# Patient Record
Sex: Male | Born: 1940 | Race: White | Hispanic: No | Smoking: Former smoker
Health system: Southern US, Community
[De-identification: ages and names within clinical notes are randomized; demographics above are authoritative.]

## PROBLEM LIST (undated history)

## (undated) DIAGNOSIS — N289 Disorder of kidney and ureter, unspecified: Secondary | ICD-10-CM

## (undated) DIAGNOSIS — I5030 Unspecified diastolic (congestive) heart failure: Secondary | ICD-10-CM

## (undated) DIAGNOSIS — E079 Disorder of thyroid, unspecified: Secondary | ICD-10-CM

## (undated) DIAGNOSIS — I739 Peripheral vascular disease, unspecified: Secondary | ICD-10-CM

## (undated) DIAGNOSIS — I499 Cardiac arrhythmia, unspecified: Secondary | ICD-10-CM

## (undated) DIAGNOSIS — F419 Anxiety disorder, unspecified: Secondary | ICD-10-CM

## (undated) DIAGNOSIS — E785 Hyperlipidemia, unspecified: Secondary | ICD-10-CM

## (undated) DIAGNOSIS — F32A Depression, unspecified: Secondary | ICD-10-CM

## (undated) DIAGNOSIS — C4490 Unspecified malignant neoplasm of skin, unspecified: Secondary | ICD-10-CM

## (undated) DIAGNOSIS — I10 Essential (primary) hypertension: Secondary | ICD-10-CM

## (undated) DIAGNOSIS — F329 Major depressive disorder, single episode, unspecified: Secondary | ICD-10-CM

## (undated) DIAGNOSIS — D649 Anemia, unspecified: Secondary | ICD-10-CM

## (undated) HISTORY — PX: AV FISTULA PLACEMENT: SHX1204

---

## 1998-07-08 ENCOUNTER — Encounter: Payer: Self-pay | Admitting: Emergency Medicine

## 1998-07-08 ENCOUNTER — Emergency Department (HOSPITAL_COMMUNITY): Admission: EM | Admit: 1998-07-08 | Discharge: 1998-07-08 | Payer: Self-pay | Admitting: Emergency Medicine

## 2004-06-20 ENCOUNTER — Encounter: Admission: RE | Admit: 2004-06-20 | Discharge: 2004-09-18 | Payer: Self-pay | Admitting: Family Medicine

## 2007-05-19 ENCOUNTER — Ambulatory Visit: Payer: Self-pay | Admitting: Surgery

## 2007-05-22 ENCOUNTER — Ambulatory Visit: Payer: Self-pay | Admitting: Vascular Surgery

## 2007-05-22 ENCOUNTER — Ambulatory Visit (HOSPITAL_COMMUNITY): Admission: RE | Admit: 2007-05-22 | Discharge: 2007-05-22 | Payer: Self-pay | Admitting: Vascular Surgery

## 2007-06-24 ENCOUNTER — Ambulatory Visit: Payer: Self-pay | Admitting: Vascular Surgery

## 2007-08-07 ENCOUNTER — Ambulatory Visit (HOSPITAL_COMMUNITY): Admission: RE | Admit: 2007-08-07 | Discharge: 2007-08-07 | Payer: Self-pay | Admitting: Nephrology

## 2007-08-14 ENCOUNTER — Ambulatory Visit: Payer: Self-pay | Admitting: *Deleted

## 2007-08-26 ENCOUNTER — Ambulatory Visit (HOSPITAL_COMMUNITY): Admission: RE | Admit: 2007-08-26 | Discharge: 2007-08-26 | Payer: Self-pay | Admitting: Vascular Surgery

## 2007-08-26 ENCOUNTER — Ambulatory Visit: Payer: Self-pay | Admitting: Vascular Surgery

## 2007-10-16 ENCOUNTER — Ambulatory Visit: Payer: Self-pay | Admitting: Vascular Surgery

## 2007-10-16 ENCOUNTER — Ambulatory Visit (HOSPITAL_COMMUNITY): Admission: RE | Admit: 2007-10-16 | Discharge: 2007-10-16 | Payer: Self-pay | Admitting: Vascular Surgery

## 2007-11-27 ENCOUNTER — Ambulatory Visit: Payer: Self-pay | Admitting: *Deleted

## 2007-12-02 ENCOUNTER — Ambulatory Visit: Payer: Self-pay | Admitting: Vascular Surgery

## 2007-12-02 ENCOUNTER — Ambulatory Visit (HOSPITAL_COMMUNITY): Admission: RE | Admit: 2007-12-02 | Discharge: 2007-12-02 | Payer: Self-pay | Admitting: Vascular Surgery

## 2008-02-05 ENCOUNTER — Ambulatory Visit: Payer: Self-pay | Admitting: *Deleted

## 2008-03-02 ENCOUNTER — Ambulatory Visit (HOSPITAL_COMMUNITY): Admission: RE | Admit: 2008-03-02 | Discharge: 2008-03-02 | Payer: Self-pay | Admitting: *Deleted

## 2008-03-02 ENCOUNTER — Ambulatory Visit: Payer: Self-pay | Admitting: *Deleted

## 2008-05-20 ENCOUNTER — Ambulatory Visit: Payer: Self-pay | Admitting: Vascular Surgery

## 2008-05-20 ENCOUNTER — Ambulatory Visit (HOSPITAL_COMMUNITY): Admission: RE | Admit: 2008-05-20 | Discharge: 2008-05-20 | Payer: Self-pay | Admitting: Vascular Surgery

## 2010-06-12 LAB — POCT I-STAT 4, (NA,K, GLUC, HGB,HCT)
Glucose, Bld: 124 mg/dL — ABNORMAL HIGH (ref 70–99)
HCT: 42 % (ref 39.0–52.0)
Potassium: 4.2 mEq/L (ref 3.5–5.1)
Sodium: 141 mEq/L (ref 135–145)

## 2010-06-27 ENCOUNTER — Ambulatory Visit (HOSPITAL_COMMUNITY): Payer: Self-pay | Attending: Nephrology

## 2010-07-11 ENCOUNTER — Ambulatory Visit (HOSPITAL_COMMUNITY): Payer: Self-pay | Attending: Nephrology

## 2010-07-11 NOTE — Procedures (Signed)
CEPHALIC VEIN MAPPING   INDICATION:  Evaluate cephalic vein for fistula.  Left hand dominant.   HISTORY:  ESRD.   EXAM:  The right cephalic vein is compressible.   Diameter measurements range from 24-55 cm.   The left cephalic vein not evaluated.   Diameter measurements range from   See attached worksheet for all measurements.   IMPRESSION:  Patent right cephalic vein which is of acceptable diameter  for use as a dialysis access site from the level of the distal forearm  to the proximal arm.   ___________________________________________  V. Charlena Cross, MD   PB/MEDQ  D:  05/19/2007  T:  05/19/2007  Job:  [id number]

## 2010-07-11 NOTE — Op Note (Signed)
NAMECLEOTHA, TSANG             ACCOUNT NO.:  [id number]   MEDICAL RECORD NO.:  [id number]          PATIENT TYPE:  AMB   LOCATION:  SDS                          FACILITY:  [facility]   PHYSICIAN:  Larina Earthly, M.D.    DATE OF BIRTH:  06-20-1940   DATE OF PROCEDURE:  08/26/2007  DATE OF DISCHARGE:  08/26/2007                               OPERATIVE REPORT   PREOPERATIVE DIAGNOSIS:  Right atriovenous fistula for hemodialysis  access with competing branches.   POSTOPERATIVE DIAGNOSIS:  Right atriovenous fistula for hemodialysis  access with competing branches.   PROCEDURE:  Ligation of 2 competing branches of right arm atriovenous  fistula.   SURGEON:  Larina Earthly, MD   ASSISTANT:  Nurse.   ANESTHESIA:  MAC.   COMPLICATIONS:  None.   DISPOSITION:  To recovery room, stable.   INDICATIONS FOR PROCEDURE:  The patient was taken to the operating room,  placed in supine position.  The right arm was prepped and draped in the  usual sterile fashion.  Using preoperative sonogram for guidance, the  competing branches were identified using duplex.  Using local  anesthesia, a  small 1-cm incision was made over the fistula at 2  different place and the competing venous branches were isolated.  These  were ligated with 3-0 silk ties.  The wounds were irrigated with saline.  Hemostasis with electrocautery.  The wounds were closed 3-0 Vicryl in  the subcutaneous and subcuticular tissue.  Benzoin and Steri-Strips were  applied.      Larina Earthly, M.D.  Electronically Signed     TFE/MEDQ  D:  08/26/2007  T:  08/27/2007  Job:  [id number]

## 2010-07-11 NOTE — Op Note (Signed)
NAMEGEOVANY, TRUDO             ACCOUNT NO.:  [id number]   MEDICAL RECORD NO.:  [id number]          PATIENT TYPE:  AMB   LOCATION:  SDS                          FACILITY:  [facility]   PHYSICIAN:  Quita Skye. Hart Rochester, M.D.  DATE OF BIRTH:  02-20-1941   DATE OF PROCEDURE:  10/16/2007  DATE OF DISCHARGE:  10/16/2007                               OPERATIVE REPORT   PREOPERATIVE DIAGNOSIS:  Migration of Diatek catheter, right internal  jugular vein with no infection.   POSTOPERATIVE DIAGNOSIS:  Migration of Diatek catheter, right internal  jugular vein with no infection.   OPERATIONS:  1. Removal of Diatek catheter, right internal jugular vein.  2. Insertion of new Diatek catheter, right internal jugular vein (28      cm).   SURGEON:  Quita Skye. Hart Rochester, M.D.   FIRST ASSISTANT:  Nurse.   ANESTHESIA:  Local.   PROCEDURE:  The patient was taken to the operating room and placed in a  supine position, at which time right upper chest and neck were prepped  with Betadine scrub and solution draped in routine sterile manner.  The  catheter which exited in the infraclavicular area on the right side was  also prepped into the field.  A portion of the cuff had become exposed  at the exit site, and the patient was to have this replaced to prevent  infection.  After infiltration with 1% Xylocaine, a short transverse  incision was made in the supraclavicular area over the catheter.  Catheter was then exposed and grasped with a hemostat.  The catheter  transected distally.  A guidewire passed centrally through the catheter  into the inferior vena cava under fluoroscopic guidance and the old  catheter removed over the guidewire.  A new 28-cm Diatek catheter was  passed through the peel-away sheath, positioned in the right atrium,  tunneled peripherally into a different area, secured with nylon sutures,  and the wound closed with Vicryl in subcuticular fashion.  Attention was  then turned to the exit  site, which was infiltrated with Xylocaine and  the cuff was completely exposed, and the remainder of the old catheter  completely removed including old cuff.  There was no evidence of any  infection.  After this, the exit site was closed in subcuticular fashion  with Vicryl.  Sterile dressing applied.  The patient was taken to the  recovery room in satisfactory condition.      Quita Skye Hart Rochester, M.D.  Electronically Signed     JDL/MEDQ  D:  10/16/2007  T:  10/17/2007  Job:  [id number]

## 2010-07-11 NOTE — Op Note (Signed)
Chris Vance, Chris Vance             ACCOUNT NO.:  [id number]   MEDICAL RECORD NO.:  [id number]          PATIENT TYPE:  AMB   LOCATION:  SDS                          FACILITY:  [facility]   PHYSICIAN:  Balinda Quails, M.D.    DATE OF BIRTH:  Sep 13, 1940   DATE OF PROCEDURE:  03/02/2008  DATE OF DISCHARGE:  03/02/2008                               OPERATIVE REPORT   SURGEON:  Balinda Quails, MD   ASSISTANT:  RNFA.   ANESTHESIA:  Local with MAC.   PREOPERATIVE DIAGNOSES:  1. End-stage renal failure.  2. Poorly maturing right Cimino arteriovenous fistula.   POSTOPERATIVE DIAGNOSES:  1. End-stage renal failure.  2. Poorly maturing right Cimino arteriovenous fistula.   PROCEDURE:  Revision of right Cimino arteriovenous fistula.   OPERATIVE PROCEDURE:  The patient was brought to the operating room in  stable condition.  Placed in supine position.  Right arm prepped and  draped in a sterile fashion.   Using a sterile ultrasound probe, right arm arteriovenous fistula was  imaged.  Two large draining veins were identified in the mid forearm  area.  Cut-down was performed after instillation of 1% Xylocaine.  Draining veins were ligated with 3-0 silk.   At completion, the fistula was patent.  The incisions were closed with  interrupted 3-0 Vicryl suture in the subcutaneous layer and Monocryl and  Dermabond to the skin.      Balinda Quails, M.D.  Electronically Signed     PGH/MEDQ  D:  03/02/2008  T:  03/03/2008  Job:  [id number]

## 2010-07-11 NOTE — Op Note (Signed)
Chris Vance, Chris Vance             ACCOUNT NO.:  [id number]   MEDICAL RECORD NO.:  [id number]          PATIENT TYPE:  AMB   LOCATION:  SDS                          FACILITY:  [facility]   PHYSICIAN:  Larina Earthly, M.D.    DATE OF BIRTH:  23-Nov-1940   DATE OF PROCEDURE:  05/22/2007  DATE OF DISCHARGE:                               OPERATIVE REPORT   PREOPERATIVE DIAGNOSIS:  End-stage renal disease.   POSTOPERATIVE DIAGNOSIS:  End-stage renal disease.   PROCEDURE:  1. Right wrist Cimino AV fistula.  2. Right IJ Diatek catheter with ultrasound visualization.   SURGEON:  Gretta Began, MD.   ASSISTANT:  Iva Boop, PA-C.   ANESTHESIA:  MAC.   COMPLICATIONS:  None.   DISPOSITION:  Recovery room stable.   PROCEDURE IN DETAIL:  The patient was taken to the recovery room, placed  in the supine position.  The area of the right arm, right wrist prepped  and draped in the usual sterile fashion.  An incision was made between  the level of the cephalic vein and the radial artery.  The cephalic vein  extended over to the dorsum of the wrist and the vein had a separate  small incision over the vein to mobilize this and expose this.  The vein  was ligated distally, divided and brought into approximation with the  radial artery through a separate incision at the wrist.  The radial  artery was occluded proximally and distally with an 11-blade and  extended with Potts scissors.  The cephalic vein was cut to the  appropriate dimensions, length and was spatulated and sewn end-to-side  of the artery with a #6-0 Prolene suture.  Clamps were removed and good  flow was noted.  The wounds were irrigated with saline.  Hemostasis with  electrocautery.  Wounds were closed with #3-0 Vicryl in the subcutaneous  and subcuticular tissue and Steri-Strips were applied.  Next, the right  and left neck were imaged with ultrasound, revealing widely patent  jugular veins bilaterally.  The patient was placed in  Trendelenburg  position and the right and left neck, chest prepped and draped in the  usual sterile fashion.  Using local anesthesia and a finder needle, the  right internal jugular vein was accessed.  Next, using the Seldinger  technique a guide wire was passed down to the level of the right atrium.  The dilator and peel-away sheath was passed over the guidewire.  A  dilator and peel-away sheath was placed and the dilator and guidewire  were removed.  The 28 cm Diatek catheter was passed through the vein to  the level of the right atrium as confirmed by fluoroscopy.  The catheter  was brought through a subcutaneous tunnel and the two lumen ports were  attached.  Both lumens flushed and aspirated easily and were locked with  1000 units of  heparin.  The catheter was secured to the skin with #3-0  nylon stitch.  The entry site was closed with #4-0 subcuticular Vicryl  stitch.  A sterile dressing was applied and the patient was taken to the  recovery room in stable condition.      Larina Earthly, M.D.  Electronically Signed     TFE/MEDQ  D:  05/22/2007  T:  05/22/2007  Job:  [id number]

## 2010-07-11 NOTE — Assessment & Plan Note (Signed)
OFFICE VISIT   [name], [name]  DOB:  May 23, 1940                                       06/24/2007  AVWUJ#:[id number]   The patient underwent creation of a right forearm AV fistula (Cimino) by  Dr. Arbie Cookey on March 26.  The fistula is functioning nicely with an  excellent pulse and palpable thrill up to the antecubital area.  There  is no symptoms of steal.  He has some mild numbness in the thumb which  is improving.  The Diatek catheter is functioning well in the right  internal jugular vein.  He was reassured regarding this and in 2 months  they will attempt to utilize the fistula to see if it is functional for  dialysis.   Quita Skye Hart Rochester, M.D.  Electronically Signed   JDL/MEDQ  D:  06/24/2007  T:  06/25/2007  Job:  [id number]

## 2010-07-11 NOTE — Procedures (Signed)
VASCULAR LAB EXAM   INDICATION:  Follow up immature AV fistula.   HISTORY:  Diabetes:  Yes.  Cardiac:  No.  Hypertension:  Yes.   EXAM:  Duplex of the right AV fistula.   IMPRESSION:  Three branches noted off the right cephalic vein (AV  fistula).   ___________________________________________  P. Liliane Bade, M.D.   MG/MEDQ  D:  02/05/2008  T:  02/05/2008  Job:  [id number]

## 2010-07-11 NOTE — Op Note (Signed)
NAME:  Chris Vance, Chris Vance             ACCOUNT NO.:  [id number]   MEDICAL RECORD NO.:  [id number]          PATIENT TYPE:  AMB   LOCATION:  SDS                          FACILITY:  [facility]   PHYSICIAN:  Larina Earthly, M.D.    DATE OF BIRTH:  June 23, 1940   DATE OF PROCEDURE:  DATE OF DISCHARGE:                               OPERATIVE REPORT   PREOPERATIVE DIAGNOSIS:  Poorly maturing right arteriovenous fistula  with stenosis at arteriovenous anastomosis.   POSTOPERATIVE DIAGNOSIS:  Poorly maturing right arteriovenous fistula  with stenosis at arteriovenous anastomosis.   PROCEDURE:  1. Revision of arteriovenous anastomosis of right wrist AV fistula.  2. Ligation of competing branch of AV fistula.   SURGEON:  Larina Earthly, MD   ASSISTANT:  Nurse.   ANESTHESIA:  MAC.   COMPLICATIONS:  None.   DISPOSITION:  To recovery room, stable.   PROCEDURE IN DETAIL:  The patient was taken to the operating room,  placed in supine position.  The area of the right arm was prepped and  draped in a sterile fashion.  The patient had a preoperative ultrasound,  which showed a severe stenosis at the AV anastomosis.  Using local  anesthesia, incision was made to the prior incision at the wrist,  carried down to dissect the AV anastomosis.  The radial artery was  exposed further proximally and the old AV anastomosis was ligated near  the arteriovenous fistula.  The cephalic vein was divided.  The radial  artery was occluded proximal and distally, slightly proximal to the  prior AV anastomosis and the artery was opened with #11 blade extended  longitudinally using Potts scissors.  The cephalic vein was spatulated  and sewn end-to-side to the artery with a running 6-0 Prolene suture.  Clamps were removed and good thrill was noted through the vein.  Next,  the vein was reimaged with ultrasound and this did show a branch in the  midforearm with some flow into the tributary branch.  The incision was  made  over this with local anesthesia and the competing branch was  ligated with 3-0 silk tie.  The wounds were irrigated with saline.  Hemostasis achieved with cautery.  Wounds were closed with 3-0 Vicryl  subcutaneous and subcuticular tissues.  Benzoin and Steri-Strips were  applied.     Larina Earthly, M.D.  Electronically Signed    TFE/MEDQ  D:  12/02/2007  T:  12/02/2007  Job:  [id number]

## 2010-07-11 NOTE — Assessment & Plan Note (Signed)
OFFICE VISIT   Chris Vance, Chris Vance  DOB:  [birth year]                                       05/19/2007  YNWGN#:[id number]   REASON FOR VISIT:  Evaluate for dialysis access.   HISTORY:  This is a 70 year old gentleman seen at the request of Dr.  Hyman Hopes for evaluation of permanent dialysis access.  The patient has a  history of diabetes, hypertension as well as hypercholesterolemia.  He  is left-handed.   REVIEW OF SYSTEMS:  GENERAL:  Positive for loss of appetite.  CARDIAC:  Negative.  PULMONARY:  Negative.  GI:  Positive for reflux.  GU:  Positive for kidney disease and frequent urination.  VASCULAR:  Pain in legs with walking and when lying flat.  NEURO:  Positive for dizziness.  ORTHO:  Negative.  PSYCH:  Positive for depression.  ENT:  Negative.  HEME:  Negative.   PAST MEDICAL HISTORY:  1. Diabetes.  2. Hypertension.  3. Hypercholesterolemia.   FAMILY HISTORY:  Negative.   SOCIAL HISTORY:  1. He is single.  2. Currently he smokes about a 1 pack a day.  3. Does not drink alcohol.   PHYSICAL EXAMINATION:  Heart rate 69, blood pressure 120/62,  respirations 18.  General:  He is well-appearing, in no acute distress.  HEENT:  Normocephalic, atraumatic.  Cardiovascular:  Regular rate and  rhythm.  Pulmonary:  Clear to auscultation.  Extremities:  His right arm  has a palpable radial pulse.  Extremities:  Warm and well perfused.  Psych:  He is alert and oriented x3.   Vein mapping was performed today.  I think he has adequate right  systolic vein.   ASSESSMENT AND PLAN:  This is a 70 year old left-handed gentleman with  chronic kidney disease soon to require dialysis.  He comes in for  dialysis mapping, for dialysis planning.  I think he is a candidate for  a right cephalic vein fistula at the level of the wrist.  I discussed  with him approximately 80% maturity rate.  We discussed the risks and  benefits of the operation including steal,  infection, bleeding.  Patient  is going to call us with his OR time.   Jorge Ny, MD  Electronically Signed   VWB/MEDQ  D:  05/19/2007  T:  05/20/2007  Job:  [id number]   cc:   Garnetta Buddy, M.D.

## 2010-07-11 NOTE — Procedures (Signed)
VASCULAR LAB EXAM   INDICATION:  Right Cimino fistula placed 05/22/2007 by Dr. Arbie Cookey.  Two  branches of the cephalic vein were ligated on 16/11/9602 and yet the  fistula still failed to mature.   HISTORY:  Diabetes:  Yes.  Cardiac:  No.  Hypertension:  Yes.   EXAM:  Duplex of right Cimino fistula.   IMPRESSION:  1. No obvious right brachial/radial inflow stenosis.  2. Peak systolic velocity of 927 cm/second at the fistula.  3. There is a large branch of the cephalic vein at the antecubital      fossa that courses medially and communicates with the deep system.  4. The mid upper arm cephalic vein measures 0.47 cm in diameter.  5. Velocities suggest severe stenosis at the anastomosis of the      fistula.   ___________________________________________  P. Liliane Bade, M.D.   MC/MEDQ  D:  11/27/2007  T:  11/27/2007  Job:  [id number]

## 2010-07-11 NOTE — Assessment & Plan Note (Signed)
OFFICE VISIT   Chris Vance, Chris Vance  DOB:  19-Aug-1940                                       11/27/2007  JWJXB#:[id number]   The patient is an end-stage renal failure patient on hemodialysis  Monday, Wednesday and Friday.  He had a right Cimino AV fistula  initially placed 05/22/2007 with revision of the fistula 08/26/2007 both  procedures carried out by Dr. Arbie Cookey.  The fistula has failed to mature.  Duplex scan reveals a severe stenosis at the arteriovenous anastomosis  with velocities of 927 cm/sec.   Blood pressure is 173/78, pulse is 80 per minute.  The fistula is  patent, although not adequately matured.   Will schedule for revision of the arteriovenous anastomosis as the vein  does appear adequate on duplex and with improved inflow should mature  well.   Balinda Quails, M.D.  Electronically Signed   PGH/MEDQ  D:  11/27/2007  T:  11/29/2007  Job:  [id number]

## 2010-07-11 NOTE — Assessment & Plan Note (Signed)
OFFICE VISIT   Chris Vance, Chris Vance  DOB:  August 10, 1940                                       02/05/2008  [id number]#:[id number]   The patient is an end-stage renal failure patient with hemodialysis  Monday, Wednesday, Friday.  Right Cimino AV fistula initially created  05/22/2007 with revision of fistula 08/26/2007 and 12/02/2007.  Continues to have some failure to mature.   Duplex scan reveals a competing vein in the right mid forearm and also a  large vein draining from the cephalic to the basilic in the antecubital  fossa.   Will plan revision 03/02/2008 at [facility].   Balinda Quails, M.D.  Electronically Signed   PGH/MEDQ  D:  02/05/2008  T:  02/06/2008  Job:  [id number]

## 2010-07-11 NOTE — Assessment & Plan Note (Signed)
OFFICE VISIT   Chris Vance, Chris Vance  DOB:  [birth year]                                       08/14/2007  OZHYQ#:[id number]   The patient is a 70 year old gentleman with a history of diabetes,  hypertension and end-stage renal failure.  He has a right internal  jugular Diatek catheter in place for hemodialysis Monday, Wednesday and  Friday.  He had a right Cimino arteriovenous fistula created by Dr.  Arbie Cookey on 05/22/2007.  This, however, has failed to mature adequately for  hemodialysis.   He underwent a shuntogram on 08/07/2007 and on review of this there are  several competing veins noted in the distal right forearm.  It does  appear that with ligation of these there may be significant improvement  in the flow into the main cephalic vein of the fistula.  The patient  takes no anticoagulants.   MEDICATIONS:  His medications include insulin, Prilosec, labetalol,  amlodipine, PhosLo, Dialyvite and lisinopril.   PHYSICAL EXAMINATION:  General:  On evaluation he is a generally well-  appearing 70 year old male.  No acute distress.  Vital signs:  BP is  176/80, pulse 82 per minute.  His right Cimino arteriovenous fistula is  patent.  The areas of competing veins can actually be palpated fairly  easily in his right forearm.   With dialysis Monday, Wednesday, Friday I will set him up for Dr. Arbie Cookey  for a revision of his AV fistula to be performed 08/26/2007 at [facility].  Please put a copy of this dictation on Dr. Bosie Helper desk.   Balinda Quails, M.D.  Electronically Signed   PGH/MEDQ  D:  08/14/2007  T:  08/15/2007  Job:  [id number]

## 2010-11-20 LAB — POCT I-STAT 4, (NA,K, GLUC, HGB,HCT)
Glucose, Bld: 74
HCT: 29 — ABNORMAL LOW
Hemoglobin: 9.9 — ABNORMAL LOW
Potassium: 3.7
Sodium: 142

## 2010-11-23 LAB — POCT I-STAT 4, (NA,K, GLUC, HGB,HCT): Hemoglobin: 15

## 2010-11-27 LAB — POCT I-STAT 4, (NA,K, GLUC, HGB,HCT)
HCT: 40
Hemoglobin: 13.6
Potassium: 4.5
Sodium: 135

## 2010-11-29 ENCOUNTER — Emergency Department (HOSPITAL_COMMUNITY): Payer: Medicare Other

## 2010-11-29 ENCOUNTER — Emergency Department (HOSPITAL_COMMUNITY)
Admission: EM | Admit: 2010-11-29 | Discharge: 2010-11-29 | Disposition: A | Discharge status: Home / Self Care | Payer: Medicare Other | Attending: Emergency Medicine | Admitting: Emergency Medicine

## 2010-11-29 DIAGNOSIS — Z794 Long term (current) use of insulin: Secondary | ICD-10-CM | POA: Insufficient documentation

## 2010-11-29 DIAGNOSIS — R5383 Other fatigue: Secondary | ICD-10-CM | POA: Insufficient documentation

## 2010-11-29 DIAGNOSIS — R5381 Other malaise: Secondary | ICD-10-CM | POA: Insufficient documentation

## 2010-11-29 DIAGNOSIS — G510 Bell's palsy: Secondary | ICD-10-CM | POA: Insufficient documentation

## 2010-11-29 DIAGNOSIS — Z79899 Other long term (current) drug therapy: Secondary | ICD-10-CM | POA: Insufficient documentation

## 2010-11-29 DIAGNOSIS — R29810 Facial weakness: Secondary | ICD-10-CM | POA: Insufficient documentation

## 2010-11-29 LAB — COMPREHENSIVE METABOLIC PANEL
ALT: 15 U/L (ref 0–53)
AST: 15 U/L (ref 0–37)
CO2: 34 mEq/L — ABNORMAL HIGH (ref 19–32)
Chloride: 94 mEq/L — ABNORMAL LOW (ref 96–112)
Creatinine, Ser: 3.21 mg/dL — ABNORMAL HIGH (ref 0.50–1.35)
GFR calc non Af Amer: 18 mL/min — ABNORMAL LOW (ref >90)
Total Bilirubin: 0.4 mg/dL (ref 0.3–1.2)

## 2010-11-29 LAB — DIFFERENTIAL
Basophils Absolute: 0 10*3/uL (ref 0.0–0.1)
Basophils Relative: 1 % (ref 0–1)
Eosinophils Relative: 2 % (ref 0–5)
Monocytes Absolute: 0.4 10*3/uL (ref 0.1–1.0)
Monocytes Relative: 6 % (ref 3–12)

## 2010-11-29 LAB — CBC
HCT: 28.7 % — ABNORMAL LOW (ref 39.0–52.0)
MCH: 31.4 pg (ref 26.0–34.0)
MCHC: 36.2 g/dL — ABNORMAL HIGH (ref 30.0–36.0)
RDW: 17.4 % — ABNORMAL HIGH (ref 11.5–15.5)

## 2010-11-30 ENCOUNTER — Other Ambulatory Visit: Payer: Self-pay | Admitting: Nephrology

## 2010-11-30 DIAGNOSIS — G510 Bell's palsy: Secondary | ICD-10-CM

## 2010-11-30 DIAGNOSIS — R270 Ataxia, unspecified: Secondary | ICD-10-CM

## 2010-12-05 ENCOUNTER — Ambulatory Visit
Admission: RE | Admit: 2010-12-05 | Discharge: 2010-12-05 | Disposition: A | Discharge status: Home / Self Care | Payer: Medicare Other | Source: Ambulatory Visit | Attending: Nephrology | Admitting: Nephrology

## 2010-12-05 DIAGNOSIS — G510 Bell's palsy: Secondary | ICD-10-CM

## 2010-12-05 DIAGNOSIS — R270 Ataxia, unspecified: Secondary | ICD-10-CM

## 2011-06-29 ENCOUNTER — Other Ambulatory Visit: Payer: Self-pay | Admitting: Neurology

## 2011-06-29 DIAGNOSIS — H4920 Sixth [abducent] nerve palsy, unspecified eye: Secondary | ICD-10-CM

## 2011-06-29 DIAGNOSIS — G510 Bell's palsy: Secondary | ICD-10-CM

## 2011-06-29 DIAGNOSIS — H8309 Labyrinthitis, unspecified ear: Secondary | ICD-10-CM

## 2011-10-08 ENCOUNTER — Emergency Department (HOSPITAL_COMMUNITY)
Admission: EM | Admit: 2011-10-08 | Discharge: 2011-10-08 | Disposition: A | Discharge status: Home / Self Care | Payer: Medicare Other | Attending: Emergency Medicine | Admitting: Emergency Medicine

## 2011-10-08 ENCOUNTER — Encounter (HOSPITAL_COMMUNITY): Payer: Self-pay | Admitting: *Deleted

## 2011-10-08 ENCOUNTER — Emergency Department (HOSPITAL_COMMUNITY): Payer: Medicare Other

## 2011-10-08 DIAGNOSIS — I120 Hypertensive chronic kidney disease with stage 5 chronic kidney disease or end stage renal disease: Secondary | ICD-10-CM | POA: Insufficient documentation

## 2011-10-08 DIAGNOSIS — R569 Unspecified convulsions: Secondary | ICD-10-CM

## 2011-10-08 DIAGNOSIS — Z79899 Other long term (current) drug therapy: Secondary | ICD-10-CM | POA: Insufficient documentation

## 2011-10-08 DIAGNOSIS — Z794 Long term (current) use of insulin: Secondary | ICD-10-CM | POA: Insufficient documentation

## 2011-10-08 DIAGNOSIS — F411 Generalized anxiety disorder: Secondary | ICD-10-CM | POA: Insufficient documentation

## 2011-10-08 DIAGNOSIS — E785 Hyperlipidemia, unspecified: Secondary | ICD-10-CM | POA: Insufficient documentation

## 2011-10-08 DIAGNOSIS — C4490 Unspecified malignant neoplasm of skin, unspecified: Secondary | ICD-10-CM | POA: Insufficient documentation

## 2011-10-08 DIAGNOSIS — D649 Anemia, unspecified: Secondary | ICD-10-CM | POA: Insufficient documentation

## 2011-10-08 DIAGNOSIS — N186 End stage renal disease: Secondary | ICD-10-CM | POA: Insufficient documentation

## 2011-10-08 DIAGNOSIS — F17200 Nicotine dependence, unspecified, uncomplicated: Secondary | ICD-10-CM | POA: Insufficient documentation

## 2011-10-08 DIAGNOSIS — F419 Anxiety disorder, unspecified: Secondary | ICD-10-CM | POA: Insufficient documentation

## 2011-10-08 HISTORY — DX: Essential (primary) hypertension: I10

## 2011-10-08 HISTORY — DX: Disorder of thyroid, unspecified: E07.9

## 2011-10-08 HISTORY — DX: Unspecified malignant neoplasm of skin, unspecified: C44.90

## 2011-10-08 HISTORY — DX: Anemia, unspecified: D64.9

## 2011-10-08 HISTORY — DX: Hyperlipidemia, unspecified: E78.5

## 2011-10-08 HISTORY — DX: Disorder of kidney and ureter, unspecified: N28.9

## 2011-10-08 HISTORY — DX: Anxiety disorder, unspecified: F41.9

## 2011-10-08 LAB — BASIC METABOLIC PANEL
Calcium: 9 mg/dL (ref 8.4–10.5)
GFR calc non Af Amer: 14 mL/min — ABNORMAL LOW (ref >90)
Sodium: 138 mEq/L (ref 135–145)

## 2011-10-08 LAB — CBC WITH DIFFERENTIAL/PLATELET
Basophils Absolute: 0 10*3/uL (ref 0.0–0.1)
Eosinophils Absolute: 0.1 10*3/uL (ref 0.0–0.7)
Eosinophils Relative: 1 % (ref 0–5)
MCH: 31.1 pg (ref 26.0–34.0)
MCV: 87.2 fL (ref 78.0–100.0)
Platelets: 240 10*3/uL (ref 150–400)
RDW: 14.6 % (ref 11.5–15.5)

## 2011-10-08 NOTE — ED Notes (Signed)
Pt was outside waiting for ride after finishing hemodialysis. Witnessed grand mal seizure lasting approx 1 min while in a sitting position. No injuries. Pt A&OX4 upon EMS arrival.

## 2011-10-08 NOTE — ED Notes (Signed)
Spoke with Velna Hatchet at [facility] who was present for some of incident. She reported that bystanders ran in to building stating that pt was having a seizure & shaking all over. Upon arrival outside of building she did not see any seizure activity, pt was then assisted to ground from bench. She stated that pt was post ictal, that he was unresponsive, not speaking, not following commands & was drooling which lasted 6-7 minutes. He then "came to", was talking but was confused. She stated that pt was only 2.5 over his dry weight (usually 4) therefore did not pull as much from him as usual. She also reported that his initial BP was 170/100

## 2011-10-08 NOTE — ED Notes (Addendum)
Reports after dialysis felt weak, but states always does. States he sat on bench, smoked a cigarette (always does) then passed out. Was reported to pt that he slumped over on bench & was assisted down to groung & seizure like activity lasted approx 15 seconds. No oral trauma, no urinary incontinence. Denies heart racing, palpitations, CP or any injuries. No post ictal phase reported.

## 2011-10-08 NOTE — ED Notes (Signed)
Pt arrived via EMS with dialysis graft accessed by kidney center. Was clamped off PTA. IV team paged & notified of need to de-access graft. Spoke with Trula Ore, RN with IV team

## 2011-10-08 NOTE — ED Notes (Signed)
Pt d/c home with instructions to follow up with Neurologist for further testing. Pt reports feeling weak due to dialysis earlier today. Denies pain. Denies SOB. A.O. X 4. Skin warm, dry, and intact. No further questions at this time. NAD.

## 2011-10-08 NOTE — ED Notes (Signed)
Patient transported to CT 

## 2011-10-08 NOTE — ED Notes (Signed)
IV team at bedside to access dialysis port.

## 2011-10-08 NOTE — ED Notes (Signed)
Pt. Sitting up in bed resting. Family at bedside. Denies pain, dizziness, SOB. Reports weakness, "I also feel weak after dialysis. This is nothing different". A.O. X 4. NAD. No further needs at this time.

## 2011-10-08 NOTE — ED Provider Notes (Signed)
History     CSN: [id number]  Arrival date & time 10/08/11  1657   First MD Initiated Contact with Patient 10/08/11 1718      Chief Complaint  Patient presents with  . Seizures    (Consider location/radiation/quality/duration/timing/severity/associated sxs/prior treatment) HPI Comments: Chris Vance is a 71 y.o. Male who is here for evaluation of possible seizure. The patient had completed dialysis, was sitting on bench, and slumped over with a shaking of his whole body. Bystanders assisted him to the ground. EMS arrived, and transported him here. EMS gave report that bystanders report 60 seconds of shaking, followed by immediate resolution of all symptoms, including return of consciousness. He had been unconscious while shaking. The patient remembers awakening on the ground with people standing over him. He did not bite his tongue or lose continence of urine, and bowels. Apparently, the dialysis nurse, accessed his right forearm fistula, after the event; and, possibly gave him fluids.  He has had no recent illnesses. He ate breakfast prior to dialysis today. He has never had a seizure. He had an evaluation 2 months ago for a possible stroke. He was told that he did not have a stroke. There are no aggravating or palliative factors.  Patient is a 71 y.o. male presenting with seizures. The history is provided by the patient.  Seizures     Past Medical History  Diagnosis Date  . Renal disorder   . Diabetes mellitus   . Hypertension   . Anemia   . Hyperlipidemia   . Thyroid disease   . Anxiety   . Skin cancer     History reviewed. No pertinent past surgical history.  History reviewed. No pertinent family history.  History  Substance Use Topics  . Smoking status: Current Everyday Smoker  . Smokeless tobacco: Not on file  . Alcohol Use: No      Review of Systems  Neurological: Positive for seizures.  All other systems reviewed and are negative.    Allergies  Ace  inhibitors and Lisinopril  Home Medications   Current Outpatient Rx  Name Route Sig Dispense Refill  . ALBUTEROL SULFATE HFA 108 (90 BASE) MCG/ACT IN AERS Inhalation Inhale 2 puffs into the lungs every 6 (six) hours as needed. For wheezing/shortness of breath    . AMLODIPINE BESYLATE 10 MG PO TABS Oral Take 10 mg by mouth at bedtime.    . ASPIRIN EC 81 MG PO TBEC Oral Take 81 mg by mouth daily.    Marland Kitchen DIALYVITE 3000 3 MG PO TABS Oral Take 1 tablet by mouth daily.    . FOSINOPRIL SODIUM 40 MG PO TABS Oral Take 40 mg by mouth at bedtime.    . INSULIN GLARGINE 100 UNIT/ML [place] SOLN Subcutaneous Inject 58 Units into the skin at bedtime.    Marland Kitchen LABETALOL HCL 200 MG PO TABS Oral Take 200 mg by mouth 2 (two) times daily.    Marland Kitchen LIDOCAINE-PRILOCAINE 2.5-2.5 % EX CREA Topical Apply 1 application topically 3 (three) times a week. Dialysis med    . OMEPRAZOLE 20 MG PO CPDR Oral Take 20 mg by mouth daily.    Marland Kitchen POLYVINYL ALCOHOL 1.4 % OP SOLN Both Eyes Place 1 drop into both eyes 2 (two) times daily.    Marland Kitchen PREDNISONE 10 MG PO TABS Oral Take 10 mg by mouth daily.    Marland Kitchen ROPINIROLE HCL 0.25 MG PO TABS Oral Take 0.25 mg by mouth at bedtime.      BP  153/58  Pulse 88  Temp 97.9 F (36.6 C) (Oral)  Resp 16  SpO2 99%  Physical Exam  Nursing note and vitals reviewed. Constitutional: He is oriented to person, place, and time. He appears well-developed and well-nourished.  HENT:  Head: Normocephalic and atraumatic.  Right Ear: External ear normal.  Left Ear: External ear normal.       No tongue abrasion  Eyes: Conjunctivae and EOM are normal. Pupils are equal, round, and reactive to light.  Neck: Normal range of motion and phonation normal. Neck supple.  Cardiovascular: Normal rate, regular rhythm, normal heart sounds and intact distal pulses.        Right forearm fistula, with normal thrill,  Pulmonary/Chest: Effort normal and breath sounds normal. He exhibits no bony tenderness.  Abdominal: Soft. Normal  appearance. There is no tenderness.  Musculoskeletal: Normal range of motion. He exhibits no edema and no tenderness.  Neurological: He is alert and oriented to person, place, and time. He has normal strength. No cranial nerve deficit or sensory deficit. He exhibits normal muscle tone. Coordination normal.  Skin: Skin is warm, dry and intact.  Psychiatric: He has a normal mood and affect. His behavior is normal. Judgment and thought content normal.    ED Course  Procedures (including critical care time)  Emergency Department nurse contacted the nurse, who evaluated the patient outside of his dialysis unit. She found him immediately after the shaking stopped, he was postictal for 7 or 8 minutes. His blood pressure was hypertensive. She did not notice any trauma. He had actually had a shortened dialysis today, because he did not need a lot of fluid  taken off.  Reeval: 23:25- patient is comfortable. Repeat vital signs, normal. No seizure activity, emergency department. He is calm, cooperative, and lucid.    Date: 10/08/2011  Rate: 72  Rhythm: normal sinus rhythm  QRS Axis: normal  Intervals: normal  ST/T Wave abnormalities: normal  Conduction Disutrbances:nonspecific intraventricular conduction delay  Narrative Interpretation:   Old EKG Reviewed: unchanged     Labs Reviewed  CBC WITH DIFFERENTIAL - Abnormal; Notable for the following:    WBC 11.4 (*)     RBC 3.60 (*)     Hemoglobin 11.2 (*)     HCT 31.4 (*)     Neutrophils Relative 82 (*)     Neutro Abs 9.3 (*)     Lymphocytes Relative 9 (*)     All other components within normal limits  BASIC METABOLIC PANEL - Abnormal; Notable for the following:    Chloride 93 (*)     Glucose, Bld 122 (*)     Creatinine, Ser 4.06 (*)     GFR calc non Af Amer 14 (*)     GFR calc Af Amer 16 (*)     All other components within normal limits   Ct Head Wo Contrast  10/08/2011  *RADIOLOGY REPORT*  Clinical Data: Syncope, seizures.  CT HEAD  WITHOUT CONTRAST  Technique:  Contiguous axial images were obtained from the base of the skull through the vertex without contrast.  Comparison: 11/29/2010  Findings: Atherosclerotic and physiologic intracranial calcifications.  Retention cyst or polyp in the left maxillary sinus. Diffuse parenchymal atrophy. Patchy areas of hypoattenuation in deep and periventricular white matter bilaterally. Negative for acute intracranial hemorrhage, mass lesion, acute infarction, midline shift, or mass-effect. Acute infarct may be inapparent on noncontrast CT. Ventricles and sulci symmetric. Bone windows demonstrate no focal lesion.  IMPRESSION:  1. Negative for bleed  or other acute intracranial process.  2. Atrophy and nonspecific white matter changes  Original Report Authenticated By: Thora Lance III, M.D.     1. Seizure       MDM  End stage renal patient presents with normal to elevated blood pressure, and history of loss of consciousness, with possible shaking. Bystander history is reasonable for seizure. No history of epilepsy or evident propagating physical or lab findings. Patient stable for discharge with outpatient management.       Plan: Home Medications- usual; Home Treatments- rest, avoid stress; Recommended follow up- Neurology    Flint Melter, MD 10/08/11 [phone]

## 2011-10-16 ENCOUNTER — Ambulatory Visit (HOSPITAL_COMMUNITY)
Admission: RE | Admit: 2011-10-16 | Discharge: 2011-10-16 | Disposition: A | Discharge status: Home / Self Care | Payer: Medicare Other | Source: Ambulatory Visit | Attending: Nephrology | Admitting: Nephrology

## 2011-10-16 DIAGNOSIS — E119 Type 2 diabetes mellitus without complications: Secondary | ICD-10-CM | POA: Insufficient documentation

## 2011-10-16 DIAGNOSIS — F17200 Nicotine dependence, unspecified, uncomplicated: Secondary | ICD-10-CM | POA: Insufficient documentation

## 2011-10-16 DIAGNOSIS — I10 Essential (primary) hypertension: Secondary | ICD-10-CM | POA: Insufficient documentation

## 2011-10-16 DIAGNOSIS — E785 Hyperlipidemia, unspecified: Secondary | ICD-10-CM | POA: Insufficient documentation

## 2011-10-16 DIAGNOSIS — R031 Nonspecific low blood-pressure reading: Secondary | ICD-10-CM | POA: Insufficient documentation

## 2011-10-16 NOTE — Progress Notes (Signed)
*  PRELIMINARY RESULTS* Echocardiogram 2D Echocardiogram has been performed.  Jeryl Columbia 10/16/2011, 12:03 PM

## 2012-06-05 ENCOUNTER — Ambulatory Visit (INDEPENDENT_AMBULATORY_CARE_PROVIDER_SITE_OTHER)
Admission: RE | Admit: 2012-06-05 | Discharge: 2012-06-05 | Disposition: A | Discharge status: Home / Self Care | Payer: Medicare Other | Source: Ambulatory Visit | Attending: Internal Medicine | Admitting: Internal Medicine

## 2012-06-05 ENCOUNTER — Encounter: Payer: Self-pay | Admitting: Internal Medicine

## 2012-06-05 ENCOUNTER — Ambulatory Visit (INDEPENDENT_AMBULATORY_CARE_PROVIDER_SITE_OTHER): Payer: Medicare Other | Admitting: Internal Medicine

## 2012-06-05 VITALS — BP 142/64 | HR 82 | Temp 98.3°F | Ht 70.0 in | Wt 175.6 lb

## 2012-06-05 DIAGNOSIS — F17200 Nicotine dependence, unspecified, uncomplicated: Secondary | ICD-10-CM

## 2012-06-05 DIAGNOSIS — R0600 Dyspnea, unspecified: Secondary | ICD-10-CM

## 2012-06-05 DIAGNOSIS — R0609 Other forms of dyspnea: Secondary | ICD-10-CM

## 2012-06-05 DIAGNOSIS — R0989 Other specified symptoms and signs involving the circulatory and respiratory systems: Secondary | ICD-10-CM

## 2012-06-05 NOTE — Assessment & Plan Note (Addendum)
-   06/05/2012  Walked RA x 2 laps @ 185 ft each stopped due to sob but no desat - Spirometry 06/05/2012  FEV1 1.71 (50%) ratio 74  So he has no evidence of airflow obst and symptoms are markedly disproportionate to objective findings and not clear this is a lung problem but pt does appear to have difficult airway management issues.   DDX of  difficult airways managment all start with A and  include Adherence, Ace Inhibitors, Acid Reflux, Active Sinus Disease, Alpha 1 Antitripsin deficiency, Anxiety masquerading as Airways dz,  ABPA,  allergy(esp in young), Aspiration (esp in elderly), Adverse effects of DPI,  Active smokers, plus two Bs  = Bronchiectasis and Beta blocker use....and one C= CHF   ? Acei effects > he's better since d/c in fact his cc changed from cough at time of referral so sob by time of ov   ? Beta blocker effect > in absence of airflow obst clinically or by pft's it's prob ok to continue labetolol  ? Chf/ vol overload not supported by cxr or fact that Sundays are not his worse days    .

## 2012-06-05 NOTE — Patient Instructions (Addendum)
The key is to stop smoking completely before smoking completely stops you!   I will communicate with Dr Gerri Lins re your blood pressure medications as your normodyne (labetolol) may need to be changed at some point in the future but for now it's ok to continue as you do not appear to have significant copd or asthma but if your breathing worsens it may need to be changed   Please remember to go to the   x-ray department downstairs for your tests - we will call you with the results when they are available.

## 2012-06-05 NOTE — Progress Notes (Signed)
Quick Note:  Spoke with pt and notified of results per Dr. Wert. Pt verbalized understanding and denied any questions.  ______ 

## 2012-06-05 NOTE — Progress Notes (Signed)
  Subjective:    Patient ID: Chris Vance, male    DOB: [birth year] MRN: [id number]  HPI  18 yowm active smoker with esrf HD dep since 2009  referred by Dr Pearletha Forge for new onset cough/sob x 2012.   06/05/2012 1st pulmonary eval/ [name] cc doe x sev years worse on off days from HD but HD is MWF and Sundays are not his worst days.  Doe x unloading groceries and mailbox and back always recovers sitting still, plus attacks at hs ? better on days when has hd - no better p saba.  Was having a lot of cough but better since acei d/c'd by Dr C.  No obvious daytime variabilty or assoc  cp or chest tightness, subjective wheeze overt sinus or hb symptoms. No unusual exp hx or h/o childhood pna/ asthma or premature birth to his knowledge.   Also denies any obvious fluctuation of symptoms with weather or environmental changes or other aggravating or alleviating factors except as outlined above   Review of Systems  Constitutional: Negative for fever, chills, activity change, appetite change and unexpected weight change.  HENT: Negative for congestion, sore throat, rhinorrhea, sneezing, trouble swallowing, dental problem, voice change and postnasal drip.   Eyes: Negative for visual disturbance.  Respiratory: Positive for shortness of breath. Negative for cough and choking.   Cardiovascular: Negative for chest pain and leg swelling.  Gastrointestinal: Negative for nausea, vomiting and abdominal pain.  Genitourinary: Negative for difficulty urinating.  Musculoskeletal: Negative for arthralgias.  Skin: Negative for rash.  Psychiatric/Behavioral: Negative for behavioral problems and confusion.       Objective:   Physical Exam  amb wf with classic voice fatigue.   Wt Readings from Last 3 Encounters:  06/05/12 175 lb 9.6 oz (79.652 kg)  HEENT: nl dentition, turbinates, and orophanx. Nl external ear canals without cough reflex   NECK :  without JVD/Nodes/TM/ nl carotid upstrokes  bilaterally   LUNGS: no acc muscle use, clear to A and P bilaterally without cough on insp or exp maneuvers   CV:  RRR  no s3 or murmur or increase in P2, no edema   ABD:  soft and nontender with nl excursion in the supine position. No bruits or organomegaly, bowel sounds nl  MS:  warm without deformities, calf tenderness, cyanosis or clubbing/ shunt R forearm  SKIN: warm and dry without lesions    NEURO:  alert, approp, no deficits    CXR  06/05/2012 :  No acute finding. Stable compared prior exam.        Assessment & Plan:

## 2012-06-05 NOTE — Assessment & Plan Note (Signed)
I took an extended  opportunity with this patient to outline the consequences of continued cigarette use  in airway disorders based on all the data we have from the multiple national lung health studies (perfomed over decades at millions of dollars in cost)  indicating that smoking cessation, not choice of inhalers or physicians, is the most important aspect of care.   

## 2012-10-09 ENCOUNTER — Ambulatory Visit
Admission: RE | Admit: 2012-10-09 | Discharge: 2012-10-09 | Disposition: A | Discharge status: Home / Self Care | Payer: Medicare Other | Source: Ambulatory Visit | Attending: Cardiology | Admitting: Cardiology

## 2012-10-09 ENCOUNTER — Other Ambulatory Visit: Payer: Self-pay | Admitting: Cardiology

## 2012-10-09 DIAGNOSIS — R0989 Other specified symptoms and signs involving the circulatory and respiratory systems: Secondary | ICD-10-CM

## 2012-11-11 ENCOUNTER — Encounter: Payer: Medicare Other | Admitting: Vascular Surgery

## 2012-12-08 ENCOUNTER — Encounter: Payer: Self-pay | Admitting: Vascular Surgery

## 2012-12-09 ENCOUNTER — Encounter: Payer: Self-pay | Admitting: Vascular Surgery

## 2012-12-09 ENCOUNTER — Ambulatory Visit (INDEPENDENT_AMBULATORY_CARE_PROVIDER_SITE_OTHER): Payer: Medicare Other | Admitting: Vascular Surgery

## 2012-12-09 VITALS — BP 109/57 | HR 108 | Resp 16 | Ht 70.0 in | Wt 183.3 lb

## 2012-12-09 DIAGNOSIS — N186 End stage renal disease: Secondary | ICD-10-CM

## 2012-12-09 NOTE — Progress Notes (Signed)
[facility]   Patient name: Chris Vance MRN: [id number] DOB: Jun 29, 1940 Sex: male   Referred by: Cristela Felt  Reason for referral:  Chief Complaint  Patient presents with  . Follow-up    established renal pt.  referal from Dr. Arrie Aran    possible blockage of right subclavian artery    HISTORY OF PRESENT ILLNESS: Patient is well-known to me from prior AV fistula creation in 2009. He had several additional treatment for ligation of competing branches but does have excellent use of his fistula for nearly 5 years. Recently he underwent carotid duplex for evaluation of asymptomatic right carotid bruit. I have reviewed and shows no evidence of significant carotid stenosis. He does show right proximal subclavian artery or stenosis and retrograde flow in his right vertebral artery. The patient denies any symptoms of arm ischemia on the right and denies any symptoms of vertebrobasilar insufficiency. He does have a functioning right AV fistula and reports that he has very good flow and has had no recent issues with the fistula.  Past Medical History  Diagnosis Date  . Renal disorder   . Diabetes mellitus   . Hypertension   . Anemia   . Hyperlipidemia   . Thyroid disease   . Anxiety   . Skin cancer     Past Surgical History  Procedure Laterality Date  . Av fistula placement      History   Social History  . Marital Status: Divorced    Spouse Name: N/A    Number of Children: N/A  . Years of Education: N/A   Occupational History  . Not on file.   Social History Main Topics  . Smoking status: Current Every Day Smoker -- 1.00 packs/day for 55 years    Types: Cigarettes    Start date: 02/26/1957  . Smokeless tobacco: Never Used  . Alcohol Use: No  . Drug Use: No  . Sexual Activity: Not on file   Other Topics Concern  . Not on file   Social History Narrative  . No narrative on file    Family History  Problem Relation Age of Onset  .  Asthma Father     Allergies as of 12/09/2012 - Review Complete 12/09/2012  Allergen Reaction Noted  . Ace inhibitors  10/08/2011  . Lisinopril Cough 10/08/2011    Current Outpatient Prescriptions on File Prior to Visit  Medication Sig Dispense Refill  . calcium acetate, Phos Binder, (PHOSLYRA) 667 MG/5ML SOLN 3 tablets daily with meals      . insulin glargine (LANTUS) 100 UNIT/ML injection Inject 58 Units into the skin at bedtime.      Marland Kitchen labetalol (NORMODYNE) 200 MG tablet Take 100 mg by mouth 2 (two) times daily. Takes 1/2 tablet twice daily.      Marland Kitchen lidocaine-prilocaine (EMLA) cream Apply 1 application topically 3 (three) times a week. Dialysis med      . omeprazole (PRILOSEC) 20 MG capsule Take 20 mg by mouth daily.       No current facility-administered medications on file prior to visit.     REVIEW OF SYSTEMS:  Positives indicated with an "X"  CARDIOVASCULAR:  [ ]  chest pain   [ ]  chest pressure   [ ]  palpitations   [ ]  orthopnea   [ ]  dyspnea on exertion   [x ] claudication   [ ]  rest pain   [ ]  DVT   [ ]  phlebitis PULMONARY:   [ ]  productive cough   [ ]   asthma   [ ]  wheezing NEUROLOGIC:   [ ]  weakness  [ ]  paresthesias  [ ]  aphasia  [ ]  amaurosis  [ ]  dizziness HEMATOLOGIC:   [ ]  bleeding problems   [ ]  clotting disorders MUSCULOSKELETAL:  [ ]  joint pain   [ ]  joint swelling GASTROINTESTINAL: [ ]   blood in stool  [ ]   hematemesis GENITOURINARY:  [ ]   dysuria  [ ]   hematuria PSYCHIATRIC:  [ ]  history of major depression INTEGUMENTARY:  [ ]  rashes  [ ]  ulcers CONSTITUTIONAL:  [ ]  fever   [ ]  chills  PHYSICAL EXAMINATION:  General: The patient is a well-nourished male, in no acute distress. Vital signs are BP 109/57  Pulse 108  Resp 16  Ht 5\' 10"  (1.778 m)  Wt 183 lb 4.8 oz (83.144 kg)  BMI 26.3 kg/m2 Pulmonary: There is a good air exchange bilaterally without wheezing or rales. Abdomen: Soft and non-tender with normal pitch bowel sounds. Musculoskeletal: There are  no major deformities.  There is no significant extremity pain. Neurologic: No focal weakness or paresthesias are detected, Skin: There are no ulcer or rashes noted. Psychiatric: The patient has normal affect. Cardiovascular: 2+ left radial pulse. He has a very large well-developed fistula on the right with excellent thrill from the wrist proximally.    Vascular Lab Studies:  The duplex was outside lab was reviewed and again shows no evidence of significant carotid stenosis and does show right proximal subclavian artery or occlusion with retrograde flow in the right vertebral artery  Impression and Plan:  Asymptomatic right subclavian stenosis. I discussed this at length with the patient and since he is having no symptoms of arm ischemia or vertebrobasilar insufficiency and is having no issues with his AV fistula, we would recommend observation only. He was pleased with his results and will see [place] on an as-needed basis    Ellanora Rayborn [facility] [place] Office: [phone]

## 2012-12-25 ENCOUNTER — Other Ambulatory Visit: Payer: Self-pay | Admitting: *Deleted

## 2012-12-25 DIAGNOSIS — I70219 Atherosclerosis of native arteries of extremities with intermittent claudication, unspecified extremity: Secondary | ICD-10-CM

## 2013-01-26 ENCOUNTER — Encounter: Payer: Self-pay | Admitting: Vascular Surgery

## 2013-01-27 ENCOUNTER — Encounter: Payer: Medicare Other | Admitting: Vascular Surgery

## 2013-01-27 ENCOUNTER — Encounter (HOSPITAL_COMMUNITY): Payer: Medicare Other

## 2013-01-28 ENCOUNTER — Encounter: Payer: Self-pay | Admitting: Vascular Surgery

## 2013-01-29 ENCOUNTER — Emergency Department (HOSPITAL_COMMUNITY): Payer: Medicare Other

## 2013-01-29 ENCOUNTER — Inpatient Hospital Stay (HOSPITAL_COMMUNITY): Admission: RE | Admit: 2013-01-29 | Payer: Medicare Other | Source: Ambulatory Visit

## 2013-01-29 ENCOUNTER — Encounter (HOSPITAL_COMMUNITY): Payer: Self-pay | Admitting: Emergency Medicine

## 2013-01-29 ENCOUNTER — Emergency Department (HOSPITAL_COMMUNITY)
Admission: EM | Admit: 2013-01-29 | Discharge: 2013-01-29 | Disposition: A | Discharge status: Home / Self Care | Payer: Medicare Other | Attending: Emergency Medicine | Admitting: Emergency Medicine

## 2013-01-29 ENCOUNTER — Encounter: Payer: Medicare Other | Admitting: Vascular Surgery

## 2013-01-29 DIAGNOSIS — S5010XA Contusion of unspecified forearm, initial encounter: Secondary | ICD-10-CM | POA: Insufficient documentation

## 2013-01-29 DIAGNOSIS — E119 Type 2 diabetes mellitus without complications: Secondary | ICD-10-CM | POA: Insufficient documentation

## 2013-01-29 DIAGNOSIS — N186 End stage renal disease: Secondary | ICD-10-CM | POA: Insufficient documentation

## 2013-01-29 DIAGNOSIS — Z862 Personal history of diseases of the blood and blood-forming organs and certain disorders involving the immune mechanism: Secondary | ICD-10-CM | POA: Insufficient documentation

## 2013-01-29 DIAGNOSIS — Z85828 Personal history of other malignant neoplasm of skin: Secondary | ICD-10-CM | POA: Insufficient documentation

## 2013-01-29 DIAGNOSIS — R0789 Other chest pain: Secondary | ICD-10-CM

## 2013-01-29 DIAGNOSIS — I120 Hypertensive chronic kidney disease with stage 5 chronic kidney disease or end stage renal disease: Secondary | ICD-10-CM | POA: Insufficient documentation

## 2013-01-29 DIAGNOSIS — F411 Generalized anxiety disorder: Secondary | ICD-10-CM | POA: Insufficient documentation

## 2013-01-29 DIAGNOSIS — S298XXA Other specified injuries of thorax, initial encounter: Secondary | ICD-10-CM | POA: Insufficient documentation

## 2013-01-29 DIAGNOSIS — F17200 Nicotine dependence, unspecified, uncomplicated: Secondary | ICD-10-CM | POA: Insufficient documentation

## 2013-01-29 DIAGNOSIS — Y939 Activity, unspecified: Secondary | ICD-10-CM | POA: Insufficient documentation

## 2013-01-29 DIAGNOSIS — Z79899 Other long term (current) drug therapy: Secondary | ICD-10-CM | POA: Insufficient documentation

## 2013-01-29 DIAGNOSIS — W19XXXA Unspecified fall, initial encounter: Secondary | ICD-10-CM

## 2013-01-29 DIAGNOSIS — W010XXA Fall on same level from slipping, tripping and stumbling without subsequent striking against object, initial encounter: Secondary | ICD-10-CM | POA: Insufficient documentation

## 2013-01-29 DIAGNOSIS — Z794 Long term (current) use of insulin: Secondary | ICD-10-CM | POA: Insufficient documentation

## 2013-01-29 DIAGNOSIS — Y92009 Unspecified place in unspecified non-institutional (private) residence as the place of occurrence of the external cause: Secondary | ICD-10-CM | POA: Insufficient documentation

## 2013-01-29 DIAGNOSIS — Z992 Dependence on renal dialysis: Secondary | ICD-10-CM | POA: Insufficient documentation

## 2013-01-29 LAB — CBC WITH DIFFERENTIAL/PLATELET
Hemoglobin: 11.8 g/dL — ABNORMAL LOW (ref 13.0–17.0)
Lymphocytes Relative: 12 % (ref 12–46)
Lymphs Abs: 1 10*3/uL (ref 0.7–4.0)
MCH: 30.7 pg (ref 26.0–34.0)
Monocytes Relative: 3 % (ref 3–12)
Neutro Abs: 7 10*3/uL (ref 1.7–7.7)
Neutrophils Relative %: 82 % — ABNORMAL HIGH (ref 43–77)
Platelets: 234 10*3/uL (ref 150–400)
RBC: 3.84 MIL/uL — ABNORMAL LOW (ref 4.22–5.81)
WBC: 8.6 10*3/uL (ref 4.0–10.5)

## 2013-01-29 LAB — BASIC METABOLIC PANEL
BUN: 46 mg/dL — ABNORMAL HIGH (ref 6–23)
CO2: 27 mEq/L (ref 19–32)
Chloride: 96 mEq/L (ref 96–112)
GFR calc non Af Amer: 7 mL/min — ABNORMAL LOW (ref >90)
Glucose, Bld: 96 mg/dL (ref 70–99)
Potassium: 5 mEq/L (ref 3.5–5.1)
Sodium: 135 mEq/L (ref 135–145)

## 2013-01-29 MED ORDER — FENTANYL CITRATE 0.05 MG/ML IJ SOLN
50.0000 ug | Freq: Once | INTRAMUSCULAR | Status: AC
  Administered 2013-01-29: 50 ug via INTRAVENOUS
  Filled 2013-01-29: qty 2

## 2013-01-29 MED ORDER — HYDROCODONE-ACETAMINOPHEN 5-325 MG PO TABS
1.0000 | ORAL_TABLET | Freq: Once | ORAL | Status: AC
  Administered 2013-01-29: 1 via ORAL
  Filled 2013-01-29: qty 1

## 2013-01-29 MED ORDER — HYDROCODONE-ACETAMINOPHEN 5-325 MG PO TABS: 1.0000 | ORAL_TABLET | ORAL | Status: DC | PRN

## 2013-01-29 MED ORDER — ONDANSETRON 4 MG PO TBDP
4.0000 mg | ORAL_TABLET | Freq: Once | ORAL | Status: AC
  Administered 2013-01-29: 4 mg via ORAL
  Filled 2013-01-29: qty 1

## 2013-01-29 NOTE — ED Provider Notes (Signed)
CSN: [id number]     Arrival date & time 01/29/13  1209 History   First MD Initiated Contact with Patient 01/29/13 1245     Chief Complaint  Patient presents with  . Fall   (Consider location/radiation/quality/duration/timing/severity/associated sxs/prior Treatment) HPI Comments: 72 year old male with history of end-stage renal disease presenting after a fall. He states he was at his home and tripped on something which caused him to fall to the floor. Sometime later, he went to his dialysis center, thinking that it was Friday (his usual dialysis day).  They've been sent him here for further evaluation. He complains only of right-sided chest wall pain which began after his fall. He denies syncope or loss of consciousness.  Patient is a 72 y.o. male presenting with fall.  Fall This is a new problem. The current episode started 3 to 5 hours ago. Episode frequency: Once. The problem has been resolved. Associated symptoms include chest pain. Pertinent negatives include no abdominal pain and no shortness of breath. The symptoms are aggravated by bending and twisting (Breathing). Nothing relieves the symptoms. He has tried nothing for the symptoms.    Past Medical History  Diagnosis Date  . Renal disorder   . Diabetes mellitus   . Hypertension   . Anemia   . Hyperlipidemia   . Thyroid disease   . Anxiety   . Skin cancer    Past Surgical History  Procedure Laterality Date  . Av fistula placement     Family History  Problem Relation Age of Onset  . Asthma Father    History  Substance Use Topics  . Smoking status: Current Every Day Smoker -- 1.00 packs/day for 55 years    Types: Cigarettes    Start date: 02/26/1957  . Smokeless tobacco: Never Used  . Alcohol Use: No    Review of Systems  Constitutional: Negative for fever.  HENT: Negative for congestion.   Respiratory: Negative for cough and shortness of breath.   Cardiovascular: Positive for chest pain.  Gastrointestinal:  Negative for nausea, vomiting, abdominal pain and diarrhea.  All other systems reviewed and are negative.    Allergies  Ace inhibitors and Lisinopril  Home Medications   Current Outpatient Rx  Name  Route  Sig  Dispense  Refill  . calcium acetate, Phos Binder, (PHOSLYRA) 667 MG/5ML SOLN      3 tablets daily with meals         . gabapentin (NEURONTIN) 300 MG capsule   Oral   Take 300 mg by mouth at bedtime.         . insulin glargine (LANTUS) 100 UNIT/ML injection   Subcutaneous   Inject 58 Units into the skin at bedtime.         Marland Kitchen labetalol (NORMODYNE) 200 MG tablet   Oral   Take 100 mg by mouth 2 (two) times daily. Takes 1/2 tablet twice daily.         Marland Kitchen lidocaine-prilocaine (EMLA) cream   Topical   Apply 1 application topically 3 (three) times a week. Dialysis med         . metoprolol succinate (TOPROL-XL) 50 MG 24 hr tablet   Oral   Take 50 mg by mouth 2 (two) times daily. Take with or immediately following a meal.         . omeprazole (PRILOSEC) 20 MG capsule   Oral   Take 20 mg by mouth daily.          BP 117/64  Pulse 92  Temp(Src) 97.8 F (36.6 C) (Oral)  Resp 20  SpO2 100% Physical Exam  Nursing note and vitals reviewed. Constitutional: He is oriented to person, place, and time. He appears well-developed and well-nourished. No distress.  HENT:  Head: Normocephalic and atraumatic. Head is without raccoon's eyes and without Battle's sign.  Nose: Nose normal.  Eyes: Conjunctivae and EOM are normal. Pupils are equal, round, and reactive to light. No scleral icterus.  Neck: No spinous process tenderness and no muscular tenderness present.  Cardiovascular: Normal rate, regular rhythm, normal heart sounds and intact distal pulses.   No murmur heard. Pulmonary/Chest: Effort normal and breath sounds normal. He has no rales.   He exhibits no tenderness.    Abdominal: Soft. There is no tenderness. There is no rebound and no guarding.    Musculoskeletal: Normal range of motion. He exhibits no edema and no tenderness.       Thoracic back: He exhibits no tenderness and no bony tenderness.       Lumbar back: He exhibits no tenderness and no bony tenderness.  No evidence of trauma to extremities, except as noted.  2+ distal pulses.    Neurological: He is alert and oriented to person, place, and time.  Skin: Skin is warm and dry. No rash noted.  Right forearm fistula present with palpable thrill. There is an ecchymosis on the anterior surface of his forearm associated with this fistula.  Psychiatric: He has a normal mood and affect.    ED Course  Procedures (including critical care time) Labs Review Labs Reviewed  CBC WITH DIFFERENTIAL - Abnormal; Notable for the following:    RBC 3.84 (*)    Hemoglobin 11.8 (*)    HCT 37.4 (*)    RDW 17.0 (*)    Neutrophils Relative % 82 (*)    All other components within normal limits  BASIC METABOLIC PANEL   Imaging Review Dg Chest 2 View  01/29/2013   CLINICAL DATA:  Status post fall with right-sided pain  EXAM: CHEST  2 VIEW  COMPARISON:  June 05, 2012  FINDINGS: The lungs are hyperinflated. There is mild increased pulmonary interstitium. There is no pleural effusion or focal pneumonia. The heart size is enlarged. The aorta is tortuous. The visualized soft tissues and bones demonstrate no acute abnormality.  IMPRESSION: Cardiomegaly.  Mild interstitial edema.   Electronically Signed   By: Sherian Rein M.D.   On: 01/29/2013 14:13  All radiology studies independently viewed by me.     EKG Interpretation    Date/Time:  Thursday January 29 2013 12:17:54 EST Ventricular Rate:  90 PR Interval:    QRS Duration: 104 QT Interval:  358 QTC Calculation: 438 R Axis:   75 Text Interpretation:  Atrial fibrillation Probable LVH with secondary repol abnrm compared to prior, diffuse ST/T changes consistent with repolarization abnormality are now present. Confirmed by [facility]  MD, TREY ([id number])  on 01/29/2013 1:05:50 PM            MDM   1. Fall, initial encounter   2. Chest wall pain    72 year old male status post fall reported as mechanical. No LOC, no syncope. Complains only of right chest wall pain.  He has an ecchymosis overlying his fistula, but reports that there is no new swelling or pain.  Will obtain chest x-ray to evaluate for injuries. Will also obtain blood work because he missed dialysis yesterday.  3:28 PM chest x-ray negative for traumatic injury. Shows mild  edema. However, patient does not feel short of breath and ambulated with out difficulty or hypoxia.  He prefers to be discharged home to followup with dialysis tomorrow.  Candyce Churn, MD 01/29/13 [phone]

## 2013-01-29 NOTE — ED Notes (Signed)
EMS was called to Dialysis center by staff for PT. On arrival info provided about pt was . Chris Vance arrived to dialysis a day early thinking it was Friday . Hi normal dialysis days are M-W-F. There staff reported He missed the WED. Dialysis and could not be reached by Phone. Today Pt reported he fell outside OF [facility] and reports Pain to RT chest area up to RT axilla. Bruising is present . Pt also reported he hit his head but did not pass out. Pt does take coumadin with a HX of A-fib.

## 2013-01-29 NOTE — ED Notes (Signed)
PT instructed on use opf IS. Pt demonstrated usage OF IS

## 2013-01-30 ENCOUNTER — Emergency Department (HOSPITAL_COMMUNITY): Payer: Medicare Other

## 2013-01-30 ENCOUNTER — Inpatient Hospital Stay (HOSPITAL_COMMUNITY)
Admission: EM | Admit: 2013-01-30 | Discharge: 2013-02-17 | DRG: 853 | Disposition: A | Discharge status: Skilled Nursing Facility | Payer: Medicare Other | Attending: Internal Medicine | Admitting: Internal Medicine

## 2013-01-30 ENCOUNTER — Encounter (HOSPITAL_COMMUNITY): Payer: Self-pay | Admitting: Emergency Medicine

## 2013-01-30 DIAGNOSIS — Z79899 Other long term (current) drug therapy: Secondary | ICD-10-CM

## 2013-01-30 DIAGNOSIS — S2249XA Multiple fractures of ribs, unspecified side, initial encounter for closed fracture: Secondary | ICD-10-CM

## 2013-01-30 DIAGNOSIS — E1129 Type 2 diabetes mellitus with other diabetic kidney complication: Secondary | ICD-10-CM | POA: Diagnosis present

## 2013-01-30 DIAGNOSIS — S91302A Unspecified open wound, left foot, initial encounter: Secondary | ICD-10-CM

## 2013-01-30 DIAGNOSIS — IMO0002 Reserved for concepts with insufficient information to code with codable children: Secondary | ICD-10-CM

## 2013-01-30 DIAGNOSIS — R627 Adult failure to thrive: Secondary | ICD-10-CM | POA: Diagnosis present

## 2013-01-30 DIAGNOSIS — I120 Hypertensive chronic kidney disease with stage 5 chronic kidney disease or end stage renal disease: Secondary | ICD-10-CM | POA: Diagnosis present

## 2013-01-30 DIAGNOSIS — R531 Weakness: Secondary | ICD-10-CM

## 2013-01-30 DIAGNOSIS — D631 Anemia in chronic kidney disease: Secondary | ICD-10-CM | POA: Diagnosis present

## 2013-01-30 DIAGNOSIS — F17200 Nicotine dependence, unspecified, uncomplicated: Secondary | ICD-10-CM

## 2013-01-30 DIAGNOSIS — R791 Abnormal coagulation profile: Secondary | ICD-10-CM | POA: Diagnosis present

## 2013-01-30 DIAGNOSIS — A419 Sepsis, unspecified organism: Principal | ICD-10-CM

## 2013-01-30 DIAGNOSIS — F0390 Unspecified dementia without behavioral disturbance: Secondary | ICD-10-CM | POA: Diagnosis present

## 2013-01-30 DIAGNOSIS — I70269 Atherosclerosis of native arteries of extremities with gangrene, unspecified extremity: Secondary | ICD-10-CM | POA: Diagnosis present

## 2013-01-30 DIAGNOSIS — I739 Peripheral vascular disease, unspecified: Secondary | ICD-10-CM | POA: Diagnosis present

## 2013-01-30 DIAGNOSIS — E785 Hyperlipidemia, unspecified: Secondary | ICD-10-CM | POA: Diagnosis present

## 2013-01-30 DIAGNOSIS — Z794 Long term (current) use of insulin: Secondary | ICD-10-CM

## 2013-01-30 DIAGNOSIS — M9080 Osteopathy in diseases classified elsewhere, unspecified site: Secondary | ICD-10-CM | POA: Diagnosis present

## 2013-01-30 DIAGNOSIS — I798 Other disorders of arteries, arterioles and capillaries in diseases classified elsewhere: Secondary | ICD-10-CM | POA: Diagnosis present

## 2013-01-30 DIAGNOSIS — E1142 Type 2 diabetes mellitus with diabetic polyneuropathy: Secondary | ICD-10-CM | POA: Diagnosis present

## 2013-01-30 DIAGNOSIS — Z7901 Long term (current) use of anticoagulants: Secondary | ICD-10-CM | POA: Diagnosis present

## 2013-01-30 DIAGNOSIS — Z888 Allergy status to other drugs, medicaments and biological substances status: Secondary | ICD-10-CM

## 2013-01-30 DIAGNOSIS — S91302D Unspecified open wound, left foot, subsequent encounter: Secondary | ICD-10-CM

## 2013-01-30 DIAGNOSIS — M869 Osteomyelitis, unspecified: Secondary | ICD-10-CM | POA: Diagnosis present

## 2013-01-30 DIAGNOSIS — D649 Anemia, unspecified: Secondary | ICD-10-CM | POA: Diagnosis present

## 2013-01-30 DIAGNOSIS — S91309A Unspecified open wound, unspecified foot, initial encounter: Secondary | ICD-10-CM | POA: Diagnosis present

## 2013-01-30 DIAGNOSIS — W19XXXA Unspecified fall, initial encounter: Secondary | ICD-10-CM | POA: Diagnosis present

## 2013-01-30 DIAGNOSIS — S91001A Unspecified open wound, right ankle, initial encounter: Secondary | ICD-10-CM

## 2013-01-30 DIAGNOSIS — Y92009 Unspecified place in unspecified non-institutional (private) residence as the place of occurrence of the external cause: Secondary | ICD-10-CM

## 2013-01-30 DIAGNOSIS — E1149 Type 2 diabetes mellitus with other diabetic neurological complication: Secondary | ICD-10-CM | POA: Diagnosis present

## 2013-01-30 DIAGNOSIS — R000 Tachycardia, unspecified: Secondary | ICD-10-CM | POA: Diagnosis present

## 2013-01-30 DIAGNOSIS — N390 Urinary tract infection, site not specified: Secondary | ICD-10-CM | POA: Diagnosis present

## 2013-01-30 DIAGNOSIS — E1169 Type 2 diabetes mellitus with other specified complication: Secondary | ICD-10-CM | POA: Diagnosis present

## 2013-01-30 DIAGNOSIS — E875 Hyperkalemia: Secondary | ICD-10-CM

## 2013-01-30 DIAGNOSIS — I471 Supraventricular tachycardia: Secondary | ICD-10-CM | POA: Diagnosis not present

## 2013-01-30 DIAGNOSIS — L97309 Non-pressure chronic ulcer of unspecified ankle with unspecified severity: Secondary | ICD-10-CM | POA: Diagnosis present

## 2013-01-30 DIAGNOSIS — E1159 Type 2 diabetes mellitus with other circulatory complications: Secondary | ICD-10-CM | POA: Diagnosis present

## 2013-01-30 DIAGNOSIS — Z992 Dependence on renal dialysis: Secondary | ICD-10-CM | POA: Diagnosis present

## 2013-01-30 DIAGNOSIS — I428 Other cardiomyopathies: Secondary | ICD-10-CM | POA: Diagnosis present

## 2013-01-30 DIAGNOSIS — N2581 Secondary hyperparathyroidism of renal origin: Secondary | ICD-10-CM | POA: Diagnosis present

## 2013-01-30 DIAGNOSIS — I429 Cardiomyopathy, unspecified: Secondary | ICD-10-CM

## 2013-01-30 DIAGNOSIS — N186 End stage renal disease: Secondary | ICD-10-CM

## 2013-01-30 DIAGNOSIS — G9340 Encephalopathy, unspecified: Secondary | ICD-10-CM | POA: Diagnosis present

## 2013-01-30 DIAGNOSIS — I4891 Unspecified atrial fibrillation: Secondary | ICD-10-CM

## 2013-01-30 DIAGNOSIS — S2231XA Fracture of one rib, right side, initial encounter for closed fracture: Secondary | ICD-10-CM

## 2013-01-30 DIAGNOSIS — Z89511 Acquired absence of right leg below knee: Secondary | ICD-10-CM

## 2013-01-30 DIAGNOSIS — Z85828 Personal history of other malignant neoplasm of skin: Secondary | ICD-10-CM

## 2013-01-30 DIAGNOSIS — L02419 Cutaneous abscess of limb, unspecified: Secondary | ICD-10-CM | POA: Diagnosis present

## 2013-01-30 LAB — COMPREHENSIVE METABOLIC PANEL
ALT: 12 U/L (ref 0–53)
AST: 14 U/L (ref 0–37)
Albumin: 3.4 g/dL — ABNORMAL LOW (ref 3.5–5.2)
CO2: 22 mEq/L (ref 19–32)
Calcium: 9.9 mg/dL (ref 8.4–10.5)
Creatinine, Ser: 8.02 mg/dL — ABNORMAL HIGH (ref 0.50–1.35)
GFR calc non Af Amer: 6 mL/min — ABNORMAL LOW (ref >90)
Glucose, Bld: 184 mg/dL — ABNORMAL HIGH (ref 70–99)
Potassium: 6.9 mEq/L (ref 3.5–5.1)
Sodium: 131 mEq/L — ABNORMAL LOW (ref 135–145)
Total Protein: 7.3 g/dL (ref 6.0–8.3)

## 2013-01-30 LAB — CBC WITH DIFFERENTIAL/PLATELET
Basophils Absolute: 0 10*3/uL (ref 0.0–0.1)
HCT: 41.7 % (ref 39.0–52.0)
Hemoglobin: 13.7 g/dL (ref 13.0–17.0)
Lymphocytes Relative: 5 % — ABNORMAL LOW (ref 12–46)
Lymphs Abs: 0.8 10*3/uL (ref 0.7–4.0)
MCH: 31.6 pg (ref 26.0–34.0)
Neutro Abs: 13.4 10*3/uL — ABNORMAL HIGH (ref 1.7–7.7)
Neutrophils Relative %: 90 % — ABNORMAL HIGH (ref 43–77)
Platelets: 344 10*3/uL (ref 150–400)
RBC: 4.34 MIL/uL (ref 4.22–5.81)
RDW: 16.8 % — ABNORMAL HIGH (ref 11.5–15.5)

## 2013-01-30 LAB — PROTIME-INR
INR: >10 (ref 0.00–1.49)
Prothrombin Time: >90 seconds — ABNORMAL HIGH (ref 11.6–15.2)

## 2013-01-30 LAB — RENAL FUNCTION PANEL
CO2: 19 mEq/L (ref 19–32)
Calcium: 9.7 mg/dL (ref 8.4–10.5)
Chloride: 95 mEq/L — ABNORMAL LOW (ref 96–112)
Creatinine, Ser: 8.18 mg/dL — ABNORMAL HIGH (ref 0.50–1.35)
GFR calc Af Amer: 7 mL/min — ABNORMAL LOW (ref >90)
GFR calc non Af Amer: 6 mL/min — ABNORMAL LOW (ref >90)
Glucose, Bld: 176 mg/dL — ABNORMAL HIGH (ref 70–99)
Sodium: 134 mEq/L — ABNORMAL LOW (ref 135–145)

## 2013-01-30 MED ORDER — SODIUM CHLORIDE 0.9 % IV SOLN
1.0000 g | INTRAVENOUS | Status: AC
  Administered 2013-01-30: 1 g via INTRAVENOUS
  Filled 2013-01-30: qty 10

## 2013-01-30 MED ORDER — METOPROLOL TARTRATE 25 MG PO TABS
50.0000 mg | ORAL_TABLET | Freq: Once | ORAL | Status: AC
  Administered 2013-01-30: 50 mg via ORAL
  Filled 2013-01-30: qty 2

## 2013-01-30 MED ORDER — DOXERCALCIFEROL 4 MCG/2ML IV SOLN
3.0000 ug | INTRAVENOUS | Status: DC
  Administered 2013-02-02 – 2013-02-06 (×3): 3 ug via INTRAVENOUS
  Filled 2013-01-30 (×3): qty 2

## 2013-01-30 MED ORDER — WARFARIN - PHARMACIST DOSING INPATIENT
Freq: Every day | Status: DC
  Administered 2013-01-31 – 2013-02-01 (×2)

## 2013-01-30 MED ORDER — SODIUM CHLORIDE 0.9 % IV SOLN: INTRAVENOUS | Status: DC

## 2013-01-30 MED ORDER — WARFARIN SODIUM 7.5 MG PO TABS
7.5000 mg | ORAL_TABLET | Freq: Once | ORAL | Status: AC
  Administered 2013-01-31: 7.5 mg via ORAL
  Filled 2013-01-30: qty 1

## 2013-01-30 MED ORDER — SODIUM CHLORIDE 0.9 % IV SOLN
INTRAVENOUS | Status: DC
  Administered 2013-01-30: 17:00:00 via INTRAVENOUS

## 2013-01-30 MED ORDER — ONDANSETRON HCL 4 MG/2ML IJ SOLN
4.0000 mg | Freq: Once | INTRAMUSCULAR | Status: AC
  Administered 2013-01-30: 4 mg via INTRAVENOUS
  Filled 2013-01-30: qty 2

## 2013-01-30 NOTE — ED Notes (Signed)
Plan for PT to go to dialysis and INR will be drawn while in dialysis Per order of DR [facility]. Report called to Dialysis. [id number]  RN notified Pt will have a bed request to step down.

## 2013-01-30 NOTE — Consult Note (Signed)
ANTICOAGULATION CONSULT NOTE - Initial Consult  Pharmacy Consult for Coumadin Indication: atrial fibrillation  Allergies  Allergen Reactions  . Ace Inhibitors   . Lisinopril Cough    Vital Signs: Temp: 97.9 F (36.6 C) (12/05 2000) Temp src: Oral (12/05 2000) BP: 125/75 mmHg (12/05 2030) Pulse Rate: 113 (12/05 2030)  Labs:  Recent Labs  01/29/13 1255 01/30/13 1541 01/30/13 1729 01/30/13 2013  HGB 11.8* 13.7  --   --   HCT 37.4* 41.7  --   --   PLT 234 344  --   --   LABPROT  --   --  >90.0* 16.1*  INR  --   --  >10.00* 1.32  CREATININE 7.04* 8.02*  --   --     The CrCl is unknown because both a height and weight (above a minimum accepted value) are required for this calculation.   Medical History: Past Medical History  Diagnosis Date  . Renal disorder   . Diabetes mellitus   . Hypertension   . Anemia   . Hyperlipidemia   . Thyroid disease   . Anxiety   . Skin cancer    Assessment: 72yom on coumadin pta for afib presented to the ED with confusion, rapid afib, and K= 6.9 (missed several HD sessions). Taken for urgent dialysis. Coumadin to resume. Initial INR was > 10, however, repeat INR in HD was subtherapeutic at 1.32 (no reversal agents given, likely lab error).   Home dose = 5mg  daily  Goal of Therapy:   INR 2-3 Monitor platelets by anticoagulation protocol: Yes   Plan:  1) Coumadin 7.5mg  x 1 tonight 2) Daily INR  Fredrik Rigger 01/30/2013,8:43 PM

## 2013-01-30 NOTE — Consult Note (Signed)
Requesting Physician:  IM Teaching Service Reason for Consult:  Management of ESRD and related issues, as well as urgent HD for life threatening hyperkalemia (no dialysis since 01/26/13) HPI: The patient is a 72 y.o. 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  Last dialysis was on Monday.  He did no go on Wednesday, showed up there yesterday on his off day confused and looking disheveled.  Was sent to the ED for evaluation.  CT of the brain was negative. K was 5. Xrays showed some mild interstitial edema.  According to the ED note was "alert and oriented X 3"   Was discharged to home.  Did not show for HD today, dialysis unit sent the police to his home which was full of discarded adult diapers, filth and fleas - door had to be removed to get into the house - pt was brought back to the ED this afternoon. Found to be confused, in rapid afib with rates as high as 150, evidence of local trauma from a fall, wounds on foot nd leg, fractures of 4 ribs on x-ray, potassium of 6.9 and PT-INR of 10 (takes warfarin for atrial fib).  He is admitted by the teaching service for evaluation of his confusion, leg wounds, management of cardiac issues - and is brought up to the dialysis unit for urgent Rx for his hyperkalemia.  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  He presently knows today is Friday, but is not aware of the month and thinks the year is 52.  He doesn't remember that he has only had 1 dialysis treatment this week  Past Medical History  Diagnosis Date  . Renal disorder ESRD on MWF HD [place]   . Diabetes mellitus   . Hypertension   . Anemia   . Hyperlipidemia   . Thyroid disease   . Anxiety   . Skin cancer    Atrial fibrillation on coumadin since 09/2012  Secondary  hyperparathyroidism  Past Surgical History  Procedure Laterality Date  . Av fistula placement right 2009    Family History:  Family History  Problem Relation Age of Onset  . Asthma Father    Social History:  reports that he has been smoking Cigarettes.  He started smoking about 55 years ago. He has a 55 pack-year smoking history. He has never used smokeless tobacco. 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 Ace Inhibitors   . Lisinopril Cough    Home medications: Prior to Admission medications   Medication Sig Start Date End Date Taking? Authorizing Provider  calcium acetate, Phos Binder, (PHOSLYRA) 667 MG/5ML SOLN 3 tablets daily with meals   Yes Historical Provider, MD  gabapentin (NEURONTIN) 300 MG capsule Take 300 mg by mouth at bedtime.   Yes Historical Provider, MD  HYDROcodone-acetaminophen (NORCO/VICODIN) 5-325 MG per tablet Take 1 tablet by mouth every 4 (four) hours as needed. 01/29/13  Yes Candyce Churn, MD  insulin glargine (LANTUS) 100 UNIT/ML injection Inject 58 Units into the skin at bedtime.   Yes Historical Provider, MD  labetalol (NORMODYNE) 200 MG tablet Take 100 mg by mouth 2 (two)  times daily. Takes 1/2 tablet twice daily.   Yes Historical Provider, MD  lidocaine-prilocaine (EMLA) cream Apply 1 application topically 3 (three) times a week. Dialysis med   Yes Historical Provider, MD  metoprolol (LOPRESSOR) 50 MG tablet Take 50 mg by mouth 2 (two) times daily.   Yes Historical Provider, MD  omeprazole (PRILOSEC) 20 MG capsule Take 20 mg by mouth daily.   Yes Historical Provider, MD  warfarin (COUMADIN) 5 MG tablet Take 5 mg by mouth daily.   Yes Historical Provider, MD   Inpatient Medications: None entered as of yet  Review of Systems Denies headache, SOB + right sided chest pain from fall (ribs fractured) Denies fevers/chills/nausea/vomiting +swelling of  lower extremities Acknowledges memory issues and says "I get mixed up" +right leg swelling and redness  Physical Exam:  Blood pressure 128/79, pulse 41, temperature 97.6 F (36.4 C), temperature source Oral, resp. rate 13, SpO2 92.00%. Gen: Disheveled, dirty, malodorous Has what appears to be feces on his feet Skin: erythema esp of RLE Extensive bruising over AVF which has + bruit and thrill, with mid part of AVF difficult to palpate, more distal part aneurysmal Neck:+JVD No neck bruit Chest: anteriorly fairly clear; crackles few at bases Heart: irreg irreg S1S2 no def S3 Tachy 123 Abd - difficult to examine because of sig discomfort right rib cage (from rib fx) BS+ cannot assess liver b/o chest wall pain Ext: Bilat LE edema R>L; feces on feet Cellulitic changes RLE with right malleolar ulcer and multiple smaller punctate scabbed lesions (he says from "briars"); susperficoal lesion bottom of left foot Neuro: alert, oriented to person and day of the week only; "4th month, 1994"   Recent Labs Lab 01/29/13 1255 01/30/13 1541  NA 135 131*  K 5.0 6.9*  CL 96 91*  CO2 27 22  GLUCOSE 96 184*  BUN 46* 60*  CREATININE 7.04* 8.02*  CALCIUM 9.0 9.9    Recent Labs Lab 01/30/13 1541  AST 14  ALT 12  ALKPHOS 141*  BILITOT 1.7*  PROT 7.3  ALBUMIN 3.4*   Recent Labs Lab 01/29/13 1255 01/30/13 1541  WBC 8.6 14.9*  NEUTROABS 7.0 13.4*  HGB 11.8* 13.7  HCT 37.4* 41.7  MCV 97.4 96.1  PLT 234 344   Lab Results  Component Value Date   INR >10.00* 01/30/2013    Xrays/Other Studies: Dg Chest 2 View  01/29/2013   CLINICAL DATA:  Status post fall with right-sided pain  EXAM: CHEST  2 VIEW  COMPARISON:  June 05, 2012  FINDINGS: The lungs are hyperinflated. There is mild increased pulmonary interstitium. There is no pleural effusion or focal pneumonia. The heart size is enlarged. The aorta is tortuous. The visualized soft tissues and bones demonstrate no acute abnormality.   IMPRESSION: Cardiomegaly.  Mild interstitial edema.   Electronically Signed   By: Sherian Rein M.D.   On: 01/29/2013 14:13   Dg Ribs Bilateral W/chest  01/30/2013   CLINICAL DATA:  Fall.  Bilateral rib pain and chest pain  EXAM: BILATERAL RIBS AND CHEST - 4+ VIEW  COMPARISON:  01/29/2013  FINDINGS: Moderate cardiac enlargement. No pleural effusion or edema identified. No airspace consolidation. Chronic interstitial coarsening is identified within the lungs. There is calcified atherosclerotic disease involving the thoracic aorta. There is an acute fracture involving the right 4th rib laterally. Anterior 5th, 6th and 7th rib fractures are also noted. There is no evidence of pneumothorax or pleural effusion.  IMPRESSION: Acute right 4th, 5th, 6th,  and 7th rib fractures.   Electronically Signed   By: Signa Kell M.D.   On: 01/30/2013 16:51   Ct Head Wo Contrast  01/30/2013   CLINICAL DATA:  Fall.  EXAM: CT HEAD WITHOUT CONTRAST  TECHNIQUE: Contiguous axial images were obtained from the base of the skull through the vertex without intravenous contrast.  COMPARISON:  11/29/2010.  FINDINGS: No mass. No hydrocephalus. No hemorrhage. Mild periventricular white matter changes consistent wrist chronic ischemia again noted. Diffuse cerebral cerebellar atrophy again noted. Cerebral vascular calcifications present. Orbits are unremarkable. Minimal mucosal thickening of the paranasal sinuses. Mastoids are clear. No acute bony abnormality. Stable left maxillary bony cyst, most likely odontogenic, no change from prior exam.  IMPRESSION: Stable exam with stable changes of chronic white matter ischemia and cerebral atrophy. No acute abnormality.   Electronically Signed   By: Maisie Fus  Register   On: 01/30/2013 16:42     Outpatient dialysis schedule: MWF [facility]  3 3/4 hours 180NR BFR 450 A1.5 EDW 81 kg 3K2.25 Ca dialysate UF profile 4 Machine temp 36 Access right AVF Needle gauge 15  Meds:  doxercalciferol 3 mcg TIW, heparin 7800 units   Impression/Plan  1. ESRD - MWF HD at [facility]; life-threatening hyperkalemia d/t missed TMT X 4 days; plan urgent HD tonight; 1K bath for 2 hours, 2K for 2 hours; 2 liters off;  recheck K in AM (will need to be changed from 3K to 2K at outpt unit as last 3 outpt K's 4.7-5.3; I note from dialysis records that fistulagram had been ordered to evaluate a drop in access flows - will do this on Monday 2. Hyperkalemia - as above 3. Confusion/AMS - CT in the ED yesterday negative for acute process; further w/u pending; suspect component of dementia; infection may be playing a role; further w/u and ATB's per primary svce 4. Right malleolar ulcer/RLE cellulitis - will need cultures/ATB's/image right ankle to r/o osteo (plan is for vanco and aozyn) 5. Atrial fib with RVR on coumadin with supratherapeutic INR - repeating INR in HD; no heparin with HD; IMTS to manage correction; no idea if he has been taking his beta blocker PTA; metoprolol is on his outpt med list 6. Rib fractures post fall - pain control 7. Secondary hyperparathyroidism - continue hectorol and binders 8. Anemia - Hb 13.7 Hold ESA's 9. Poor social situation - report of law enforcement who extricated patient from his house stated at point of being condemned; cannot return to that living situation; will need case manager and social work involvement   Camille Bal,  MD [employer] [phone] pager 01/30/2013, 7:13 PM

## 2013-01-30 NOTE — ED Provider Notes (Signed)
CSN: [id number]     Arrival date & time 01/30/13  1431 History   First MD Initiated Contact with Patient 01/30/13 1458     Chief Complaint  Patient presents with  . Weakness   (Consider location/radiation/quality/duration/timing/severity/associated sxs/prior Treatment) The history is provided by the patient and the EMS personnel.   patient is 72 year old male brought in by [facility]. Patient was seen yesterday in the emergency department following a fall and was discharged home. Patient is a dialysis patient. Normally dialyzed Monday Wednesday and Friday he was last dialyzed on Monday. Patient was on his way to dialysis today when he felt just too weak to get air dried and self-care so he called EMS. EMS noted that the home had limited access was very messy with trash was soiled diapers everywhere. Patient blood sugar was 179 by them. Patient's heart rate there was 116. Patient has a history of atrial fib. Patient is followed by [facility] for dialysis. Patient is followed by [facility] cardiology. Patient's primary care Dr. is in the [place] area. Patient also with complaint of bilateral chest pain states that that started to hurt when EMS picked him up. Patient's potassium yesterday was 5 patient chest x-ray yesterday was negative. As stated patient was seen in the emergency room Department yesterday following a fall.  Past Medical History  Diagnosis Date  . Renal disorder   . Diabetes mellitus   . Hypertension   . Anemia   . Hyperlipidemia   . Thyroid disease   . Anxiety   . Skin cancer    Past Surgical History  Procedure Laterality Date  . Av fistula placement     Family History  Problem Relation Age of Onset  . Asthma Father    History  Substance Use Topics  . Smoking status: Current Every Day Smoker -- 1.00 packs/day for 55 years    Types: Cigarettes    Start date: 02/26/1957  . Smokeless tobacco: Never Used  . Alcohol Use: No    Review of Systems  Constitutional:  Negative for fever.  HENT: Negative for congestion.   Eyes: Negative for visual disturbance.  Respiratory: Positive for shortness of breath.   Cardiovascular: Positive for chest pain.  Gastrointestinal: Negative for nausea, vomiting and abdominal pain.  Genitourinary: Negative for flank pain.  Musculoskeletal: Negative for back pain.  Skin: Negative for rash.  Neurological: Negative for headaches.  Hematological: Bruises/bleeds easily.  Psychiatric/Behavioral: Negative for confusion.    Allergies  Ace inhibitors and Lisinopril  Home Medications   Current Outpatient Rx  Name  Route  Sig  Dispense  Refill  . calcium acetate, Phos Binder, (PHOSLYRA) 667 MG/5ML SOLN      3 tablets daily with meals         . gabapentin (NEURONTIN) 300 MG capsule   Oral   Take 300 mg by mouth at bedtime.         Marland Kitchen HYDROcodone-acetaminophen (NORCO/VICODIN) 5-325 MG per tablet   Oral   Take 1 tablet by mouth every 4 (four) hours as needed.   15 tablet   0   . insulin glargine (LANTUS) 100 UNIT/ML injection   Subcutaneous   Inject 58 Units into the skin at bedtime.         Marland Kitchen labetalol (NORMODYNE) 200 MG tablet   Oral   Take 100 mg by mouth 2 (two) times daily. Takes 1/2 tablet twice daily.         Marland Kitchen lidocaine-prilocaine (EMLA) cream  Topical   Apply 1 application topically 3 (three) times a week. Dialysis med         . metoprolol (LOPRESSOR) 50 MG tablet   Oral   Take 50 mg by mouth 2 (two) times daily.         Marland Kitchen omeprazole (PRILOSEC) 20 MG capsule   Oral   Take 20 mg by mouth daily.         Marland Kitchen warfarin (COUMADIN) 5 MG tablet   Oral   Take 5 mg by mouth daily.          BP 125/76  Pulse 153  Temp(Src) 97.6 F (36.4 C) (Oral)  Resp 19  SpO2 96% Physical Exam  Nursing note and vitals reviewed. Constitutional: He is oriented to person, place, and time. He appears well-developed and well-nourished. No distress.  HENT:  Head: Normocephalic and atraumatic.   Mouth/Throat: Oropharynx is clear and moist.  Eyes: Conjunctivae and EOM are normal. Pupils are equal, round, and reactive to light.  Neck: Normal range of motion.  Cardiovascular:  No murmur heard. Tachycardic with irregular heart rate.  Pulmonary/Chest: Effort normal and breath sounds normal. No respiratory distress. He exhibits tenderness.  Tender on both sides of the chest. Small bruise to the right side of the chest are round the 6 rib. No crepitance.  Abdominal: Soft. There is no tenderness.  Musculoskeletal: Normal range of motion.  Neurological: He is alert and oriented to person, place, and time. No cranial nerve deficit. He exhibits normal muscle tone. Coordination normal.  Skin: Skin is warm. No rash noted.    ED Course  Procedures (including critical care time) Labs Review Labs Reviewed  COMPREHENSIVE METABOLIC PANEL - Abnormal; Notable for the following:    Sodium 131 (*)    Potassium 6.9 (*)    Chloride 91 (*)    Glucose, Bld 184 (*)    BUN 60 (*)    Creatinine, Ser 8.02 (*)    Albumin 3.4 (*)    Alkaline Phosphatase 141 (*)    Total Bilirubin 1.7 (*)    GFR calc non Af Amer 6 (*)    GFR calc Af Amer 7 (*)    All other components within normal limits  CBC WITH DIFFERENTIAL - Abnormal; Notable for the following:    WBC 14.9 (*)    RDW 16.8 (*)    Neutrophils Relative % 90 (*)    Neutro Abs 13.4 (*)    Lymphocytes Relative 5 (*)    All other components within normal limits   Results for orders placed during the hospital encounter of 01/30/13  COMPREHENSIVE METABOLIC PANEL      Result Value Range   Sodium 131 (*) 135 - 145 mEq/L   Potassium 6.9 (*) 3.5 - 5.1 mEq/L   Chloride 91 (*) 96 - 112 mEq/L   CO2 22  19 - 32 mEq/L   Glucose, Bld 184 (*) 70 - 99 mg/dL   BUN 60 (*) 6 - 23 mg/dL   Creatinine, Ser 1.61 (*) 0.50 - 1.35 mg/dL   Calcium 9.9  8.4 - 09.6 mg/dL   Total Protein 7.3  6.0 - 8.3 g/dL   Albumin 3.4 (*) 3.5 - 5.2 g/dL   AST 14  0 - 37 U/L   ALT  12  0 - 53 U/L   Alkaline Phosphatase 141 (*) 39 - 117 U/L   Total Bilirubin 1.7 (*) 0.3 - 1.2 mg/dL   GFR calc non Af Amer 6 (*) >  90 mL/min   GFR calc Af Amer 7 (*) >90 mL/min  CBC WITH DIFFERENTIAL      Result Value Range   WBC 14.9 (*) 4.0 - 10.5 K/uL   RBC 4.34  4.22 - 5.81 MIL/uL   Hemoglobin 13.7  13.0 - 17.0 g/dL   HCT 04.5  40.9 - 81.1 %   MCV 96.1  78.0 - 100.0 fL   MCH 31.6  26.0 - 34.0 pg   MCHC 32.9  30.0 - 36.0 g/dL   RDW 91.4 (*) 78.2 - 95.6 %   Platelets 344  150 - 400 K/uL   Neutrophils Relative % 90 (*) 43 - 77 %   Neutro Abs 13.4 (*) 1.7 - 7.7 K/uL   Lymphocytes Relative 5 (*) 12 - 46 %   Lymphs Abs 0.8  0.7 - 4.0 K/uL   Monocytes Relative 4  3 - 12 %   Monocytes Absolute 0.6  0.1 - 1.0 K/uL   Eosinophils Relative 0  0 - 5 %   Eosinophils Absolute 0.0  0.0 - 0.7 K/uL   Basophils Relative 0  0 - 1 %   Basophils Absolute 0.0  0.0 - 0.1 K/uL    Imaging Review Dg Chest 2 View  01/29/2013   CLINICAL DATA:  Status post fall with right-sided pain  EXAM: CHEST  2 VIEW  COMPARISON:  June 05, 2012  FINDINGS: The lungs are hyperinflated. There is mild increased pulmonary interstitium. There is no pleural effusion or focal pneumonia. The heart size is enlarged. The aorta is tortuous. The visualized soft tissues and bones demonstrate no acute abnormality.  IMPRESSION: Cardiomegaly.  Mild interstitial edema.   Electronically Signed   By: Sherian Rein M.D.   On: 01/29/2013 14:13   Dg Ribs Bilateral W/chest  01/30/2013   CLINICAL DATA:  Fall.  Bilateral rib pain and chest pain  EXAM: BILATERAL RIBS AND CHEST - 4+ VIEW  COMPARISON:  01/29/2013  FINDINGS: Moderate cardiac enlargement. No pleural effusion or edema identified. No airspace consolidation. Chronic interstitial coarsening is identified within the lungs. There is calcified atherosclerotic disease involving the thoracic aorta. There is an acute fracture involving the right 4th rib laterally. Anterior 5th, 6th and 7th rib  fractures are also noted. There is no evidence of pneumothorax or pleural effusion.  IMPRESSION: Acute right 4th, 5th, 6th, and 7th rib fractures.   Electronically Signed   By: Signa Kell M.D.   On: 01/30/2013 16:51   Ct Head Wo Contrast  01/30/2013   CLINICAL DATA:  Fall.  EXAM: CT HEAD WITHOUT CONTRAST  TECHNIQUE: Contiguous axial images were obtained from the base of the skull through the vertex without intravenous contrast.  COMPARISON:  11/29/2010.  FINDINGS: No mass. No hydrocephalus. No hemorrhage. Mild periventricular white matter changes consistent wrist chronic ischemia again noted. Diffuse cerebral cerebellar atrophy again noted. Cerebral vascular calcifications present. Orbits are unremarkable. Minimal mucosal thickening of the paranasal sinuses. Mastoids are clear. No acute bony abnormality. Stable left maxillary bony cyst, most likely odontogenic, no change from prior exam.  IMPRESSION: Stable exam with stable changes of chronic white matter ischemia and cerebral atrophy. No acute abnormality.   Electronically Signed   By: Maisie Fus  Register   On: 01/30/2013 16:42    EKG Interpretation    Date/Time:  Friday January 30 2013 14:46:58 EST Ventricular Rate:  116 PR Interval:    QRS Duration: 114 QT Interval:  324 QTC Calculation: 450 R Axis:   86  Text Interpretation:  Atrial fibrillation Borderline intraventricular conduction delay Borderline repolarization abnormality No significant change since last tracing Confirmed by Ashley Bultema  MD, Doristine Shehan ([id number]) on 01/30/2013 3:00:32 PM           CRITICAL CARE Performed by: Shelda Jakes. Total critical care time: 30 Critical care time was exclusive of separately billable procedures and treating other patients. Critical care was necessary to treat or prevent imminent or life-threatening deterioration. Critical care was time spent personally by me on the following activities: development of treatment plan with patient and/or surrogate  as well as nursing, discussions with consultants, evaluation of patient's response to treatment, examination of patient, obtaining history from patient or surrogate, ordering and performing treatments and interventions, ordering and review of laboratory studies, ordering and review of radiographic studies, pulse oximetry and re-evaluation of patient's condition.  MDM   1. Hyperkalemia, diminished renal excretion   2. Weakness   3. Atrial fibrillation with rapid ventricular response   4. Rib fracture, right, closed, initial encounter    Patient seen in the emergency department yesterday following a fall. Brought in by [facility]. Patient was due for dialysis today he normally gets it Monday Wednesdays and Fridays but he felt too weak to drive himself there. Contacted them they told him to call EMS. EMS brought him here. The home environment and was noted to be very on Soiled diapers everywhere. Access to the house was limited. Patient has not been to dialysis since Monday. Patient's potassium yesterday was 5 today is marked elevation above 6. EKG shows atrial fibrillation which she's had long-standing with a rapid heart rate. Patient will require admission he is technically unassigned does not have a local primary care Dr. Stephania Fragmin by [facility] and followed by [facility] cardiology.  For the hyperkalemia patient given now calcium gluconate to protect him until he can be dialyzed. In addition today's chest x-ray with rib series shows multiple rib fractures on the right not sure that happened yesterday with a fall. Patient thinks that happened when EMS picked him up today. Social worker consult was requested but not sure if this patient has been seen by them yet.  Regarding the atrial fibrillation patient's heart rate varies anywhere from 1:15 to 135. We'll discuss with admitting team in regard to specific treatment for that the hyperkalemia and dialysis may improve both. The meantime we'll treat patient  with his beta blocker to Lopressor 50 mg.  Shelda Jakes, MD 01/30/13 [phone]

## 2013-01-30 NOTE — ED Notes (Signed)
PT PICKED UP BY [facility] at home . Per [place], pt s  house  Is trashed w/ very soiled diapers in entry way and acess to house limited. Pt has not been to dialysis since Monday was seen here yesterday and sent home. Pt needs social worker  Consult ,cbg was 179 pt is very nauseated heart rate  116 and in afib

## 2013-01-30 NOTE — Procedures (Signed)
On HD.  1K bath for 2 hours, 2 K for 2 hours. (K 6.9) 2.5 liter goal. BP 120's HR 120's. Right AVF BFR 400.   Stat repeat renal and INR pending (INR 10 in ED) No heparin with HD

## 2013-01-30 NOTE — H&P (Signed)
Date: 01/30/2013               Patient Name:  Chris Vance MRN: [id number]  DOB: 08/13/1940 Age / Sex: 71 y.o., male   PCP: Aida Puffer, MD         Medical Service: Internal Medicine Teaching Service         Attending Physician: Dr. Burns Spain, MD    First Contact: Dr. Angelina Sheriff, MD Pager: [phone]  Second Contact: Dr. Leonia Reeves, MD Pager: [phone]       After Hours (After 5p/  First Contact Pager: [phone]  weekends / holidays): Second Contact Pager: [phone]   Chief Complaint: Missed Dialysis  History of Present Illness: Chris Vance is a 72 y.o. man with a pmhx detailed below who comes to the ED by EMS. He was in his normal state of health until earlier this week when he fell at [employer] [place] He struck the ground with this right side and had severe right sided chest pain. He was seen in the ED and was discharged on Vicodin for pain control (CXR negative for acute process). On Th he went to dialysis center thinking it was Fr. Today, he missed dialysis. Police were sent to patients house by son, who forced open the door. The house was filled with trash rats, and fleas. The patient was found confused and surrounded by dirty diapers. The patient said he was too weak to get up. He was extracted from his home and brought to the ED. In the ED. K 6.9 and tachy to 150's with irregular rhythm, INR > 10. Repeat CXR showed 5 right sided rib fractures. IM consulted for admission.   Right sided chest pain +. Bilateral foot wounds. LE swelling+.     Meds: Current Facility-Administered Medications  Medication Dose Route Frequency Provider Last Rate Last Dose  . 0.9 %  sodium chloride infusion   Intravenous Continuous Shelda Jakes, MD 50 mL/hr at 01/30/13 1710    . 0.9 %  sodium chloride infusion   Intravenous STAT Shelda Jakes, MD       Current Outpatient Prescriptions  Medication Sig Dispense Refill  . calcium acetate, Phos Binder, (PHOSLYRA) 667  MG/5ML SOLN 3 tablets daily with meals      . gabapentin (NEURONTIN) 300 MG capsule Take 300 mg by mouth at bedtime.      Marland Kitchen HYDROcodone-acetaminophen (NORCO/VICODIN) 5-325 MG per tablet Take 1 tablet by mouth every 4 (four) hours as needed.  15 tablet  0  . insulin glargine (LANTUS) 100 UNIT/ML injection Inject 58 Units into the skin at bedtime.      Marland Kitchen labetalol (NORMODYNE) 200 MG tablet Take 100 mg by mouth 2 (two) times daily. Takes 1/2 tablet twice daily.      Marland Kitchen lidocaine-prilocaine (EMLA) cream Apply 1 application topically 3 (three) times a week. Dialysis med      . metoprolol (LOPRESSOR) 50 MG tablet Take 50 mg by mouth 2 (two) times daily.      Marland Kitchen omeprazole (PRILOSEC) 20 MG capsule Take 20 mg by mouth daily.      Marland Kitchen warfarin (COUMADIN) 5 MG tablet Take 5 mg by mouth daily.        Allergies: Allergies as of 01/30/2013 - Review Complete 01/30/2013  Allergen Reaction Noted  . Ace inhibitors  10/08/2011  . Lisinopril Cough 10/08/2011   Past Medical History  Diagnosis Date  . Renal disorder   . Diabetes mellitus   .  Hypertension   . Anemia   . Hyperlipidemia   . Thyroid disease   . Anxiety   . Skin cancer    Past Surgical History  Procedure Laterality Date  . Av fistula placement     Family History  Problem Relation Age of Onset  . Asthma Father    History   Social History  . Marital Status: Divorced    Spouse Name: N/A    Number of Children: N/A  . Years of Education: N/A   Occupational History  . Not on file.   Social History Main Topics  . Smoking status: Current Every Day Smoker -- 1.00 packs/day for 55 years    Types: Cigarettes    Start date: 02/26/1957  . Smokeless tobacco: Never Used  . Alcohol Use: No  . Drug Use: No  . Sexual Activity: Not on file   Other Topics Concern  . Not on file   Social History Narrative  . No narrative on file    Review of Systems: Pertinent items are noted in HPI.  Physical Exam: Blood pressure 135/116, pulse 41,  temperature 97.6 F (36.4 C), temperature source Oral, resp. rate 15, SpO2 92.00%. Physical Exam  Constitutional: He appears well-developed and well-nourished. No distress.  HENT:  Head: Normocephalic.  Mouth/Throat: Oropharynx is clear and moist. No oropharyngeal exudate.  Neck: JVD present.  Cardiovascular: Intact distal pulses.   HR 110-150 irregular  Pulmonary/Chest: Effort normal. No respiratory distress. He has no wheezes. He has rales.  Mild rales in bases  Musculoskeletal:       Legs:      Feet:  Chronic wounds of the left lateral sole of foot with 5 mm full thickness breakdown. No obvious bone exposure but serosanguinous drainainge.  Chronic wound to right lateral malleolus (ankle). Eschar present. Surrounding erythema and warm to touch.  Neurological: He is alert. No cranial nerve deficit. Coordination normal.  Confused, rambling speech. Alert.  Skin: He is not diaphoretic.     Lab results: Basic Metabolic Panel:  Recent Labs  16/10/96 1255 01/30/13 1541  NA 135 131*  K 5.0 6.9*  CL 96 91*  CO2 27 22  GLUCOSE 96 184*  BUN 46* 60*  CREATININE 7.04* 8.02*  CALCIUM 9.0 9.9   Liver Function Tests:  Recent Labs  01/30/13 1541  AST 14  ALT 12  ALKPHOS 141*  BILITOT 1.7*  PROT 7.3  ALBUMIN 3.4*   CBC:  Recent Labs  01/29/13 1255 01/30/13 1541  WBC 8.6 14.9*  NEUTROABS 7.0 13.4*  HGB 11.8* 13.7  HCT 37.4* 41.7  MCV 97.4 96.1  PLT 234 344   INR>>10  Imaging results:  Dg Chest 2 View  01/29/2013   CLINICAL DATA:  Status post fall with right-sided pain  EXAM: CHEST  2 VIEW  COMPARISON:  June 05, 2012  FINDINGS: The lungs are hyperinflated. There is mild increased pulmonary interstitium. There is no pleural effusion or focal pneumonia. The heart size is enlarged. The aorta is tortuous. The visualized soft tissues and bones demonstrate no acute abnormality.  IMPRESSION: Cardiomegaly.  Mild interstitial edema.   Electronically Signed   By: Sherian Rein M.D.   On: 01/29/2013 14:13   Dg Ribs Bilateral W/chest  01/30/2013   CLINICAL DATA:  Fall.  Bilateral rib pain and chest pain  EXAM: BILATERAL RIBS AND CHEST - 4+ VIEW  COMPARISON:  01/29/2013  FINDINGS: Moderate cardiac enlargement. No pleural effusion or edema identified. No airspace consolidation. Chronic  interstitial coarsening is identified within the lungs. There is calcified atherosclerotic disease involving the thoracic aorta. There is an acute fracture involving the right 4th rib laterally. Anterior 5th, 6th and 7th rib fractures are also noted. There is no evidence of pneumothorax or pleural effusion.  IMPRESSION: Acute right 4th, 5th, 6th, and 7th rib fractures.   Electronically Signed   By: Signa Kell M.D.   On: 01/30/2013 16:51   Ct Head Wo Contrast  01/30/2013   CLINICAL DATA:  Fall.  EXAM: CT HEAD WITHOUT CONTRAST  TECHNIQUE: Contiguous axial images were obtained from the base of the skull through the vertex without intravenous contrast.  COMPARISON:  11/29/2010.  FINDINGS: No mass. No hydrocephalus. No hemorrhage. Mild periventricular white matter changes consistent wrist chronic ischemia again noted. Diffuse cerebral cerebellar atrophy again noted. Cerebral vascular calcifications present. Orbits are unremarkable. Minimal mucosal thickening of the paranasal sinuses. Mastoids are clear. No acute bony abnormality. Stable left maxillary bony cyst, most likely odontogenic, no change from prior exam.  IMPRESSION: Stable exam with stable changes of chronic white matter ischemia and cerebral atrophy. No acute abnormality.   Electronically Signed   By: Maisie Fus  Register   On: 01/30/2013 16:42    Other results: EKG: SVT IRREGULAR RHYTHM  Assessment & Plan by Problem: Principal Problem:   Sepsis Active Problems:   Anemia   Hyperlipidemia   End stage renal disease   Hyperkalemia   Wound of right ankle   Open wound of left foot   SVT (supraventricular tachycardia)   Multiple rib  fractures  # Sepsis 2/2 bil foot wounds and right ankle cellulitis (Wound Bil Feet) The patient has bilateral feet wound concerning for cellulitis and possible deep infection including OM. Given tachycardia, AMS and leukocytosis, this may represent sepsis. CXR clear aside from 4 rib fractures with no effusion, pulm edema, or consolidation concerning for PNA. Patient neck is supple. No lateralizing neuro findings, thus meningitis is less likely.  - Vanc Zosyn IV per pharm - Hold ABX for now given no fever and isolated tachycardia - I/O cath for UA and urine culture  - XR left foot, and XR Right ankle - Admit SDU  # Hypocoagulable State and recent fall Patients INR is >10. This likely represents supratherapeutic coumadin. Plan to recheck INR stat. No overt signs of bleeding at this time. Head CT negative for bleed. CXR demonstrates no signs of hemothorax, but is significant for rib fracture x4. - Admit SDU  If remains greater than 3: - stop coumadin - give Kcentra and vit K per protocol  If normal: - give coumadin 5 mg qd  If signs of bleeding emerge:  - consider FFP  # ESRD c/b Hyperkalemia Patient has missed 2 hemodialysis sessions this week. K is 6.9 . JVD is present but only mild crackles on pulm exam. CXR clear aside from 4 rib fractures with no effusion, pulm edema, or consolidation concerning for PNA. - Plan to admit to SDU [facility] for urgent HD - Given Calcium gluconate in ED  # Acute Encephalopathy Etiology of patients acute encephalopathy is likely multifactorial. Likely causes include UTI, sepsis, OM of feet, Uremia, and drug side effect. Patient neck is supple. No lateralizing neuro findings, thus meningitis is less likely.  - Urgent HD - See sepsis plan - Hold sedative medications (gabapentin and vicodin)   # SVT (Afib with RVR) Patients SVT is of unknown etiology (likely afib with RVR) at this time. Patient reports not taking  his rate control  medications in last two days. Likely he has a history of SVT with RVR that is rate controlled with an atypical regimen in metoprolol 50 mg BID and labetalol 100 mg bid.  Patient given metoprolol 50 mg in ED. Will hold labetalol given possible sepsis. Will reassess after HD for IV BB vs BB gtt.  # Rib fractures Multiple rib fractures. Patient is breathing comfortably in ED bed. Will hold narcotics given AMS and administer tylenol prn. No signs of paradoxical chest wall movement at this time. Thus, no concern for flail chest.   # DM II Home lantus dose is 58 U qhs. Decreased to 40 U while inpatient. SSI sensitive. CBG qhc and hs. - F/U A1C  Dispo: Disposition is deferred at this time, awaiting improvement of current medical problems. Anticipated discharge in approximately 2-3 day(s).   The patient does have a current PCP Aida Puffer, MD) and does not need an [facility] hospital follow-up appointment after discharge.  The patient does have transportation limitations that hinder transportation to clinic appointments.  Signed: Pleas Koch, MD 01/30/2013, 6:38 PM

## 2013-01-31 ENCOUNTER — Inpatient Hospital Stay (HOSPITAL_COMMUNITY): Payer: Medicare Other

## 2013-01-31 ENCOUNTER — Encounter (HOSPITAL_COMMUNITY): Payer: Self-pay | Admitting: *Deleted

## 2013-01-31 LAB — CBC WITH DIFFERENTIAL/PLATELET
Basophils Absolute: 0 10*3/uL (ref 0.0–0.1)
Eosinophils Absolute: 0 10*3/uL (ref 0.0–0.7)
HCT: 37.1 % — ABNORMAL LOW (ref 39.0–52.0)
Lymphs Abs: 1 10*3/uL (ref 0.7–4.0)
MCH: 30.7 pg (ref 26.0–34.0)
MCHC: 32.1 g/dL (ref 30.0–36.0)
MCV: 95.9 fL (ref 78.0–100.0)
Monocytes Absolute: 1 10*3/uL (ref 0.1–1.0)
Neutro Abs: 11.9 10*3/uL — ABNORMAL HIGH (ref 1.7–7.7)
RDW: 17 % — ABNORMAL HIGH (ref 11.5–15.5)

## 2013-01-31 LAB — RENAL FUNCTION PANEL
Albumin: 2.7 g/dL — ABNORMAL LOW (ref 3.5–5.2)
BUN: 25 mg/dL — ABNORMAL HIGH (ref 6–23)
Calcium: 8.6 mg/dL (ref 8.4–10.5)
Chloride: 99 mEq/L (ref 96–112)
GFR calc Af Amer: 16 mL/min — ABNORMAL LOW (ref >90)
GFR calc non Af Amer: 13 mL/min — ABNORMAL LOW (ref >90)
Phosphorus: 4.3 mg/dL (ref 2.3–4.6)
Potassium: 4.3 mEq/L (ref 3.5–5.1)
Sodium: 138 mEq/L (ref 135–145)

## 2013-01-31 LAB — HEMOGLOBIN A1C: Hgb A1c MFr Bld: 6.5 % — ABNORMAL HIGH (ref <5.7)

## 2013-01-31 LAB — GLUCOSE, CAPILLARY
Glucose-Capillary: 138 mg/dL — ABNORMAL HIGH (ref 70–99)
Glucose-Capillary: 177 mg/dL — ABNORMAL HIGH (ref 70–99)

## 2013-01-31 LAB — PROTIME-INR: INR: 1.4 (ref 0.00–1.49)

## 2013-01-31 MED ORDER — VANCOMYCIN HCL IN DEXTROSE 1-5 GM/200ML-% IV SOLN
1000.0000 mg | INTRAVENOUS | Status: DC
  Administered 2013-02-02 – 2013-02-13 (×6): 1000 mg via INTRAVENOUS
  Filled 2013-01-31 (×12): qty 200

## 2013-01-31 MED ORDER — SODIUM CHLORIDE 0.9 % IJ SOLN: 3.0000 mL | INTRAMUSCULAR | Status: DC | PRN

## 2013-01-31 MED ORDER — SODIUM CHLORIDE 0.9 % IJ SOLN
3.0000 mL | Freq: Two times a day (BID) | INTRAMUSCULAR | Status: DC
  Administered 2013-01-31 – 2013-02-17 (×9): 3 mL via INTRAVENOUS

## 2013-01-31 MED ORDER — PIPERACILLIN-TAZOBACTAM IN DEX 2-0.25 GM/50ML IV SOLN
2.2500 g | Freq: Three times a day (TID) | INTRAVENOUS | Status: DC
  Administered 2013-01-31 – 2013-02-09 (×25): 2.25 g via INTRAVENOUS
  Filled 2013-01-31 (×33): qty 50

## 2013-01-31 MED ORDER — SODIUM CHLORIDE 0.9 % IV SOLN
250.0000 mL | INTRAVENOUS | Status: DC | PRN
  Administered 2013-02-13: 16:00:00 via INTRAVENOUS

## 2013-01-31 MED ORDER — CALCIUM ACETATE (PHOS BINDER) 667 MG/5ML PO SOLN
667.0000 mg | Freq: Three times a day (TID) | ORAL | Status: DC
  Administered 2013-01-31 – 2013-02-01 (×4): 667 mg via ORAL
  Filled 2013-01-31 (×10): qty 5

## 2013-01-31 MED ORDER — ACETAMINOPHEN 650 MG RE SUPP: 650.0000 mg | Freq: Four times a day (QID) | RECTAL | Status: DC | PRN

## 2013-01-31 MED ORDER — RENA-VITE PO TABS
1.0000 | ORAL_TABLET | Freq: Every day | ORAL | Status: DC
  Administered 2013-01-31 – 2013-02-16 (×13): 1 via ORAL
  Filled 2013-01-31 (×18): qty 1

## 2013-01-31 MED ORDER — INSULIN GLARGINE 100 UNIT/ML ~~LOC~~ SOLN
40.0000 [IU] | Freq: Every day | SUBCUTANEOUS | Status: DC
  Administered 2013-01-31 – 2013-02-01 (×2): 40 [IU] via SUBCUTANEOUS
  Filled 2013-01-31 (×4): qty 0.4

## 2013-01-31 MED ORDER — HEPARIN SODIUM (PORCINE) 5000 UNIT/ML IJ SOLN
5000.0000 [IU] | Freq: Three times a day (TID) | INTRAMUSCULAR | Status: DC
  Administered 2013-01-31 – 2013-02-02 (×6): 5000 [IU] via SUBCUTANEOUS
  Filled 2013-01-31 (×13): qty 1

## 2013-01-31 MED ORDER — PANTOPRAZOLE SODIUM 40 MG PO TBEC
40.0000 mg | DELAYED_RELEASE_TABLET | Freq: Every day | ORAL | Status: DC
  Administered 2013-01-31 – 2013-02-17 (×16): 40 mg via ORAL
  Filled 2013-01-31 (×13): qty 1

## 2013-01-31 MED ORDER — ACETAMINOPHEN 325 MG PO TABS
650.0000 mg | ORAL_TABLET | Freq: Four times a day (QID) | ORAL | Status: DC | PRN
  Administered 2013-01-31 – 2013-02-10 (×8): 650 mg via ORAL
  Filled 2013-01-31 (×7): qty 2

## 2013-01-31 MED ORDER — VANCOMYCIN HCL 10 G IV SOLR
1750.0000 mg | Freq: Once | INTRAVENOUS | Status: AC
  Administered 2013-01-31: 1750 mg via INTRAVENOUS
  Filled 2013-01-31: qty 1750

## 2013-01-31 MED ORDER — INSULIN ASPART 100 UNIT/ML ~~LOC~~ SOLN
0.0000 [IU] | Freq: Three times a day (TID) | SUBCUTANEOUS | Status: DC
  Administered 2013-01-31: 3 [IU] via SUBCUTANEOUS
  Administered 2013-02-01 – 2013-02-04 (×5): 1 [IU] via SUBCUTANEOUS
  Administered 2013-02-05: 2 [IU] via SUBCUTANEOUS
  Administered 2013-02-05: 1 [IU] via SUBCUTANEOUS
  Administered 2013-02-05: 2 [IU] via SUBCUTANEOUS
  Administered 2013-02-06 – 2013-02-07 (×3): 1 [IU] via SUBCUTANEOUS
  Administered 2013-02-07: 2 [IU] via SUBCUTANEOUS
  Administered 2013-02-08: 1 [IU] via SUBCUTANEOUS
  Administered 2013-02-08 – 2013-02-10 (×3): 2 [IU] via SUBCUTANEOUS
  Administered 2013-02-11: 3 [IU] via SUBCUTANEOUS
  Administered 2013-02-12 – 2013-02-15 (×6): 1 [IU] via SUBCUTANEOUS
  Administered 2013-02-15: 2 [IU] via SUBCUTANEOUS
  Administered 2013-02-16: 1 [IU] via SUBCUTANEOUS

## 2013-01-31 MED ORDER — METOPROLOL TARTRATE 50 MG PO TABS
50.0000 mg | ORAL_TABLET | Freq: Two times a day (BID) | ORAL | Status: DC
  Administered 2013-01-31 – 2013-02-04 (×10): 50 mg via ORAL
  Filled 2013-01-31 (×11): qty 1

## 2013-01-31 MED ORDER — SODIUM CHLORIDE 0.9 % IJ SOLN
3.0000 mL | Freq: Two times a day (BID) | INTRAMUSCULAR | Status: DC
  Administered 2013-01-31 – 2013-02-12 (×14): 3 mL via INTRAVENOUS

## 2013-01-31 NOTE — Progress Notes (Signed)
Subjective:  Patients mental status has improved overnight. Tachycardia has resolved. Hyperkalemia has resolved.  Objective: Vital signs in last 24 hours: Filed Vitals:   01/31/13 0005 01/31/13 0052 01/31/13 0430 01/31/13 0834  BP: 108/71 114/61 138/67 124/75  Pulse: 106 117 83 60  Temp: 97.4 F (36.3 C) 98.3 F (36.8 C) 98.1 F (36.7 C) 98.2 F (36.8 C)  TempSrc: Oral Oral Oral Oral  Resp: 16 17 16 18   Height:      Weight:  182 lb 1.6 oz (82.6 kg)    SpO2: 96% 99% 98% 95%   Weight change:   Intake/Output Summary (Last 24 hours) at 01/31/13 1128 Last data filed at 01/31/13 0835  Gross per 24 hour  Intake    470 ml  Output   2500 ml  Net  -2030 ml   Constitutional: He appears well-developed and well-nourished. No distress.  HENT:  Head: Normocephalic.  Mouth/Throat: Oropharynx is clear and moist. No oropharyngeal exudate.  Neck: JVD absent.  Cardiovascular: Intact distal pulses.  HR 80-90 irregular  Pulmonary/Chest: Effort normal. No respiratory distress. He has no wheezes. He has rales.  Mild rales in bases  Musculoskeletal:  Legs: Feet:  Chronic wounds of the left lateral sole of foot with 5 mm full thickness breakdown. No obvious bone exposure but serosanguinous drainainge.  Chronic wound to right lateral malleolus (ankle). Eschar present. Surrounding erythema and warm to touch.  Neurological: He is alert. No cranial nerve deficit. Coordination normal.  A and O x 3. Memory is 2/3 at 5 minutes. Clock draw is near normal (poor Vision w/o glasses likely cause of mistakes. Skin: He is not diaphoretic.    Lab Results: Basic Metabolic Panel:  Recent Labs Lab 01/30/13 2013 01/31/13 0503  NA 134* 138  K 6.3* 4.3  CL 95* 99  CO2 19 25  GLUCOSE 176* 144*  BUN 66* 25*  CREATININE 8.18* 4.08*  CALCIUM 9.7 8.6  PHOS 6.5* 4.3   Liver Function Tests:  Recent Labs Lab 01/30/13 1541 01/30/13 2013 01/31/13 0503  AST 14  --   --   ALT 12  --   --     ALKPHOS 141*  --   --   BILITOT 1.7*  --   --   PROT 7.3  --   --   ALBUMIN 3.4* 3.2* 2.7*   CBC:  Recent Labs Lab 01/30/13 1541 01/31/13 0503  WBC 14.9* 13.9*  NEUTROABS 13.4* 11.9*  HGB 13.7 11.9*  HCT 41.7 37.1*  MCV 96.1 95.9  PLT 344 224   CBG:  Recent Labs Lab 01/31/13 0048  GLUCAP 164*   Coagulation:  Recent Labs Lab 01/30/13 1729 01/30/13 2013 01/31/13 0135 01/31/13 0503  LABPROT >90.0* 16.1* 16.2* 16.8*  INR >10.00* 1.32 1.33 1.40    Micro Results: No results found for this or any previous visit (from the past 240 hour(s)). Studies/Results: Dg Chest 2 View  01/29/2013   CLINICAL DATA:  Status post fall with right-sided pain  EXAM: CHEST  2 VIEW  COMPARISON:  June 05, 2012  FINDINGS: The lungs are hyperinflated. There is mild increased pulmonary interstitium. There is no pleural effusion or focal pneumonia. The heart size is enlarged. The aorta is tortuous. The visualized soft tissues and bones demonstrate no acute abnormality.  IMPRESSION: Cardiomegaly.  Mild interstitial edema.   Electronically Signed   By: Sherian Rein M.D.   On: 01/29/2013 14:13   Dg Ribs Bilateral W/chest  01/30/2013   CLINICAL  DATA:  Fall.  Bilateral rib pain and chest pain  EXAM: BILATERAL RIBS AND CHEST - 4+ VIEW  COMPARISON:  01/29/2013  FINDINGS: Moderate cardiac enlargement. No pleural effusion or edema identified. No airspace consolidation. Chronic interstitial coarsening is identified within the lungs. There is calcified atherosclerotic disease involving the thoracic aorta. There is an acute fracture involving the right 4th rib laterally. Anterior 5th, 6th and 7th rib fractures are also noted. There is no evidence of pneumothorax or pleural effusion.  IMPRESSION: Acute right 4th, 5th, 6th, and 7th rib fractures.   Electronically Signed   By: Signa Kell M.D.   On: 01/30/2013 16:51   Dg Ankle Complete Right  01/31/2013   CLINICAL DATA:  Swelling.  Soft tissue ulcer.  EXAM:  RIGHT ANKLE - COMPLETE 3+ VIEW  COMPARISON:  None.  FINDINGS: There is moderate arthritis of the ankle joint. No ankle joint effusion. No evidence of bone destruction to suggest osteomyelitis. Extensive arterial vascular calcifications present in the soft tissues.  IMPRESSION: No acute osseous abnormality. Specifically, no evidence of osteomyelitis. Moderate arthritis of the ankle joint.   Electronically Signed   By: Geanie Cooley M.D.   On: 01/31/2013 08:24   Ct Head Wo Contrast  01/30/2013   CLINICAL DATA:  Fall.  EXAM: CT HEAD WITHOUT CONTRAST  TECHNIQUE: Contiguous axial images were obtained from the base of the skull through the vertex without intravenous contrast.  COMPARISON:  11/29/2010.  FINDINGS: No mass. No hydrocephalus. No hemorrhage. Mild periventricular white matter changes consistent wrist chronic ischemia again noted. Diffuse cerebral cerebellar atrophy again noted. Cerebral vascular calcifications present. Orbits are unremarkable. Minimal mucosal thickening of the paranasal sinuses. Mastoids are clear. No acute bony abnormality. Stable left maxillary bony cyst, most likely odontogenic, no change from prior exam.  IMPRESSION: Stable exam with stable changes of chronic white matter ischemia and cerebral atrophy. No acute abnormality.   Electronically Signed   By: Maisie Fus  Register   On: 01/30/2013 16:42   Dg Foot 2 Views Left  01/31/2013   CLINICAL DATA:  Ulceration along the medial side of the ankle.  EXAM: LEFT FOOT - 2 VIEW  COMPARISON:  None.  FINDINGS: Vascular calcifications noted. Irregularity in the proximal metaphysis of the 4th metatarsal. Linear calcific density projecting plantar to the distal 4th metatarsal may represent a foreign body or dense focal linear calcification in the plantar soft tissues. There is deformity of the shaft of the proximal phalanx 2nd toe, query old fracture.  No malalignment at the Lisfranc joint. Accessory navicular noted. Dorsal midfoot spurring. Plantar  and Achilles calcaneal spurs. Poor definition of the subtalar joint and anterior calcaneus on the lateral projection due to obliquity. Probable tibiotalar joint effusion. No definite bony destructive findings characteristic of osteomyelitis.  IMPRESSION: 1. Today's exam focused on the foot rather than the ankle. There is some irregularity in the proximal metaphysis of the 4th metatarsal which could represent a fracture ; oblique view might help to further assess this. 2. 6 mm linear calcific density in the plantar soft tissues below the distal 4th metatarsal, suspicious for foreign body. 3. Suspected old fracture of the proximal phalanx 2nd toe. 4. Plantar and Achilles calcaneal spurs with dorsal spurring at the Lisfranc joint. 5. Vascular calcifications. 6. Difficult to assess the hindfoot on the lateral projection due to obliquity. CT or MRI may be required for comprehensive assessment.   Electronically Signed   By: Herbie Baltimore M.D.   On: 01/31/2013 08:24  Medications: I have reviewed the patient's current medications. Scheduled Meds: . calcium acetate (Phos Binder)  667 mg Oral TID WC  . [START ON 02/02/2013] doxercalciferol  3 mcg Intravenous Q M,W,F-HD  . insulin aspart  0-9 Units Subcutaneous TID WC  . insulin glargine  40 Units Subcutaneous QHS  . metoprolol  50 mg Oral BID  . multivitamin  1 tablet Oral QHS  . pantoprazole  40 mg Oral Daily  . piperacillin-tazobactam (ZOSYN)  IV  2.25 g Intravenous Q8H  . sodium chloride  3 mL Intravenous Q12H  . sodium chloride  3 mL Intravenous Q12H  . [START ON 02/02/2013] vancomycin  1,000 mg Intravenous Q M,W,F-HD  . warfarin  7.5 mg Oral ONCE-1800  . Warfarin - Pharmacist Dosing Inpatient   Does not apply q1800   Continuous Infusions:  PRN Meds:.sodium chloride, acetaminophen, acetaminophen, sodium chloride Assessment/Plan: Principal Problem:   Sepsis Active Problems:   Anemia   Hyperlipidemia   End stage renal disease   Hyperkalemia    Wound of right ankle   Open wound of left foot   SVT (supraventricular tachycardia)   Multiple rib fractures  # Sepsis 2/2 bil foot wounds and right ankle cellulitis (Wound Bil Feet)  The patient has bilateral feet wound concerning for cellulitis and possible deep infection including OM. Given tachycardia, AMS and leukocytosis, this represented sepsis that has now resolved. CXR clear aside from 4 rib fractures with no effusion, pulm edema, or consolidation concerning for PNA. Patient neck is supple. No lateralizing neuro findings, thus meningitis is less likely.  - Vanc Zosyn IV per pharm  - Hold ABX for now given no fever and isolated tachycardia  - I/O cath for UA and urine culture  - XR left foot, and XR Right ankle did not show AOM, F/U MR w/o contrst (given ESRD)  # Coumadin Therapy for Afib  Patients INR was >10, but recheck showed INR 1.32. Thus the initial was likely a lab error. As subtherapeutic plan to give coumadin per pharmacy  # ESRD c/b Hyperkalemia (resolved) Patient has missed 2 hemodialysis sessions this week. K was 6.9 but was stabilized by emergent HD.  - HD per nephrology  # Acute Encephalopathy (resolved) Etiology of patients acute encephalopathy is likely multifactorial. Likely causes include UTI, sepsis, OM of feet, Uremia, and drug side effect. Patient neck is supple. No lateralizing neuro findings, thus meningitis is less likely.  - Urgent HD  - See sepsis plan  - Hold sedative medications (gabapentin and vicodin)   # SVT (Afib with RVR) (resolved) Patients SVT is of unknown etiology (likely afib with RVR) at this time. Patient reports not taking his rate control medications in the two days before admission. Likely he has a history of SVT with RVR that is rate controlled with an atypical regimen in metoprolol 50 mg BID and labetalol 100 mg bid.  - Will hold labetalol given possible sepsis.  - Metoprolol 50 mg qd for rate control  # Rib fractures  Multiple rib  fractures. Patient is breathing comfortably in ED bed. Will hold narcotics given AMS and administer tylenol prn. No signs of paradoxical chest wall movement at this time. Thus, no concern for flail chest.   # DM II  Home lantus dose is 58 U qhs. Decreased to 40 U while inpatient. SSI sensitive. CBG qhc and hs.  - F/U A1C  # SW Patient was brought from home that is filled with trash, free excrement,  dirty diapers and  rodents. Son feels that house needs to be condemned. Patient will likely need SNF placment for rehab. - Consult SW  Dispo: Disposition is deferred at this time, awaiting improvement of current medical problems.  Anticipated discharge in approximately 2-3 day(s).   The patient does have a current PCP Aida Puffer, MD) and does not need an [facility] hospital follow-up appointment after discharge.  The patient does have transportation limitations that hinder transportation to clinic appointments.  .Services Needed at time of discharge: Y = Yes, Blank = No PT:   OT:   RN:   Equipment:   Other:     LOS: 1 day   Pleas Koch, MD 01/31/2013, 11:28 AM

## 2013-01-31 NOTE — H&P (Signed)
  Date: 01/31/2013  Patient name: Chris Vance  Medical record number: [id number]  Date of birth: 03/25/1940   I have seen and evaluated Chris Vance and discussed their care with the Residency Team.   Assessment and Plan: I have seen and evaluated the patient as outlined above. I agree with the formulated Assessment and Plan as detailed in the residents' admission note, with the following changes:   1. Severe sepsis - pt's organ dysfxn was encephalopathy. The source is most likely his feet, presumed osteo (MRI pending). He is on broad ABX since has interaction with health care settings. His CXR showed no infiltrate (pt was not vol contracted) and he has no urinary sxs (UA ordered). Cont ABX and wait on MRI.  2. Encephalopathy - likely 2/2 uremia and possibly sepsis. Seems better today. Cont treating underlying causes.  3. ESRD - HD on arrival and renal is following pt.  4. Concern for living situation and possible dementia - 5 min recall was 2 out of 3. He doesn't have his glasses so clock was a bit off but got all numbers. Social work consult. Will need to discuss with pt and son pt's driving (is he capable?? Cannot make this determination at this time) and where he will go once ready for D/C (if pt insists on going home, may be an issue). Try to get pt's glasses from home to prevent delirium.  Burns Spain, MD 12/6/201412:35 PM
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
## 2013-01-31 NOTE — H&P (Signed)
Chris Vance is an 72 y.o. male.   Chief Complaint: Scheduled for Rt arm dialysis fistulogram Mon 12/8 Slow flows per dialysis and MD Most recent note of intervention is OR 06/2010 for revision of same fistula Scheduled now for fistulogram with possible angioplasty/stent vs possible dialysis catheter placement if needed  Pt was admitted 12/4- after missing dialysis and police finding him down at home confused; bleeding from fall at home. To [place]- high potassium and high INR Now pt is alert and oriented; INR wnl; K 4.3  HPI: ESRD; DM; HTN; Afib; HLD  Past Medical History  Diagnosis Date  . Renal disorder   . Diabetes mellitus   . Hypertension   . Anemia   . Hyperlipidemia   . Thyroid disease   . Anxiety   . Skin cancer     Past Surgical History  Procedure Laterality Date  . Av fistula placement      Family History  Problem Relation Age of Onset  . Asthma Father    Social History:  reports that he has been smoking Cigarettes.  He started smoking about 55 years ago. He has a 55 pack-year smoking history. He has never used smokeless tobacco. He reports that he does not drink alcohol or use illicit drugs.  Allergies:  Allergies  Allergen Reactions  . Ace Inhibitors   . Lisinopril Cough    Medications Prior to Admission  Medication Sig Dispense Refill  . calcium acetate, Phos Binder, (PHOSLYRA) 667 MG/5ML SOLN 3 tablets daily with meals      . gabapentin (NEURONTIN) 300 MG capsule Take 300 mg by mouth at bedtime.      Marland Kitchen HYDROcodone-acetaminophen (NORCO/VICODIN) 5-325 MG per tablet Take 1 tablet by mouth every 4 (four) hours as needed.  15 tablet  0  . insulin glargine (LANTUS) 100 UNIT/ML injection Inject 58 Units into the skin at bedtime.      Marland Kitchen labetalol (NORMODYNE) 200 MG tablet Take 100 mg by mouth 2 (two) times daily. Takes 1/2 tablet twice daily.      Marland Kitchen lidocaine-prilocaine (EMLA) cream Apply 1 application topically 3 (three) times a week. Dialysis med      .  metoprolol (LOPRESSOR) 50 MG tablet Take 50 mg by mouth 2 (two) times daily.      Marland Kitchen omeprazole (PRILOSEC) 20 MG capsule Take 20 mg by mouth daily.      Marland Kitchen warfarin (COUMADIN) 5 MG tablet Take 5 mg by mouth daily.        Results for orders placed during the hospital encounter of 01/30/13 (from the past 48 hour(s))  COMPREHENSIVE METABOLIC PANEL     Status: Abnormal   Collection Time    01/30/13  3:41 PM      Result Value Range   Sodium 131 (*) 135 - 145 mEq/L   Potassium 6.9 (*) 3.5 - 5.1 mEq/L   Comment: CRITICAL RESULT CALLED TO, READ BACK BY AND VERIFIED WITH:     A.MCKEOWN,RN [id number] SHIPMANM   Chloride 91 (*) 96 - 112 mEq/L   CO2 22  19 - 32 mEq/L   Glucose, Bld 184 (*) 70 - 99 mg/dL   BUN 60 (*) 6 - 23 mg/dL   Creatinine, Ser 0.45 (*) 0.50 - 1.35 mg/dL   Calcium 9.9  8.4 - 40.9 mg/dL   Total Protein 7.3  6.0 - 8.3 g/dL   Albumin 3.4 (*) 3.5 - 5.2 g/dL   AST 14  0 - 37 U/L  ALT 12  0 - 53 U/L   Alkaline Phosphatase 141 (*) 39 - 117 U/L   Total Bilirubin 1.7 (*) 0.3 - 1.2 mg/dL   GFR calc non Af Amer 6 (*) >90 mL/min   GFR calc Af Amer 7 (*) >90 mL/min   Comment: (NOTE)     The eGFR has been calculated using the CKD EPI equation.     This calculation has not been validated in all clinical situations.     eGFR's persistently <90 mL/min signify possible Chronic Kidney     Disease.  CBC WITH DIFFERENTIAL     Status: Abnormal   Collection Time    01/30/13  3:41 PM      Result Value Range   WBC 14.9 (*) 4.0 - 10.5 K/uL   RBC 4.34  4.22 - 5.81 MIL/uL   Hemoglobin 13.7  13.0 - 17.0 g/dL   HCT 98.1  19.1 - 47.8 %   MCV 96.1  78.0 - 100.0 fL   MCH 31.6  26.0 - 34.0 pg   MCHC 32.9  30.0 - 36.0 g/dL   RDW 29.5 (*) 62.1 - 30.8 %   Platelets 344  150 - 400 K/uL   Comment: REPEATED TO VERIFY   Neutrophils Relative % 90 (*) 43 - 77 %   Neutro Abs 13.4 (*) 1.7 - 7.7 K/uL   Lymphocytes Relative 5 (*) 12 - 46 %   Lymphs Abs 0.8  0.7 - 4.0 K/uL   Monocytes Relative 4  3 - 12 %    Monocytes Absolute 0.6  0.1 - 1.0 K/uL   Eosinophils Relative 0  0 - 5 %   Eosinophils Absolute 0.0  0.0 - 0.7 K/uL   Basophils Relative 0  0 - 1 %   Basophils Absolute 0.0  0.0 - 0.1 K/uL  PROTIME-INR     Status: Abnormal   Collection Time    01/30/13  5:29 PM      Result Value Range   Prothrombin Time >90.0 (*) 11.6 - 15.2 seconds   INR >10.00 (*) 0.00 - 1.49   Comment: REPEATED TO VERIFY     CRITICAL RESULT CALLED TO, READ BACK BY AND VERIFIED WITH:     N BULLOCK RN 1848 01/30/13 A BROWNING  PROTIME-INR     Status: Abnormal   Collection Time    01/30/13  8:13 PM      Result Value Range   Prothrombin Time 16.1 (*) 11.6 - 15.2 seconds   INR 1.32  0.00 - 1.49  RENAL FUNCTION PANEL     Status: Abnormal   Collection Time    01/30/13  8:13 PM      Result Value Range   Sodium 134 (*) 135 - 145 mEq/L   Potassium 6.3 (*) 3.5 - 5.1 mEq/L   Comment: CRITICAL RESULT CALLED TO, READ BACK BY AND VERIFIED WITH:     Tasia Catchings [id number] SHIPMANM   Chloride 95 (*) 96 - 112 mEq/L   CO2 19  19 - 32 mEq/L   Glucose, Bld 176 (*) 70 - 99 mg/dL   BUN 66 (*) 6 - 23 mg/dL   Creatinine, Ser 9.62 (*) 0.50 - 1.35 mg/dL   Calcium 9.7  8.4 - 95.2 mg/dL   Phosphorus 6.5 (*) 2.3 - 4.6 mg/dL   Albumin 3.2 (*) 3.5 - 5.2 g/dL   GFR calc non Af Amer 6 (*) >90 mL/min   GFR calc Af Amer 7 (*) >90 mL/min  Comment: (NOTE)     The eGFR has been calculated using the CKD EPI equation.     This calculation has not been validated in all clinical situations.     eGFR's persistently <90 mL/min signify possible Chronic Kidney     Disease.  GLUCOSE, CAPILLARY     Status: Abnormal   Collection Time    01/31/13 12:48 AM      Result Value Range   Glucose-Capillary 164 (*) 70 - 99 mg/dL  PROTIME-INR     Status: Abnormal   Collection Time    01/31/13  1:35 AM      Result Value Range   Prothrombin Time 16.2 (*) 11.6 - 15.2 seconds   INR 1.33  0.00 - 1.49  RENAL FUNCTION PANEL     Status: Abnormal    Collection Time    01/31/13  5:03 AM      Result Value Range   Sodium 138  135 - 145 mEq/L   Potassium 4.3  3.5 - 5.1 mEq/L   Comment: DELTA CHECK NOTED   Chloride 99  96 - 112 mEq/L   CO2 25  19 - 32 mEq/L   Glucose, Bld 144 (*) 70 - 99 mg/dL   BUN 25 (*) 6 - 23 mg/dL   Comment: DELTA CHECK NOTED   Creatinine, Ser 4.08 (*) 0.50 - 1.35 mg/dL   Comment: DELTA CHECK NOTED   Calcium 8.6  8.4 - 10.5 mg/dL   Phosphorus 4.3  2.3 - 4.6 mg/dL   Albumin 2.7 (*) 3.5 - 5.2 g/dL   GFR calc non Af Amer 13 (*) >90 mL/min   GFR calc Af Amer 16 (*) >90 mL/min   Comment: (NOTE)     The eGFR has been calculated using the CKD EPI equation.     This calculation has not been validated in all clinical situations.     eGFR's persistently <90 mL/min signify possible Chronic Kidney     Disease.  PROTIME-INR     Status: Abnormal   Collection Time    01/31/13  5:03 AM      Result Value Range   Prothrombin Time 16.8 (*) 11.6 - 15.2 seconds   INR 1.40  0.00 - 1.49  CBC WITH DIFFERENTIAL     Status: Abnormal   Collection Time    01/31/13  5:03 AM      Result Value Range   WBC 13.9 (*) 4.0 - 10.5 K/uL   RBC 3.87 (*) 4.22 - 5.81 MIL/uL   Hemoglobin 11.9 (*) 13.0 - 17.0 g/dL   HCT 09.8 (*) 11.9 - 14.7 %   MCV 95.9  78.0 - 100.0 fL   MCH 30.7  26.0 - 34.0 pg   MCHC 32.1  30.0 - 36.0 g/dL   RDW 82.9 (*) 56.2 - 13.0 %   Platelets 224  150 - 400 K/uL   Comment: DELTA CHECK NOTED     REPEATED TO VERIFY   Neutrophils Relative % 86 (*) 43 - 77 %   Lymphocytes Relative 7 (*) 12 - 46 %   Monocytes Relative 7  3 - 12 %   Eosinophils Relative 0  0 - 5 %   Basophils Relative 0  0 - 1 %   Neutro Abs 11.9 (*) 1.7 - 7.7 K/uL   Lymphs Abs 1.0  0.7 - 4.0 K/uL   Monocytes Absolute 1.0  0.1 - 1.0 K/uL   Eosinophils Absolute 0.0  0.0 - 0.7 K/uL   Basophils  Absolute 0.0  0.0 - 0.1 K/uL   Smear Review LARGE PLATELETS PRESENT     Dg Chest 2 View  01/29/2013   CLINICAL DATA:  Status post fall with right-sided pain   EXAM: CHEST  2 VIEW  COMPARISON:  June 05, 2012  FINDINGS: The lungs are hyperinflated. There is mild increased pulmonary interstitium. There is no pleural effusion or focal pneumonia. The heart size is enlarged. The aorta is tortuous. The visualized soft tissues and bones demonstrate no acute abnormality.  IMPRESSION: Cardiomegaly.  Mild interstitial edema.   Electronically Signed   By: Sherian Rein M.D.   On: 01/29/2013 14:13   Dg Ribs Bilateral W/chest  01/30/2013   CLINICAL DATA:  Fall.  Bilateral rib pain and chest pain  EXAM: BILATERAL RIBS AND CHEST - 4+ VIEW  COMPARISON:  01/29/2013  FINDINGS: Moderate cardiac enlargement. No pleural effusion or edema identified. No airspace consolidation. Chronic interstitial coarsening is identified within the lungs. There is calcified atherosclerotic disease involving the thoracic aorta. There is an acute fracture involving the right 4th rib laterally. Anterior 5th, 6th and 7th rib fractures are also noted. There is no evidence of pneumothorax or pleural effusion.  IMPRESSION: Acute right 4th, 5th, 6th, and 7th rib fractures.   Electronically Signed   By: Signa Kell M.D.   On: 01/30/2013 16:51   Dg Ankle Complete Right  01/31/2013   CLINICAL DATA:  Swelling.  Soft tissue ulcer.  EXAM: RIGHT ANKLE - COMPLETE 3+ VIEW  COMPARISON:  None.  FINDINGS: There is moderate arthritis of the ankle joint. No ankle joint effusion. No evidence of bone destruction to suggest osteomyelitis. Extensive arterial vascular calcifications present in the soft tissues.  IMPRESSION: No acute osseous abnormality. Specifically, no evidence of osteomyelitis. Moderate arthritis of the ankle joint.   Electronically Signed   By: Geanie Cooley M.D.   On: 01/31/2013 08:24   Ct Head Wo Contrast  01/30/2013   CLINICAL DATA:  Fall.  EXAM: CT HEAD WITHOUT CONTRAST  TECHNIQUE: Contiguous axial images were obtained from the base of the skull through the vertex without intravenous contrast.   COMPARISON:  11/29/2010.  FINDINGS: No mass. No hydrocephalus. No hemorrhage. Mild periventricular white matter changes consistent wrist chronic ischemia again noted. Diffuse cerebral cerebellar atrophy again noted. Cerebral vascular calcifications present. Orbits are unremarkable. Minimal mucosal thickening of the paranasal sinuses. Mastoids are clear. No acute bony abnormality. Stable left maxillary bony cyst, most likely odontogenic, no change from prior exam.  IMPRESSION: Stable exam with stable changes of chronic white matter ischemia and cerebral atrophy. No acute abnormality.   Electronically Signed   By: Maisie Fus  Register   On: 01/30/2013 16:42   Dg Foot 2 Views Left  01/31/2013   CLINICAL DATA:  Ulceration along the medial side of the ankle.  EXAM: LEFT FOOT - 2 VIEW  COMPARISON:  None.  FINDINGS: Vascular calcifications noted. Irregularity in the proximal metaphysis of the 4th metatarsal. Linear calcific density projecting plantar to the distal 4th metatarsal may represent a foreign body or dense focal linear calcification in the plantar soft tissues. There is deformity of the shaft of the proximal phalanx 2nd toe, query old fracture.  No malalignment at the Lisfranc joint. Accessory navicular noted. Dorsal midfoot spurring. Plantar and Achilles calcaneal spurs. Poor definition of the subtalar joint and anterior calcaneus on the lateral projection due to obliquity. Probable tibiotalar joint effusion. No definite bony destructive findings characteristic of osteomyelitis.  IMPRESSION: 1. Today's exam  focused on the foot rather than the ankle. There is some irregularity in the proximal metaphysis of the 4th metatarsal which could represent a fracture ; oblique view might help to further assess this. 2. 6 mm linear calcific density in the plantar soft tissues below the distal 4th metatarsal, suspicious for foreign body. 3. Suspected old fracture of the proximal phalanx 2nd toe. 4. Plantar and Achilles  calcaneal spurs with dorsal spurring at the Lisfranc joint. 5. Vascular calcifications. 6. Difficult to assess the hindfoot on the lateral projection due to obliquity. CT or MRI may be required for comprehensive assessment.   Electronically Signed   By: Herbie Baltimore M.D.   On: 01/31/2013 08:24    Review of Systems  Constitutional: Positive for weight loss. Negative for fever.  Respiratory: Negative for sputum production and shortness of breath.   Cardiovascular: Positive for chest pain.  Gastrointestinal: Negative for nausea, vomiting and abdominal pain.  Musculoskeletal: Positive for back pain, falls, joint pain and neck pain.  Neurological: Positive for weakness. Negative for dizziness.  Endo/Heme/Allergies: Bruises/bleeds easily.  Psychiatric/Behavioral:       Pt was admitted for confusion and high potassium---now alert and oriented    Blood pressure 138/67, pulse 83, temperature 98.1 F (36.7 C), temperature source Oral, resp. rate 16, height 5\' 10"  (1.778 m), weight 182 lb 1.6 oz (82.6 kg), SpO2 98.00%. Physical Exam  Constitutional: He is oriented to person, place, and time. He appears well-developed.  Cardiovascular: Normal rate.   Irreg  Respiratory: Effort normal. He has wheezes.  GI: Soft. Bowel sounds are normal. There is no tenderness.  Musculoskeletal: Normal range of motion. He exhibits tenderness.  Palpable thrill in distal fistula Tender Rt chest wall and back-- fx ribs  Neurological: He is alert and oriented to person, place, and time.  Skin: Skin is dry.  B foot wounds  Psychiatric: He has a normal mood and affect. His behavior is normal. Thought content normal.     Assessment/Plan Rt arm dialysis fistula- slow flows per dialysis Scheduled for fistulogram with poss pta/stent placement 12/8 Poss dial catheter if needed Pt aware of procedure benefits and risks and agreeable to proceed Consent signed and in chart  Estevon Fluke A 01/31/2013, 8:32 AM

## 2013-01-31 NOTE — Progress Notes (Signed)
ANTIBIOTIC CONSULT NOTE - INITIAL  Pharmacy Consult for Vancocin and Zosyn Indication: rule out pneumonia and foot infection  Allergies  Allergen Reactions  . Ace Inhibitors   . Lisinopril Cough    Patient Measurements: Height: 5\' 10"  (177.8 cm) Weight: 183 lb 3.2 oz (83.1 kg) IBW/kg (Calculated) : 73  Vital Signs: Temp: 97.9 F (36.6 C) (12/05 2000) Temp src: Oral (12/05 2000) BP: 116/76 mmHg (12/06 0001) Pulse Rate: 108 (12/06 0001)  Labs:  Recent Labs  01/29/13 1255 01/30/13 1541 01/30/13 2013  WBC 8.6 14.9*  --   HGB 11.8* 13.7  --   PLT 234 344  --   CREATININE 7.04* 8.02* 8.18*   Estimated Creatinine Clearance: 8.6 ml/min (by C-G formula based on Cr of 8.18).   Microbiology: No results found for this or any previous visit (from the past 720 hour(s)).  Medical History: Past Medical History  Diagnosis Date  . Renal disorder   . Diabetes mellitus   . Hypertension   . Anemia   . Hyperlipidemia   . Thyroid disease   . Anxiety   . Skin cancer     Medications:  Prescriptions prior to admission  Medication Sig Dispense Refill  . calcium acetate, Phos Binder, (PHOSLYRA) 667 MG/5ML SOLN 3 tablets daily with meals      . gabapentin (NEURONTIN) 300 MG capsule Take 300 mg by mouth at bedtime.      Marland Kitchen HYDROcodone-acetaminophen (NORCO/VICODIN) 5-325 MG per tablet Take 1 tablet by mouth every 4 (four) hours as needed.  15 tablet  0  . insulin glargine (LANTUS) 100 UNIT/ML injection Inject 58 Units into the skin at bedtime.      Marland Kitchen labetalol (NORMODYNE) 200 MG tablet Take 100 mg by mouth 2 (two) times daily. Takes 1/2 tablet twice daily.      Marland Kitchen lidocaine-prilocaine (EMLA) cream Apply 1 application topically 3 (three) times a week. Dialysis med      . metoprolol (LOPRESSOR) 50 MG tablet Take 50 mg by mouth 2 (two) times daily.      Marland Kitchen omeprazole (PRILOSEC) 20 MG capsule Take 20 mg by mouth daily.      Marland Kitchen warfarin (COUMADIN) 5 MG tablet Take 5 mg by mouth daily.        Scheduled:  . calcium acetate (Phos Binder)  667 mg Oral TID WC  . [START ON 02/02/2013] doxercalciferol  3 mcg Intravenous Q M,W,F-HD  . insulin aspart  0-9 Units Subcutaneous TID WC  . insulin glargine  40 Units Subcutaneous QHS  . metoprolol  50 mg Oral BID  . pantoprazole  40 mg Oral Daily  . sodium chloride  3 mL Intravenous Q12H  . sodium chloride  3 mL Intravenous Q12H  . warfarin  7.5 mg Oral ONCE-1800  . Warfarin - Pharmacist Dosing Inpatient   Does not apply q1800    Assessment: 72yo male fell a few days ago and was treated w/ pain control, subsequently missed two HD sessions so police were sent to pt's home where he was found in dire living conditions with confusion and weakness, brought to ED and found to be in Afib and life-threatening hyperkalemia w/ urgent HD completed tonight, now to begin IV ABX for possible PNA and foot wound infection.  Goal of Therapy:  Pre-HD vanc level 15-25  Plan:  Will give vancomycin 1750mg  IV x1 now then begin vanc 1g after each HD as well as Zosyn 2.25g IV Q8H and monitor CBC, Cx, levels prn.  Vernard Gambles, PharmD, BCPS  01/31/2013,12:33 AM

## 2013-02-01 ENCOUNTER — Inpatient Hospital Stay (HOSPITAL_COMMUNITY): Payer: Medicare Other

## 2013-02-01 LAB — GLUCOSE, CAPILLARY
Glucose-Capillary: 140 mg/dL — ABNORMAL HIGH (ref 70–99)
Glucose-Capillary: 120 mg/dL — ABNORMAL HIGH (ref 70–99)

## 2013-02-01 LAB — CBC
HCT: 38.5 % — ABNORMAL LOW (ref 39.0–52.0)
Hemoglobin: 12.5 g/dL — ABNORMAL LOW (ref 13.0–17.0)
MCH: 31.3 pg (ref 26.0–34.0)
MCHC: 32.5 g/dL (ref 30.0–36.0)
MCV: 96.5 fL (ref 78.0–100.0)
WBC: 14.4 10*3/uL — ABNORMAL HIGH (ref 4.0–10.5)

## 2013-02-01 LAB — RENAL FUNCTION PANEL
BUN: 43 mg/dL — ABNORMAL HIGH (ref 6–23)
CO2: 24 mEq/L (ref 19–32)
Calcium: 9 mg/dL (ref 8.4–10.5)
Creatinine, Ser: 6.11 mg/dL — ABNORMAL HIGH (ref 0.50–1.35)
GFR calc non Af Amer: 8 mL/min — ABNORMAL LOW (ref >90)
Glucose, Bld: 152 mg/dL — ABNORMAL HIGH (ref 70–99)

## 2013-02-01 LAB — PROTIME-INR: Prothrombin Time: 18.8 seconds — ABNORMAL HIGH (ref 11.6–15.2)

## 2013-02-01 MED ORDER — CALCIUM ACETATE (PHOS BINDER) 667 MG/5ML PO SOLN: 1334.0000 mg | Freq: Three times a day (TID) | ORAL | Status: DC

## 2013-02-01 MED ORDER — HYDROCODONE-ACETAMINOPHEN 5-325 MG PO TABS
1.0000 | ORAL_TABLET | Freq: Four times a day (QID) | ORAL | Status: DC | PRN
  Administered 2013-02-01 – 2013-02-02 (×2): 1 via ORAL
  Filled 2013-02-01 (×2): qty 1

## 2013-02-01 MED ORDER — BOOST / RESOURCE BREEZE PO LIQD
1.0000 | Freq: Three times a day (TID) | ORAL | Status: DC
  Administered 2013-02-01 – 2013-02-17 (×20): 1 via ORAL

## 2013-02-01 MED ORDER — CALCIUM ACETATE 667 MG PO CAPS
1334.0000 mg | ORAL_CAPSULE | Freq: Three times a day (TID) | ORAL | Status: DC
  Administered 2013-02-01 – 2013-02-05 (×5): 1334 mg via ORAL
  Filled 2013-02-01 (×14): qty 2

## 2013-02-01 MED ORDER — WARFARIN SODIUM 5 MG PO TABS
5.0000 mg | ORAL_TABLET | Freq: Once | ORAL | Status: AC
  Administered 2013-02-01: 5 mg via ORAL
  Filled 2013-02-01: qty 1

## 2013-02-01 NOTE — Progress Notes (Addendum)
New parameters for heart rate placed. Per Chikowski MD, please notify MD if HR >120 IF sustained longer than 5 minutes, or if HR is ever above 140. CCMD notified of new parameters. Will continue to monitor. Gilman Schmidt

## 2013-02-01 NOTE — Consult Note (Signed)
ANTICOAGULATION CONSULT NOTE - Initial Consult  Pharmacy Consult for Coumadin Indication: atrial fibrillation  Allergies  Allergen Reactions  . Ace Inhibitors   . Lisinopril Cough    Vital Signs: Temp: 98.4 F (36.9 C) (12/07 1000) Temp src: Oral (12/07 1000) BP: 112/72 mmHg (12/07 1000) Pulse Rate: 110 (12/07 1000)  Labs:  Recent Labs  01/30/13 1541  01/30/13 2013 01/31/13 0135 01/31/13 0503 02/01/13 0815  HGB 13.7  --   --   --  11.9* 12.5*  HCT 41.7  --   --   --  37.1* 38.5*  PLT 344  --   --   --  224 246  LABPROT  --   < > 16.1* 16.2* 16.8* 18.8*  INR  --   < > 1.32 1.33 1.40 1.62*  CREATININE 8.02*  --  8.18*  --  4.08* 6.11*  < > = values in this interval not displayed.  Estimated Creatinine Clearance: 11.4 ml/min (by C-G formula based on Cr of 6.11).   Medical History: Past Medical History  Diagnosis Date  . Renal disorder   . Diabetes mellitus   . Hypertension   . Anemia   . Hyperlipidemia   . Thyroid disease   . Anxiety   . Skin cancer    Assessment: 72yom on coumadin for afib. INR 1.62. Hgb/plt stable, No bleeding noted per chart. Also on sq heparin for VTE px, missed a dose on 12/5, the day of admission  Home dose = 5mg  daily  Goal of Therapy:   INR 2-3 Monitor platelets by anticoagulation protocol: Yes   Plan:  1) Coumadin 5mg  x 1 tonight 2) Daily INR  Bayard Hugger, PharmD, BCPS  Clinical Pharmacist  Pager: [phone]   02/01/2013,11:32 AM

## 2013-02-01 NOTE — Progress Notes (Signed)
Subjective: States that he was anxious to go home yesterday and may have been rude to medical staff. He apologized for it but states that wanted more "professiolism" from medical staff. Continues to have pain in right chest. Denies confusion, shortness of breath, abdominal pain, N/V/D.   Objective: Vital signs in last 24 hours: Filed Vitals:   01/31/13 1715 01/31/13 2053 02/01/13 0417 02/01/13 1000  BP: 127/77 120/60 100/60 112/72  Pulse: 108 100 112 110  Temp: 98.1 F (36.7 C) 98 F (36.7 C) 98 F (36.7 C) 98.4 F (36.9 C)  TempSrc: Oral Oral Oral Oral  Resp: 18 18 18 18   Height:  5\' 10"  (1.778 m)    Weight:  182 lb 1.6 oz (82.6 kg)    SpO2: 95% 96% 96% 97%   Weight change: -1 lb 1.6 oz (-0.5 kg)  Intake/Output Summary (Last 24 hours) at 02/01/13 1249 Last data filed at 02/01/13 0900  Gross per 24 hour  Intake    410 ml  Output      0 ml  Net    410 ml   Vitals reviewed. General: resting in bed, in NAD.  HEENT: no scleral icterus Cardiac: RRR, no rubs, murmurs or gallops. JVD + Pulm: bibasilar crackles anteriorly, scattered wheezes. No respiratory distress.  Abd: soft, nontender, nondistended, BS present Ext: warm and well perfused, trace pitting edema bilaterally. Erythema, increased warmth of the right ankle with wound in right malleolus covered with eschar. Left lateral foot with 5mm full thickeness minimum ss output.  Neuro: alert and oriented X3, cranial nerves II-XII grossly intact, moves all extremities voluntarily.   Lab Results: Basic Metabolic Panel:  Recent Labs Lab 01/31/13 0503 02/01/13 0815  NA 138 136  K 4.3 4.5  CL 99 95*  CO2 25 24  GLUCOSE 144* 152*  BUN 25* 43*  CREATININE 4.08* 6.11*  CALCIUM 8.6 9.0  PHOS 4.3 5.7*   Liver Function Tests: CBC:  Recent Labs Lab 01/30/13 1541 01/31/13 0503 02/01/13 0815  WBC 14.9* 13.9* 14.4*  NEUTROABS 13.4* 11.9*  --   HGB 13.7 11.9* 12.5*  HCT 41.7 37.1* 38.5*  MCV 96.1 95.9 96.5  PLT 344  224 246   CBG:  Recent Labs Lab 01/31/13 0829 01/31/13 1135 01/31/13 1607 01/31/13 2057 02/01/13 0734 02/01/13 1130  GLUCAP 207* 168* 138* 177* 124* 137*   Hemoglobin A1C:  Recent Labs Lab 01/31/13 0135  HGBA1C 6.5*   Coagulation:  Recent Labs Lab 01/30/13 2013 01/31/13 0135 01/31/13 0503 02/01/13 0815  LABPROT 16.1* 16.2* 16.8* 18.8*  INR 1.32 1.33 1.40 1.62*    Studies/Results: Dg Ribs Bilateral W/chest  01/30/2013   CLINICAL DATA:  Fall.  Bilateral rib pain and chest pain  EXAM: BILATERAL RIBS AND CHEST - 4+ VIEW  COMPARISON:  01/29/2013  FINDINGS: Moderate cardiac enlargement. No pleural effusion or edema identified. No airspace consolidation. Chronic interstitial coarsening is identified within the lungs. There is calcified atherosclerotic disease involving the thoracic aorta. There is an acute fracture involving the right 4th rib laterally. Anterior 5th, 6th and 7th rib fractures are also noted. There is no evidence of pneumothorax or pleural effusion.  IMPRESSION: Acute right 4th, 5th, 6th, and 7th rib fractures.   Electronically Signed   By: Signa Kell M.D.   On: 01/30/2013 16:51   Dg Ankle Complete Right  01/31/2013   CLINICAL DATA:  Swelling.  Soft tissue ulcer.  EXAM: RIGHT ANKLE - COMPLETE 3+ VIEW  COMPARISON:  None.  FINDINGS: There is moderate arthritis of the ankle joint. No ankle joint effusion. No evidence of bone destruction to suggest osteomyelitis. Extensive arterial vascular calcifications present in the soft tissues.  IMPRESSION: No acute osseous abnormality. Specifically, no evidence of osteomyelitis. Moderate arthritis of the ankle joint.   Electronically Signed   By: Geanie Cooley M.D.   On: 01/31/2013 08:24   Ct Head Wo Contrast  01/30/2013   CLINICAL DATA:  Fall.  EXAM: CT HEAD WITHOUT CONTRAST  TECHNIQUE: Contiguous axial images were obtained from the base of the skull through the vertex without intravenous contrast.  COMPARISON:  11/29/2010.   FINDINGS: No mass. No hydrocephalus. No hemorrhage. Mild periventricular white matter changes consistent wrist chronic ischemia again noted. Diffuse cerebral cerebellar atrophy again noted. Cerebral vascular calcifications present. Orbits are unremarkable. Minimal mucosal thickening of the paranasal sinuses. Mastoids are clear. No acute bony abnormality. Stable left maxillary bony cyst, most likely odontogenic, no change from prior exam.  IMPRESSION: Stable exam with stable changes of chronic white matter ischemia and cerebral atrophy. No acute abnormality.   Electronically Signed   By: Maisie Fus  Register   On: 01/30/2013 16:42   Dg Foot 2 Views Left  01/31/2013   CLINICAL DATA:  Ulceration along the medial side of the ankle.  EXAM: LEFT FOOT - 2 VIEW  COMPARISON:  None.  FINDINGS: Vascular calcifications noted. Irregularity in the proximal metaphysis of the 4th metatarsal. Linear calcific density projecting plantar to the distal 4th metatarsal may represent a foreign body or dense focal linear calcification in the plantar soft tissues. There is deformity of the shaft of the proximal phalanx 2nd toe, query old fracture.  No malalignment at the Lisfranc joint. Accessory navicular noted. Dorsal midfoot spurring. Plantar and Achilles calcaneal spurs. Poor definition of the subtalar joint and anterior calcaneus on the lateral projection due to obliquity. Probable tibiotalar joint effusion. No definite bony destructive findings characteristic of osteomyelitis.  IMPRESSION: 1. Today's exam focused on the foot rather than the ankle. There is some irregularity in the proximal metaphysis of the 4th metatarsal which could represent a fracture ; oblique view might help to further assess this. 2. 6 mm linear calcific density in the plantar soft tissues below the distal 4th metatarsal, suspicious for foreign body. 3. Suspected old fracture of the proximal phalanx 2nd toe. 4. Plantar and Achilles calcaneal spurs with dorsal  spurring at the Lisfranc joint. 5. Vascular calcifications. 6. Difficult to assess the hindfoot on the lateral projection due to obliquity. CT or MRI may be required for comprehensive assessment.   Electronically Signed   By: Herbie Baltimore M.D.   On: 01/31/2013 08:24   Medications: I have reviewed the patient's current medications. Scheduled Meds: . calcium acetate  1,334 mg Oral TID WC  . [START ON 02/02/2013] doxercalciferol  3 mcg Intravenous Q M,W,F-HD  . feeding supplement (RESOURCE BREEZE)  1 Container Oral TID WC  . heparin subcutaneous  5,000 Units Subcutaneous Q8H  . insulin aspart  0-9 Units Subcutaneous TID WC  . insulin glargine  40 Units Subcutaneous QHS  . metoprolol  50 mg Oral BID  . multivitamin  1 tablet Oral QHS  . pantoprazole  40 mg Oral Daily  . piperacillin-tazobactam (ZOSYN)  IV  2.25 g Intravenous Q8H  . sodium chloride  3 mL Intravenous Q12H  . sodium chloride  3 mL Intravenous Q12H  . [START ON 02/02/2013] vancomycin  1,000 mg Intravenous Q M,W,F-HD  . warfarin  5 mg  Oral ONCE-1800  . Warfarin - Pharmacist Dosing Inpatient   Does not apply q1800   Continuous Infusions:  PRN Meds:.sodium chloride, acetaminophen, acetaminophen, sodium chloride Assessment/Plan: Mr. Crist is a 72 year old man with PMH of A.fib (on coumadin, BB), ESRD on HD, DM2, presenting with encephalopathy and sepsis due to bilateral foot ulcers.   Sepsis 2/2 bil foot wounds and right ankle cellulitis (Wound Bil Feet)  The patient has bilateral feet wound concerning for cellulitis and possible deep infection including OM. Given tachycardia, AMS and leukocytosis, this represented severe sepsis that has now resolved. CXR clear aside from 4 rib fractures with no effusion, pulm edema, or consolidation concerning for PNA. Patient's neck is supple. No lateralizing neuro findings, thus meningitis is less likely. XR of Left foot and Right ankle did not rule in/out OM. He continues to improve clinically,  MRI bl feet pending.  - Continue Vanc Zosyn IV per pharm  - I/O cath for UA and urine culture, yet to be collected but pt has not suprapubic tenderness.   Hyperkalemia - (resolved). He had missed 2 hemodialysis sessions the week prior to his presentation with K of 6.9 that has neem now stabilized after emergent HD.  - HD per nephrology, plan for another session on Monday with fistulagram.   Acute Encephalopathy (resolved)  Etiology of patients acute encephalopathy is likely multifactorial. Likely causes include UTI, sepsis, OM of feet, Uremia, and drug side effect. Patient's neck is supple. No lateralizing neuro findings, thus meningitis is less likely. Mental status has much improved thought he may have cognitive impairment at baseline. Mini Cog with normal clock face, 2/3 five minute recall.  - See sepsis plan  - Will start pain medication slowly with Vicodin given multiple rib fractures and assess for mental status changes.    SVT (Afib with RVR) (resolved)  Patients SVT is of unknown etiology (likely afib with RVR) at this time. Patient reports not taking his rate control medications in the two days before admission. Likely he has a history of SVT with RVR that is rate controlled with an atypical regimen in metoprolol 50 mg BID and labetalol 100 mg bid.  - Will hold labetalol given possible sepsis.  - Metoprolol 50 mg bid for rate control   Rib fractures  Multiple rib fractures. Patient is breathing comfortably. Will hold narcotics given AMS and administer tylenol prn. No signs of paradoxical chest wall movement at this time. Thus, no concern for flail chest. Of note, binders are contraindicated as they may increase risk of respiratory complications. His pain is not well controlled with Tylenol alone and he is at risk of acute delirium 2/2 pain.  -Start small dose of Vicodin -Incentive spirometry  Supra therapeutic INR - He is on coumadin Therapy for Afib  Patients INR was >10, but recheck  showed INR 1.32. Thus the initial was likely a lab error. Remains subtherapeutic with INR of 1.62 today.  -Warfarin per pharmacy  DM II  Home lantus dose is 58 U qhs. Decreased to 40 U while inpatient. SSI sensitive. CBG qhc and hs. Hg A1C of 6.5%  SW  Patient was brought from home that is filled with trash, free excrement, dirty diapers and rodents. Son feels that house needs to be condemned. Patient will likely need SNF placement for rehab.  - Consult SW   Dispo: Disposition is deferred at this time, awaiting improvement of current medical problems.   The patient does have a current PCP Aida Puffer, MD)  and does not need an [facility] hospital follow-up appointment after discharge.  The patient does have transportation limitations that hinder transportation to clinic appointments.  .Services Needed at time of discharge: Y = Yes, Blank = No PT: Pending eval  OT: Pending eval  RN:   Equipment: Pending eval  Other:     LOS: 2 days   Ky Barban, MD 02/01/2013, 12:49 PM

## 2013-02-01 NOTE — Progress Notes (Signed)
Subjective:  Reports pain is better overall Objective Vital signs in last 24 hours: Filed Vitals:   01/31/13 1715 01/31/13 2053 02/01/13 0417 02/01/13 1000  BP: 127/77 120/60 100/60 112/72  Pulse: 108 100 112 110  Temp: 98.1 F (36.7 C) 98 F (36.7 C) 98 F (36.7 C) 98.4 F (36.9 C)  TempSrc: Oral Oral Oral Oral  Resp: 18 18 18 18   Height:  5\' 10"  (1.778 m)    Weight:  82.6 kg (182 lb 1.6 oz)    SpO2: 95% 96% 96% 97%   Weight change: -0.5 kg (-1 lb 1.6 oz)  Intake/Output Summary (Last 24 hours) at 02/01/13 1113 Last data filed at 02/01/13 0900  Gross per 24 hour  Intake    410 ml  Output      0 ml  Net    410 ml   Labs: Basic Metabolic Panel:  Recent Labs Lab 01/30/13 2013 01/31/13 0503 02/01/13 0815  NA 134* 138 136  K 6.3* 4.3 4.5  CL 95* 99 95*  CO2 19 25 24   GLUCOSE 176* 144* 152*  BUN 66* 25* 43*  CREATININE 8.18* 4.08* 6.11*  CALCIUM 9.7 8.6 9.0  PHOS 6.5* 4.3 5.7*   Liver Function Tests:  Recent Labs Lab 01/30/13 1541 01/30/13 2013 01/31/13 0503 02/01/13 0815  AST 14  --   --   --   ALT 12  --   --   --   ALKPHOS 141*  --   --   --   BILITOT 1.7*  --   --   --   PROT 7.3  --   --   --   ALBUMIN 3.4* 3.2* 2.7* 2.5*    Recent Labs Lab 01/29/13 1255 01/30/13 1541 01/31/13 0503 02/01/13 0815  WBC 8.6 14.9* 13.9* 14.4*  NEUTROABS 7.0 13.4* 11.9*  --   HGB 11.8* 13.7 11.9* 12.5*  HCT 37.4* 41.7 37.1* 38.5*  MCV 97.4 96.1 95.9 96.5  PLT 234 344 224 246  CBG:  Recent Labs Lab 01/31/13 0829 01/31/13 1135 01/31/13 1607 01/31/13 2057 02/01/13 0734  GLUCAP 207* 168* 138* 177* 124*    Medications:   . calcium acetate (Phos Binder)  667 mg Oral TID WC  . [START ON 02/02/2013] doxercalciferol  3 mcg Intravenous Q M,W,F-HD  . heparin subcutaneous  5,000 Units Subcutaneous Q8H  . insulin aspart  0-9 Units Subcutaneous TID WC  . insulin glargine  40 Units Subcutaneous QHS  . metoprolol  50 mg Oral BID  . multivitamin  1 tablet Oral QHS   . pantoprazole  40 mg Oral Daily  . piperacillin-tazobactam (ZOSYN)  IV  2.25 g Intravenous Q8H  . sodium chloride  3 mL Intravenous Q12H  . sodium chloride  3 mL Intravenous Q12H  . [START ON 02/02/2013] vancomycin  1,000 mg Intravenous Q M,W,F-HD  . Warfarin - Pharmacist Dosing Inpatient   Does not apply q1800   Physical Exam:  General: Alert WM Chronically ill , NAD , more animated today Heart: IRREg, Irreg,  heart Rate 29s' with my exam, no rub  Lungs: Decreased BS at Bases and somewhat poor resp. effort sec to rib pain  Abdomen: BS pos, soft, improvement  With  tenderness epigastric no rebound  Extremities: Dialysis Access: Bipedal edema 1= with R Malleolar ulcer and scattered Punctate ulcers/ L foot Bottom of foot superfiscial lesions dry / pos. 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   Outpatient dialysis schedule:  MWF [facility] 3 3/4 hours 180NR BFR 450 A1.5 EDW 81 kg 3K2.25 Ca dialysate UF profile 4 Machine temp 36 Access right AVF Needle gauge 15 Meds: doxercalciferol 3 mcg TIW, heparin 7800 units  Problem/Plan:  1. Hyperkalemia- sec to missed hd  On admit k=7.9  Improved with hd / Am lab 4.5( CHANGE OUT PT BATH TO 2,0K AT DC )/ fu labs pre hd in am 2. ESRD - MWF HD at [facility]; HD evening of admit sec life-threatening hyperkalemia From missed HD/ k and vol okay this am/ PER op dialysis records needs fistulogram  to evaluate a drop in access flows - IR plans to do  Monday.  3. Confusion/AMS - MS Back to Baseline and more animated this am/ At outpt. Unit noted Dementia over past year /CT in the ED negative for acute process; admit team further w/u pending; suspected uremia from missed hd/ component of dementia; infection may be playing a role. 4. Right malleolar ulcer/RLE cellulitis - On Vanco and Zosyn//right ankle no Osteo on plain flim / admit team wu  5. Atrial fib with RVR on coumadin with supratherapeutic INR - Initial INR >10= ( lab  error) repeat without reversal agents= 1.32 in HD; no heparin with HD; admit team is managing am INR= 1.62/; In the past With my discussion with him at op Dialysis center He was not always compliant with his med's= metoprolol is on his outpt med list 6. Rib fractures post fall - pain control 7. Secondary hyperparathyroidism - Ca corrected~10.5 phos 5.7 phoslo 1 each meal/ use 2.0 ca bath continue hectorol   / check records for last pth trend. 8. Anemia - Hb 13.7>11.9>12.5 and Holding ESA's for now 9. Poor social situation -  On admit report of law enforcement who extricated patient from his house stated at point of being condemned; thus he cannot return to that living situation; will need case Production designer, theatre/television/film and social work involvement   Lenny Pastel, PA-C [facility] Beeper [phone] 02/01/2013,11:13 AM  LOS: 2 days   I have seen and examined this patient and agree with plan as outlined above. Remains on ATBS's for LE cellulitis. Mentally some better but prob sig dementia underlying. For HD tomorrow.  Fistulagram tomorrow.  Coumadin.  OK to use heparin with HD. Will require ALF or other placement Cannot go back to previous living situtation.Camille Bal B,MD 02/01/2013 12:07 PM

## 2013-02-01 NOTE — Progress Notes (Signed)
Pt's son Jhonnie Aliano) wants to be updated with results from MRI. Results not available at this time. Will endorse to morning shift. Gilman Schmidt

## 2013-02-02 ENCOUNTER — Inpatient Hospital Stay (HOSPITAL_COMMUNITY): Payer: Medicare Other

## 2013-02-02 DIAGNOSIS — L02419 Cutaneous abscess of limb, unspecified: Secondary | ICD-10-CM

## 2013-02-02 DIAGNOSIS — A419 Sepsis, unspecified organism: Secondary | ICD-10-CM

## 2013-02-02 DIAGNOSIS — G9340 Encephalopathy, unspecified: Secondary | ICD-10-CM

## 2013-02-02 LAB — CBC WITH DIFFERENTIAL/PLATELET
Basophils Absolute: 0 10*3/uL (ref 0.0–0.1)
Basophils Relative: 0 % (ref 0–1)
Eosinophils Relative: 0 % (ref 0–5)
Lymphocytes Relative: 7 % — ABNORMAL LOW (ref 12–46)
Lymphs Abs: 0.9 10*3/uL (ref 0.7–4.0)
Monocytes Absolute: 1.4 10*3/uL — ABNORMAL HIGH (ref 0.1–1.0)
Neutrophils Relative %: 82 % — ABNORMAL HIGH (ref 43–77)
Platelets: 305 10*3/uL (ref 150–400)
RBC: 4.21 MIL/uL — ABNORMAL LOW (ref 4.22–5.81)
RDW: 17 % — ABNORMAL HIGH (ref 11.5–15.5)
WBC: 13 10*3/uL — ABNORMAL HIGH (ref 4.0–10.5)

## 2013-02-02 LAB — RENAL FUNCTION PANEL
CO2: 23 mEq/L (ref 19–32)
Calcium: 9.1 mg/dL (ref 8.4–10.5)
Chloride: 94 mEq/L — ABNORMAL LOW (ref 96–112)
Creatinine, Ser: 7.8 mg/dL — ABNORMAL HIGH (ref 0.50–1.35)
GFR calc Af Amer: 7 mL/min — ABNORMAL LOW (ref >90)
GFR calc non Af Amer: 6 mL/min — ABNORMAL LOW (ref >90)
Phosphorus: 6.9 mg/dL — ABNORMAL HIGH (ref 2.3–4.6)
Sodium: 136 mEq/L (ref 135–145)

## 2013-02-02 LAB — GLUCOSE, CAPILLARY
Glucose-Capillary: 114 mg/dL — ABNORMAL HIGH (ref 70–99)
Glucose-Capillary: 59 mg/dL — ABNORMAL LOW (ref 70–99)
Glucose-Capillary: 84 mg/dL (ref 70–99)
Glucose-Capillary: 69 mg/dL — ABNORMAL LOW (ref 70–99)
Glucose-Capillary: 74 mg/dL (ref 70–99)

## 2013-02-02 LAB — BASIC METABOLIC PANEL
CO2: 24 mEq/L (ref 19–32)
Calcium: 9.2 mg/dL (ref 8.4–10.5)
Creatinine, Ser: 7.52 mg/dL — ABNORMAL HIGH (ref 0.50–1.35)
GFR calc Af Amer: 7 mL/min — ABNORMAL LOW (ref >90)
GFR calc non Af Amer: 6 mL/min — ABNORMAL LOW (ref >90)
Sodium: 138 mEq/L (ref 135–145)

## 2013-02-02 LAB — PROTIME-INR
INR: 2.33 — ABNORMAL HIGH (ref 0.00–1.49)
Prothrombin Time: 24.8 seconds — ABNORMAL HIGH (ref 11.6–15.2)

## 2013-02-02 MED ORDER — DOXERCALCIFEROL 4 MCG/2ML IV SOLN
INTRAVENOUS | Status: AC
  Filled 2013-02-02: qty 2

## 2013-02-02 MED ORDER — IOHEXOL 300 MG/ML  SOLN
100.0000 mL | Freq: Once | INTRAMUSCULAR | Status: AC | PRN
  Administered 2013-02-02: 65 mL via INTRAVENOUS

## 2013-02-02 MED ORDER — DEXTROSE 50 % IV SOLN: 25.0000 mL | Freq: Once | INTRAVENOUS | Status: AC | PRN

## 2013-02-02 MED ORDER — WARFARIN SODIUM 5 MG PO TABS
5.0000 mg | ORAL_TABLET | Freq: Once | ORAL | Status: AC
  Administered 2013-02-02: 5 mg via ORAL
  Filled 2013-02-02: qty 1

## 2013-02-02 MED ORDER — ACETAMINOPHEN 325 MG PO TABS
ORAL_TABLET | ORAL | Status: AC
  Filled 2013-02-02: qty 2

## 2013-02-02 MED ORDER — DEXTROSE 50 % IV SOLN
INTRAVENOUS | Status: AC
  Administered 2013-02-02: 25 mL
  Filled 2013-02-02: qty 50

## 2013-02-02 MED ORDER — INSULIN GLARGINE 100 UNIT/ML ~~LOC~~ SOLN
35.0000 [IU] | Freq: Every day | SUBCUTANEOUS | Status: DC
  Filled 2013-02-02: qty 0.35

## 2013-02-02 NOTE — Evaluation (Signed)
Occupational Therapy Evaluation Patient Details Name: Chris Vance MRN: [id number] DOB: 05-16-1940 Today's Date: 02/02/2013 Time: 4098-1191 OT Time Calculation (min): 28 min  OT Assessment / Plan / Recommendation History of present illness Pt was admitted 12/4- after missing dialysis and police finding him down at home confused; bleeding from fall at home.   Clinical Impression   Pt demos decline in function with ADLs and ADL mobility safety with decreased strength, balance, endurance and safety awareness. Pt would benefit from acute OT services to address impairments to increase level of function and safety. Pt demos very poor safety awareness and requires 2 person assist for mobility and extensive assist for ADLs at this time and SNF for rehab is being recommended for safest d/c option. Co session with PT for pt/therapist safety    OT Assessment  Patient needs continued OT Services    Follow Up Recommendations  SNF;Supervision/Assistance - 24 hour    Barriers to Discharge Decreased caregiver support pt lives at home alone and is unsafe; family unable to provide current level of care  Equipment Recommendations  None recommended by OT;Other (comment) (TBD)    Recommendations for Other Services    Frequency  Min 2X/week    Precautions / Restrictions Precautions Precautions: Fall Restrictions Weight Bearing Restrictions: No   Pertinent Vitals/Pain 10/10 pain R rib areas    ADL  Eating/Feeding: Performed;Supervision/safety;Set up Where Assessed - Eating/Feeding: Bed level Grooming: Performed;Wash/dry hands;Wash/dry face;Min guard Where Assessed - Grooming: Supported sitting Upper Body Bathing: Simulated;Min guard Where Assessed - Upper Body Bathing: Supported sitting Lower Body Bathing: Maximal assistance;Simulated Upper Body Dressing: Performed;Min guard Where Assessed - Upper Body Dressing: Unsupported sitting Lower Body Dressing: +1 Total assistance Toilet Transfer:  +2 Total assistance;Simulated Toilet Transfer: Patient Percentage: 50% Toilet Transfer Method: Sit to stand;Stand pivot Toileting - Clothing Manipulation and Hygiene: Performed;Maximal assistance Where Assessed - Engineer, mining and Hygiene: Standing Tub/Shower Transfer Method: Not assessed Transfers/Ambulation Related to ADLs: multimodal cues for initiation, safety, hand placement and sequencing ADL Comments: pt able to stand with 2 person assist and 2 person assist for balance for hygiene after BM in bed    OT Diagnosis: Generalized weakness;Acute pain  OT Problem List: Decreased strength;Decreased knowledge of use of DME or AE;Decreased knowledge of precautions;Decreased activity tolerance;Impaired balance (sitting and/or standing);Decreased safety awareness OT Treatment Interventions: Self-care/ADL training;Therapeutic exercise;Patient/family education;Neuromuscular education;Balance training;Therapeutic activities;DME and/or AE instruction   OT Goals(Current goals can be found in the care plan section) Acute Rehab OT Goals Patient Stated Goal: im going home OT Goal Formulation: With patient Time For Goal Achievement: 02/09/13 Potential to Achieve Goals: Good ADL Goals Pt Will Perform Grooming: with supervision;with set-up;sitting Pt Will Perform Upper Body Bathing: with supervision;with set-up;sitting Pt Will Perform Lower Body Bathing: with mod assist;sitting/lateral leans;sit to/from stand Pt Will Perform Upper Body Dressing: with supervision;with set-up;sitting Pt Will Transfer to Toilet: with total assist;with max assist;bedside commode Pt Will Perform Toileting - Clothing Manipulation and hygiene: with max assist;with mod assist;sitting/lateral leans;sit to/from stand Additional ADL Goal #1: Pt will complete bed mobility with max - mod A to sit EOB in prep for ADLs  Visit Information  Last OT Received On: 02/02/13 Assistance Needed: +2 PT/OT/SLP  Co-Evaluation/Treatment: Yes PT goals addressed during session: Mobility/safety with mobility OT goals addressed during session: ADL's and self-care History of Present Illness: Pt was admitted 12/4- after missing dialysis and police finding him down at home confused; bleeding from fall at home.  Prior Functioning     Home Living Family/patient expects to be discharged to:: Private residence Living Arrangements: Alone Type of Home: Mobile home Home Access: Stairs to enter Secretary/administrator of Steps: 8 Entrance Stairs-Rails: Right;Left Home Layout: One level Home Equipment: None Prior Function Level of Independence: Independent Communication Communication: No difficulties Dominant Hand: Left         Vision/Perception Vision - History Baseline Vision: Wears glasses only for reading Patient Visual Report: No change from baseline Perception Perception: Within Functional Limits   Cognition  Cognition Arousal/Alertness: Awake/alert Behavior During Therapy: Restless;Agitated Overall Cognitive Status: No family/caregiver present to determine baseline cognitive functioning Area of Impairment: Orientation;Attention;Memory;Following commands;Safety/judgement;Awareness Orientation Level: Disoriented to;Place Current Attention Level: Sustained Following Commands: Follows one step commands inconsistently Safety/Judgement: Decreased awareness of safety;Decreased awareness of deficits General Comments: pt confused and resistive, required increased time to initiate mobility and ADL tasks    Extremity/Trunk Assessment Upper Extremity Assessment Upper Extremity Assessment: Overall WFL for tasks assessed;Generalized weakness;RUE deficits/detail;LUE deficits/detail RUE: Unable to fully assess due to pain LUE: Unable to fully assess due to pain Lower Extremity Assessment Lower Extremity Assessment: Difficult to assess due to impaired cognition Cervical / Trunk  Assessment Cervical / Trunk Assessment: Kyphotic     Mobility Bed Mobility Bed Mobility: Supine to Sit;Sitting - Scoot to Edge of Bed Supine to Sit: 1: +2 Total assist Supine to Sit: Patient Percentage: 50% Sitting - Scoot to Edge of Bed: 2: Max assist Transfers Transfers: Sit to Stand;Stand to Sit Sit to Stand: 1: +2 Total assist;From bed Sit to Stand: Patient Percentage: 70% Stand to Sit: To chair/3-in-1;1: +2 Total assist Stand to Sit: Patient Percentage: 70% Details for Transfer Assistance: multimodal cues for initiation, safety, hand placement and sequencing; pt demo decreased safety awareness with transfers; 2+ for safety      Exercise     Balance Balance Balance Assessed: Yes Static Sitting Balance Static Sitting - Balance Support: Bilateral upper extremity supported;Feet supported Static Sitting - Level of Assistance: 5: Stand by assistance Dynamic Sitting Balance Dynamic Sitting - Balance Support: No upper extremity supported;During functional activity;Feet unsupported Dynamic Sitting - Level of Assistance: Other (comment);5: Stand by assistance (min guard A) Static Standing Balance Static Standing - Balance Support: Bilateral upper extremity supported;During functional activity Static Standing - Level of Assistance: 3: Mod assist   End of Session OT - End of Session Equipment Utilized During Treatment: Gait belt Activity Tolerance: Patient tolerated treatment well Patient left: in chair;with chair alarm set;with family/visitor present  GO     Galen Manila 02/02/2013, 3:19 PM

## 2013-02-02 NOTE — Progress Notes (Signed)
ANTICOAGULATION CONSULT NOTE - Follow Up Consult  Pharmacy Consult for coumadin Indication: atrial fibrillation  Allergies  Allergen Reactions  . Ace Inhibitors   . Lisinopril Cough    Patient Measurements: Height: 5\' 10"  (177.8 cm) Weight: 182 lb 1.6 oz (82.6 kg) IBW/kg (Calculated) : 73   Vital Signs: Temp: 98 F (36.7 C) (12/08 1000) Temp src: Oral (12/08 1000) BP: 104/50 mmHg (12/08 1000) Pulse Rate: 92 (12/08 1000)  Labs:  Recent Labs  01/31/13 0503 02/01/13 0815 02/02/13 0605 02/02/13 0855  HGB 11.9* 12.5*  --  13.0  HCT 37.1* 38.5*  --  40.1  PLT 224 246  --  305  LABPROT 16.8* 18.8* 24.8*  --   INR 1.40 1.62* 2.33*  --   CREATININE 4.08* 6.11*  --  7.52*    Estimated Creatinine Clearance: 9.3 ml/min (by C-G formula based on Cr of 7.52).  Assessment: Patient os a 73 y.o M on coumadin for Afib.  INR is therapeutic today at 2.33 (from 1.62).  No bleeding documented.  Goal of Therapy:  INR 2-3 Monitor platelets by anticoagulation protocol: Yes   Plan:  1) coumadin 5 mg PO x1 today 2) consider d/c heparin SQ since INR is therapeutic 3) cont with vancomycin and zosyn  Mckay Tegtmeyer P 02/02/2013,10:35 AM

## 2013-02-02 NOTE — Progress Notes (Signed)
    Day 3 of stay      Patient name: Chris Vance  Medical record number: [id number]  Date of birth: Sep 11, 1940  Summary: 72 y.o. male with severe sepsis and encephalopathy.   Subjective: Patient was in HD when I met with him. He was disoriented to place and time, but knew his name. He jokes a lot, but he does not know what is going on with him.   Objective: Exam today is significant for disorientation times 2; heart exam- RRR, no murmurs; lungs - bibasilar crackles heard from the front, patient was on HD so did not hear back; benign abdominal exam; extremities - bilateral foot wounds.   Plan  Sepsis, cellulitis - The source of sepsis was thought to be leg wound. His WBC count is still high, however, he does not have any fever. MRI without contrast done with no definite answers for osteomyelitis. There appears to be a foreign body embedded in the left foot as appears on XRay. We need ortho to consult on the wounds, question of osteomyelitis, whether to proceed with a bone scan or a CT scan of the feet, and foreign body extraction from the left foot without which there might be little chance of the wound healing. We continue antibiotics for now - Vancomycin and Zocyn.    ESRD on HD with hyperkalemia - We will follow nephrology plan and HD for this.  Diabetes - Continue with current management.   Disposion -  SNF looks like the most likely disposition for the patient right now.    I have seen and evaluated this patient and discussed it with my IM resident team.  Please see the rest of the plan per resident note from today.   Penny Arrambide 02/02/2013, 6:00 PM.

## 2013-02-02 NOTE — Progress Notes (Signed)
Hypoglycemic Event  CBG: 59  Treatment: D50 IV 25 mL  Symptoms: None  Follow-up CBG: Time: 8:10 CBG Result: 114  Possible Reasons for Event: Inadequate meal intake  Comments/MD notified: MD notified    Anant Agard L  Remember to initiate Hypoglycemia Order Set & complete

## 2013-02-02 NOTE — Consult Note (Signed)
ORTHOPAEDIC CONSULTATION  REQUESTING PHYSICIAN: Burns Spain, MD  Chief Complaint: BLE ulcers  HPI: Chris Vance is a 72 y.o. male who complains of  Several weeks of R lat mal ulcer and L 4/5th MT head ulcer. With possible foreign body on xray  Past Medical History  Diagnosis Date  . Renal disorder   . Diabetes mellitus   . Hypertension   . Anemia   . Hyperlipidemia   . Thyroid disease   . Anxiety   . Skin cancer    Past Surgical History  Procedure Laterality Date  . Av fistula placement     History   Social History  . Marital Status: Divorced    Spouse Name: N/A    Number of Children: N/A  . Years of Education: N/A   Social History Main Topics  . Smoking status: Current Every Day Smoker -- 1.00 packs/day for 55 years    Types: Cigarettes    Start date: 02/26/1957  . Smokeless tobacco: Never Used  . Alcohol Use: No  . Drug Use: No  . Sexual Activity: None   Other Topics Concern  . None   Social History Narrative  . None   Family History  Problem Relation Age of Onset  . Asthma Father    Allergies  Allergen Reactions  . Ace Inhibitors   . Lisinopril Cough   Prior to Admission medications   Medication Sig Start Date End Date Taking? Authorizing Provider  calcium acetate, Phos Binder, (PHOSLYRA) 667 MG/5ML SOLN 3 tablets daily with meals   Yes Historical Provider, MD  gabapentin (NEURONTIN) 300 MG capsule Take 300 mg by mouth at bedtime.   Yes Historical Provider, MD  HYDROcodone-acetaminophen (NORCO/VICODIN) 5-325 MG per tablet Take 1 tablet by mouth every 4 (four) hours as needed. 01/29/13  Yes Candyce Churn, MD  insulin glargine (LANTUS) 100 UNIT/ML injection Inject 58 Units into the skin at bedtime.   Yes Historical Provider, MD  labetalol (NORMODYNE) 200 MG tablet Take 100 mg by mouth 2 (two) times daily. Takes 1/2 tablet twice daily.   Yes Historical Provider, MD  lidocaine-prilocaine (EMLA) cream Apply 1 application topically  3 (three) times a week. Dialysis med   Yes Historical Provider, MD  metoprolol (LOPRESSOR) 50 MG tablet Take 50 mg by mouth 2 (two) times daily.   Yes Historical Provider, MD  omeprazole (PRILOSEC) 20 MG capsule Take 20 mg by mouth daily.   Yes Historical Provider, MD  warfarin (COUMADIN) 5 MG tablet Take 5 mg by mouth daily.   Yes Historical Provider, MD   Mr Foot Right Wo Contrast  02/01/2013   CLINICAL DATA:  Right foot pain and swelling.  EXAM: MRI OF THE RIGHT FOREFOOT WITHOUT CONTRAST  TECHNIQUE: Multiplanar, multisequence MR imaging was performed. No intravenous contrast was administered.  COMPARISON:  Radiographs 01/31/2013.  FINDINGS: Examination is quite limited due to patient motion. There is severe cellulitic change with marked subcutaneous soft tissue swelling/ edema and fluid. No discrete drainable abscess is identified. There is diffuse fatty change involving the foot muscles and mild diffuse myositis. Moderate degenerative changes involving the forefoot but no definite MR findings for osteomyelitis or septic arthritis. There is a remote healed 5th metatarsal fracture noted.  IMPRESSION: Diffuse cellulitis and mild myositis but no definite MR findings for osteomyelitis or septic arthritis.   Electronically Signed   By: Loralie Champagne M.D.   On: 02/01/2013 15:34   Mr Foot Left Wo Contrast  02/01/2013   CLINICAL DATA:  Left foot pain.  Possible fracture.  EXAM: MRI OF THE LEFT FOREFOOT WITHOUT CONTRAST  TECHNIQUE: Multiplanar, multisequence MR imaging was performed. No intravenous contrast was administered.  COMPARISON:  Radiographs 01/31/2013.  FINDINGS: Very limited examination due to patient motion. There is diffuse cellulitic change and mild myositis. Diffuse fatty change involving the foot musculature.  There appears to be soft tissue thickening and mild edema around the lateral aspect of the 5th metatarsal phalangeal joint. There is some signal abnormality in the base of the 5th proximal  phalanx. Could not exclude osteomyelitis. No findings to suggest septic arthritis.  No definite acute 4th metatarsal fracture.  IMPRESSION: 1. Limited examination due to motion. 2. Diffuse cellulitis and mild myositis. No discrete soft tissue abscess. 3. Signal abnormality in the 5th proximal phalanx could be artifact but could not exclude osteomyelitis.   Electronically Signed   By: Loralie Champagne M.D.   On: 02/01/2013 15:14   Mr Ankle Right  Wo Contrast  02/01/2013   CLINICAL DATA:  Lateral ankle ulcer.  EXAM: MRI OF THE RIGHT ANKLE WITHOUT CONTRAST  TECHNIQUE: Multiplanar, multisequence MR imaging of the ankle was performed. No intravenous contrast was administered.  COMPARISON:  Radiographs 01/31/2013.  FINDINGS: Diffuse subcutaneous soft tissue swelling/ edema/fluid suggesting cellulitis without definite discrete drainable soft tissue abscess. No MR findings to suggest septic arthritis or osteomyelitis. Diffuse muscle edema suggesting nonspecific myositis.  Degenerative changes are noted at the tibiotalar joint and involving the subtalar joints and midfoot.  IMPRESSION:  Significant cellulitic change and myositis but no definite MR findings for focal soft tissue abscess, septic arthritis or osteomyelitis involving the ankle.   Electronically Signed   By: Loralie Champagne M.D.   On: 02/01/2013 15:06    Positive ROS: All other systems have been reviewed and were otherwise negative with the exception of those mentioned in the HPI and as above.  Labs cbc  Recent Labs  02/01/13 0815 02/02/13 0855  WBC 14.4* 13.0*  HGB 12.5* 13.0  HCT 38.5* 40.1  PLT 246 305    Labs inflam No results found for this basename: ESR, CRP,  in the last 72 hours  Labs coag  Recent Labs  02/01/13 0815 02/02/13 0605  INR 1.62* 2.33*     Recent Labs  02/01/13 0815 02/02/13 0855  NA 136 138  K 4.5 5.0  CL 95* 96  CO2 24 24  GLUCOSE 152* 80  BUN 43* 57*  CREATININE 6.11* 7.52*  CALCIUM 9.0 9.2     Physical Exam: Filed Vitals:   02/02/13 1000  BP: 104/50  Pulse: 92  Temp: 98 F (36.7 C)  Resp: 18   General: Alert, no acute distress Cardiovascular: No pedal edema Respiratory: No cyanosis, no use of accessory musculature GI: No organomegaly, abdomen is soft and non-tender Skin: No lesions in the area of chief complaint Neurologic: Sensation intact distally Psychiatric: Patient is competent for consent with normal mood and affect Lymphatic: No axillary or cervical lymphadenopathy  MUSCULOSKELETAL:  RLE: mod erythema at lower leg. 2.5cm wound over lateral mal with no drainaige and no increased erythema or warmth. Decreased sensation globally, 1+ pulses, +TA/GS/EHL LLE: 1cm dry wound at MT head of lateral foot. No erythema or warmth, decreased sensation, 1+ DP, +TA/GS/EHL Other extremities are atraumatic with painless ROM and NVI.  Assessment: BLE diabetic wounds, cellulitis to RLE  Plan: No indication for urgent debridement Will discuss and possibly refer to Dr. Lajoyce Corners for long  term management.  Weight Bearing Status: WBAT BLE Wounds open to air as long as they remain dry PT Vanc/Zosyn for cellulitis that is improving per patient.  VTE px: SCD's and chemical per primary team.  Dispo: ok for D/c from an orthopedic standpoint, will place note for f/u with either myself or Dr. Lajoyce Corners as soon as this is decided.    Margarita Rana, D, MD Cell [phone]   02/02/2013 12:14 PM

## 2013-02-02 NOTE — Clinical Social Work Note (Signed)
CSW talked extensively with son regarding patient and discharge plans, full assessment to follow. Son in agreement with SNF for ST rehab. CSW also talked with Kirke Shaggy with [facility] [phone]) as a report was filed on patient. CSW provided patient with SNF list for [facility] as per son, patient's brother lives in [place].   Genelle Bal, MSW, LCSW [phone]

## 2013-02-02 NOTE — Procedures (Signed)
Procedure:  Right arm AV fistulogram Findings:  Right Cimino fistula widely patent.  Huge aneurysm just beyond arterial anastamosis with swirling flow, but no significant visualized thrombus.  Venous outflow shows competitive outflow via deep venous system at antecubital fossa. Recomm:  If persistent problems with flow rates during HD, recommend Vascular Surgical consultation for consideration of aneurysm resection/plication.

## 2013-02-02 NOTE — Progress Notes (Signed)
Pt refusing all medications. Combative. Patient only oriented to self. Called Internal medicine. Per MD hold all medications this pm except for reassess to see if patient can tolerate metoprolol in q1 hour. Per MD hold lantus at this time. Call MD if patient refuses. Will continue to monitor. Gilman Schmidt

## 2013-02-02 NOTE — Progress Notes (Signed)
I have personally seen and examined this patient and agree with the assessment/plan as outlined above by [facility] PA. Aamari Strawderman K.,MD 02/02/2013 11:23 AM

## 2013-02-02 NOTE — Evaluation (Addendum)
Physical Therapy Evaluation Patient Details Name: Chris Vance MRN: [id number] DOB: 02/14/1941 Today's Date: 02/02/2013 Time: 7829-5621 PT Time Calculation (min): 25 min  PT Assessment / Plan / Recommendation History of Present Illness  Pt was admitted 12/4- after missing dialysis and police finding him down at home confused; bleeding from fall at home.  Clinical Impression  Pt adm due to the above. Presents with decreased independence with mobility secondary to deficits listed below. Pt to benefit from skilled PT to address deficits below and increase independence with mobility. Pt is a high fall risk. Pt to benefit from post acute rehab at [facility] upon acute D/C.     PT Assessment  Patient needs continued PT services    Follow Up Recommendations  SNF;Supervision/Assistance - 24 hour    Does the patient have the potential to tolerate intense rehabilitation      Barriers to Discharge Decreased caregiver support pt lives alone     Equipment Recommendations  None recommended by PT    Recommendations for Other Services     Frequency Min 3X/week    Precautions / Restrictions Precautions Precautions: Fall Restrictions Weight Bearing Restrictions: No   Pertinent Vitals/Pain Pt c/o 10/10 pain in ribs; RN notified.       Mobility  Bed Mobility Bed Mobility: Supine to Sit;Sitting - Scoot to Edge of Bed Supine to Sit: 1: +2 Total assist Supine to Sit: Patient Percentage: 50% Sitting - Scoot to Edge of Bed: 2: Max assist Transfers Transfers: Sit to Stand;Stand to [employer] Transfers Sit to Stand: 1: +2 Total assist;From bed Sit to Stand: Patient Percentage: 70% Stand to Sit: To chair/3-in-1;1: +2 Total assist Stand to Sit: Patient Percentage: 70% Stand Pivot Transfers: 1: +2 Total assist Stand Pivot Transfers: Patient Percentage: 70% Details for Transfer Assistance: multimodal cues for initiation, safety, hand placement and sequencing; pt demo decreased safety  awareness with transfers; 2+ for safety  Ambulation/Gait Ambulation/Gait Assistance: Not tested (comment)         PT Diagnosis: Abnormality of gait;Generalized weakness;Acute pain  PT Problem List: Decreased mobility;Decreased safety awareness;Decreased activity tolerance;Decreased cognition;Decreased balance PT Treatment Interventions: Gait training;DME instruction;Therapeutic activities;Therapeutic exercise;Patient/family education;Balance training;Neuromuscular re-education;Functional mobility training     PT Goals(Current goals can be found in the care plan section) Acute Rehab PT Goals Patient Stated Goal: im going home PT Goal Formulation: With patient Time For Goal Achievement: 02/16/13 Potential to Achieve Goals: Fair  Visit Information  Assistance Needed: +2 PT/OT/SLP Co-Evaluation/Treatment: Yes PT goals addressed during session: Mobility/safety with mobility OT goals addressed during session: ADL's and self-care History of Present Illness: Pt was admitted 12/4- after missing dialysis and police finding him down at home confused; bleeding from fall at home.       Prior Functioning  Home Living Family/patient expects to be discharged to:: Private residence Living Arrangements: Alone Type of Home: Mobile home Home Access: Stairs to enter Entrance Stairs-Number of Steps: 8 Entrance Stairs-Rails: Right;Left Home Layout: One level Home Equipment: None Prior Function Level of Independence: Independent Communication Communication: No difficulties Dominant Hand: Left    Cognition  Cognition Arousal/Alertness: Awake/alert Behavior During Therapy: Restless;Agitated Overall Cognitive Status: No family/caregiver present to determine baseline cognitive functioning Area of Impairment: Orientation;Attention;Memory;Following commands;Safety/judgement;Awareness Orientation Level: Disoriented to;Place Current Attention Level: Sustained Following Commands: Follows one step  commands inconsistently Safety/Judgement: Decreased awareness of safety;Decreased awareness of deficits General Comments: pt confused and resistive, required increased time to initiate mobility and ADL tasks    Extremity/Trunk Assessment Upper Extremity  Assessment Upper Extremity Assessment: Overall WFL for tasks assessed;Generalized weakness;RUE deficits/detail;LUE deficits/detail RUE: Unable to fully assess due to pain LUE: Unable to fully assess due to pain Lower Extremity Assessment Lower Extremity Assessment: Difficult to assess due to impaired cognition Cervical / Trunk Assessment Cervical / Trunk Assessment: Kyphotic   Balance Balance Balance Assessed: Yes Static Sitting Balance Static Sitting - Balance Support: Bilateral upper extremity supported;Feet supported Static Sitting - Level of Assistance: 5: Stand by assistance Dynamic Sitting Balance Dynamic Sitting - Balance Support: No upper extremity supported;During functional activity;Feet unsupported Dynamic Sitting - Level of Assistance: Other (comment);5: Stand by assistance (min guard A) Static Standing Balance Static Standing - Balance Support: Bilateral upper extremity supported;During functional activity Static Standing - Level of Assistance: 3: Mod assist  End of Session PT - End of Session Equipment Utilized During Treatment: Gait belt Activity Tolerance: Patient tolerated treatment well Patient left: in chair;with call bell/phone within reach;with chair alarm set;with family/visitor present Nurse Communication: Mobility status  GP Functional Limitation: Mobility: Walking and moving around   [place], [place], [place] [phone] 02/02/2013, 3:28 PM

## 2013-02-02 NOTE — Progress Notes (Signed)
Hypoglycemic Event  CBG: 69  Treatment: 15 GM carbohydrate snack  Symptoms: Nervous/irritable  Follow-up CBG: Time:2036 CBG Result:74  Possible Reasons for Event: Inadequate meal intake   Chris Vance  Remember to initiate Hypoglycemia Order Set & complete

## 2013-02-02 NOTE — Progress Notes (Signed)
[facility] Progress Note  Subjective:   Could not identify me by name or what I did (knows me well at baseline). Could not say this was [place], but able to say [facility] was in [place]. The president was Obama and it was 2014  Objective Filed Vitals:   02/01/13 1351 02/01/13 2053 02/02/13 0441 02/02/13 1000  BP: 104/64 120/77 126/74 104/50  Pulse: 98 95 100 92  Temp: 98 F (36.7 C) 98 F (36.7 C) 97.7 F (36.5 C) 98 F (36.7 C)  TempSrc: Oral Oral Oral Oral  Resp: 18 18 18 18   Height:  5\' 10"  (1.778 m)    Weight:  82.6 kg (182 lb 1.6 oz)    SpO2: 98% 94% 96% 97%   Physical Exam General: talkative  Heart: irreg, irreg Lungs: no wheezes/rales, decrease Abdomen: soft NT Extremities: tr LE edema Dialysis Access: left AVF + bruit Neuro:  Intermittently confused  Outpatient dialysis schedule: MWF [facility] 3 3/4 hours 180NR BFR 450 A1.5 EDW 81 kg 3K2.25 Ca dialysate UF profile 4 Machine temp 36 Access right AVF Needle gauge 15 Meds: doxercalciferol 3 mcg TIW, heparin 7800 units  Assessment/Plan: 1. Confusion/AMS - yesterday back to baseline - today rambling about things that don't make sense - you would think he was trying to joke, but he cannot say who I am or what I do. 2. ESRD - MWF - HD today hyperkalemia on admission resolved with HD; for f'gram today in IR  - had decreased AF may have contributed to ^ K 3. Anemia - Hgb > 12 ESA on hold 4. Secondary hyperparathyroidism - 2 Ca bath, hectorol 3 5. HTN/volume - BB and volulme control 6. Nutrition - ^ pro renal diet and multivit/Breeze 7. Bilateral foot cellulits - empiric Vanc and Zosyn; MRIs neg except could not excluded osteo in 5th left phalanx 8. A fib with RVR and supratheraeutic INR on adm was lab error - INR corrected and in goal now 9. Social situation - needs SNF placement; unable to return to home at the point of being condemned  Sheffield Slider, PA-C [facility] [phone] 02/02/2013,10:39 AM  LOS: 3 days    Additional Objective Labs: Lab Results  Component Value Date   INR 2.33* 02/02/2013   INR 1.62* 02/01/2013   INR 1.40 01/31/2013   Basic Metabolic Panel:  Recent Labs Lab 01/30/13 2013 01/31/13 0503 02/01/13 0815 02/02/13 0855  NA 134* 138 136 138  K 6.3* 4.3 4.5 5.0  CL 95* 99 95* 96  CO2 19 25 24 24   GLUCOSE 176* 144* 152* 80  BUN 66* 25* 43* 57*  CREATININE 8.18* 4.08* 6.11* 7.52*  CALCIUM 9.7 8.6 9.0 9.2  PHOS 6.5* 4.3 5.7*  --    Liver Function Tests:  Recent Labs Lab 01/30/13 1541 01/30/13 2013 01/31/13 0503 02/01/13 0815  AST 14  --   --   --   ALT 12  --   --   --   ALKPHOS 141*  --   --   --   BILITOT 1.7*  --   --   --   PROT 7.3  --   --   --   ALBUMIN 3.4* 3.2* 2.7* 2.5*  CBC:  Recent Labs Lab 01/29/13 1255 01/30/13 1541 01/31/13 0503 02/01/13 0815 02/02/13 0855  WBC 8.6 14.9* 13.9* 14.4* 13.0*  NEUTROABS 7.0 13.4* 11.9*  --  10.7*  HGB 11.8* 13.7 11.9* 12.5* 13.0  HCT 37.4* 41.7 37.1* 38.5* 40.1  MCV 97.4 96.1 95.9 96.5 95.2  PLT 234 344 224 246 305  CBG:  Recent Labs Lab 02/01/13 1130 02/01/13 1635 02/01/13 2055 02/02/13 0742 02/02/13 0810  GLUCAP 137* 140* 120* 59* 114*  Studies/Results: Mr Foot Right Wo Contrast  02/01/2013   CLINICAL DATA:  Right foot pain and swelling.  EXAM: MRI OF THE RIGHT FOREFOOT WITHOUT CONTRAST  TECHNIQUE: Multiplanar, multisequence MR imaging was performed. No intravenous contrast was administered.  COMPARISON:  Radiographs 01/31/2013.  FINDINGS: Examination is quite limited due to patient motion. There is severe cellulitic change with marked subcutaneous soft tissue swelling/ edema and fluid. No discrete drainable abscess is identified. There is diffuse fatty change involving the foot muscles and mild diffuse myositis. Moderate degenerative changes involving the forefoot but no definite MR findings for osteomyelitis or septic arthritis. There  is a remote healed 5th metatarsal fracture noted.  IMPRESSION: Diffuse cellulitis and mild myositis but no definite MR findings for osteomyelitis or septic arthritis.   Electronically Signed   By: Loralie Champagne M.D.   On: 02/01/2013 15:34   Mr Foot Left Wo Contrast  02/01/2013   CLINICAL DATA:  Left foot pain.  Possible fracture.  EXAM: MRI OF THE LEFT FOREFOOT WITHOUT CONTRAST  TECHNIQUE: Multiplanar, multisequence MR imaging was performed. No intravenous contrast was administered.  COMPARISON:  Radiographs 01/31/2013.  FINDINGS: Very limited examination due to patient motion. There is diffuse cellulitic change and mild myositis. Diffuse fatty change involving the foot musculature.  There appears to be soft tissue thickening and mild edema around the lateral aspect of the 5th metatarsal phalangeal joint. There is some signal abnormality in the base of the 5th proximal phalanx. Could not exclude osteomyelitis. No findings to suggest septic arthritis.  No definite acute 4th metatarsal fracture.  IMPRESSION: 1. Limited examination due to motion. 2. Diffuse cellulitis and mild myositis. No discrete soft tissue abscess. 3. Signal abnormality in the 5th proximal phalanx could be artifact but could not exclude osteomyelitis.   Electronically Signed   By: Loralie Champagne M.D.   On: 02/01/2013 15:14   Mr Ankle Right  Wo Contrast  02/01/2013   CLINICAL DATA:  Lateral ankle ulcer.  EXAM: MRI OF THE RIGHT ANKLE WITHOUT CONTRAST  TECHNIQUE: Multiplanar, multisequence MR imaging of the ankle was performed. No intravenous contrast was administered.  COMPARISON:  Radiographs 01/31/2013.  FINDINGS: Diffuse subcutaneous soft tissue swelling/ edema/fluid suggesting cellulitis without definite discrete drainable soft tissue abscess. No MR findings to suggest septic arthritis or osteomyelitis. Diffuse muscle edema suggesting nonspecific myositis.  Degenerative changes are noted at the tibiotalar joint and involving the subtalar  joints and midfoot.  IMPRESSION:  Significant cellulitic change and myositis but no definite MR findings for focal soft tissue abscess, septic arthritis or osteomyelitis involving the ankle.   Electronically Signed   By: Loralie Champagne M.D.   On: 02/01/2013 15:06   Medications:   . calcium acetate  1,334 mg Oral TID WC  . doxercalciferol  3 mcg Intravenous Q M,W,F-HD  . feeding supplement (RESOURCE BREEZE)  1 Container Oral TID WC  . heparin subcutaneous  5,000 Units Subcutaneous Q8H  . insulin aspart  0-9 Units Subcutaneous TID WC  . insulin glargine  35 Units Subcutaneous QHS  . metoprolol  50 mg Oral BID  . multivitamin  1 tablet Oral QHS  . pantoprazole  40 mg Oral Daily  . piperacillin-tazobactam (ZOSYN)  IV  2.25 g Intravenous Q8H  .  sodium chloride  3 mL Intravenous Q12H  . sodium chloride  3 mL Intravenous Q12H  . vancomycin  1,000 mg Intravenous Q M,W,F-HD  . Warfarin - Pharmacist Dosing Inpatient   Does not apply [phone]

## 2013-02-02 NOTE — Progress Notes (Signed)
Subjective: Patient is altered this morning. He believes that it is [birth year] or 1943 and he is in a [employer] and it is snowing.   Objective: Vital signs in last 24 hours: Filed Vitals:   02/01/13 1000 02/01/13 1351 02/01/13 2053 02/02/13 0441  BP: 112/72 104/64 120/77 126/74  Pulse: 110 98 95 100  Temp: 98.4 F (36.9 C) 98 F (36.7 C) 98 F (36.7 C) 97.7 F (36.5 C)  TempSrc: Oral Oral Oral Oral  Resp: 18 18 18 18   Height:   5\' 10"  (1.778 m)   Weight:   182 lb 1.6 oz (82.6 kg)   SpO2: 97% 98% 94% 96%   Weight change: 0 lb (0 kg)  Intake/Output Summary (Last 24 hours) at 02/02/13 0714 Last data filed at 02/02/13 0442  Gross per 24 hour  Intake    473 ml  Output      0 ml  Net    473 ml   Vitals reviewed. General: resting in bed, in NAD.  HEENT: no scleral icterus Cardiac: RRR, no rubs, murmurs or gallops. JVD + Pulm: bibasilar crackles anteriorly, scattered wheezes. No respiratory distress.  Abd: soft, nontender, nondistended, BS present Ext: warm and well perfused, trace pitting edema bilaterally. Erythema, increased warmth of the right ankle with wound in right malleolus covered with eschar. Left lateral foot with 5mm full thickeness minimum ss output.  Neuro: alert but not oriented, cranial nerves II-XII grossly intact, moves all extremities voluntarily. Mild asterixis present. Legs: Feet:  Chronic wounds of the left lateral sole of foot with 5 mm full thickness breakdown. No obvious bone exposure but serosanguinous drainainge.    Lab Results: Basic Metabolic Panel:  Recent Labs Lab 01/31/13 0503 02/01/13 0815  NA 138 136  K 4.3 4.5  CL 99 95*  CO2 25 24  GLUCOSE 144* 152*  BUN 25* 43*  CREATININE 4.08* 6.11*  CALCIUM 8.6 9.0  PHOS 4.3 5.7*   Liver Function Tests: CBC:  Recent Labs Lab 01/30/13 1541 01/31/13 0503 02/01/13 0815  WBC 14.9* 13.9* 14.4*  NEUTROABS 13.4* 11.9*  --   HGB 13.7 11.9* 12.5*  HCT 41.7 37.1* 38.5*  MCV 96.1 95.9 96.5  PLT  344 224 246   CBG:  Recent Labs Lab 01/31/13 1607 01/31/13 2057 02/01/13 0734 02/01/13 1130 02/01/13 1635 02/01/13 2055  GLUCAP 138* 177* 124* 137* 140* 120*   Hemoglobin A1C:  Recent Labs Lab 01/31/13 0135  HGBA1C 6.5*   Coagulation:  Recent Labs Lab 01/30/13 2013 01/31/13 0135 01/31/13 0503 02/01/13 0815  LABPROT 16.1* 16.2* 16.8* 18.8*  INR 1.32 1.33 1.40 1.62*    Studies/Results: Dg Ankle Complete Right  01/31/2013   CLINICAL DATA:  Swelling.  Soft tissue ulcer.  EXAM: RIGHT ANKLE - COMPLETE 3+ VIEW  COMPARISON:  None.  FINDINGS: There is moderate arthritis of the ankle joint. No ankle joint effusion. No evidence of bone destruction to suggest osteomyelitis. Extensive arterial vascular calcifications present in the soft tissues.  IMPRESSION: No acute osseous abnormality. Specifically, no evidence of osteomyelitis. Moderate arthritis of the ankle joint.   Electronically Signed   By: Geanie Cooley M.D.   On: 01/31/2013 08:24   Mr Foot Right Wo Contrast  02/01/2013   CLINICAL DATA:  Right foot pain and swelling.  EXAM: MRI OF THE RIGHT FOREFOOT WITHOUT CONTRAST  TECHNIQUE: Multiplanar, multisequence MR imaging was performed. No intravenous contrast was administered.  COMPARISON:  Radiographs 01/31/2013.  FINDINGS: Examination is quite limited due to  patient motion. There is severe cellulitic change with marked subcutaneous soft tissue swelling/ edema and fluid. No discrete drainable abscess is identified. There is diffuse fatty change involving the foot muscles and mild diffuse myositis. Moderate degenerative changes involving the forefoot but no definite MR findings for osteomyelitis or septic arthritis. There is a remote healed 5th metatarsal fracture noted.  IMPRESSION: Diffuse cellulitis and mild myositis but no definite MR findings for osteomyelitis or septic arthritis.   Electronically Signed   By: Loralie Champagne M.D.   On: 02/01/2013 15:34   Mr Foot Left Wo  Contrast  02/01/2013   CLINICAL DATA:  Left foot pain.  Possible fracture.  EXAM: MRI OF THE LEFT FOREFOOT WITHOUT CONTRAST  TECHNIQUE: Multiplanar, multisequence MR imaging was performed. No intravenous contrast was administered.  COMPARISON:  Radiographs 01/31/2013.  FINDINGS: Very limited examination due to patient motion. There is diffuse cellulitic change and mild myositis. Diffuse fatty change involving the foot musculature.  There appears to be soft tissue thickening and mild edema around the lateral aspect of the 5th metatarsal phalangeal joint. There is some signal abnormality in the base of the 5th proximal phalanx. Could not exclude osteomyelitis. No findings to suggest septic arthritis.  No definite acute 4th metatarsal fracture.  IMPRESSION: 1. Limited examination due to motion. 2. Diffuse cellulitis and mild myositis. No discrete soft tissue abscess. 3. Signal abnormality in the 5th proximal phalanx could be artifact but could not exclude osteomyelitis.   Electronically Signed   By: Loralie Champagne M.D.   On: 02/01/2013 15:14   Mr Ankle Right  Wo Contrast  02/01/2013   CLINICAL DATA:  Lateral ankle ulcer.  EXAM: MRI OF THE RIGHT ANKLE WITHOUT CONTRAST  TECHNIQUE: Multiplanar, multisequence MR imaging of the ankle was performed. No intravenous contrast was administered.  COMPARISON:  Radiographs 01/31/2013.  FINDINGS: Diffuse subcutaneous soft tissue swelling/ edema/fluid suggesting cellulitis without definite discrete drainable soft tissue abscess. No MR findings to suggest septic arthritis or osteomyelitis. Diffuse muscle edema suggesting nonspecific myositis.  Degenerative changes are noted at the tibiotalar joint and involving the subtalar joints and midfoot.  IMPRESSION:  Significant cellulitic change and myositis but no definite MR findings for focal soft tissue abscess, septic arthritis or osteomyelitis involving the ankle.   Electronically Signed   By: Loralie Champagne M.D.   On: 02/01/2013  15:06   Dg Foot 2 Views Left  01/31/2013   CLINICAL DATA:  Ulceration along the medial side of the ankle.  EXAM: LEFT FOOT - 2 VIEW  COMPARISON:  None.  FINDINGS: Vascular calcifications noted. Irregularity in the proximal metaphysis of the 4th metatarsal. Linear calcific density projecting plantar to the distal 4th metatarsal may represent a foreign body or dense focal linear calcification in the plantar soft tissues. There is deformity of the shaft of the proximal phalanx 2nd toe, query old fracture.  No malalignment at the Lisfranc joint. Accessory navicular noted. Dorsal midfoot spurring. Plantar and Achilles calcaneal spurs. Poor definition of the subtalar joint and anterior calcaneus on the lateral projection due to obliquity. Probable tibiotalar joint effusion. No definite bony destructive findings characteristic of osteomyelitis.  IMPRESSION: 1. Today's exam focused on the foot rather than the ankle. There is some irregularity in the proximal metaphysis of the 4th metatarsal which could represent a fracture ; oblique view might help to further assess this. 2. 6 mm linear calcific density in the plantar soft tissues below the distal 4th metatarsal, suspicious for foreign body. 3. Suspected old  fracture of the proximal phalanx 2nd toe. 4. Plantar and Achilles calcaneal spurs with dorsal spurring at the Lisfranc joint. 5. Vascular calcifications. 6. Difficult to assess the hindfoot on the lateral projection due to obliquity. CT or MRI may be required for comprehensive assessment.   Electronically Signed   By: Herbie Baltimore M.D.   On: 01/31/2013 08:24   Medications: I have reviewed the patient's current medications. Scheduled Meds: . calcium acetate  1,334 mg Oral TID WC  . doxercalciferol  3 mcg Intravenous Q M,W,F-HD  . feeding supplement (RESOURCE BREEZE)  1 Container Oral TID WC  . heparin subcutaneous  5,000 Units Subcutaneous Q8H  . insulin aspart  0-9 Units Subcutaneous TID WC  . insulin  glargine  40 Units Subcutaneous QHS  . metoprolol  50 mg Oral BID  . multivitamin  1 tablet Oral QHS  . pantoprazole  40 mg Oral Daily  . piperacillin-tazobactam (ZOSYN)  IV  2.25 g Intravenous Q8H  . sodium chloride  3 mL Intravenous Q12H  . sodium chloride  3 mL Intravenous Q12H  . vancomycin  1,000 mg Intravenous Q M,W,F-HD  . Warfarin - Pharmacist Dosing Inpatient   Does not apply q1800   Continuous Infusions:  PRN Meds:.sodium chloride, acetaminophen, acetaminophen, HYDROcodone-acetaminophen, sodium chloride Assessment/Plan: Mr. Marlette is a 72 year old man with PMH of A.fib (on coumadin, BB), ESRD on HD, DM2, presenting with encephalopathy and sepsis due to bilateral foot ulcers.   Sepsis 2/2 bil foot wounds and right ankle cellulitis (Wound Bil Feet)  The patient has bilateral feet wound concerning for cellulitis and possible deep infection including OM. Given tachycardia, AMS and leukocytosis at presentation, this represented severe sepsis that has now resolved. CXR clear aside from 4 rib fractures with no effusion, pulm edema, or consolidation concerning for PNA. Patient's neck is supple. No lateralizing neuro findings, thus meningitis is less likely. XR of Left foot and Right ankle did not rule in/out OM. He continues to improve clinically, MRI bl feet demonstrated possible OM of left 5th digit, myositis of right foot and left foot. No evidence of seeptic joint at this time.  - Continue Vanc Zosyn IV per pharm  - I/O cath for UA and urine culture, yet to be collected but pt has not suprapubic tenderness.  - Consulted Ortho for eval and treatment (Dr. Ranell Patrick)  ESRD c/b Hyperkalemia - (resolved). He had missed 2 hemodialysis sessions the week prior to his presentation with K of 6.9 that has neem now stabilized after emergent HD.  - HD per nephrology, plan for another session on Monday with fistulagram.   Acute Encephalopathy (resolved)  Etiology of patients acute encephalopathy is  likely multifactorial. Likely causes include UTI, sepsis, OM of feet, Uremia, and drug side effect. Patient's neck is supple. No lateralizing neuro findings, thus meningitis is less likely. Mental status has much improved thought he may have cognitive impairment at baseline. Mini Cog with normal clock face, 2/3 five minute recall.  - See sepsis plan  - Will start pain medication slowly with Vicodin given multiple rib fractures and assess for mental status changes.    SVT (Afib with RVR) (resolved)  Patients SVT is of unknown etiology (likely afib with RVR) at this time. Patient reports not taking his rate control medications in the two days before admission. Likely he has a history of SVT with RVR that is rate controlled with an atypical regimen in metoprolol 50 mg BID and labetalol 100 mg bid.  -  Will hold labetalol given possible sepsis.  - Metoprolol 50 mg bid for rate control   Rib fractures  Multiple rib fractures. Patient is breathing comfortably. Will hold narcotics given AMS and administer tylenol prn. No signs of paradoxical chest wall movement at this time. Thus, no concern for flail chest. Of note, binders are contraindicated as they may increase risk of respiratory complications. His pain is not well controlled with Tylenol alone and he is at risk of acute delirium 2/2 pain.  -Start small dose of Vicodin -Incentive spirometry  Supra therapeutic INR - He is on coumadin Therapy for Afib  Patients INR was >10, but recheck showed INR 1.32. Thus the initial was likely a lab error. Remains subtherapeutic. -Warfarin per pharmacy  DM II  Home lantus dose is 58 U qhs. Decreased to 40 U while inpatient. SSI sensitive. CBG qhc and hs. Hg A1C of 6.5% Low blood sugar this AM in 50's. Likely due to NPO status. Plan to decrease Lantus to 35 U tonight.  SW  Patient was brought from home that is filled with trash, free excrement, dirty diapers and rodents. Son feels that house needs to be  condemned. Patient will likely need SNF placement for rehab.  - Consult SW  DVT PPx: Hep sq TID  Dispo: Disposition is deferred at this time, awaiting improvement of current medical problems.   The patient does have a current PCP Aida Puffer, MD) and does not need an [facility] hospital follow-up appointment after discharge.  The patient does have transportation limitations that hinder transportation to clinic appointments.  .Services Needed at time of discharge: Y = Yes, Blank = No PT: Pending eval  OT: Pending eval  RN:   Equipment: Pending eval  Other:     LOS: 3 days   Pleas Koch, MD 02/02/2013, 7:14 AM

## 2013-02-03 LAB — CBC
Hemoglobin: 14 g/dL (ref 13.0–17.0)
MCH: 31.1 pg (ref 26.0–34.0)
MCV: 96.4 fL (ref 78.0–100.0)
Platelets: 322 10*3/uL (ref 150–400)
RBC: 4.5 MIL/uL (ref 4.22–5.81)
WBC: 12.9 10*3/uL — ABNORMAL HIGH (ref 4.0–10.5)

## 2013-02-03 LAB — GLUCOSE, CAPILLARY
Glucose-Capillary: 130 mg/dL — ABNORMAL HIGH (ref 70–99)
Glucose-Capillary: 102 mg/dL — ABNORMAL HIGH (ref 70–99)
Glucose-Capillary: 112 mg/dL — ABNORMAL HIGH (ref 70–99)

## 2013-02-03 LAB — BASIC METABOLIC PANEL
Calcium: 9.2 mg/dL (ref 8.4–10.5)
Chloride: 94 mEq/L — ABNORMAL LOW (ref 96–112)
Creatinine, Ser: 5.19 mg/dL — ABNORMAL HIGH (ref 0.50–1.35)
GFR calc Af Amer: 12 mL/min — ABNORMAL LOW (ref >90)

## 2013-02-03 LAB — TROPONIN I
Troponin I: <0.3 ng/mL (ref <0.30)
Troponin I: <0.3 ng/mL (ref <0.30)

## 2013-02-03 LAB — MRSA PCR SCREENING: MRSA by PCR: NEGATIVE

## 2013-02-03 LAB — PROTIME-INR: INR: 2.94 — ABNORMAL HIGH (ref 0.00–1.49)

## 2013-02-03 MED ORDER — COLLAGENASE 250 UNIT/GM EX OINT
TOPICAL_OINTMENT | Freq: Every day | CUTANEOUS | Status: DC
  Administered 2013-02-03 – 2013-02-12 (×9): via TOPICAL
  Filled 2013-02-03 (×3): qty 30

## 2013-02-03 MED ORDER — WARFARIN SODIUM 2.5 MG PO TABS
2.5000 mg | ORAL_TABLET | Freq: Once | ORAL | Status: AC
  Administered 2013-02-03: 2.5 mg via ORAL
  Filled 2013-02-03: qty 1

## 2013-02-03 NOTE — Progress Notes (Addendum)
Pt remains agitated and confused. Pt refused PO metoprolol. Per MD hold and await possible IV order if needed. Will continue to monitor.  1215-MD called and asked to speak with patient. Pt refused taking medications with MD on phone. Pt stated, "Those girls are overdosing me on medications." Per MD, hold medications. MD to be called if heart rate increases above 140. Will contineut to monitor. Mardene Celeste I

## 2013-02-03 NOTE — Progress Notes (Signed)
    Day 4 of stay      Patient name: Chris Vance  Medical record number: [id number]  Date of birth: [birth year]  Summary: 72 y.o.man with sepsis secondary to foot wounds, disorientation. Other issues: ESRD on HD, Afib with an episode of RVR today.  Subjective: Has some chest wall pain right side - where he had the rib fractures.   Objective: Exam today is significant for disorientation to place.    Plan  Sepsis, right foot cellulitis - We have consulted with ortho for possibility of osteomyelitis who think that there is no need for immediate intervention. We will continue antibiotics and place a consult to Dr. Lajoyce Corners for long term wound care. We will continue Vanc and Zocyn for now.   Afib RVR -  On metoprolol 50 BID. We will add a small dose of cardizem if this is not controlled. On Coumadin. We will send troponins times 1.   Altered Mental Status - CT head is negative. Meningeal signs are absent. This looks most likely due to infection and uremia. It is also possible that he already had underlying dementia which got aggravated due to the acute fall and the following events.   I have seen and evaluated this patient and discussed it with my IM resident team.  Please see the rest of the plan per resident note from today.   Elena Davia 02/03/2013, 3:45 PM.

## 2013-02-03 NOTE — Progress Notes (Signed)
I have personally seen and examined this patient and agree with the assessment/plan as outlined above by [facility]. Denorris Reust K.,MD 02/03/2013 11:04 AM

## 2013-02-03 NOTE — Progress Notes (Signed)
Subjective: Patient mental status is mildly improved. He is oriented to person and year, but not date or location. He remembers me and member of the team. He refused his night meds due to concern of being overdosed. His HR was 140's this am. After explaining that the meds were to control his HR the patient agreed to take the pills without problem. Continues to have mild right sided chest wall pain.  Objective: Vital signs in last 24 hours: Filed Vitals:   02/02/13 1830 02/02/13 1853 02/02/13 2054 02/03/13 0527  BP: 115/91 159/77 145/71 147/72  Pulse: 91 95 100 102  Temp:  98.2 F (36.8 C) 98.1 F (36.7 C) 98.3 F (36.8 C)  TempSrc:  Oral Oral Oral  Resp:  16 16 16   Height:      Weight:  180 lb 12.4 oz (82 kg) 180 lb 12.4 oz (81.999 kg)   SpO2:  98% 99% 95%   Weight change: 3 lb 8.4 oz (1.6 kg)  Intake/Output Summary (Last 24 hours) at 02/03/13 0831 Last data filed at 02/02/13 2055  Gross per 24 hour  Intake    360 ml  Output   1800 ml  Net  -1440 ml   Vitals reviewed. General: resting in bed, in NAD.  HEENT: no scleral icterus Cardiac: RRR, no rubs, murmurs or gallops. JVD + Pulm: bibasilar crackles anteriorly, scattered wheezes. No respiratory distress.  Abd: soft, nontender, nondistended, BS present Ext: warm and well perfused, trace pitting edema bilaterally. Erythema, increased warmth of the right ankle with wound in right malleolus covered with eschar. Left lateral foot with 5mm full thickeness minimum ss output.  Neuro: alert and oriented to person and year but not location, cranial nerves II-XII grossly intact, moves all extremities voluntarily. Mild asterixis present. Legs: Feet:  Chronic wounds of the left lateral sole of foot with 5 mm full thickness breakdown. No obvious bone exposure but serosanguinous drainainge.    Lab Results: Basic Metabolic Panel:  Recent Labs Lab 02/01/13 0815 02/02/13 0855 02/02/13 1430  NA 136 138 136  K 4.5 5.0 4.9  CL 95*  96 94*  CO2 24 24 23   GLUCOSE 152* 80 105*  BUN 43* 57* 59*  CREATININE 6.11* 7.52* 7.80*  CALCIUM 9.0 9.2 9.1  PHOS 5.7*  --  6.9*   Liver Function Tests: CBC:  Recent Labs Lab 01/31/13 0503 02/01/13 0815 02/02/13 0855  WBC 13.9* 14.4* 13.0*  NEUTROABS 11.9*  --  10.7*  HGB 11.9* 12.5* 13.0  HCT 37.1* 38.5* 40.1  MCV 95.9 96.5 95.2  PLT 224 246 305   CBG:  Recent Labs Lab 02/02/13 0742 02/02/13 0810 02/02/13 1257 02/02/13 1954 02/02/13 2034 02/03/13 0738  GLUCAP 59* 114* 84 69* 74 99   Hemoglobin A1C:  Recent Labs Lab 01/31/13 0135  HGBA1C 6.5*   Coagulation:  Recent Labs Lab 01/31/13 0135 01/31/13 0503 02/01/13 0815 02/02/13 0605  LABPROT 16.2* 16.8* 18.8* 24.8*  INR 1.33 1.40 1.62* 2.33*    Studies/Results: Mr Foot Right Wo Contrast  02/01/2013   CLINICAL DATA:  Right foot pain and swelling.  EXAM: MRI OF THE RIGHT FOREFOOT WITHOUT CONTRAST  TECHNIQUE: Multiplanar, multisequence MR imaging was performed. No intravenous contrast was administered.  COMPARISON:  Radiographs 01/31/2013.  FINDINGS: Examination is quite limited due to patient motion. There is severe cellulitic change with marked subcutaneous soft tissue swelling/ edema and fluid. No discrete drainable abscess is identified. There is diffuse fatty change involving the foot muscles and  mild diffuse myositis. Moderate degenerative changes involving the forefoot but no definite MR findings for osteomyelitis or septic arthritis. There is a remote healed 5th metatarsal fracture noted.  IMPRESSION: Diffuse cellulitis and mild myositis but no definite MR findings for osteomyelitis or septic arthritis.   Electronically Signed   By: Loralie Champagne M.D.   On: 02/01/2013 15:34   Mr Foot Left Wo Contrast  02/01/2013   CLINICAL DATA:  Left foot pain.  Possible fracture.  EXAM: MRI OF THE LEFT FOREFOOT WITHOUT CONTRAST  TECHNIQUE: Multiplanar, multisequence MR imaging was performed. No intravenous contrast  was administered.  COMPARISON:  Radiographs 01/31/2013.  FINDINGS: Very limited examination due to patient motion. There is diffuse cellulitic change and mild myositis. Diffuse fatty change involving the foot musculature.  There appears to be soft tissue thickening and mild edema around the lateral aspect of the 5th metatarsal phalangeal joint. There is some signal abnormality in the base of the 5th proximal phalanx. Could not exclude osteomyelitis. No findings to suggest septic arthritis.  No definite acute 4th metatarsal fracture.  IMPRESSION: 1. Limited examination due to motion. 2. Diffuse cellulitis and mild myositis. No discrete soft tissue abscess. 3. Signal abnormality in the 5th proximal phalanx could be artifact but could not exclude osteomyelitis.   Electronically Signed   By: Loralie Champagne M.D.   On: 02/01/2013 15:14   Mr Ankle Right  Wo Contrast  02/01/2013   CLINICAL DATA:  Lateral ankle ulcer.  EXAM: MRI OF THE RIGHT ANKLE WITHOUT CONTRAST  TECHNIQUE: Multiplanar, multisequence MR imaging of the ankle was performed. No intravenous contrast was administered.  COMPARISON:  Radiographs 01/31/2013.  FINDINGS: Diffuse subcutaneous soft tissue swelling/ edema/fluid suggesting cellulitis without definite discrete drainable soft tissue abscess. No MR findings to suggest septic arthritis or osteomyelitis. Diffuse muscle edema suggesting nonspecific myositis.  Degenerative changes are noted at the tibiotalar joint and involving the subtalar joints and midfoot.  IMPRESSION:  Significant cellulitic change and myositis but no definite MR findings for focal soft tissue abscess, septic arthritis or osteomyelitis involving the ankle.   Electronically Signed   By: Loralie Champagne M.D.   On: 02/01/2013 15:06   Medications: I have reviewed the patient's current medications. Scheduled Meds: . calcium acetate  1,334 mg Oral TID WC  . doxercalciferol  3 mcg Intravenous Q M,W,F-HD  . feeding supplement (RESOURCE  BREEZE)  1 Container Oral TID WC  . heparin subcutaneous  5,000 Units Subcutaneous Q8H  . insulin aspart  0-9 Units Subcutaneous TID WC  . metoprolol  50 mg Oral BID  . multivitamin  1 tablet Oral QHS  . pantoprazole  40 mg Oral Daily  . piperacillin-tazobactam (ZOSYN)  IV  2.25 g Intravenous Q8H  . sodium chloride  3 mL Intravenous Q12H  . sodium chloride  3 mL Intravenous Q12H  . vancomycin  1,000 mg Intravenous Q M,W,F-HD  . Warfarin - Pharmacist Dosing Inpatient   Does not apply q1800   Continuous Infusions:  PRN Meds:.sodium chloride, acetaminophen, acetaminophen, sodium chloride Assessment/Plan: Mr. Putman is a 72 year old man with PMH of A.fib (on coumadin, BB), ESRD on HD, DM2, presenting with encephalopathy and sepsis due to bilateral foot ulcers.   Sepsis 2/2 bil foot wounds and right ankle cellulitis (Wound Bil Feet)  The patient has bilateral feet wound concerning for cellulitis and possible deep infection including OM. Given tachycardia, AMS and leukocytosis at presentation, this represented severe sepsis that has now resolved. CXR clear aside from  4 rib fractures with no effusion, pulm edema, or consolidation concerning for PNA. Patient's neck is supple. No lateralizing neuro findings, thus meningitis is less likely. XR of Left foot and Right ankle did not rule in/out OM. He continues to improve clinically, MRI bl feet demonstrated possible OM of left 5th digit, myositis of right foot and left foot. No evidence of septic joint at this time. Diffuse cellulitis is present.  - Continue Vanc Zosyn IV per pharm  - Consulted Ortho for eval and treatment (Dr. Ranell Patrick) who rec continuation of ABX and discahrge with outpatient f/u with Dr. Lajoyce Corners. No surgical debridement recommended at this time.  ESRD c/b Hyperkalemia - (resolved). He had missed 2 hemodialysis sessions the week prior to his presentation with K of 6.9 that has neem now stabilized after emergent HD.  - HD per nephrology,  plan for another session on Monday with fistulagram.   Acute Encephalopathy  Etiology of patients acute encephalopathy is likely multifactorial. Likely causes include UTI, sepsis, bil foot infection, Uremia, and drug side effect. Patient's neck is supple. No lateralizing neuro findings, thus meningitis is less likely. Mental status has much improved thought he may have cognitive impairment at baseline. Mini Cog with normal clock face, 2/3 five minute recall on day two of admission. However, the patient continues to have intermittent episodes of confusion and agitation.  - See sepsis plan  - Patient appears to not tolerate opiates after he becomes altered with even low dose Vicodin.  Plan to use tylenol prn.  SVT (Afib with RVR)  Patients SVT is of unknown etiology (likely afib with RVR) at this time. Patient reports not taking his rate control medications in the two days before admission. Likely he has a history of SVT with RVR that is rate controlled with an atypical regimen in metoprolol 50 mg BID and labetalol 100 mg bid. On 02/02/13 the patient refused his rate control meds. Overnight he became tachycardic to 140's. In the AM, he was agreeable to taking his HR meds. - Will hold labetalol given possible sepsis.  - Metoprolol 50 mg bid for rate control   Rib fractures  Multiple rib fractures. Patient is breathing comfortably. Will hold narcotics given AMS and administer tylenol prn. No signs of paradoxical chest wall movement at this time. Thus, no concern for flail chest. Of note, binders are contraindicated as they may increase risk of respiratory complications. His pain is not well controlled with Tylenol alone and he is at risk of acute delirium 2/2 pain.  -Start small dose of Vicodin -Incentive spirometry  Supra therapeutic INR - He is on coumadin Therapy for Afib  Patients INR was >10, but recheck showed INR 1.32. Thus the initial was likely a lab error. Remains subtherapeutic. -Warfarin  per pharmacy  DM II  Home lantus dose is 58 U qhs. Decreased to 40 U while inpatient. SSI sensitive. CBG qhc and hs. Hg A1C of 6.5% Low blood sugar this AM in 50's. Likely due to NPO status. Plan to decrease Lantus to 35 U tonight.  SW  Patient was brought from home that is filled with trash, excrement, dirty diapers and rodents. Son feels that house needs to be condemned. Patient will likely need SNF placement for rehab.  - Consult SW  DVT PPx: Hep sq TID  Dispo: Disposition is deferred at this time, awaiting improvement of current medical problems.   The patient does have a current PCP Aida Puffer, MD) and does not need an [facility] hospital  follow-up appointment after discharge.  The patient does have transportation limitations that hinder transportation to clinic appointments.  .Services Needed at time of discharge: Y = Yes, Blank = No PT: Pending eval  OT: Pending eval  RN:   Equipment: Pending eval  Other:     LOS: 4 days   Pleas Koch, MD 02/03/2013, 8:31 AM

## 2013-02-03 NOTE — Progress Notes (Signed)
[facility] Progress Note  Subjective:   Tells me ortho MD thinks foot will heal without surgery. Cannot recall my name. Able to identify me as a PA from the kidney center. At [facility].  Objective Filed Vitals:   02/02/13 1830 02/02/13 1853 02/02/13 2054 02/03/13 0527  BP: 115/91 159/77 145/71 147/72  Pulse: 91 95 100 102  Temp:  98.2 F (36.8 C) 98.1 F (36.7 C) 98.3 F (36.8 C)  TempSrc:  Oral Oral Oral  Resp:  16 16 16   Height:      Weight:  82 kg (180 lb 12.4 oz) 81.999 kg (180 lb 12.4 oz)   SpO2:  98% 99% 95%   Physical Exam General: alert Heart: irreg irreg Lungs: bilateral wheezes Abdomen: soft NT Neuro: better oriented still talking about stuff that doesn't make sense Extremities:  Tr to + LE edema or right with erythema and right lateral malleous ulcer Dialysis Access: left AVF + bruit   Outpatient dialysis schedule: MWF [facility] 3 3/4 hours 180NR BFR 450 A1.5 EDW 81 kg 3K2.25 Ca dialysate UF profile 4 Machine temp 36 Access right AVF Needle gauge 15 Meds: doxercalciferol 3 mcg TIW, heparin 7800 units   Assessment/Plan:  1. Confusion/AMS - nearing baseline - still rambling about things that don't make sense - suspicious about some of his caregivers today 2. ESRD - MWF -  hyperkalemia on admission resolved with HD;  f'gram in IR Dr. Fredia Sorrow Monday - awaiting full report; previously on a 3 K bath as an outpt may have been contributory 3. Anemia - Hgb > 12 ESA on hold  4. Secondary hyperparathyroidism - 2 Ca bath, hectorol 3; P ^; if it remains up will adjust binders 5. HTN/volume - BB and volulme control UF 1.8 yesterday - has refused some meds 6. Nutrition - ^ pro renal diet and multivit/Breeze  7. Bilateral foot cellulits - empiric Vanc and Zosyn; MRIs neg except could not excluded osteo in 5th left phalanx - seen by ortho - treat conservatively 8. A fib with RVR and supratheraeutic INR on adm was lab error - INR corrected and  in goal now  9. Social situation - needs SNF placement; unable to return to home at the point of being condemned; would consider evaluation for competency  Sheffield Slider, PA-C [facility] Beeper [phone] 02/03/2013,9:21 AM  LOS: 4 days    Additional Objective Labs: Lab Results  Component Value Date   INR 2.33* 02/02/2013   INR 1.62* 02/01/2013   INR 1.40 01/31/2013    Basic Metabolic Panel:  Recent Labs Lab 01/31/13 0503 02/01/13 0815 02/02/13 0855 02/02/13 1430  NA 138 136 138 136  K 4.3 4.5 5.0 4.9  CL 99 95* 96 94*  CO2 25 24 24 23   GLUCOSE 144* 152* 80 105*  BUN 25* 43* 57* 59*  CREATININE 4.08* 6.11* 7.52* 7.80*  CALCIUM 8.6 9.0 9.2 9.1  PHOS 4.3 5.7*  --  6.9*   Liver Function Tests:  Recent Labs Lab 01/30/13 1541  01/31/13 0503 02/01/13 0815 02/02/13 1430  AST 14  --   --   --   --   ALT 12  --   --   --   --   ALKPHOS 141*  --   --   --   --   BILITOT 1.7*  --   --   --   --   PROT 7.3  --   --   --   --  ALBUMIN 3.4*  < > 2.7* 2.5* 2.5*  < > = values in this interval not displayed. No results found for this basename: LIPASE, AMYLASE,  in the last 168 hours CBC:  Recent Labs Lab 01/29/13 1255 01/30/13 1541 01/31/13 0503 02/01/13 0815 02/02/13 0855  WBC 8.6 14.9* 13.9* 14.4* 13.0*  NEUTROABS 7.0 13.4* 11.9*  --  10.7*  HGB 11.8* 13.7 11.9* 12.5* 13.0  HCT 37.4* 41.7 37.1* 38.5* 40.1  MCV 97.4 96.1 95.9 96.5 95.2  PLT 234 344 224 246 305  CBG:  Recent Labs Lab 02/02/13 0810 02/02/13 1257 02/02/13 1954 02/02/13 2034 02/03/13 0738  GLUCAP 114* 84 69* 74 99   Medications:   . calcium acetate  1,334 mg Oral TID WC  . doxercalciferol  3 mcg Intravenous Q M,W,F-HD  . feeding supplement (RESOURCE BREEZE)  1 Container Oral TID WC  . heparin subcutaneous  5,000 Units Subcutaneous Q8H  . insulin aspart  0-9 Units Subcutaneous TID WC  . metoprolol  50 mg Oral BID  . multivitamin  1 tablet Oral QHS  . pantoprazole  40 mg  Oral Daily  . piperacillin-tazobactam (ZOSYN)  IV  2.25 g Intravenous Q8H  . sodium chloride  3 mL Intravenous Q12H  . sodium chloride  3 mL Intravenous Q12H  . vancomycin  1,000 mg Intravenous Q M,W,F-HD  . Warfarin - Pharmacist Dosing Inpatient   Does not apply [phone]

## 2013-02-03 NOTE — Consult Note (Signed)
Reason for Consult: Ischemic ulcer lateral malleolus right ankle Referring Physician: Dr Chris Vance is an 72 y.o. male.  HPI: Patient is a 72 year old gentleman with diabetes end-stage renal disease dialysis who has had a chronic ulcer approximately 3 weeks lateral malleolus right ankle. Patient presents with pain and cellulitis of the foot and ankle.  Past Medical History  Diagnosis Date  . Renal disorder   . Diabetes mellitus   . Hypertension   . Anemia   . Hyperlipidemia   . Thyroid disease   . Anxiety   . Skin cancer     Past Surgical History  Procedure Laterality Date  . Av fistula placement      Family History  Problem Relation Age of Onset  . Asthma Father     Social History:  reports that he has been smoking Cigarettes.  He started smoking about 55 years ago. He has a 55 pack-year smoking history. He has never used smokeless tobacco. He reports that he does not drink alcohol or use illicit drugs.  Allergies:  Allergies  Allergen Reactions  . Ace Inhibitors   . Lisinopril Cough    Medications: I have reviewed the patient's current medications.  Results for orders placed during the hospital encounter of 01/30/13 (from the past 48 hour(s))  GLUCOSE, CAPILLARY     Status: Abnormal   Collection Time    02/01/13  8:55 PM      Result Value Range   Glucose-Capillary 120 (*) 70 - 99 mg/dL  PROTIME-INR     Status: Abnormal   Collection Time    02/02/13  6:05 AM      Result Value Range   Prothrombin Time 24.8 (*) 11.6 - 15.2 seconds   INR 2.33 (*) 0.00 - 1.49  GLUCOSE, CAPILLARY     Status: Abnormal   Collection Time    02/02/13  7:42 AM      Result Value Range   Glucose-Capillary 59 (*) 70 - 99 mg/dL  GLUCOSE, CAPILLARY     Status: Abnormal   Collection Time    02/02/13  8:10 AM      Result Value Range   Glucose-Capillary 114 (*) 70 - 99 mg/dL  BASIC METABOLIC PANEL     Status: Abnormal   Collection Time    02/02/13  8:55 AM      Result  Value Range   Sodium 138  135 - 145 mEq/L   Potassium 5.0  3.5 - 5.1 mEq/L   Chloride 96  96 - 112 mEq/L   CO2 24  19 - 32 mEq/L   Glucose, Bld 80  70 - 99 mg/dL   BUN 57 (*) 6 - 23 mg/dL   Creatinine, Ser 7.82 (*) 0.50 - 1.35 mg/dL   Calcium 9.2  8.4 - 95.6 mg/dL   GFR calc non Af Amer 6 (*) >90 mL/min   GFR calc Af Amer 7 (*) >90 mL/min   Comment: (NOTE)     The eGFR has been calculated using the CKD EPI equation.     This calculation has not been validated in all clinical situations.     eGFR's persistently <90 mL/min signify possible Chronic Kidney     Disease.  CBC WITH DIFFERENTIAL     Status: Abnormal   Collection Time    02/02/13  8:55 AM      Result Value Range   WBC 13.0 (*) 4.0 - 10.5 K/uL   RBC 4.21 (*) 4.22 - 5.81  MIL/uL   Hemoglobin 13.0  13.0 - 17.0 g/dL   HCT 16.1  09.6 - 04.5 %   MCV 95.2  78.0 - 100.0 fL   MCH 30.9  26.0 - 34.0 pg   MCHC 32.4  30.0 - 36.0 g/dL   RDW 40.9 (*) 81.1 - 91.4 %   Platelets 305  150 - 400 K/uL   Neutrophils Relative % 82 (*) 43 - 77 %   Neutro Abs 10.7 (*) 1.7 - 7.7 K/uL   Lymphocytes Relative 7 (*) 12 - 46 %   Lymphs Abs 0.9  0.7 - 4.0 K/uL   Monocytes Relative 11  3 - 12 %   Monocytes Absolute 1.4 (*) 0.1 - 1.0 K/uL   Eosinophils Relative 0  0 - 5 %   Eosinophils Absolute 0.0  0.0 - 0.7 K/uL   Basophils Relative 0  0 - 1 %   Basophils Absolute 0.0  0.0 - 0.1 K/uL  GLUCOSE, CAPILLARY     Status: None   Collection Time    02/02/13 12:57 PM      Result Value Range   Glucose-Capillary 84  70 - 99 mg/dL  RENAL FUNCTION PANEL     Status: Abnormal   Collection Time    02/02/13  2:30 PM      Result Value Range   Sodium 136  135 - 145 mEq/L   Potassium 4.9  3.5 - 5.1 mEq/L   Chloride 94 (*) 96 - 112 mEq/L   CO2 23  19 - 32 mEq/L   Glucose, Bld 105 (*) 70 - 99 mg/dL   BUN 59 (*) 6 - 23 mg/dL   Creatinine, Ser 7.82 (*) 0.50 - 1.35 mg/dL   Calcium 9.1  8.4 - 95.6 mg/dL   Phosphorus 6.9 (*) 2.3 - 4.6 mg/dL   Albumin 2.5 (*)  3.5 - 5.2 g/dL   GFR calc non Af Amer 6 (*) >90 mL/min   GFR calc Af Amer 7 (*) >90 mL/min   Comment: (NOTE)     The eGFR has been calculated using the CKD EPI equation.     This calculation has not been validated in all clinical situations.     eGFR's persistently <90 mL/min signify possible Chronic Kidney     Disease.  GLUCOSE, CAPILLARY     Status: Abnormal   Collection Time    02/02/13  7:54 PM      Result Value Range   Glucose-Capillary 69 (*) 70 - 99 mg/dL  GLUCOSE, CAPILLARY     Status: None   Collection Time    02/02/13  8:34 PM      Result Value Range   Glucose-Capillary 74  70 - 99 mg/dL  GLUCOSE, CAPILLARY     Status: None   Collection Time    02/03/13  7:38 AM      Result Value Range   Glucose-Capillary 99  70 - 99 mg/dL  PROTIME-INR     Status: Abnormal   Collection Time    02/03/13 11:05 AM      Result Value Range   Prothrombin Time 29.6 (*) 11.6 - 15.2 seconds   INR 2.94 (*) 0.00 - 1.49  BASIC METABOLIC PANEL     Status: Abnormal   Collection Time    02/03/13 11:05 AM      Result Value Range   Sodium 137  135 - 145 mEq/L   Potassium 4.6  3.5 - 5.1 mEq/L   Chloride 94 (*) 96 -  112 mEq/L   CO2 25  19 - 32 mEq/L   Glucose, Bld 141 (*) 70 - 99 mg/dL   BUN 30 (*) 6 - 23 mg/dL   Comment: DELTA CHECK NOTED   Creatinine, Ser 5.19 (*) 0.50 - 1.35 mg/dL   Comment: DELTA CHECK NOTED   Calcium 9.2  8.4 - 10.5 mg/dL   GFR calc non Af Amer 10 (*) >90 mL/min   GFR calc Af Amer 12 (*) >90 mL/min   Comment: (NOTE)     The eGFR has been calculated using the CKD EPI equation.     This calculation has not been validated in all clinical situations.     eGFR's persistently <90 mL/min signify possible Chronic Kidney     Disease.  CBC     Status: Abnormal   Collection Time    02/03/13 11:08 AM      Result Value Range   WBC 12.9 (*) 4.0 - 10.5 K/uL   RBC 4.50  4.22 - 5.81 MIL/uL   Hemoglobin 14.0  13.0 - 17.0 g/dL   HCT 40.9  81.1 - 91.4 %   MCV 96.4  78.0 - 100.0 fL    MCH 31.1  26.0 - 34.0 pg   MCHC 32.3  30.0 - 36.0 g/dL   RDW 78.2 (*) 95.6 - 21.3 %   Platelets 322  150 - 400 K/uL  GLUCOSE, CAPILLARY     Status: Abnormal   Collection Time    02/03/13 11:57 AM      Result Value Range   Glucose-Capillary 130 (*) 70 - 99 mg/dL  GLUCOSE, CAPILLARY     Status: Abnormal   Collection Time    02/03/13  5:13 PM      Result Value Range   Glucose-Capillary 102 (*) 70 - 99 mg/dL   Comment 1 Documented in Chart     Comment 2 Notify RN      Ir Shuntogram/ Fistulagram Right Mod Sed  02/03/2013   CLINICAL DATA:  Reduced access flow via right forearm dialysis AV fistula.  EXAM: DIALYSIS AV FISTULOGRAM  CONTRAST:  65mL OMNIPAQUE IOHEXOL 300 MG/ML  SOLN  COMPARISON:  None.  FLUOROSCOPY TIME:  36 seconds.  ACCESS: If there remains decrease in flow rates, vascular surgical consultation is recommended for potential aneurysm resection/plication.  PROCEDURE: The procedure, risks, benefits, and alternatives were explained to the patient. Questions regarding the procedure were encouraged and answered. The patient understands and consents to the procedure.  The right arm native fistula was prepped with Betadine in a sterile fashion, and a sterile drape was applied covering the operative field. A diagnostic fistulogram was performed via an 18 gauge angiocatheter introduced into venous outflow. Venous drainage was assessed to the level of the central veins in the chest. Proximal fistula was studied by reflux maneuver with temporary compression of venous outflow. After the procedure, the angiocatheter was removed and hemostasis obtained with manual compression.  COMPLICATIONS: None  FINDINGS: A patent right arm Cimino fistula is present. 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 The rest of the cephalic vein shows normal patency. At the antecubital fossa, there is equal competing venous outflow a in the  cephalic vein and the deep venous system. Central veins are normally patent including the SVC.  IMPRESSION: Patent right arm Cimino fistula. 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 This may be contributing to reduced  overall access flow in the fistula. Competing venous outflow was noted in the cephalic vein and deep venous system at the level of the upper arm.   Electronically Signed   By: Irish Lack M.D.   On: 02/03/2013 17:13    Review of Systems  All other systems reviewed and are negative.   Blood pressure 113/62, pulse 110, temperature 97.4 F (36.3 C), temperature source Oral, resp. rate 18, height 5\' 10"  (1.778 m), weight 81.999 kg (180 lb 12.4 oz), SpO2 91.00%. Physical Exam On examination patient has a faint palpable dorsalis pedis pulse on the right. There is a palpable thrill with monophasic flow. Patient has cellulitis involving the foot and ankle. There is an ischemic gangrenous ulcer of the lateral malleolus the right ankle this is dry without drainage. MRI scan of the foot and ankle shows no evidence of osteomyelitis. MRI scan the left foot also shows no osteomyelitis. The ulcer is 2 cm in diameter and about 3 mm deep. Assessment/Plan: Assessment: Dry gangrenous ulcer lateral malleolus right ankle with diabetic insensate neuropathy end-stage renal disease on dialysis with cellulitis of the foot and ankle.  Plan: Agreed to continue the IV antibiotics. I will order  ankle-brachial indices and patient may need vascular surgery consult to see if he is a revascularization candidate. Will start Santyl ointment dressing changes. Patient may require surgical debridement in the operating room with skin grafting and a wound VAC. I will followup after the ankle-brachial indices are obtained.  Raychelle Hudman V 02/03/2013, 5:48 PM

## 2013-02-03 NOTE — Progress Notes (Signed)
ANTICOAGULATION  And ANTIBIOTICCONSULT NOTE - Follow Up Consult  Pharmacy Consult for coumadin; vancomycin and zosyn Indication: atrial fibrillation; bilateral foot wounds and osteomyelitis of left 5th digit  Allergies  Allergen Reactions  . Ace Inhibitors   . Lisinopril Cough    Patient Measurements: Height: 5\' 10"  (177.8 cm) Weight: 180 lb 12.4 oz (81.999 kg) IBW/kg (Calculated) : 73   Vital Signs: Temp: 98.3 F (36.8 C) (12/09 0527) Temp src: Oral (12/09 0527) BP: 147/72 mmHg (12/09 0527) Pulse Rate: 102 (12/09 0527)  Labs:  Recent Labs  02/01/13 0815 02/02/13 0605 02/02/13 0855 02/02/13 1430  HGB 12.5*  --  13.0  --   HCT 38.5*  --  40.1  --   PLT 246  --  305  --   LABPROT 18.8* 24.8*  --   --   INR 1.62* 2.33*  --   --   CREATININE 6.11*  --  7.52* 7.80*    Estimated Creatinine Clearance: 9 ml/min (by C-G formula based on Cr of 7.8).  Assessment: Patient is a 72 y.o M on coumadin for Afib.  INR is therapeutic but is trending up to the upper end of therapeutic range.  No bleeding documented.  He's also on vancomycin and zosyn day #4 for bilateral foot wounds and osteomyelitis of the left 5th digit.  No cultures.  Patient remains afebrile with wbc trending down.  Goal of Therapy:  INR 2-3; pre-HD vancomycin level 15-20 Monitor platelets by anticoagulation protocol: Yes   Plan:  1) decrease coumadin dose to 2.5mg  PO x1 today 2) will d/c SQ heparin since INR is >2 3) no change for vancomycin and zosyn  4) please specify plan for abx.  If to continue with vancomycin , pharmacy will plan on checking vancomycin level at the end of the week.  [name] P 02/03/2013,8:31 AM

## 2013-02-04 DIAGNOSIS — I96 Gangrene, not elsewhere classified: Secondary | ICD-10-CM

## 2013-02-04 DIAGNOSIS — R0989 Other specified symptoms and signs involving the circulatory and respiratory systems: Secondary | ICD-10-CM

## 2013-02-04 DIAGNOSIS — I739 Peripheral vascular disease, unspecified: Secondary | ICD-10-CM

## 2013-02-04 DIAGNOSIS — L98499 Non-pressure chronic ulcer of skin of other sites with unspecified severity: Secondary | ICD-10-CM

## 2013-02-04 LAB — RENAL FUNCTION PANEL
Albumin: 2.5 g/dL — ABNORMAL LOW (ref 3.5–5.2)
BUN: 46 mg/dL — ABNORMAL HIGH (ref 6–23)
CO2: 24 mEq/L (ref 19–32)
Calcium: 9.3 mg/dL (ref 8.4–10.5)
Chloride: 96 mEq/L (ref 96–112)
Creatinine, Ser: 6.85 mg/dL — ABNORMAL HIGH (ref 0.50–1.35)
GFR calc Af Amer: 8 mL/min — ABNORMAL LOW (ref >90)
GFR calc non Af Amer: 7 mL/min — ABNORMAL LOW (ref >90)
Glucose, Bld: 156 mg/dL — ABNORMAL HIGH (ref 70–99)
Phosphorus: 6.2 mg/dL — ABNORMAL HIGH (ref 2.3–4.6)
Potassium: 4.5 mEq/L (ref 3.5–5.1)
Sodium: 136 mEq/L (ref 135–145)

## 2013-02-04 LAB — TROPONIN I: Troponin I: <0.3 ng/mL (ref <0.30)

## 2013-02-04 LAB — CBC
HCT: 40.4 % (ref 39.0–52.0)
Hemoglobin: 13.1 g/dL (ref 13.0–17.0)
MCH: 30.9 pg (ref 26.0–34.0)
MCHC: 32.4 g/dL (ref 30.0–36.0)
MCV: 95.3 fL (ref 78.0–100.0)
Platelets: 380 10*3/uL (ref 150–400)
RBC: 4.24 MIL/uL (ref 4.22–5.81)
RDW: 17.1 % — ABNORMAL HIGH (ref 11.5–15.5)
WBC: 13.3 10*3/uL — ABNORMAL HIGH (ref 4.0–10.5)

## 2013-02-04 LAB — GLUCOSE, CAPILLARY
Glucose-Capillary: 131 mg/dL — ABNORMAL HIGH (ref 70–99)
Glucose-Capillary: 171 mg/dL — ABNORMAL HIGH (ref 70–99)

## 2013-02-04 MED ORDER — METOPROLOL TARTRATE 1 MG/ML IV SOLN
5.0000 mg | Freq: Once | INTRAVENOUS | Status: AC
  Administered 2013-02-04: 5 mg via INTRAVENOUS

## 2013-02-04 MED ORDER — METOPROLOL TARTRATE 1 MG/ML IV SOLN
INTRAVENOUS | Status: AC
  Administered 2013-02-04: 5 mg via INTRAVENOUS
  Filled 2013-02-04: qty 5

## 2013-02-04 MED ORDER — HEPARIN SODIUM (PORCINE) 1000 UNIT/ML DIALYSIS: 20.0000 [IU]/kg | INTRAMUSCULAR | Status: DC | PRN

## 2013-02-04 MED ORDER — SODIUM CHLORIDE 0.9 % IV SOLN: 100.0000 mL | INTRAVENOUS | Status: DC | PRN

## 2013-02-04 MED ORDER — METOPROLOL TARTRATE 1 MG/ML IV SOLN
INTRAVENOUS | Status: AC
  Filled 2013-02-04: qty 5

## 2013-02-04 MED ORDER — LIDOCAINE-PRILOCAINE 2.5-2.5 % EX CREA
1.0000 "application " | TOPICAL_CREAM | CUTANEOUS | Status: DC | PRN
  Filled 2013-02-04: qty 5

## 2013-02-04 MED ORDER — LIDOCAINE HCL (PF) 1 % IJ SOLN: 5.0000 mL | INTRAMUSCULAR | Status: DC | PRN

## 2013-02-04 MED ORDER — NEPRO/CARBSTEADY PO LIQD
237.0000 mL | ORAL | Status: DC | PRN
  Filled 2013-02-04: qty 237

## 2013-02-04 MED ORDER — METOPROLOL TARTRATE 50 MG PO TABS
75.0000 mg | ORAL_TABLET | Freq: Two times a day (BID) | ORAL | Status: DC
  Administered 2013-02-04 – 2013-02-05 (×2): 75 mg via ORAL
  Filled 2013-02-04 (×3): qty 1

## 2013-02-04 MED ORDER — ALTEPLASE 2 MG IJ SOLR
2.0000 mg | Freq: Once | INTRAMUSCULAR | Status: DC | PRN
  Filled 2013-02-04: qty 2

## 2013-02-04 MED ORDER — DOXERCALCIFEROL 4 MCG/2ML IV SOLN
INTRAVENOUS | Status: AC
  Filled 2013-02-04: qty 2

## 2013-02-04 MED ORDER — PENTAFLUOROPROP-TETRAFLUOROETH EX AERO: 1.0000 "application " | INHALATION_SPRAY | CUTANEOUS | Status: DC | PRN

## 2013-02-04 MED ORDER — HEPARIN SODIUM (PORCINE) 1000 UNIT/ML DIALYSIS: 1000.0000 [IU] | INTRAMUSCULAR | Status: DC | PRN

## 2013-02-04 NOTE — Procedures (Signed)
Patient seen on Hemodialysis. QB 400, UF goal: keeping even Irregular tachycardia with hypertension at dialysis- will re-assess for b-blocker need Treatment adjusted as needed.  Zetta Bills MD [facility]. Office # [phone] Pager # [phone] 2:33 PM

## 2013-02-04 NOTE — Progress Notes (Signed)
Pt had a 6beat run of SVT while at rest. No distress noted. MD paged twice no response. Will f/u.

## 2013-02-04 NOTE — Clinical Social Work Psychosocial (Addendum)
Clinical Social Work Department BRIEF PSYCHOSOCIAL ASSESSMENT 02/04/2013  Patient:  Chris Vance, Chris Vance     Account Number:  [id number]     Admit date:  01/30/2013  Clinical Social Worker:  Delmer Islam  Date/Time:  02/03/2013 06:24 AM  Referred by:  Physician  Date Referred:  02/03/2013 Referred for  SNF Placement   Other Referral:   Interview type:  Family Other interview type:    PSYCHOSOCIAL DATA Living Status:  ALONE Admitted from facility:   Level of care:   Primary support name:  Chris Vance Primary support relationship to patient:  CHILD, ADULT Degree of support available:   Son very concerned about patient, his health and living situation.    CURRENT CONCERNS Current Concerns  Post-Acute Placement   Other Concerns:   Disposition after short-term rehab.    SOCIAL WORK ASSESSMENT / PLAN On 02/02/13 CSW talked with son regarding short-term rehab. Son shared extensive history with CSW regarding patient and his living situation. Per Chris Vance, patient is a Chart[employer] loss adjuster and CSW was shown pictures of the home. Son reported that his dad has always been [place], and he lived in the home with his mother and father, however after they did, things got out of control. Son reported that he would go to the home to see patient or take him out to eat, however patient would not allow him in the home. He was not able to get into the home until after patient admitted to the hospital. Son is very concerned about what will happen after patient is ready to leave rehab as he cannot go home as the house is unlivable and has been condemned.    CSW talked with son about ST rehab and explained the process and he wants a facility in [facility] as patient's brother, Chris Vance lives in [place]. Son stated that he lives in [place].    Later same date CSW talked with [facility] APS social worker Chris Vance [phone]). CSW informed that police went to patient's home after  being call ed by RN who visits patient's home. Police then called the ambulance and [facility]. CSW provided some background to APS worker and advised her that son would call her.   Assessment/plan status:  Psychosocial Support/Ongoing Assessment of Needs Other assessment/ plan:   Information/referral to community resources:   List of SNF's for [employer]    PATIENT'S/FAMILY'S RESPONSE TO PLAN OF CARE: Son very receptive to talking with CSW and is very concerned about his father, his health and what will happen after patient discharges from rehab. CSW actively listened, provided support and answered son's questions and addressed his concerns regarding his dad's hoarding and living situation.

## 2013-02-04 NOTE — Accreditation Note (Addendum)
Hemodialysis- See flowsheet HR tachy in HD. 130s-140s,non sustained. M.Bergman PA notified. Order to decrease goal to keep even and place on 4K bath until lab results. Dr. Allena Katz given lab results, k=4.5. Order to remain on 4K bath and give 5mg  IV lopressor if HR continues to remain elevated. Lopressor given, HR now 108

## 2013-02-04 NOTE — Progress Notes (Signed)
Physical Therapy Treatment Patient Details Name: Chris Vance MRN: [id number] DOB: [birth year] Today's Date: 02/04/2013 Time: 4098-1191 PT Time Calculation (min): 25 min  PT Assessment / Plan / Recommendation  History of Present Illness Pt was admitted 12/4- after missing dialysis and police finding him down at home confused; bleeding from fall at home.   PT Comments   Pt limited by decreased cognitive status, delayed initiation, impaired attention and awareness.  Able to move with min A but requires increased time for all activities.  Pt unwilling to attempt ambulation, became agitated when PT tried to encourage gait training.  Able to stand with min-mod A for balance activities.  Follow Up Recommendations  SNF     Does the patient have the potential to tolerate intense rehabilitation     Barriers to Discharge        Equipment Recommendations  None recommended by PT    Recommendations for Other Services    Frequency Min 3X/week   Progress towards PT Goals Progress towards PT goals: Progressing toward goals  Plan Current plan remains appropriate    Precautions / Restrictions Precautions Precautions: Fall   Pertinent Vitals/Pain Pt c/o pain in R ribs with light touch, eases with rest    Mobility  Bed Mobility Supine to Sit: 4: Min assist Sitting - Scoot to Edge of Bed: 4: Min assist Details for Bed Mobility Assistance: increased time and cuing for initiation and sequencing Transfers Sit to Stand: 4: Min assist;From bed Stand to Sit: 4: Min assist;To bed Details for Transfer Assistance: cues for initiation and safe hand placement    Exercises     PT Diagnosis:    PT Problem List:   PT Treatment Interventions:     PT Goals (current goals can now be found in the care plan section)    Visit Information  Last PT Received On: 02/04/13 Assistance Needed: +1 (will benefit from +2 for safety with gait) History of Present Illness: Pt was admitted 12/4- after missing  dialysis and police finding him down at home confused; bleeding from fall at home.    Subjective Data      Cognition  Cognition Arousal/Alertness: Awake/alert Behavior During Therapy: Agitated Overall Cognitive Status: No family/caregiver present to determine baseline cognitive functioning Area of Impairment: Orientation;Attention;Memory;Safety/judgement;Awareness Orientation Level: Disoriented to;Place;Situation Current Attention Level: Sustained Safety/Judgement: Decreased awareness of safety;Decreased awareness of deficits Awareness: Intellectual General Comments: pt confused and resistive, required increased time to initiate mobility and ADL tasks    Balance  Static Sitting Balance Static Sitting - Level of Assistance: 6: Modified independent (Device/Increase time) Dynamic Sitting Balance Dynamic Sitting - Balance Support: During functional activity Dynamic Sitting - Level of Assistance: 5: Stand by assistance Static Standing Balance Static Standing - Balance Support: Right upper extremity supported Static Standing - Level of Assistance: 4: Min assist Static Standing - Comment/# of Minutes: static standing x 3 minutes with cues for posture.  pt able to wt shift with min A Dynamic Standing Balance Dynamic Standing - Balance Support: Bilateral upper extremity supported Dynamic Standing - Level of Assistance: 3: Mod assist Dynamic Standing - Comments: standing marching in place wiht mod A, cues for initiation, pt fatigues after 5 marches, able to recover with standing rest and perform more marching x 5.  pt unwilling to attempt ambulation  End of Session PT - End of Session Equipment Utilized During Treatment: Gait belt Activity Tolerance: Patient tolerated treatment well Patient left: in bed;with bed alarm set;with call bell/phone within reach  Nurse Communication: Mobility status   GP     Samiel Peel 02/04/2013, 10:06 AM

## 2013-02-04 NOTE — Consult Note (Signed)
Vascular Surgery Consultation  Reason for Consult: Nonhealing ulcer right lateral ankle  HPI: Chris Vance is a 72 y.o. male who presents for evaluation of nonhealing ulcer right lateral ankle. Patient has diabetes mellitus and renal failure and has at least a 6 week history of an ulceration over the right lateral malleolus. He was seen by Dr. Aldean Baker who requested vascular evaluation.   Past Medical History  Diagnosis Date  . Renal disorder   . Diabetes mellitus   . Hypertension   . Anemia   . Hyperlipidemia   . Thyroid disease   . Anxiety   . Skin cancer    Past Surgical History  Procedure Laterality Date  . Av fistula placement     History   Social History  . Marital Status: Divorced    Spouse Name: N/A    Number of Children: N/A  . Years of Education: N/A   Social History Main Topics  . Smoking status: Current Every Day Smoker -- 1.00 packs/day for 55 years    Types: Cigarettes    Start date: 02/26/1957  . Smokeless tobacco: Never Used  . Alcohol Use: No  . Drug Use: No  . Sexual Activity: None   Other Topics Concern  . None   Social History Narrative  . None   Family History  Problem Relation Age of Onset  . Asthma Father    Allergies  Allergen Reactions  . Ace Inhibitors   . Lisinopril Cough   Prior to Admission medications   Medication Sig Start Date End Date Taking? Authorizing Provider  calcium acetate, Phos Binder, (PHOSLYRA) 667 MG/5ML SOLN 3 tablets daily with meals   Yes Historical Provider, MD  gabapentin (NEURONTIN) 300 MG capsule Take 300 mg by mouth at bedtime.   Yes Historical Provider, MD  HYDROcodone-acetaminophen (NORCO/VICODIN) 5-325 MG per tablet Take 1 tablet by mouth every 4 (four) hours as needed. 01/29/13  Yes Candyce Churn, MD  insulin glargine (LANTUS) 100 UNIT/ML injection Inject 58 Units into the skin at bedtime.   Yes Historical Provider, MD  labetalol (NORMODYNE) 200 MG tablet Take 100 mg by mouth 2 (two)  times daily. Takes 1/2 tablet twice daily.   Yes Historical Provider, MD  lidocaine-prilocaine (EMLA) cream Apply 1 application topically 3 (three) times a week. Dialysis med   Yes Historical Provider, MD  metoprolol (LOPRESSOR) 50 MG tablet Take 50 mg by mouth 2 (two) times daily.   Yes Historical Provider, MD  omeprazole (PRILOSEC) 20 MG capsule Take 20 mg by mouth daily.   Yes Historical Provider, MD  warfarin (COUMADIN) 5 MG tablet Take 5 mg by mouth daily.   Yes Historical Provider, MD     Positive ROS: Denies chest pain.  All other systems have been reviewed and were otherwise negative with the exception of those mentioned in the HPI and as above.  Physical Exam: Filed Vitals:   02/04/13 0900  BP: 140/86  Pulse: 112  Temp: 98 F (36.7 C)  Resp: 20    General: Alert, no acute distress HEENT: Normal for age Cardiovascular: Regular rate and rhythm. Carotid pulses 2+, no bruits audible Respiratory: Clear to auscultation. No cyanosis, no use of accessory musculature GI: No organomegaly, abdomen is soft and non-tender Skin: No lesions in the area of chief complaint Neurologic: Sensation intact distally Psychiatric: Patient is competent for consent with normal mood and affect Musculoskeletal: No obvious deformities Extremities: Right leg with 2-3+ femoral pulse and absent popliteal and distal  pulses. There is a 1-1/2-2 cm ulceration over the right lateral malleolus which is full thickness. No bone exposed . Some mild erythema surrounding this with 1+ edema. Left lower extremity has 2+ femoral pulse but no popliteal or distal pulses palpable.       Assessment/Plan:  Nonhealing ulcer right lateral malleolus do to superficial femoral and tibial occlusive disease in patient with diabetes mellitus and renal failure Very unlikely that this will heal without revascularization and significant question as to whether it would heal with revascularization if feasible Patient is on  chronic Coumadin for atrial fibrillation with INR of 2.3 Have ordered ABIs and duplex scan of right leg to get some idea of anatomy. If angiography is to be done will be to discontinue Coumadin and probably perform angiogram early next week.   Josephina Gip, MD 02/04/2013 10:40 AM  Duplex scan of right leg reveals mid superficial femoral occlusion. Difficult to look distally because of patient movement We'll discontinue Coumadin for now-INR today 3.16 and plan angiogram first of next week Because of location of ulceration and extensive disease fashion at high risk of requiring right leg amputation

## 2013-02-04 NOTE — Progress Notes (Signed)
Subjective:  [name]. Patient remained alert with waxing and waning disorientation and confusion. Remembers me this morning.   Objective: Vital signs in last 24 hours: Filed Vitals:   02/03/13 1358 02/03/13 1850 02/03/13 2029 02/04/13 0500  BP: 113/62 138/73 138/91 135/80  Pulse: 110 119 112 83  Temp: 97.4 F (36.3 C) 98 F (36.7 C) 97.6 F (36.4 C) 97.5 F (36.4 C)  TempSrc: Oral Oral Oral Oral  Resp: 18 22 20 20   Height:      Weight:   175 lb 4.3 oz (79.5 kg)   SpO2: 91% 94% 94% 97%   Weight change: -10 lb 5.8 oz (-4.7 kg)  Intake/Output Summary (Last 24 hours) at 02/04/13 0717 Last data filed at 02/03/13 2248  Gross per 24 hour  Intake    228 ml  Output      1 ml  Net    227 ml   Vitals reviewed. General: resting in bed, in NAD.  HEENT: no scleral icterus Cardiac: RRR, no rubs, murmurs or gallops. JVD + Pulm: bibasilar crackles anteriorly, scattered wheezes. No respiratory distress.  Abd: soft, nontender, nondistended, BS present Ext: warm and well perfused, trace pitting edema bilaterally. Erythema, increased warmth of the right ankle with wound in right malleolus covered with eschar. Left lateral foot with 5mm full thickeness minimum ss output.  Neuro: alert and oriented to person and year but not location, cranial nerves II-XII grossly intact, moves all extremities voluntarily. Mild asterixis present. Legs: Feet:Chronic wounds of the left lateral sole of foot with 5 mm full thickness breakdown. No obvious bone exposure but serosanguinous drainainge.    Lab Results: Basic Metabolic Panel:  Recent Labs Lab 02/01/13 0815  02/02/13 1430 02/03/13 1105  NA 136  < > 136 137  K 4.5  < > 4.9 4.6  CL 95*  < > 94* 94*  CO2 24  < > 23 25  GLUCOSE 152*  < > 105* 141*  BUN 43*  < > 59* 30*  CREATININE 6.11*  < > 7.80* 5.19*  CALCIUM 9.0  < > 9.1 9.2  PHOS 5.7*  --  6.9*  --   < > = values in this interval not displayed. Liver Function Tests: CBC:  Recent  Labs Lab 01/31/13 0503  02/02/13 0855 02/03/13 1108  WBC 13.9*  < > 13.0* 12.9*  NEUTROABS 11.9*  --  10.7*  --   HGB 11.9*  < > 13.0 14.0  HCT 37.1*  < > 40.1 43.4  MCV 95.9  < > 95.2 96.4  PLT 224  < > 305 322  < > = values in this interval not displayed. CBG:  Recent Labs Lab 02/02/13 1954 02/02/13 2034 02/03/13 0738 02/03/13 1157 02/03/13 1713 02/03/13 2026  GLUCAP 69* 74 99 130* 102* 112*   Hemoglobin A1C:  Recent Labs Lab 01/31/13 0135  HGBA1C 6.5*   Coagulation:  Recent Labs Lab 02/01/13 0815 02/02/13 0605 02/03/13 1105 02/04/13 0525  LABPROT 18.8* 24.8* 29.6* 31.3*  INR 1.62* 2.33* 2.94* 3.16*    Studies/Results: Ir Shuntogram/ Fistulagram Right Mod Sed  02/03/2013   CLINICAL DATA:  Reduced access flow via right forearm dialysis AV fistula.  EXAM: DIALYSIS AV FISTULOGRAM  CONTRAST:  65mL OMNIPAQUE IOHEXOL 300 MG/ML  SOLN  COMPARISON:  None.  FLUOROSCOPY TIME:  36 seconds.  ACCESS: If there remains decrease in flow rates, vascular surgical consultation is recommended for potential aneurysm resection/plication.  PROCEDURE: The procedure, risks, benefits, and alternatives  were explained to the patient. Questions regarding the procedure were encouraged and answered. The patient understands and consents to the procedure.  The right arm native fistula was prepped with Betadine in a sterile fashion, and a sterile drape was applied covering the operative field. A diagnostic fistulogram was performed via an 18 gauge angiocatheter introduced into venous outflow. Venous drainage was assessed to the level of the central veins in the chest. Proximal fistula was studied by reflux maneuver with temporary compression of venous outflow. After the procedure, the angiocatheter was removed and hemostasis obtained with manual compression.  COMPLICATIONS: None  FINDINGS: A patent right arm Cimino fistula is present. 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 The rest of the cephalic vein shows normal patency. At the antecubital fossa, there is equal competing venous outflow a in the cephalic vein and the deep venous system. Central veins are normally patent including the SVC.  IMPRESSION: Patent right arm Cimino fistula. 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 This may be contributing to reduced overall access flow in the fistula. Competing venous outflow was noted in the cephalic vein and deep venous system at the level of the upper arm.   Electronically Signed   By: Irish Lack M.D.   On: 02/03/2013 17:13   Medications: I have reviewed the patient's current medications. Scheduled Meds: . calcium acetate  1,334 mg Oral TID WC  . collagenase   Topical Daily  . doxercalciferol  3 mcg Intravenous Q M,W,F-HD  . feeding supplement (RESOURCE BREEZE)  1 Container Oral TID WC  . insulin aspart  0-9 Units Subcutaneous TID WC  . metoprolol  50 mg Oral BID  . multivitamin  1 tablet Oral QHS  . pantoprazole  40 mg Oral Daily  . piperacillin-tazobactam (ZOSYN)  IV  2.25 g Intravenous Q8H  . sodium chloride  3 mL Intravenous Q12H  . sodium chloride  3 mL Intravenous Q12H  . vancomycin  1,000 mg Intravenous Q M,W,F-HD  . Warfarin - Pharmacist Dosing Inpatient   Does not apply q1800   Continuous Infusions:  PRN Meds:.sodium chloride, acetaminophen, acetaminophen, sodium chloride Assessment/Plan: Chris Vance is a 72 year old man with PMH of A.fib (on coumadin, BB), ESRD on HD, DM2, presenting with encephalopathy and sepsis due to bilateral foot ulcers.   Sepsis 2/2 bil foot wounds and right ankle cellulitis (Wound Bil Feet)  The patient has bilateral feet wound concerning for cellulitis and possible deep infection including OM. Given tachycardia, AMS and leukocytosis at presentation, this represented severe sepsis that has now resolved. CXR clear aside from 4 rib  fractures with no effusion, pulm edema, or consolidation concerning for PNA. Patient's neck is supple. No lateralizing neuro findings, thus meningitis is less likely. XR of Left foot and Right ankle did not rule in/out OM. He continues to improve clinically, MRI bl feet demonstrated possible OM of left 5th digit, myositis of right foot and left foot. No evidence of septic joint at this time. Diffuse cellulitis is present.  - Continue Vanc Zosyn IV per pharm  - Consulted Ortho for eval and treatment (Dr. Ranell Patrick) who rec continuation of ABX and discahrge with outpatient f/u with Dr. Lajoyce Corners. Dr. Lajoyce Corners rec Santyl and ABI with possible vasc surgery eval. Pt may require surgical debridement during hospitalization. Dr. Lajoyce Corners will continue to follow.  ESRD c/b Hyperkalemia - (resolved). He had missed 2  hemodialysis sessions the week prior to his presentation with K of 6.9 that has neem now stabilized after emergent HD.  - HD per nephrology, plan for another session on Monday with fistulagram.   Acute Encephalopathy  Etiology of patients acute encephalopathy is likely multifactorial. Likely causes include UTI, sepsis, bil foot infection, Uremia, and drug side effect. Patient's neck is supple. No lateralizing neuro findings, thus meningitis is less likely. Mental status has much improved thought he may have cognitive impairment at baseline. Mini Cog with normal clock face, 2/3 five minute recall on day two of admission. However, the patient continues to have intermittent episodes of confusion and agitation.  - See sepsis plan  - Patient appears to not tolerate opiates after he becomes altered with even low dose Vicodin.  Plan to use tylenol prn.  SVT (Afib with RVR)  Patients SVT is of unknown etiology (likely afib with RVR) at this time. Patient reports not taking his rate control medications in the two days before admission. Likely he has a history of SVT with RVR that is rate controlled with an atypical regimen in  metoprolol 50 mg BID and labetalol 100 mg bid. On 02/02/13 the patient refused his rate control meds. Overnight he became tachycardic to 140's. In the AM, he was agreeable to taking his HR meds. - Will hold labetalol given possible sepsis.  - Metoprolol 50 mg bid for rate control   Rib fractures  Multiple rib fractures. Patient is breathing comfortably. Will hold narcotics given AMS and administer tylenol prn. No signs of paradoxical chest wall movement at this time. Thus, no concern for flail chest. Of note, binders are contraindicated as they may increase risk of respiratory complications. His pain is not well controlled with Tylenol alone and he is at risk of acute delirium 2/2 pain.  -Start small dose of Vicodin -Incentive spirometry  Supra therapeutic INR - He is on coumadin Therapy for Afib  Patients INR was >10, but recheck showed INR 1.32. Thus the initial was likely a lab error. Remains subtherapeutic. -Warfarin per pharmacy  DM II  Home lantus dose is 58 U qhs. Decreased to 40 U while inpatient. SSI sensitive. CBG qhc and hs. Hg A1C of 6.5% Low blood sugar this AM in 50's. Likely due to NPO status. Plan to decrease Lantus to 35 U tonight.  SW  Patient was brought from home that is filled with trash, excrement, dirty diapers and rodents. Son feels that house needs to be condemned. Patient will likely need SNF placement for rehab.  - Consult SW  DVT PPx: Hep sq TID  Dispo: Disposition is deferred at this time, awaiting improvement of current medical problems.   The patient does have a current PCP Aida Puffer, MD) and does not need an [facility] hospital follow-up appointment after discharge.  The patient does have transportation limitations that hinder transportation to clinic appointments.  .Services Needed at time of discharge: Y = Yes, Blank = No PT: Pending eval  OT: Pending eval  RN:   Equipment: Pending eval  Other:     LOS: 5 days   Pleas Koch, MD 02/04/2013,  7:17 AM

## 2013-02-04 NOTE — Progress Notes (Signed)
I have personally seen and examined this patient and agree with the assessment/plan as outlined above by [facility] PA. Obie Kallenbach K.,MD 02/04/2013 2:33 PM

## 2013-02-04 NOTE — Progress Notes (Signed)
    Day 5 of stay      Patient name: Chris Vance  Medical record number: [id number]  Date of birth: 1940/04/06  Summary: 72 y.o. male with altered mental status thought to be secondary to infection (foot wounds - gangrenous ulcer right ankle); Other issues: ESRD on HD, Afib with an episode of RVR today  Subjective: Disoriented. Does not complain of pain.   Objective: Exam today is significant for disorientation, tachycardia, clear lungs, foot wounds as before.   Plan  Sepsis secondary to foot ulcerations - We continue IV antibiotics for now. Ortho would like a vascular surgery consult after ABI before they could decide a plan of care. Appreciate recs.    Altered mental status - Has not shown improvement and seems to be becoming this patient's new baseline. Sepsis, and also perhaps underlying dementia which exacerbated due to the turn in events.   Afib - Metoprolol 50 BID for rate control. Since the patient is constantly tachycardic, we can add small dose of cardizem, initially short acting and convert it to long acting depending on the dose the patient requires.     I have seen and evaluated this patient and discussed it with my IM resident team.  Please see the rest of the plan per resident note from today.   Yoon Barca 02/04/2013, 4:09 PM.

## 2013-02-04 NOTE — Progress Notes (Signed)
Pt is agitated and confused, saying, '' leave me alone''. He refuses his blood sugar to be checked by nurse Tech, also refuses his Zosyn (he doesn't want his IV to be touched). He however took his metoprolol after several tries. Will continue to monitor.

## 2013-02-04 NOTE — Progress Notes (Signed)
Not able to do ABI do to the patient's movements.  Right lower extremity arterial duplex completed.  Triphasic waveforms noted in the bilateral common femoral artery and right profunda artery.  Right mid femoral artery appears occluded.  The waveforms appear biphasic in the proximal and become monophasic in the distal right popliteal artery.  Monophasic waveforms noted in the right posterior tibial artery and biphasic waveforms in the right peroneal artery.  Severe, mixed plaque noted in the right femoral and popliteal arteries.    VASCULAR LAB PRELIMINARY  ARTERIAL  ABI attempted:    RIGHT    LEFT    PRESSURE WAVEFORM  PRESSURE WAVEFORM  BRACHIAL   BRACHIAL    DP   DP    AT  Dampened monophasic AT    PT   PT    PER   PER    GREAT TOE  flat GREAT TOE  flat    RIGHT LEFT  ABI N/A N/A     Aleenah Homen, RVT 02/04/2013, 10:54 AM

## 2013-02-04 NOTE — Progress Notes (Signed)
[facility] Progress Note  Subjective:   Thinks he's having surgery tomorrow - Dr. Arbie Cookey - but doesn't know what kind of surgery.   Objective Filed Vitals:   02/03/13 1850 02/03/13 2029 02/04/13 0500 02/04/13 0900  BP: 138/73 138/91 135/80 140/86  Pulse: 119 112 83 112  Temp: 98 F (36.7 C) 97.6 F (36.4 C) 97.5 F (36.4 C) 98 F (36.7 C)  TempSrc: Oral Oral Oral Oral  Resp: 22 20 20 20   Height:      Weight:  79.5 kg (175 lb 4.3 oz)    SpO2: 94% 94% 97% 96%   Physical Exam General: NAD on room air Heart: irreg Lungs:  Coarse BS  Abdomen: soft + BS Extremities: right foot/LE erythematous with right lateral malleolus ulcer + edema Neuro: oriented to [facility], 14; identifies me as a PA Dialysis Access: left AVF + bruit  patent  Outpatient dialysis schedule: MWF [facility] 3 3/4 hours 180NR BFR 450 A1.5 EDW 81 kg 3K2.25 Ca dialysate UF profile 4 Machine temp 36 Access right AVF Needle gauge 15 Meds: doxercalciferol 3 mcg TIW, heparin 7800 units  Assessment/Plan:  1. Confusion/AMS - nearing baseline - still confused about plans  2. ESRD - MWF HD later today- hyperkalemia on admission resolved with HD; Fgram in IR Dr. Fredia Sorrow Monday showed:Patent right arm Cimino fistula. 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 This may be contributing to reduced overall access flow in the fistula. Competing venous outflow was noted in the cephalic vein and deep venous system at the level of the upper arm."  Also previously on a 3 K bath as an outpt may have been contributory.  Follow AVF conservatively for now. 3. Anemia - Hgb > 12 ESA on hold  4. Secondary hyperparathyroidism - 2 Ca bath, hectorol 3; P ^; if it remains up will adjust binders  5. HTN/volume - BB and volulme control UF 1.8 Mon - has refused some meds  6. Nutrition - ^ pro renal diet and multivit/Breeze  7. Bilateral foot cellulits; left improved - empiric  Vanc and Zosyn; MRIs neg except could not excluded osteo in 5th left phalanx - seen by ortho - treat conservatively ABIs = right med fem art appeared occluded 8. A fib with RVR and supratheraeutic INR on adm was lab error - INR corrected and in goal now - will need to hold if surgery 9. Social situation - needs SNF placement; unable to return to home at the point of being condemned; would consider evaluation for competency  Sheffield Slider, PA-C [facility] Beeper [phone] 02/04/2013,10:40 AM  LOS: 5 days    Additional Objective Labs: Lab Results  Component Value Date   INR 3.16* 02/04/2013   INR 2.94* 02/03/2013   INR 2.33* 02/02/2013   Basic Metabolic Panel:  Recent Labs Lab 01/31/13 0503 02/01/13 0815 02/02/13 0855 02/02/13 1430 02/03/13 1105  NA 138 136 138 136 137  K 4.3 4.5 5.0 4.9 4.6  CL 99 95* 96 94* 94*  CO2 25 24 24 23 25   GLUCOSE 144* 152* 80 105* 141*  BUN 25* 43* 57* 59* 30*  CREATININE 4.08* 6.11* 7.52* 7.80* 5.19*  CALCIUM 8.6 9.0 9.2 9.1 9.2  PHOS 4.3 5.7*  --  6.9*  --    Liver Function Tests:  Recent Labs Lab 01/30/13 1541  01/31/13 0503 02/01/13 0815 02/02/13 1430  AST 14  --   --   --   --  ALT 12  --   --   --   --   ALKPHOS 141*  --   --   --   --   BILITOT 1.7*  --   --   --   --   PROT 7.3  --   --   --   --   ALBUMIN 3.4*  < > 2.7* 2.5* 2.5*  < > = values in this interval not displayed. No results found for this basename: LIPASE, AMYLASE,  in the last 168 hours CBC:  Recent Labs Lab 01/30/13 1541 01/31/13 0503 02/01/13 0815 02/02/13 0855 02/03/13 1108  WBC 14.9* 13.9* 14.4* 13.0* 12.9*  NEUTROABS 13.4* 11.9*  --  10.7*  --   HGB 13.7 11.9* 12.5* 13.0 14.0  HCT 41.7 37.1* 38.5* 40.1 43.4  MCV 96.1 95.9 96.5 95.2 96.4  PLT 344 224 246 305 322  Cardiac Enzymes:  Recent Labs Lab 02/03/13 1650 02/03/13 2240 02/04/13 0525  TROPONINI <0.30 <0.30 <0.30   CBG:  Recent Labs Lab 02/03/13 0738  02/03/13 1157 02/03/13 1713 02/03/13 2026 02/04/13 0740  GLUCAP 99 130* 102* 112* 131*  Studies/Results: Ir Shuntogram/ Fistulagram Right Mod Sed  02/03/2013   CLINICAL DATA:  Reduced access flow via right forearm dialysis AV fistula.  EXAM: DIALYSIS AV FISTULOGRAM  CONTRAST:  65mL OMNIPAQUE IOHEXOL 300 MG/ML  SOLN  COMPARISON:  None.  FLUOROSCOPY TIME:  36 seconds.  ACCESS: If there remains decrease in flow rates, vascular surgical consultation is recommended for potential aneurysm resection/plication.  PROCEDURE: The procedure, risks, benefits, and alternatives were explained to the patient. Questions regarding the procedure were encouraged and answered. The patient understands and consents to the procedure.  The right arm native fistula was prepped with Betadine in a sterile fashion, and a sterile drape was applied covering the operative field. A diagnostic fistulogram was performed via an 18 gauge angiocatheter introduced into venous outflow. Venous drainage was assessed to the level of the central veins in the chest. Proximal fistula was studied by reflux maneuver with temporary compression of venous outflow. After the procedure, the angiocatheter was removed and hemostasis obtained with manual compression.  COMPLICATIONS: None  FINDINGS: A patent right arm Cimino fistula is present. 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 The rest of the cephalic vein shows normal patency. At the antecubital fossa, there is equal competing venous outflow a in the cephalic vein and the deep venous system. Central veins are normally patent including the SVC.  IMPRESSION: Patent right arm Cimino fistula. 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 This may be contributing to reduced overall access flow in the fistula. Competing venous outflow was noted in the cephalic vein and deep venous system at the  level of the upper arm.   Electronically Signed   By: Irish Lack M.D.   On: 02/03/2013 17:13   Medications:   . calcium acetate  1,334 mg Oral TID WC  . collagenase   Topical Daily  . doxercalciferol  3 mcg Intravenous Q M,W,F-HD  . feeding supplement (RESOURCE BREEZE)  1 Container Oral TID WC  . insulin aspart  0-9 Units Subcutaneous TID WC  . metoprolol  50 mg Oral BID  . multivitamin  1 tablet Oral QHS  . pantoprazole  40 mg Oral Daily  . piperacillin-tazobactam (ZOSYN)  IV  2.25 g Intravenous Q8H  . sodium chloride  3 mL Intravenous Q12H  . sodium chloride  3 mL Intravenous Q12H  . vancomycin  1,000 mg Intravenous Q M,W,F-HD  . Warfarin - Pharmacist Dosing Inpatient   Does not apply [phone]

## 2013-02-05 DIAGNOSIS — I4891 Unspecified atrial fibrillation: Secondary | ICD-10-CM

## 2013-02-05 DIAGNOSIS — I428 Other cardiomyopathies: Secondary | ICD-10-CM

## 2013-02-05 DIAGNOSIS — N186 End stage renal disease: Secondary | ICD-10-CM

## 2013-02-05 LAB — GLUCOSE, CAPILLARY
Glucose-Capillary: 135 mg/dL — ABNORMAL HIGH (ref 70–99)
Glucose-Capillary: 145 mg/dL — ABNORMAL HIGH (ref 70–99)

## 2013-02-05 LAB — PROTIME-INR
INR: 3.89 — ABNORMAL HIGH (ref 0.00–1.49)
INR: 3.84 — ABNORMAL HIGH (ref 0.00–1.49)
Prothrombin Time: 36.7 seconds — ABNORMAL HIGH (ref 11.6–15.2)
Prothrombin Time: 36.3 seconds — ABNORMAL HIGH (ref 11.6–15.2)

## 2013-02-05 MED ORDER — METOPROLOL TARTRATE 100 MG PO TABS
100.0000 mg | ORAL_TABLET | Freq: Two times a day (BID) | ORAL | Status: DC
  Administered 2013-02-05 – 2013-02-08 (×7): 100 mg via ORAL
  Filled 2013-02-05 (×10): qty 1

## 2013-02-05 MED ORDER — CALCIUM ACETATE 667 MG PO CAPS
2001.0000 mg | ORAL_CAPSULE | Freq: Three times a day (TID) | ORAL | Status: DC
  Administered 2013-02-05 – 2013-02-09 (×9): 2001 mg via ORAL
  Administered 2013-02-10 (×3): 1334 mg via ORAL
  Administered 2013-02-11 – 2013-02-17 (×13): 2001 mg via ORAL
  Filled 2013-02-05 (×39): qty 3

## 2013-02-05 MED ORDER — MAGIC MOUTHWASH
5.0000 mL | Freq: Three times a day (TID) | ORAL | Status: DC
  Administered 2013-02-05 – 2013-02-16 (×20): 5 mL via ORAL
  Filled 2013-02-05 (×39): qty 5

## 2013-02-05 MED ORDER — METOPROLOL TARTRATE 25 MG PO TABS
25.0000 mg | ORAL_TABLET | Freq: Once | ORAL | Status: AC
  Administered 2013-02-05: 25 mg via ORAL
  Filled 2013-02-05: qty 1

## 2013-02-05 MED ORDER — DILTIAZEM HCL 30 MG PO TABS
30.0000 mg | ORAL_TABLET | Freq: Four times a day (QID) | ORAL | Status: DC
  Filled 2013-02-05 (×3): qty 1

## 2013-02-05 NOTE — Progress Notes (Signed)
Subjective:  Patient refused zosyn O/N. Patient remained alert with waxing and waning disorientation and confusion.   Objective: Vital signs in last 24 hours: Filed Vitals:   02/04/13 1624 02/04/13 1803 02/04/13 2104 02/05/13 0500  BP: 136/80 145/90 134/79 129/76  Pulse: 127 132 132 125  Temp: 97.1 F (36.2 C) 97.8 F (36.6 C) 97.5 F (36.4 C) 99.1 F (37.3 C)  TempSrc: Oral Oral Oral Oral  Resp: 18 18 18 18   Height:      Weight: 180 lb 12.4 oz (82 kg)     SpO2: 96% 98% 96% 98%   Weight change: 5 lb 8.2 oz (2.5 kg)  Intake/Output Summary (Last 24 hours) at 02/05/13 0724 Last data filed at 02/04/13 1700  Gross per 24 hour  Intake    300 ml  Output    321 ml  Net    -21 ml   Vitals reviewed. General: resting in bed, in NAD.  HEENT: no scleral icterus Cardiac: RRR, no rubs, murmurs or gallops. JVD + Pulm: bibasilar crackles anteriorly, scattered wheezes. No respiratory distress.  Abd: soft, nontender, nondistended, BS present Ext: warm and well perfused, trace pitting edema bilaterally. Erythema, increased warmth of the right ankle with wound in right malleolus covered with eschar. Left lateral foot with 5mm full thickeness minimum ss output.  Neuro: alert and oriented to person, year, month, and location, cranial nerves II-XII grossly intact, moves all extremities voluntarily.  Legs: Feet:Chronic wounds of the left lateral sole of foot with 5 mm full thickness breakdown. No obvious bone exposure no drainage    Lab Results: Basic Metabolic Panel:  Recent Labs Lab 02/02/13 1430 02/03/13 1105 02/04/13 1242  NA 136 137 136  K 4.9 4.6 4.5  CL 94* 94* 96  CO2 23 25 24   GLUCOSE 105* 141* 156*  BUN 59* 30* 46*  CREATININE 7.80* 5.19* 6.85*  CALCIUM 9.1 9.2 9.3  PHOS 6.9*  --  6.2*   Liver Function Tests: CBC:  Recent Labs Lab 01/31/13 0503  02/02/13 0855 02/03/13 1108 02/04/13 1242  WBC 13.9*  < > 13.0* 12.9* 13.3*  NEUTROABS 11.9*  --  10.7*  --   --    HGB 11.9*  < > 13.0 14.0 13.1  HCT 37.1*  < > 40.1 43.4 40.4  MCV 95.9  < > 95.2 96.4 95.3  PLT 224  < > 305 322 380  < > = values in this interval not displayed. CBG:  Recent Labs Lab 02/03/13 1157 02/03/13 1713 02/03/13 2026 02/04/13 0740 02/04/13 1206 02/04/13 1730  GLUCAP 130* 102* 112* 131* 171* 94   Hemoglobin A1C:  Recent Labs Lab 01/31/13 0135  HGBA1C 6.5*   Coagulation:  Recent Labs Lab 02/01/13 0815 02/02/13 0605 02/03/13 1105 02/04/13 0525  LABPROT 18.8* 24.8* 29.6* 31.3*  INR 1.62* 2.33* 2.94* 3.16*    Studies/Results: No results found. Medications: I have reviewed the patient's current medications. Scheduled Meds: . calcium acetate  1,334 mg Oral TID WC  . collagenase   Topical Daily  . doxercalciferol  3 mcg Intravenous Q M,W,F-HD  . feeding supplement (RESOURCE BREEZE)  1 Container Oral TID WC  . insulin aspart  0-9 Units Subcutaneous TID WC  . metoprolol  75 mg Oral BID  . multivitamin  1 tablet Oral QHS  . pantoprazole  40 mg Oral Daily  . piperacillin-tazobactam (ZOSYN)  IV  2.25 g Intravenous Q8H  . sodium chloride  3 mL Intravenous Q12H  . sodium  chloride  3 mL Intravenous Q12H  . vancomycin  1,000 mg Intravenous Q M,W,F-HD   Continuous Infusions:  PRN Meds:.sodium chloride, acetaminophen, acetaminophen, sodium chloride Assessment/Plan: Mr. Chaney is a 72 year old man with PMH of A.fib (on coumadin, BB), ESRD on HD, DM2, presenting with encephalopathy and sepsis due to bilateral foot ulcers.   Sepsis 2/2 bil foot wounds and right ankle cellulitis (Wound Bil Feet)  The patient has bilateral feet wound concerning for cellulitis and possible deep infection including OM. Given tachycardia, AMS and leukocytosis at presentation, this represented severe sepsis that has now resolved. CXR clear aside from 4 rib fractures with no effusion, pulm edema, or consolidation concerning for PNA. Patient's neck is supple. No lateralizing neuro findings,  thus meningitis is less likely. XR of Left foot and Right ankle did not rule in/out OM. He continues to improve clinically, MRI bl feet demonstrated possible OM of left 5th digit, myositis of right foot and left foot. No evidence of septic joint at this time. Diffuse cellulitis is present.  - Continue Vanc Zosyn IV per pharm  - Consulted Ortho for eval and treatment (Dr. Ranell Patrick) who rec continuation of ABX and discahrge with outpatient f/u with Dr. Lajoyce Corners. Dr. Lajoyce Corners rec Santyl and ABI with possible vasc surgery eval. Pt may require surgical debridement during hospitalization. Dr. Lajoyce Corners will continue to follow.  ESRD c/b Hyperkalemia - (resolved). He had missed 2 hemodialysis sessions the week prior to his presentation with K of 6.9 that has neem now stabilized after emergent HD.  - HD per nephrology, plan for another session on Monday with fistulagram.   Acute Encephalopathy  Etiology of patients acute encephalopathy is likely multifactorial. Likely causes include UTI, sepsis, bil foot infection, Uremia, and drug side effect, and baseline dementia. Patient's neck is supple. No lateralizing neuro findings, thus meningitis is less likely. Mental status has much improved thought he may have cognitive impairment at baseline. Mini Cog with normal clock face, 2/3 five minute recall on day two of admission. However, the patient continues to have intermittent episodes of confusion and agitation.  - See sepsis plan  - Patient appears to not tolerate opiates after he becomes altered with even low dose Vicodin.  Plan to use tylenol prn.  SVT (Afib with RVR)  Patients SVT is of unknown etiology (likely afib with RVR) at this time. Patient reports not taking his rate control medications in the two days before admission. Likely he has a history of SVT with RVR that is rate controlled with an atypical regimen in metoprolol 50 mg BID and labetalol 100 mg bid. On 02/02/13 the patient refused his rate control meds. Overnight  he became tachycardic to 140's. In the AM, he was agreeable to taking his HR meds. - Will hold labetalol given possible sepsis.  - Increased Metoprolol to 75 mg BID yesterday due to HR 110-125. - Consulted Cardiology (Dr. Mayford Knife is patients cardiologist)  Rib fractures  Multiple rib fractures. Patient is breathing comfortably. Will hold narcotics given AMS and administer tylenol prn. No signs of paradoxical chest wall movement at this time. Thus, no concern for flail chest. Of note, binders are contraindicated as they may increase risk of respiratory complications. His pain is not well controlled with Tylenol alone and he is at risk of acute delirium 2/2 pain.  -Start small dose of Vicodin -Incentive spirometry  Supra therapeutic INR - He is on coumadin Therapy for Afib  Patients INR was >10, but recheck showed INR 1.32.  Thus the initial was likely a lab error. Patient was started on coumadin per pharmacy. He became therapeutic.  - Holding coumadin for upcoming arteriography. Last dose was on 02/03/13. Will talk with vascular surgery about Vit K for reversal if they want to complete arteriogram on Friday.  DM II  Home lantus dose is 58 U qhs. Decreased to 40 U while inpatient. SSI sensitive. CBG qhc and hs. Hg A1C of 6.5% Low blood sugar this AM in 50's. Likely due to NPO status. Plan to decrease Lantus to 35 U tonight.  SW  Patient was brought from home that is filled with trash, excrement, dirty diapers and rodents. Son feels that house needs to be condemned. Patient will likely need SNF placement for rehab.  - Consult SW  DVT PPx: Hep sq TID  Dispo: Disposition is deferred at this time, awaiting improvement of current medical problems.   The patient does have a current PCP Aida Puffer, MD) and does not need an [facility] hospital follow-up appointment after discharge.  The patient does have transportation limitations that hinder transportation to clinic appointments.  .Services Needed at  time of discharge: Y = Yes, Blank = No PT: Pending eval  OT: Pending eval  RN:   Equipment: Pending eval  Other:     LOS: 6 days   Pleas Koch, MD 02/05/2013, 7:24 AM

## 2013-02-05 NOTE — Progress Notes (Signed)
I have personally seen and examined this patient and agree with the assessment/plan as outlined above by [facility]. Plan noted for LE  Angiogram after INR lower- recommend cardiology involved for his A.fibrillation with RVR and cardiac clearance  Makael Stein K.,MD 02/05/2013 11:35 AM

## 2013-02-05 NOTE — Progress Notes (Signed)
[facility] Progress Note  Subjective:   Cannot explain plan for leg. Doesn't want to eat anything except grits - meal assistance by staff  Objective Filed Vitals:   02/04/13 1624 02/04/13 1803 02/04/13 2104 02/05/13 0500  BP: 136/80 145/90 134/79 129/76  Pulse: 127 132 132 125  Temp: 97.1 F (36.2 C) 97.8 F (36.6 C) 97.5 F (36.4 C) 99.1 F (37.3 C)  TempSrc: Oral Oral Oral Oral  Resp: 18 18 18 18   Height:      Weight: 82 kg (180 lb 12.4 oz)     SpO2: 96% 98% 96% 98%   Physical Exam General: NAD supine in bed  Heart: tachy irreg Lungs: bilateral wheezes greater on right Abdomen: soft NT Extremities: no sig LE edema; right foot wrapped appears less erythematous Dialysis Access: left AVF + bruit patent   Outpatient dialysis schedule: MWF [facility] 3 3/4 hours 180NR BFR 450 A1.5 EDW 81 kg 3K2.25 Ca dialysate UF profile 4 Machine temp 36 Access right AVF Needle gauge 15 Meds: doxercalciferol 3 mcg TIW, heparin 7800 units   Assessment/Plan:  1. Confusion/AMS - nearing baseline - still confused about plans  2. ESRD - MWF HD - hyperkalemia on admission resolved with HD; Fgram in IR Dr. Fredia Sorrow Monday showed:Patent right arm Cimino fistula. 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 This may be contributing to reduced overall access flow in the fistula. Competing venous outflow was noted in the cephalic vein and deep venous system at the level of the upper arm." Also previously on a 3 K bath as an outpt may have been contributory. Follow AVF conservatively for now. HD Friday per routine 3. Anemia - Hgb > 12 ESA on hold  4. Secondary hyperparathyroidism - 2 Ca bath, hectorol 3; P 6.2 ^ phoslo to 3 ac 5. HTN/volume - BB and volulme control UF 1.8 Mon - UF only 320 cc Wed with post weight 82 (bed scale); volume not removed due to ^ HR yesterday while on HD 6. Nutrition - ^ pro renal diet and multivit/Breeze   7. Bilateral foot cellulits; left improved - empiric Vanc and Zosyn; MRIs neg except could not excluded osteo in 5th left phalanx - seen by ortho - treat conservatively ABIs = right med fem art appeared occluded Arteriogram planned for Monday  8. A fib with RVR - correcting INR - warfarin on hold; metoprolol ^ to 75 bid - but no improvement in cardiac status; apparently he was on an atypical regimen of 50 metoprolol bid and 100 labetolol bid prior to admission - may need a cardiology consult - has seen Dr. Mayford Knife in the past; he was kept on a 4 K bath for most of his treatment even with K 4.5 - Will d/w with teaching service 9. Social situation - needs SNF placement; unable to return to home at the point of being condemned; would consider evaluation for competency   Sheffield Slider, [facility] Beeper [phone] 02/05/2013,9:09 AM  LOS: 6 days    Additional Objective Labs: Lab Results  Component Value Date   INR 3.89* 02/05/2013   INR 3.16* 02/04/2013   INR 2.94* 02/03/2013    Basic Metabolic Panel:  Recent Labs Lab 02/01/13 0815  02/02/13 1430 02/03/13 1105 02/04/13 1242  NA 136  < > 136 137 136  K 4.5  < > 4.9 4.6 4.5  CL 95*  < > 94* 94* 96  CO2  24  < > 23 25 24   GLUCOSE 152*  < > 105* 141* 156*  BUN 43*  < > 59* 30* 46*  CREATININE 6.11*  < > 7.80* 5.19* 6.85*  CALCIUM 9.0  < > 9.1 9.2 9.3  PHOS 5.7*  --  6.9*  --  6.2*  < > = values in this interval not displayed. Liver Function Tests:  Recent Labs Lab 01/30/13 1541  02/01/13 0815 02/02/13 1430 02/04/13 1242  AST 14  --   --   --   --   ALT 12  --   --   --   --   ALKPHOS 141*  --   --   --   --   BILITOT 1.7*  --   --   --   --   PROT 7.3  --   --   --   --   ALBUMIN 3.4*  < > 2.5* 2.5* 2.5*  < > = values in this interval not displayed. No results found for this basename: LIPASE, AMYLASE,  in the last 168 hours CBC:  Recent Labs Lab 01/30/13 1541 01/31/13 0503 02/01/13 0815  02/02/13 0855 02/03/13 1108 02/04/13 1242  WBC 14.9* 13.9* 14.4* 13.0* 12.9* 13.3*  NEUTROABS 13.4* 11.9*  --  10.7*  --   --   HGB 13.7 11.9* 12.5* 13.0 14.0 13.1  HCT 41.7 37.1* 38.5* 40.1 43.4 40.4  MCV 96.1 95.9 96.5 95.2 96.4 95.3  PLT 344 224 246 305 322 380  Cardiac Enzymes:  Recent Labs Lab 02/03/13 1650 02/03/13 2240 02/04/13 0525  TROPONINI <0.30 <0.30 <0.30   CBG:  Recent Labs Lab 02/03/13 2026 02/04/13 0740 02/04/13 1206 02/04/13 1730 02/05/13 0739  GLUCAP 112* 131* 171* 94 135*  Medications:   . calcium acetate  1,334 mg Oral TID WC  . collagenase   Topical Daily  . doxercalciferol  3 mcg Intravenous Q M,W,F-HD  . feeding supplement (RESOURCE BREEZE)  1 Container Oral TID WC  . insulin aspart  0-9 Units Subcutaneous TID WC  . magic mouthwash  5 mL Oral TID  . metoprolol  75 mg Oral BID  . multivitamin  1 tablet Oral QHS  . pantoprazole  40 mg Oral Daily  . piperacillin-tazobactam (ZOSYN)  IV  2.25 g Intravenous Q8H  . sodium chloride  3 mL Intravenous Q12H  . sodium chloride  3 mL Intravenous Q12H  . vancomycin  1,000 mg Intravenous Q M,W,F-HD

## 2013-02-05 NOTE — Consult Note (Signed)
Admit date: 01/30/2013 Referring Physician  Dr. Allena Katz Primary Physician Aida Puffer, MD Primary Cardiologist  Dr. Mayford Knife Reason for Consultation  atrial fibrillation with rapid ventricular response  HPI: 72 year old male with end-stage renal disease on hemodialysis with severe peripheral vascular disease as complicated by diabetes with atrial fibrillation and rapid ventricular response. Currently he is on metoprolol previously 50 mg twice a day increased to 75 mg twice a day but still has heart rates usually maintaining in the 110 to 130 range.  In review of previous notes, he may have been on a regimen of metoprolol 50 mg twice a day and labetalol 100 mg twice a day. He has refused medications in the past. He denies any symptoms currently in association with his atrial fibrillation, no shortness of breath, no chest pain, no dizziness. He feels as though his heart rate is improved although it still remains greater than 110.      PMH:   Past Medical History  Diagnosis Date  . Renal disorder   . Diabetes mellitus   . Hypertension   . Anemia   . Hyperlipidemia   . Thyroid disease   . Anxiety   . Skin cancer     PSH:   Past Surgical History  Procedure Laterality Date  . Av fistula placement     Allergies:  Ace inhibitors and Lisinopril Prior to Admit Meds:   Prior to Admission medications   Medication Sig Start Date End Date Taking? Authorizing Provider  calcium acetate, Phos Binder, (PHOSLYRA) 667 MG/5ML SOLN 3 tablets daily with meals   Yes Historical Provider, MD  gabapentin (NEURONTIN) 300 MG capsule Take 300 mg by mouth at bedtime.   Yes Historical Provider, MD  HYDROcodone-acetaminophen (NORCO/VICODIN) 5-325 MG per tablet Take 1 tablet by mouth every 4 (four) hours as needed. 01/29/13  Yes Candyce Churn, MD  insulin glargine (LANTUS) 100 UNIT/ML injection Inject 58 Units into the skin at bedtime.   Yes Historical Provider, MD  labetalol (NORMODYNE) 200 MG tablet Take  100 mg by mouth 2 (two) times daily. Takes 1/2 tablet twice daily.   Yes Historical Provider, MD  lidocaine-prilocaine (EMLA) cream Apply 1 application topically 3 (three) times a week. Dialysis med   Yes Historical Provider, MD  metoprolol (LOPRESSOR) 50 MG tablet Take 50 mg by mouth 2 (two) times daily.   Yes Historical Provider, MD  omeprazole (PRILOSEC) 20 MG capsule Take 20 mg by mouth daily.   Yes Historical Provider, MD  warfarin (COUMADIN) 5 MG tablet Take 5 mg by mouth daily.   Yes Historical Provider, MD   Fam HX:    Family History  Problem Relation Age of Onset  . Asthma Father    Social HX:    History   Social History  . Marital Status: Divorced    Spouse Name: N/A    Number of Children: N/A  . Years of Education: N/A   Occupational History  . Not on file.   Social History Main Topics  . Smoking status: Current Every Day Smoker -- 1.00 packs/day for 55 years    Types: Cigarettes    Start date: 02/26/1957  . Smokeless tobacco: Never Used  . Alcohol Use: No  . Drug Use: No  . Sexual Activity: Not on file   Other Topics Concern  . Not on file   Social History Narrative  . No narrative on file     ROS:  All 11 ROS were addressed and are negative except  what is stated in the HPI   Physical Exam: Blood pressure 145/71, pulse 126, temperature 98 F (36.7 C), temperature source Oral, resp. rate 18, height 5\' 10"  (1.778 m), weight 180 lb 12.4 oz (82 kg), SpO2 98.00%.   General: Well developed, well nourished, in no acute distress Head: Eyes PERRLA, No xanthomas.   Normal cephalic and atramatic  Lungs:   Clear bilaterally to auscultation and percussion. Normal respiratory effort. No wheezes, no rales. Heart:  Irregularly irregular, tachycardic. No murmur, rubs, gallops.  No carotid bruit. No JVD.  No abdominal bruits.  Abdomen: Bowel sounds are positive, abdomen soft and non-tender without masses. No hepatosplenomegaly. Msk:  Back normal. Normal strength and tone  for age. Extremities:  Right foot/lateral pretibial region erythematous. Neuro: Alert, non-focal, MAE x 4 GU: Deferred Rectal: Deferred Psych:  Good affect, responds appropriately, somewhat depressed mood      Labs: Lab Results  Component Value Date   WBC 13.3* 02/04/2013   HGB 13.1 02/04/2013   HCT 40.4 02/04/2013   MCV 95.3 02/04/2013   PLT 380 02/04/2013     Recent Labs Lab 01/30/13 1541  02/04/13 1242  NA 131*  < > 136  K 6.9*  < > 4.5  CL 91*  < > 96  CO2 22  < > 24  BUN 60*  < > 46*  CREATININE 8.02*  < > 6.85*  CALCIUM 9.9  < > 9.3  PROT 7.3  --   --   BILITOT 1.7*  --   --   ALKPHOS 141*  --   --   ALT 12  --   --   AST 14  --   --   GLUCOSE 184*  < > 156*  < > = values in this interval not displayed.  Recent Labs  02/03/13 1650 02/03/13 2240 02/04/13 0525  TROPONINI <0.30 <0.30 <0.30   EKG: 02/03/13: Atrial fibrillation heart rate 120, rapid ventricular response, nonspecific ST changes. Personally viewed. I have also reviewed telemetry as well.  Echocardiogram 10/16/11-moderate to severe LVH, normal ejection fraction of 65%, grade 1 diastolic dysfunction, moderate aortic sclerosis with no stenosis. Left atrium was mildly dilated.  Interestingly, echocardiogram on 9/14 demonstrated ejection fraction of 20%. He refused cardiac catheterization at that time. He also did not undergo stress testing at that time either.  ASSESSMENT/PLAN:   72 year old male with end-stage renal disease on hemodialysis, severe peripheral vascular disease as a complication of diabetes on antibiotics for ulcerated right lower extremity with atrial fibrillation, persistent with rapid ventricular response. Ejection fraction 9/14 interestingly had reduced to 20% from normal last year. This echocardiogram was found on outpatient records.   1. Atrial fibrillation with rapid ventricular response - likely exacerbated by current infection. Given his cardiomyopathy, I would like to increase  his beta blocker first and we'll move him from 75 mg twice a day to 100 mg twice a day. I will give him an extra 25 mg of metoprolol now. Next step would be utilization of amiodarone. Given his ejection fraction of 20% on 9/14 in outpatient setting, I would tend to avoid diltiazem due to negative inotropic properties. Hopefully as infection clears, heart rate will as well. He remains hemodynamically stable which is reassuring. He should be able to tolerate angiogram.   2. Cardiomyopathy-EF reduction from 65% in 2013 down to 20% in 2014. Tachycardia from atrial fibrillation may be playing a role. In review of Dr. Norris Cross clinic note from September 2014 he did not  wish to pursue cardiac catheterization nor stress testing. Continue to utilize beta blocker. No ACE inhibitor because of hemodialysis.  Donato Schultz, MD  02/05/2013  1:12 PM

## 2013-02-05 NOTE — Progress Notes (Signed)
UR Completed.  Fredericka Bottcher Jane [phone] 02/05/2013  

## 2013-02-05 NOTE — Progress Notes (Signed)
Patient ID: Chris Vance, male   DOB: [birth year], 71 y.o.   MRN: [id number] Vascular Surgery Progress Note  Subjective: Ischemic ulcer right ankle secondary to femoral popliteal and tibial occlusive disease. Patient needs angiography. INR continues to rise 3.89 today. Yesterday I ordered Coumadin to be discontinued  Objective:  Filed Vitals:   02/05/13 0500  BP: 129/76  Pulse: 125  Temp: 99.1 F (37.3 C)  Resp: 18    Ulceration right ankle stable at 1-1/2-2 cm in diameter over lateral malleolus   Labs:  Recent Labs Lab 02/02/13 1430 02/03/13 1105 02/04/13 1242  CREATININE 7.80* 5.19* 6.85*    Recent Labs Lab 02/02/13 1430 02/03/13 1105 02/04/13 1242  NA 136 137 136  K 4.9 4.6 4.5  CL 94* 94* 96  CO2 23 25 24   BUN 59* 30* 46*  CREATININE 7.80* 5.19* 6.85*  GLUCOSE 105* 141* 156*  CALCIUM 9.1 9.2 9.3    Recent Labs Lab 02/02/13 0855 02/03/13 1108 02/04/13 1242  WBC 13.0* 12.9* 13.3*  HGB 13.0 14.0 13.1  HCT 40.1 43.4 40.4  PLT 305 322 380    Recent Labs Lab 02/03/13 1105 02/04/13 0525 02/05/13 0600  INR 2.94* 3.16* 3.89*    I/O last 3 completed shifts: In: 350 [P.O.:300; IV Piggyback:50] Out: 322 [Other:321; Stool:1]  Imaging: No results found.  Assessment/Plan:   LOS: 6 days  s/p   Plan is for angiography to be performed on Monday per Dr. Edilia Bo assuming INR has been corrected. May need vitamin K. Continue to hold Coumadin   Josephina Gip, MD 02/05/2013 7:47 AM

## 2013-02-05 NOTE — Progress Notes (Signed)
    Day 6 of stay      Patient name: Chris Vance  Medical record number: [id number]  Date of birth: [birth year]  Summary: 72 y.o. male with altered sensorium, foot wounds, ESRD on HD and afib with rvr. Oriented to time and person but not to place. Denise pain anywhere.    Plan We are treating the foot wounds in consultation with ortho, and obtained a vascular consult today which suggests performing angiography on Monday. I agree with cardiology on adding amiodarone and not CCB due to its negative inotropic effects, given the low EF which I had not seen yesterday. The patient has high INR, coumadin is discontinued and it would be okay to give Vitamin K if needed prior to surgery.    I have seen and evaluated this patient and discussed it with my IM resident team.  Please see the rest of the plan per resident note from today.   Phillippe Orlick 02/05/2013, 2:29 PM.

## 2013-02-06 DIAGNOSIS — Z5189 Encounter for other specified aftercare: Secondary | ICD-10-CM

## 2013-02-06 LAB — RENAL FUNCTION PANEL
Albumin: 2.6 g/dL — ABNORMAL LOW (ref 3.5–5.2)
BUN: 36 mg/dL — ABNORMAL HIGH (ref 6–23)
CO2: 24 mEq/L (ref 19–32)
Calcium: 9.8 mg/dL (ref 8.4–10.5)
Chloride: 98 mEq/L (ref 96–112)
Creatinine, Ser: 6.02 mg/dL — ABNORMAL HIGH (ref 0.50–1.35)
GFR calc Af Amer: 10 mL/min — ABNORMAL LOW (ref >90)
GFR calc non Af Amer: 8 mL/min — ABNORMAL LOW (ref >90)
Glucose, Bld: 139 mg/dL — ABNORMAL HIGH (ref 70–99)
Phosphorus: 5.4 mg/dL — ABNORMAL HIGH (ref 2.3–4.6)
Potassium: 4.5 mEq/L (ref 3.5–5.1)
Sodium: 140 mEq/L (ref 135–145)

## 2013-02-06 LAB — GLUCOSE, CAPILLARY
Glucose-Capillary: 117 mg/dL — ABNORMAL HIGH (ref 70–99)
Glucose-Capillary: 133 mg/dL — ABNORMAL HIGH (ref 70–99)
Glucose-Capillary: 196 mg/dL — ABNORMAL HIGH (ref 70–99)

## 2013-02-06 LAB — CBC
HCT: 42 % (ref 39.0–52.0)
Hemoglobin: 13.7 g/dL (ref 13.0–17.0)
MCH: 31.4 pg (ref 26.0–34.0)
MCHC: 32.6 g/dL (ref 30.0–36.0)
MCV: 96.3 fL (ref 78.0–100.0)
Platelets: 371 10*3/uL (ref 150–400)
RBC: 4.36 MIL/uL (ref 4.22–5.81)
RDW: 17.1 % — ABNORMAL HIGH (ref 11.5–15.5)
WBC: 15.6 10*3/uL — ABNORMAL HIGH (ref 4.0–10.5)

## 2013-02-06 LAB — HEPATIC FUNCTION PANEL
ALT: 11 U/L (ref 0–53)
AST: 16 U/L (ref 0–37)
Albumin: 2.6 g/dL — ABNORMAL LOW (ref 3.5–5.2)
Total Protein: 6.2 g/dL (ref 6.0–8.3)

## 2013-02-06 LAB — PROTIME-INR
INR: 3.92 — ABNORMAL HIGH (ref 0.00–1.49)
Prothrombin Time: 36.9 seconds — ABNORMAL HIGH (ref 11.6–15.2)

## 2013-02-06 MED ORDER — SODIUM CHLORIDE 0.9 % IV SOLN: 100.0000 mL | INTRAVENOUS | Status: DC | PRN

## 2013-02-06 MED ORDER — VITAMIN K1 10 MG/ML IJ SOLN
5.0000 mg | Freq: Once | INTRAVENOUS | Status: AC
  Administered 2013-02-06: 5 mg via INTRAVENOUS
  Filled 2013-02-06: qty 0.5

## 2013-02-06 MED ORDER — ALTEPLASE 2 MG IJ SOLR
2.0000 mg | Freq: Once | INTRAMUSCULAR | Status: DC | PRN
  Filled 2013-02-06: qty 2

## 2013-02-06 MED ORDER — HEPARIN SODIUM (PORCINE) 1000 UNIT/ML DIALYSIS: 1000.0000 [IU] | INTRAMUSCULAR | Status: DC | PRN

## 2013-02-06 MED ORDER — PENTAFLUOROPROP-TETRAFLUOROETH EX AERO: 1.0000 "application " | INHALATION_SPRAY | CUTANEOUS | Status: DC | PRN

## 2013-02-06 MED ORDER — LIDOCAINE HCL (PF) 1 % IJ SOLN: 5.0000 mL | INTRAMUSCULAR | Status: DC | PRN

## 2013-02-06 MED ORDER — DOXERCALCIFEROL 4 MCG/2ML IV SOLN
INTRAVENOUS | Status: AC
  Administered 2013-02-06: 3 ug via INTRAVENOUS
  Filled 2013-02-06: qty 2

## 2013-02-06 MED ORDER — LIDOCAINE-PRILOCAINE 2.5-2.5 % EX CREA
1.0000 "application " | TOPICAL_CREAM | CUTANEOUS | Status: DC | PRN
  Filled 2013-02-06: qty 5

## 2013-02-06 MED ORDER — AMIODARONE HCL 200 MG PO TABS
200.0000 mg | ORAL_TABLET | Freq: Two times a day (BID) | ORAL | Status: DC
  Administered 2013-02-06 – 2013-02-17 (×20): 200 mg via ORAL
  Filled 2013-02-06 (×24): qty 1

## 2013-02-06 MED ORDER — NEPRO/CARBSTEADY PO LIQD
237.0000 mL | ORAL | Status: DC | PRN
  Filled 2013-02-06: qty 237

## 2013-02-06 MED ORDER — HEPARIN SODIUM (PORCINE) 1000 UNIT/ML DIALYSIS: 20.0000 [IU]/kg | INTRAMUSCULAR | Status: DC | PRN

## 2013-02-06 NOTE — Progress Notes (Signed)
Subjective:  Patient is tired. A+O x 2 (person and place but thinks it is 1994). Patient remained alert with waxing and waning disorientation and confusion. No complaints. Says his appetite is better.  Objective: Vital signs in last 24 hours: Filed Vitals:   02/06/13 0911 02/06/13 0930 02/06/13 1000 02/06/13 1030  BP: 146/76 139/82 105/79 143/67  Pulse: 114 120 101 116  Temp:      TempSrc:      Resp:      Height:      Weight:      SpO2:       Weight change: -0 oz (-0.001 kg)  Intake/Output Summary (Last 24 hours) at 02/06/13 1049 Last data filed at 02/06/13 0600  Gross per 24 hour  Intake    993 ml  Output      0 ml  Net    993 ml   Vitals reviewed. General: resting in bed, in NAD.  HEENT: no scleral icterus Cardiac: RRR, no rubs, murmurs or gallops. JVD + Pulm: bibasilar crackles anteriorly, scattered wheezes. No respiratory distress.  Abd: soft, nontender, nondistended, BS present Ext: warm and well perfused, trace pitting edema bilaterally. Erythema, increased warmth of the right ankle with wound in right malleolus covered with eschar. Left lateral foot with 5mm full thickeness minimum ss output.  Neuro: alert and oriented to person, year, month, and location, cranial nerves II-XII grossly intact, moves all extremities voluntarily.  Legs: Feet:Chronic wounds of the left lateral sole of foot with 5 mm full thickness breakdown. No obvious bone exposure no drainage    Lab Results: Basic Metabolic Panel:  Recent Labs Lab 02/04/13 1242 02/06/13 0927  NA 136 140  K 4.5 4.5  CL 96 98  CO2 24 24  GLUCOSE 156* 139*  BUN 46* 36*  CREATININE 6.85* 6.02*  CALCIUM 9.3 9.8  PHOS 6.2* 5.4*   Liver Function Tests: CBC:  Recent Labs Lab 01/31/13 0503  02/02/13 0855  02/04/13 1242 02/06/13 0927  WBC 13.9*  < > 13.0*  < > 13.3* 15.6*  NEUTROABS 11.9*  --  10.7*  --   --   --   HGB 11.9*  < > 13.0  < > 13.1 13.7  HCT 37.1*  < > 40.1  < > 40.4 42.0  MCV 95.9  <  > 95.2  < > 95.3 96.3  PLT 224  < > 305  < > 380 371  < > = values in this interval not displayed. CBG:  Recent Labs Lab 02/04/13 1730 02/05/13 0739 02/05/13 1133 02/05/13 1645 02/05/13 2053 02/06/13 0749  GLUCAP 94 135* 154* 156* 145* 149*   Hemoglobin A1C:  Recent Labs Lab 01/31/13 0135  HGBA1C 6.5*   Coagulation:  Recent Labs Lab 02/04/13 0525 02/05/13 0600 02/05/13 1200 02/06/13 0358  LABPROT 31.3* 36.7* 36.3* 36.9*  INR 3.16* 3.89* 3.84* 3.92*    Studies/Results: No results found. Medications: I have reviewed the patient's current medications. Scheduled Meds: . calcium acetate  2,001 mg Oral TID WC  . collagenase   Topical Daily  . doxercalciferol  3 mcg Intravenous Q M,W,F-HD  . feeding supplement (RESOURCE BREEZE)  1 Container Oral TID WC  . insulin aspart  0-9 Units Subcutaneous TID WC  . magic mouthwash  5 mL Oral TID  . metoprolol  100 mg Oral BID  . multivitamin  1 tablet Oral QHS  . pantoprazole  40 mg Oral Daily  . piperacillin-tazobactam (ZOSYN)  IV  2.25 g  Intravenous Q8H  . sodium chloride  3 mL Intravenous Q12H  . sodium chloride  3 mL Intravenous Q12H  . vancomycin  1,000 mg Intravenous Q M,W,F-HD   Continuous Infusions:  PRN Meds:.sodium chloride, sodium chloride, sodium chloride, acetaminophen, acetaminophen, alteplase, feeding supplement (NEPRO CARB STEADY), heparin, heparin, lidocaine (PF), lidocaine-prilocaine, pentafluoroprop-tetrafluoroeth, sodium chloride Assessment/Plan: Mr. Meiner is a 72 year old man with PMH of A.fib (on coumadin, BB), ESRD on HD, DM2, presenting with encephalopathy and sepsis due to bilateral foot ulcers.   Sepsis 2/2 bil foot wounds and right ankle cellulitis (Wound Bil Feet)  The patient has bilateral feet wound concerning for cellulitis and possible deep infection including OM. Given tachycardia, AMS and leukocytosis at presentation, this represented severe sepsis that has now resolved. CXR clear aside from  4 rib fractures with no effusion, pulm edema, or consolidation concerning for PNA. Patient's neck is supple. No lateralizing neuro findings, thus meningitis is less likely. XR of Left foot and Right ankle did not rule in/out OM. He continues to improve clinically, MRI bl feet demonstrated possible OM of left 5th digit, myositis of right foot and left foot. No evidence of septic joint at this time. Diffuse cellulitis is present.  - Continue Vanc Zosyn IV per pharm  - Consulted Ortho for eval and treatment (Dr. Ranell Patrick) who rec continuation of ABX and discahrge with outpatient f/u with Dr. Lajoyce Corners. Dr. Lajoyce Corners rec Santyl and ABI with possible vasc surgery eval. Pt may require surgical debridement during hospitalization. Dr. Lajoyce Corners will continue to follow. Dr. Hart Rochester with vascular surgery plans to perform arteriography on Monday once INR is <1.6 due to concern about vascular insufficiency of the foot.  ESRD c/b Hyperkalemia - (resolved). He had missed 2 hemodialysis sessions the week prior to his presentation with K of 6.9 that has neem now stabilized after emergent HD.  - HD per nephrology, plan for another session on Monday with fistulagram.   Acute Encephalopathy  Etiology of patients acute encephalopathy is likely multifactorial. Likely causes include UTI, sepsis, bil foot infection, Uremia, and drug side effect, and baseline dementia. Patient's neck is supple. No lateralizing neuro findings, thus meningitis is less likely. Mental status has much improved thought he may have cognitive impairment at baseline. Mini Cog with normal clock face, 2/3 five minute recall on day two of admission. However, the patient continues to have intermittent episodes of confusion and agitation.  - See sepsis plan  - Patient appears to not tolerate opiates after he becomes altered with even low dose Vicodin.  Plan to use tylenol prn. - Checking hepatic function panel.  SVT (Afib with RVR)  Patients SVT is of unknown etiology  (likely afib with RVR) at this time. Patient reports not taking his rate control medications in the two days before admission. Likely he has a history of SVT with RVR that is rate controlled with an atypical regimen in metoprolol 50 mg BID and labetalol 100 mg bid. At admission, patient was given Metoprolol 50 mg BID which achieved prompt rate control. On 02/02/13 the patient refused his rate control meds. That night he became tachycardic to 140's. In the AM, he was agreeable to taking his HR meds and HR was less than 110. On 12/11 his HR continued to be >110, therefore consulted Cardiology (Dr. Mayford Knife is patients cardiologist) who increased metoprolol to 100 mg BID and rec starting amio if this does control the rate. HR has been 92-124 over the last 24 hours.  Rib fractures  Multiple rib fractures. Patient is breathing comfortably. Will hold narcotics given AMS and administer tylenol prn. No signs of paradoxical chest wall movement at this time. Thus, no concern for flail chest. Of note, binders are contraindicated as they may increase risk of respiratory complications. His pain is not well controlled with Tylenol alone and he is at risk of acute delirium 2/2 pain.  -Start small dose of Vicodin -Incentive spirometry  Coumadin Treatment  - He is on coumadin Therapy for Afib as outpatient. Initially, patients INR was >10, but recheck showed INR 1.32. Thus the initial was likely a lab error. Patient was started on coumadin per pharmacy. He became therapeutic. Once, Vasc surgery recommended arteriography, coumadin was held.  Last dose was on 02/03/13. May consider reversal with Vit K as INR has continued to trend up (3.92) on 02/06/13.  DM II  Home lantus dose is 58 U qhs. Decreased to 40 U while inpatient. SSI sensitive. CBG qhc and hs. Hg A1C of 6.5% Low blood sugar this AM in 50's. Likely due to NPO status. As aptient has had inconsisten PO intake of food, lantus has been held.  -- SSI  SW  Patient was  brought from home that is filled with trash, excrement, dirty diapers and rodents. Son feels that house needs to be condemned. Patient will likely need SNF placement for rehab.  - Consult SW  DVT PPx: Hep sq TID  Dispo: Disposition is deferred at this time, awaiting improvement of current medical problems.   The patient does have a current PCP Aida Puffer, MD) and does not need an [facility] hospital follow-up appointment after discharge.  The patient does have transportation limitations that hinder transportation to clinic appointments.  .Services Needed at time of discharge: Y = Yes, Blank = No PT: Pending eval  OT: Pending eval  RN:   Equipment: Pending eval  Other:     LOS: 7 days   Pleas Koch, MD 02/06/2013, 10:49 AM

## 2013-02-06 NOTE — Progress Notes (Signed)
OT Cancellation Note  Patient Details Name: Chris Vance MRN: [id number] DOB: 05-11-1940   Cancelled Treatment:    Reason Eval/Treat Not Completed: Patient at procedure or test/ unavailable. Pt still at dialysis, will re attempt tomorrow  Galen Manila 02/06/2013, 11:40 AM

## 2013-02-06 NOTE — Progress Notes (Signed)
Patient ID: Chris Vance, male   DOB: January 20, 1941, 72 y.o.   MRN: [id number] INR equals 3.9 today-going wrong direction Coumadin is on hold Will give vitamin K-5 mg today and recheck INR in a.m.  Patient scheduled for angiogram on Monday if INR is satisfactory

## 2013-02-06 NOTE — Progress Notes (Signed)
Patient's HR 130's-150's,sustaining in the 130's.MD on call notified.No new order for now.Patient just got amiodarone and metoprolol.Will continue to monitor. Nithila Sumners Joselita,RN

## 2013-02-06 NOTE — Procedures (Signed)
Patient seen on Hemodialysis. QB 450, UF goal 2.5L Treatment adjusted as needed.  Zetta Bills MD [facility]. Office # [phone] Pager # [phone] 9:45 AM

## 2013-02-06 NOTE — Progress Notes (Signed)
Patient ID: Chris Vance, male   DOB: [birth year], 72 y.o.   MRN: [id number]   [facility] Progress Note   Assessment/ Plan:   1. Confusion/AMS - I do not know this patient's baseline - he appears to be still confused about plans/management. 2. 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 Currently on HD without problems 3. Anemia - Hgb > 12 ESA on hold  4. Secondary hyperparathyroidism - 2 Ca bath, hectorol 3; elevated phosphorus- binders adjusted 5. HTN/volume - BP fairly controlled, no edema.  6. Nutrition - low albumin likely a reflection of his FTT- continue ONS 7. Bilateral foot cellulits/ ischemia: plans noted for arteriogram on Monday after INR lower 8. A fib with RVR - seen by cardiology and beta-blocker dosing adjusted. Amiodarone in the future if remains with RVR. 9. Social situation - needs SNF placement; unable to return to home at the point of being condemned; would consider evaluation for competency   Subjective:   Reports that he is thirsty at dialysis. Denies chest pain or shortness of breath.   Objective:   BP 139/82  Pulse 120  Temp(Src) 98 F (36.7 C) (Oral)  Resp 18  Ht 5\' 10"  (1.778 m)  Wt 81.999 kg (180 lb 12.4 oz)  BMI 25.94 kg/m2  SpO2 97%  Physical Exam: WUJ:[id number] on dialysis AOZ:HYQMV irregular tachycardia, normal S1 and S2 Resp:Coarse BS bilaterally, no rales, no wheeze Abd: Soft, obese, NT, BS normal Ext:No LE edema. Right foot wrapped.  Labs: BMET  Recent Labs Lab 01/30/13 1541 01/30/13 2013 01/31/13 0503 02/01/13 0815 02/02/13 0855 02/02/13 1430 02/03/13 1105 02/04/13 1242  NA 131* 134* 138 136 138 136 137 136  K 6.9* 6.3* 4.3 4.5 5.0 4.9 4.6 4.5  CL 91* 95* 99 95* 96 94* 94* 96  CO2 22 19 25 24 24 23 25 24   GLUCOSE 184* 176* 144* 152* 80 105* 141* 156*  BUN 60* 66* 25* 43* 57* 59* 30* 46*  CREATININE 8.02* 8.18* 4.08* 6.11* 7.52* 7.80* 5.19* 6.85*  CALCIUM 9.9 9.7  8.6 9.0 9.2 9.1 9.2 9.3  PHOS  --  6.5* 4.3 5.7*  --  6.9*  --  6.2*   CBC  Recent Labs Lab 01/30/13 1541 01/31/13 0503  02/02/13 0855 02/03/13 1108 02/04/13 1242 02/06/13 0927  WBC 14.9* 13.9*  < > 13.0* 12.9* 13.3* 15.6*  NEUTROABS 13.4* 11.9*  --  10.7*  --   --   --   HGB 13.7 11.9*  < > 13.0 14.0 13.1 13.7  HCT 41.7 37.1*  < > 40.1 43.4 40.4 42.0  MCV 96.1 95.9  < > 95.2 96.4 95.3 96.3  PLT 344 224  < > 305 322 380 371  < > = values in this interval not displayed.  Medications:    . calcium acetate  2,001 mg Oral TID WC  . collagenase   Topical Daily  . doxercalciferol  3 mcg Intravenous Q M,W,F-HD  . feeding supplement (RESOURCE BREEZE)  1 Container Oral TID WC  . insulin aspart  0-9 Units Subcutaneous TID WC  . magic mouthwash  5 mL Oral TID  . metoprolol  100 mg Oral BID  . multivitamin  1 tablet Oral QHS  . pantoprazole  40 mg Oral Daily  . piperacillin-tazobactam (ZOSYN)  IV  2.25 g Intravenous Q8H  . sodium chloride  3 mL Intravenous Q12H  . sodium chloride  3 mL Intravenous Q12H  .  vancomycin  1,000 mg Intravenous Q M,W,F-HD   Zetta Bills, MD 02/06/2013, 9:46 AM

## 2013-02-06 NOTE — Clinical Social Work Note (Signed)
CSW continuing to monitor patient's progress and will assist with discharge to a skilled facility at once medically stable. Weekend CSW will follow-up with patient's son to give facility bed offers.  Genelle Bal, MSW, LCSW [phone]

## 2013-02-06 NOTE — Progress Notes (Signed)
       Patient Name: Chris Vance Date of Encounter: 02/06/2013    SUBJECTIVE:Complains of chest heaviness. Started after dialysis. Also c/o shortness of breath. Can't feel palpitations.  TELEMETRY:  A Fib with still poor rate control, but better. Filed Vitals:   02/06/13 1130 02/06/13 1200 02/06/13 1230 02/06/13 1253  BP: 121/51 123/81 125/74 130/73  Pulse: 114 141 106 116  Temp:    97.9 F (36.6 C)  TempSrc:    Oral  Resp:    24  Height:      Weight:    172 lb 6.4 oz (78.2 kg)  SpO2:    99%    Intake/Output Summary (Last 24 hours) at 02/06/13 1400 Last data filed at 02/06/13 1253  Gross per 24 hour  Intake    820 ml  Output   2044 ml  Net  -1224 ml    LABS: Basic Metabolic Panel:  Recent Labs  16/10/96 1242 02/06/13 0927  NA 136 140  K 4.5 4.5  CL 96 98  CO2 24 24  GLUCOSE 156* 139*  BUN 46* 36*  CREATININE 6.85* 6.02*  CALCIUM 9.3 9.8  PHOS 6.2* 5.4*   CBC:  Recent Labs  02/04/13 1242 02/06/13 0927  WBC 13.3* 15.6*  HGB 13.1 13.7  HCT 40.4 42.0  MCV 95.3 96.3  PLT 380 371   Cardiac Enzymes:  Recent Labs  02/03/13 1650 02/03/13 2240 02/04/13 0525  TROPONINI <0.30 <0.30 <0.30   BNP No results found for this basename: probnp    Radiology/Studies:  Not reviewed.  Physical Exam: Blood pressure 130/73, pulse 116, temperature 97.9 F (36.6 C), temperature source Oral, resp. rate 24, height 5\' 10"  (1.778 m), weight 172 lb 6.4 oz (78.2 kg), SpO2 99.00%. Weight change: -0 oz (-0.001 kg)   Tachycardia, irregularly irregular. Confused.   ASSESSMENT:  1. Atrial fib with poor rate control. 2. Confusion 3. Chest discomfort   Plan:  1. Re-cycle cardiac markers 2. Add oral amiodarone. 3. Will need anticoagulation if continues (CHADS > 2)  Signed, Lesleigh Noe 02/06/2013, 2:00 PM

## 2013-02-06 NOTE — Progress Notes (Signed)
ANTIBIOTIC CONSULT NOTE - FOLLOW UP  Pharmacy Consult for Vancomycin and Zosyn Indication: nonhealing foot wound  Allergies  Allergen Reactions  . Ace Inhibitors   . Lisinopril Cough    Patient Measurements: Height: 5\' 10"  (177.8 cm) Weight: 172 lb 6.4 oz (78.2 kg) IBW/kg (Calculated) : 73  Vital Signs: Temp: 97.9 F (36.6 C) (12/12 1253) Temp src: Oral (12/12 1253) BP: 130/73 mmHg (12/12 1253) Pulse Rate: 116 (12/12 1253) Intake/Output from previous day: 12/11 0701 - 12/12 0700 In: 1113 [P.O.:900; I.V.:3; IV Piggyback:200] Out: -  Intake/Output from this shift: Total I/O In: -  Out: 2044 [Other:2044]  Labs:  Recent Labs  02/04/13 1242 02/06/13 0927  WBC 13.3* 15.6*  HGB 13.1 13.7  PLT 380 371  CREATININE 6.85* 6.02*   Estimated Creatinine Clearance: 11.6 ml/min (by C-G formula based on Cr of 6.02).   Microbiology: Recent Results (from the past 720 hour(s))  MRSA PCR SCREENING     Status: None   Collection Time    02/03/13  4:45 PM      Result Value Range Status   MRSA by PCR NEGATIVE  NEGATIVE Final   Comment:            The GeneXpert MRSA Assay (FDA     approved for NASAL specimens     only), is one component of a     comprehensive MRSA colonization     surveillance program. It is not     intended to diagnose MRSA     infection nor to guide or     monitor treatment for     MRSA infections.    Anti-infectives   Start     Dose/Rate Route Frequency Ordered Stop   02/02/13 1200  vancomycin (VANCOCIN) IVPB 1000 mg/200 mL premix     1,000 mg 200 mL/hr over 60 Minutes Intravenous Every M-W-F (Hemodialysis) 01/31/13 0043     01/31/13 0045  vancomycin (VANCOCIN) 1,750 mg in sodium chloride 0.9 % 500 mL IVPB     1,750 mg 250 mL/hr over 120 Minutes Intravenous  Once 01/31/13 0043 01/31/13 0315   01/31/13 0045  piperacillin-tazobactam (ZOSYN) IVPB 2.25 g     2.25 g 100 mL/hr over 30 Minutes Intravenous 3 times per day 01/31/13 0043         Assessment: 72 yo M continues on day # 7 of antibiotic therapy for non-healing foot wound with concern for osteomyelitis on left small toe.  Noted plans for angiogram on Monday if INR acceptable.    No culture data available.  Goal of Therapy:  Pre-HD Vancomycin level ~ 20  Plan:  Continue Vancomycin 1gm IV with each HD Continue Zosyn 2.25gm IV q8h Would recommend a pre-HD Vancomycin level on Monday.  [employer], Pharm.D., BCPS Clinical Pharmacist Pager [phone] 02/06/2013 1:53 PM

## 2013-02-06 NOTE — Progress Notes (Signed)
PT Cancellation Note  Patient Details Name: WILLAIM MODE MRN: [id number] DOB: 07/18/1940   Cancelled Treatment:    Reason Eval/Treat Not Completed: Patient at procedure or test/unavailable Pt at HD   [facility] 02/06/2013, 8:55 AM

## 2013-02-06 NOTE — Progress Notes (Signed)
Internal Medicine Attending  Date: 02/06/2013  Patient name: Chris Vance Medical record number: [id number] Date of birth: Jul 01, 1940 Age: 72 y.o. Gender: male  I saw and evaluated the patient. I reviewed the resident's note by Dr. Glendell Docker and I agree with the resident's findings and plans as documented in his progress note.  Chris Vance remains intermittently confused when seen on rounds this morning immediately after the completion of hemodialysis.  A-fib rate remains suboptimally controlled with heart rates climbing above 110.  From a lab standpoint, his INR continues to rise.  Angiography is scheduled for Monday and we will treat with vitamin K tomorrow if INR is still significantly elevated.  FFP may also be an alternative just prior to planned angiography if INR is just above target pre-procedure.  We will continue current supportive care including adding oral amiodarone in hopes of getting better control of his heart rate (? tachycardic cardiomyopathy).

## 2013-02-06 NOTE — Progress Notes (Signed)
OT Cancellation Note  Patient Details Name: Chris Vance MRN: [id number] DOB: 1940/03/10   Cancelled Treatment:    Reason Eval/Treat Not Completed: Patient at procedure or test/ unavailable. Pt at dialysis, will re attempt later today as time allows/as appropriate  Galen Manila 02/06/2013, 9:40 AM

## 2013-02-07 LAB — PROTIME-INR
INR: 1.77 — ABNORMAL HIGH (ref 0.00–1.49)
Prothrombin Time: 20.1 seconds — ABNORMAL HIGH (ref 11.6–15.2)

## 2013-02-07 LAB — GLUCOSE, CAPILLARY
Glucose-Capillary: 144 mg/dL — ABNORMAL HIGH (ref 70–99)
Glucose-Capillary: 170 mg/dL — ABNORMAL HIGH (ref 70–99)

## 2013-02-07 LAB — HEPARIN LEVEL (UNFRACTIONATED): Heparin Unfractionated: <0.1 IU/mL — ABNORMAL LOW (ref 0.30–0.70)

## 2013-02-07 MED ORDER — HEPARIN (PORCINE) IN NACL 100-0.45 UNIT/ML-% IJ SOLN
1300.0000 [IU]/h | INTRAMUSCULAR | Status: DC
  Administered 2013-02-07 – 2013-02-08 (×2): 1300 [IU]/h via INTRAVENOUS
  Filled 2013-02-07 (×5): qty 250

## 2013-02-07 MED ORDER — HEPARIN (PORCINE) IN NACL 100-0.45 UNIT/ML-% IJ SOLN
1100.0000 [IU]/h | INTRAMUSCULAR | Status: DC
  Administered 2013-02-07: 1100 [IU]/h via INTRAVENOUS
  Filled 2013-02-07 (×2): qty 250

## 2013-02-07 MED ORDER — HEPARIN BOLUS VIA INFUSION
2000.0000 [IU] | Freq: Once | INTRAVENOUS | Status: AC
  Administered 2013-02-07: 2000 [IU] via INTRAVENOUS
  Filled 2013-02-07: qty 2000

## 2013-02-07 NOTE — Progress Notes (Signed)
    Subjective:  Feels ok. No SOB.  Last night, agitated, HR increased briefly to 140 AFIB. Now 80's.   Objective:  Vital Signs in the last 24 hours: Temp:  [97.6 F (36.4 C)-97.9 F (36.6 C)] 97.9 F (36.6 C) (12/13 0810) Pulse Rate:  [98-141] 113 (12/13 0810) Resp:  [20-24] 21 (12/13 0810) BP: (123-135)/(73-83) 131/83 mmHg (12/13 0810) SpO2:  [93 %-99 %] 95 % (12/13 0810) Weight:  [172 lb 6.4 oz (78.2 kg)] 172 lb 6.4 oz (78.2 kg) (12/12 2044)  Intake/Output from previous day: 12/12 0701 - 12/13 0700 In: 220 [P.O.:120; IV Piggyback:100] Out: 2044    Physical Exam: General: In bed, in no acute distress. Head:  Normocephalic and atraumatic. Lungs: Clear to auscultation and percussion. Heart: Irreg irreg.  No murmur, rubs or gallops.  Abdomen: soft, non-tender, positive bowel sounds. Extremities: No clubbing or cyanosis. No edema. Neurologic: Alert but difficult to understand from speech    Lab Results:  Recent Labs  02/04/13 1242 02/06/13 0927  WBC 13.3* 15.6*  HGB 13.1 13.7  PLT 380 371    Recent Labs  02/04/13 1242 02/06/13 0927  NA 136 140  K 4.5 4.5  CL 96 98  CO2 24 24  GLUCOSE 156* 139*  BUN 46* 36*  CREATININE 6.85* 6.02*    Hepatic Function Panel  Recent Labs  02/06/13 0927  PROT 6.2  ALBUMIN 2.6*  2.6*  AST 16  ALT 11  ALKPHOS 103  BILITOT 0.9  BILIDIR 0.5*  IBILI 0.4   Telemetry: as above, AFIB Personally viewed.    Assessment/Plan:  Principal Problem:   Sepsis Active Problems:   Anemia   Hyperlipidemia   End stage renal disease   Hyperkalemia   Wound of right ankle   Open wound of left foot   SVT (supraventricular tachycardia)   Multiple rib fractures  1) AFIB - off coumadin for procedure. Vit K. Occasional RVR. Continue with amiodarone. Metop 100 BID. BP tolerating.   2) Cardiomyopathy - last ECHO with 20% EF. No current signs of overload. No ACE-I.   OK to proceed with surgery/ PV procedure. Moderate risk from  cardiac perspective. Nothing more to optimize.    SKAINS, MARK 02/07/2013, 11:35 AM

## 2013-02-07 NOTE — Progress Notes (Signed)
CSW provided pt with SNF list of bed offers and spoke with pt's son to let him know list was in the room.  CSW will continue to follow to assist with discharge planning.   [phone] (weekend CSW)

## 2013-02-07 NOTE — Progress Notes (Addendum)
[facility] Progress Note  Subjective:   Garbled speech. Partially oriented. Says his "heart hurts".  Objective Filed Vitals:   02/06/13 1642 02/06/13 2044 02/07/13 0433 02/07/13 0810  BP: 125/79 127/74 135/82 131/83  Pulse: 134 98 100 113  Temp: 97.9 F (36.6 C) 97.6 F (36.4 C) 97.8 F (36.6 C) 97.9 F (36.6 C)  TempSrc: Oral Oral Oral   Resp: 20 20 20 21   Height:      Weight:  78.2 kg (172 lb 6.4 oz)    SpO2: 96% 93% 95% 95%   Physical Exam General: Pleasantly confused, NAD Heart:Irregularly irregular, no murmur or rub appreciated Lungs: CTA bilat, no wheezes or rhonchi noted Abdomen: Soft, NT, non distended, + BS Extremities: No LE edema Dialysis Access: Aneurysmal R AVF + bruit  Outpatient dialysis schedule: MWF [facility] 3 3/4 hours 180NR BFR 450 A1.5 EDW 81 kg 3K2.25 Ca dialysate UF profile 4 Machine temp 36 Access right AVF Needle gauge 15 Meds: doxercalciferol 3 mcg (held 12/13 for ^Ca) TIW, heparin 7800 units   Assessment/Plan: 1. Sepsis  - Mgmt per primary. Multifactoral (bilateral foot wounds/ cellulitis/ UTI/ PNA) On abx On vanc/zosyn. 2. Encephalopathy -  Speech difficult to understand at times.  Normally quite the talker in op setting. Remains confused about plans/management. 3. 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 Next HD 12/15 4. Anemia - Hgb > 12 ESA on hold  5. Secondary hyperparathyroidism -  Ca 9.8 (10.9 corrected) Last PTH 308. Continue 2 Ca bath, hold hectorol for hypercalcemia;  Phosphorus elevated- binders adjusted this admit. Follow renal panel, resume Vit D as appropriate.  6. HTN/volume - SBPs 130s on metoprolol. No edema. Below edw. Lower dry at discharge. 7. Nutrition - low albumin likely a reflection of his FTT- continue renal diet, multivitamin, ONS  8. Bilateral foot cellulits/ ischemia: plans noted for arteriogram on Monday. Vit K administered/ INR now 1.80 9. A fib  with RVR - seen by cardiology and beta-blocker dosing adjusted.  On amiodarone for occasional RVR.  10. Social situation - SNF placement pending improvement of medical problems; unable to return to home at the point of being condemned; would consider evaluation for competency  Clydie Braun E. Broadus John, PA-C [facility] Pager [phone] 02/07/2013,11:28 AM  LOS: 8 days   I have seen and examined patient, discussed with PA and agree with assessment and plan as outlined above with additions as indicated. Vinson Moselle MD pager [phone]    cell [phone] 02/07/2013, 1:51 PM     Additional Objective Labs: Basic Metabolic Panel:  Recent Labs Lab 02/02/13 1430 02/03/13 1105 02/04/13 1242 02/06/13 0927  NA 136 137 136 140  K 4.9 4.6 4.5 4.5  CL 94* 94* 96 98  CO2 23 25 24 24   GLUCOSE 105* 141* 156* 139*  BUN 59* 30* 46* 36*  CREATININE 7.80* 5.19* 6.85* 6.02*  CALCIUM 9.1 9.2 9.3 9.8  PHOS 6.9*  --  6.2* 5.4*   Liver Function Tests:  Recent Labs Lab 02/02/13 1430 02/04/13 1242 02/06/13 0927  AST  --   --  16  ALT  --   --  11  ALKPHOS  --   --  103  BILITOT  --   --  0.9  PROT  --   --  6.2  ALBUMIN 2.5* 2.5* 2.6*  2.6*   CBC:  Recent Labs Lab 02/01/13 0815 02/02/13 0855 02/03/13 1108 02/04/13 1242 02/06/13  0927  WBC 14.4* 13.0* 12.9* 13.3* 15.6*  NEUTROABS  --  10.7*  --   --   --   HGB 12.5* 13.0 14.0 13.1 13.7  HCT 38.5* 40.1 43.4 40.4 42.0  MCV 96.5 95.2 96.4 95.3 96.3  PLT 246 305 322 380 371   Cardiac Enzymes:  Recent Labs Lab 02/03/13 1650 02/03/13 2240 02/04/13 0525  TROPONINI <0.30 <0.30 <0.30   CBG:  Recent Labs Lab 02/06/13 0749 02/06/13 1100 02/06/13 1640 02/06/13 2048 02/07/13 0739  GLUCAP 149* 117* 133* 196* 144*   Studies/Results: No results found.  Medications: . heparin 1,100 Units/hr (02/07/13 0944)   . amiodarone  200 mg Oral BID  . calcium acetate  2,001 mg Oral TID WC  . collagenase   Topical Daily  .  doxercalciferol  3 mcg Intravenous Q M,W,F-HD  . feeding supplement (RESOURCE BREEZE)  1 Container Oral TID WC  . insulin aspart  0-9 Units Subcutaneous TID WC  . magic mouthwash  5 mL Oral TID  . metoprolol  100 mg Oral BID  . multivitamin  1 tablet Oral QHS  . pantoprazole  40 mg Oral Daily  . piperacillin-tazobactam (ZOSYN)  IV  2.25 g Intravenous Q8H  . sodium chloride  3 mL Intravenous Q12H  . sodium chloride  3 mL Intravenous Q12H  . vancomycin  1,000 mg Intravenous Q M,W,F-HD

## 2013-02-07 NOTE — Progress Notes (Signed)
ANTICOAGULATION CONSULT NOTE - Initial Consult  Pharmacy Consult for Heparin (warfarin on hold for angiography on 12/15) Indication: atrial fibrillation  Allergies  Allergen Reactions  . Ace Inhibitors   . Lisinopril Cough   Patient Measurements: Height: 5\' 10"  (177.8 cm) Weight: 172 lb 6.4 oz (78.2 kg) IBW/kg (Calculated) : 73  Vital Signs: Temp: 97.8 F (36.6 C) (12/13 0433) Temp src: Oral (12/13 0433) BP: 135/82 mmHg (12/13 0433) Pulse Rate: 100 (12/13 0433)  Labs:  Recent Labs  02/04/13 1242  02/05/13 1200 02/06/13 0358 02/06/13 0927 02/07/13 0500  HGB 13.1  --   --   --  13.7  --   HCT 40.4  --   --   --  42.0  --   PLT 380  --   --   --  371  --   LABPROT  --   < > 36.3* 36.9*  --  20.1*  INR  --   < > 3.84* 3.92*  --  1.77*  CREATININE 6.85*  --   --   --  6.02*  --   < > = values in this interval not displayed.  Estimated Creatinine Clearance: 11.6 ml/min (by C-G formula based on Cr of 6.02).  Medical History: Past Medical History  Diagnosis Date  . Renal disorder   . Diabetes mellitus   . Hypertension   . Anemia   . Hyperlipidemia   . Thyroid disease   . Anxiety   . Skin cancer    Medications:  Last dose of warfarin 12/9, INR 1.77 this AM s/p 5mg  Vit k  Assessment: 72 y/o M with afib with warfarin on hold for angiography on 12/15 to start heparin with INR of 1.77. Noted renal dysfunction, other labs as above.   Goal of Therapy:  Heparin level 0.3-0.7 units/ml Monitor platelets by anticoagulation protocol: Yes   Plan:  -Defer BOLUS given INR -Start heparin drip at 1100 units/hr -8 hour HL at 1530 -Daily CBC/HL -Monitor for bleeding  Thank you for allowing me to take part in this patient's care,  Abran Duke, PharmD Clinical Pharmacist Phone: [phone] Pager: [phone] 02/07/2013 7:08 AM

## 2013-02-07 NOTE — Progress Notes (Signed)
ANTICOAGULATION CONSULT NOTE - Follow Up Consult  Pharmacy Consult for Heparin (warfarin on hold for angiography on 12/15) Indication: atrial fibrillation  Allergies  Allergen Reactions  . Ace Inhibitors   . Lisinopril Cough   Patient Measurements: Height: 5\' 10"  (177.8 cm) Weight: 172 lb 6.4 oz (78.2 kg) IBW/kg (Calculated) : 73  Vital Signs: Temp: 97.4 F (36.3 C) (12/13 1613) BP: 131/78 mmHg (12/13 1613) Pulse Rate: 110 (12/13 1613)  Labs:  Recent Labs  02/06/13 0358 02/06/13 0927 02/07/13 0500 02/07/13 0940 02/07/13 1645  HGB  --  13.7  --   --   --   HCT  --  42.0  --   --   --   PLT  --  371  --   --   --   LABPROT 36.9*  --  20.1* 20.4*  --   INR 3.92*  --  1.77* 1.80*  --   HEPARINUNFRC  --   --   --   --  <0.10*  CREATININE  --  6.02*  --   --   --     Estimated Creatinine Clearance: 11.6 ml/min (by C-G formula based on Cr of 6.02).  Medications:  Last dose of warfarin 12/9, INR 1.77 this AM-s/p 5mg  Vit k Heparin at 1100units/hr  Assessment: 72 y/o M with afib with warfarin on hold for angiography on 12/15 ton heparin with INR of 1.77 this morning. Did receive vitamin K, so INR is likely closer to normalized now. Noted renal dysfunction. Initial heparin level undetectable.  Goal of Therapy:  Heparin level 0.3-0.7 units/ml Monitor platelets by anticoagulation protocol: Yes   Plan:  1. Heparin bolus with 2000 units IV x1 as INR is likely not elevated 2. Increase heparin gtt rate to 1300units/hr 3. 8 hour HL 4. Daily HL and CBC 5. Continue to follow for long term plans  Denorris Reust D. Devone Bonilla, PharmD, BCPS Clinical Pharmacist Pager: [phone] 02/07/2013 5:46 PM

## 2013-02-07 NOTE — Progress Notes (Signed)
Subjective:  Patient did well ON. Continued agitation at night. HR 92-141 over the last 24 hours. Most have been 90's - 110. HR to 141 when pateint was agitated last night. Patient got Vit K per Vasc surgery. INR 1.77 today.  Objective: Vital signs in last 24 hours: Filed Vitals:   02/06/13 1253 02/06/13 1642 02/06/13 2044 02/07/13 0433  BP: 130/73 125/79 127/74 135/82  Pulse: 116 134 98 100  Temp: 97.9 F (36.6 C) 97.9 F (36.6 C) 97.6 F (36.4 C) 97.8 F (36.6 C)  TempSrc: Oral Oral Oral Oral  Resp: 24 20 20 20   Height:      Weight: 172 lb 6.4 oz (78.2 kg)  172 lb 6.4 oz (78.2 kg)   SpO2: 99% 96% 93% 95%   Weight change: -4 lb 13.6 oz (-2.199 kg)  Intake/Output Summary (Last 24 hours) at 02/07/13 0748 Last data filed at 02/07/13 0500  Gross per 24 hour  Intake    220 ml  Output   2044 ml  Net  -1824 ml   Vitals reviewed. General: resting in bed, in NAD.  HEENT: no scleral icterus Cardiac: RRR, no rubs, murmurs or gallops.  Pulm: bibasilar crackles anteriorly, scattered wheezes. No respiratory distress.  Abd: soft, nontender, nondistended, BS present Ext: warm and well perfused, trace pitting edema bilaterally. Erythema, increased warmth of the right ankle with wound in right malleolus covered with eschar. Left lateral foot with 5mm full thickeness minimum ss output.  Neuro: alert and oriented to person, year, month, and location, cranial nerves II-XII grossly intact, moves all extremities voluntarily.  Legs: Feet:Chronic wounds of the left lateral sole of foot with 5 mm full thickness breakdown and right foots lateral malleolus. No obvious bone exposure no drainage    Lab Results: Basic Metabolic Panel:  Recent Labs Lab 02/04/13 1242 02/06/13 0927  NA 136 140  K 4.5 4.5  CL 96 98  CO2 24 24  GLUCOSE 156* 139*  BUN 46* 36*  CREATININE 6.85* 6.02*  CALCIUM 9.3 9.8  PHOS 6.2* 5.4*   Liver Function Tests: CBC:  Recent Labs Lab 02/02/13 0855   02/04/13 1242 02/06/13 0927  WBC 13.0*  < > 13.3* 15.6*  NEUTROABS 10.7*  --   --   --   HGB 13.0  < > 13.1 13.7  HCT 40.1  < > 40.4 42.0  MCV 95.2  < > 95.3 96.3  PLT 305  < > 380 371  < > = values in this interval not displayed. CBG:  Recent Labs Lab 02/05/13 1645 02/05/13 2053 02/06/13 0749 02/06/13 1100 02/06/13 1640 02/06/13 2048  GLUCAP 156* 145* 149* 117* 133* 196*   Hemoglobin A1C: No results found for this basename: HGBA1C,  in the last 168 hours Coagulation:  Recent Labs Lab 02/05/13 0600 02/05/13 1200 02/06/13 0358 02/07/13 0500  LABPROT 36.7* 36.3* 36.9* 20.1*  INR 3.89* 3.84* 3.92* 1.77*    Studies/Results: No results found. Medications: I have reviewed the patient's current medications. Scheduled Meds: . amiodarone  200 mg Oral BID  . calcium acetate  2,001 mg Oral TID WC  . collagenase   Topical Daily  . doxercalciferol  3 mcg Intravenous Q M,W,F-HD  . feeding supplement (RESOURCE BREEZE)  1 Container Oral TID WC  . insulin aspart  0-9 Units Subcutaneous TID WC  . magic mouthwash  5 mL Oral TID  . metoprolol  100 mg Oral BID  . multivitamin  1 tablet Oral QHS  .  pantoprazole  40 mg Oral Daily  . piperacillin-tazobactam (ZOSYN)  IV  2.25 g Intravenous Q8H  . sodium chloride  3 mL Intravenous Q12H  . sodium chloride  3 mL Intravenous Q12H  . vancomycin  1,000 mg Intravenous Q M,W,F-HD   Continuous Infusions: . heparin     PRN Meds:.sodium chloride, acetaminophen, acetaminophen, sodium chloride Assessment/Plan: Mr. Reels is a 72 year old man with PMH of A.fib (on coumadin, BB), ESRD on HD, DM2, presenting with encephalopathy and sepsis due to bilateral foot ulcers.   Sepsis 2/2 bil foot wounds and right ankle cellulitis (Wound Bil Feet)  The patient has bilateral feet wound concerning for cellulitis and possible deep infection including OM. Given tachycardia, AMS and leukocytosis at presentation, this represented severe sepsis that has now  resolved. CXR clear aside from 4 rib fractures with no effusion, pulm edema, or consolidation concerning for PNA. Patient's neck is supple. No lateralizing neuro findings, thus meningitis is less likely. XR of Left foot and Right ankle did not rule in/out OM. He continues to improve clinically, MRI bl feet demonstrated possible OM of left 5th digit, myositis of right foot and left foot. No evidence of septic joint at this time. Diffuse cellulitis is present.  - Continue Vanc Zosyn IV per pharm  - Consulted Ortho for eval and treatment (Dr. Ranell Patrick) who rec continuation of ABX and discahrge with outpatient f/u with Dr. Lajoyce Corners. Dr. Lajoyce Corners rec Santyl and ABI with possible vasc surgery eval. Pt may require surgical debridement during hospitalization. Dr. Lajoyce Corners will continue to follow. Dr. Hart Rochester with vascular surgery plans to perform arteriography on Monday once INR is <1.6 due to concern about vascular insufficiency of the foot.  ESRD c/b Hyperkalemia - (resolved). He had missed 2 hemodialysis sessions the week prior to his presentation with K of 6.9 that has neem now stabilized after emergent HD.  - HD per nephrology, plan for another session on Monday with fistulagram.   Acute Encephalopathy  Etiology of patients acute encephalopathy is likely multifactorial. Likely causes include UTI, sepsis, bil foot infection, Uremia, and drug side effect, and baseline dementia. Patient's neck is supple. No lateralizing neuro findings, thus meningitis is less likely. Mental status has much improved thought he may have cognitive impairment at baseline. Mini Cog with normal clock face, 2/3 five minute recall on day two of admission. However, the patient continues to have intermittent episodes of confusion and agitation.  - See sepsis plan  - Patient appears to not tolerate opiates after he becomes altered with even low dose Vicodin.  Plan to use tylenol prn.  SVT (Afib with RVR)  Patients SVT is of unknown etiology (likely  afib with RVR) at this time. Patient reports not taking his rate control medications in the two days before admission. Likely he has a history of SVT with RVR that is rate controlled with an atypical regimen in metoprolol 50 mg BID and labetalol 100 mg bid. At admission, patient was given Metoprolol 50 mg BID which achieved prompt rate control. On 02/02/13 the patient refused his rate control meds. That night he became tachycardic to 140's. In the AM, he was agreeable to taking his HR meds and HR was less than 110. On 12/11 his HR continued to be >110, therefore consulted Cardiology (Dr. Mayford Knife is patients cardiologist) who increased metoprolol to 100 mg BID and rec starting amio if this does control the rate. Amio was started on 12/12. HR 90-120, mostly under 110.  Rib fractures  Multiple rib fractures. Patient is breathing comfortably. Will hold narcotics given AMS and administer tylenol prn. No signs of paradoxical chest wall movement at this time. Thus, no concern for flail chest. Of note, binders are contraindicated as they may increase risk of respiratory complications. His pain is not well controlled with Tylenol alone and he is at risk of acute delirium 2/2 pain.  -Start small dose of Vicodin -Incentive spirometry  Coumadin Treatment  - He is on coumadin Therapy for Afib as outpatient. Initially, patients INR was >10, but recheck showed INR 1.32. Thus the initial was likely a lab error. Patient was started on coumadin per pharmacy. He became therapeutic. Once, Vasc surgery recommended arteriography, coumadin was held.  Last dose was on 02/03/13. May consider reversal with Vit K as INR has continued to trend up (3.92) on 02/06/13. Vit K given on 12/12. INR 1.77 on 12/13. Likely will be under 1.6 for arteriography on Monday.  DM II  Home lantus dose is 58 U qhs. Decreased to 40 U while inpatient. SSI sensitive. CBG qhc and hs. Hg A1C of 6.5% Low blood sugar this AM in 50's. Likely due to NPO status.  As aptient has had inconsisten PO intake of food, lantus has been held.  -- SSI  SW  Patient was brought from home that is filled with trash, excrement, dirty diapers and rodents. Son feels that house needs to be condemned. Patient will likely need SNF placement for rehab.  - Consult SW  DVT PPx: Hep sq TID  Dispo: Disposition is deferred at this time, awaiting improvement of current medical problems.   The patient does have a current PCP Aida Puffer, MD) and does not need an [facility] hospital follow-up appointment after discharge.  The patient does have transportation limitations that hinder transportation to clinic appointments.  .Services Needed at time of discharge: Y = Yes, Blank = No PT: Pending eval  OT: Pending eval  RN:   Equipment: Pending eval  Other:     LOS: 8 days   Pleas Koch, MD 02/07/2013, 7:48 AM

## 2013-02-08 LAB — GLUCOSE, CAPILLARY
Glucose-Capillary: 137 mg/dL — ABNORMAL HIGH (ref 70–99)
Glucose-Capillary: 159 mg/dL — ABNORMAL HIGH (ref 70–99)
Glucose-Capillary: 139 mg/dL — ABNORMAL HIGH (ref 70–99)

## 2013-02-08 LAB — DIFFERENTIAL
Basophils Absolute: 0.1 10*3/uL (ref 0.0–0.1)
Lymphocytes Relative: 6 % — ABNORMAL LOW (ref 12–46)
Lymphs Abs: 0.9 10*3/uL (ref 0.7–4.0)
Monocytes Absolute: 1.1 10*3/uL — ABNORMAL HIGH (ref 0.1–1.0)
Neutro Abs: 12.9 10*3/uL — ABNORMAL HIGH (ref 1.7–7.7)
Neutrophils Relative %: 84 % — ABNORMAL HIGH (ref 43–77)

## 2013-02-08 LAB — CBC
HCT: 41.6 % (ref 39.0–52.0)
Hemoglobin: 13.6 g/dL (ref 13.0–17.0)
MCH: 31.9 pg (ref 26.0–34.0)
MCHC: 32.7 g/dL (ref 30.0–36.0)
MCV: 97.4 fL (ref 78.0–100.0)
Platelets: 380 10*3/uL (ref 150–400)
RBC: 4.27 MIL/uL (ref 4.22–5.81)

## 2013-02-08 LAB — BASIC METABOLIC PANEL
BUN: 29 mg/dL — ABNORMAL HIGH (ref 6–23)
CO2: 26 mEq/L (ref 19–32)
Calcium: 9.8 mg/dL (ref 8.4–10.5)
Glucose, Bld: 169 mg/dL — ABNORMAL HIGH (ref 70–99)
Potassium: 4.6 mEq/L (ref 3.5–5.1)
Sodium: 138 mEq/L (ref 135–145)

## 2013-02-08 LAB — PROTIME-INR
INR: 1.6 — ABNORMAL HIGH (ref 0.00–1.49)
Prothrombin Time: 18.6 seconds — ABNORMAL HIGH (ref 11.6–15.2)

## 2013-02-08 LAB — HEPARIN LEVEL (UNFRACTIONATED): Heparin Unfractionated: 0.39 IU/mL (ref 0.30–0.70)

## 2013-02-08 MED ORDER — VITAMIN K1 10 MG/ML IJ SOLN
1.0000 mg | Freq: Once | INTRAVENOUS | Status: AC
  Administered 2013-02-08: 1 mg via INTRAVENOUS
  Filled 2013-02-08: qty 0.1

## 2013-02-08 NOTE — Progress Notes (Signed)
   Lab Results  Component Value Date   INR 1.60* 02/08/2013   INR 1.80* 02/07/2013   INR 1.77* 02/07/2013   Another 1 mg of Vitamin K ordered.  Preop orders  Leonides Sake, MD [facility] Office: [phone] Pager: [phone]  02/08/2013, 9:05 AM

## 2013-02-08 NOTE — Progress Notes (Signed)
Subjective:  Patient agitated ON. Night nurse requested buddy belt. Has done well today without problems. Is A+O3. Excited for results of tomorrows test.  HR well controlled on new regimen.  Objective: Vital signs in last 24 hours: Filed Vitals:   02/07/13 1613 02/07/13 1952 02/08/13 0439 02/08/13 0732  BP: 131/78 136/81 134/67 117/76  Pulse: 110 103 99 100  Temp: 97.4 F (36.3 C) 96.9 F (36.1 C) 96.4 F (35.8 C) 97.6 F (36.4 C)  TempSrc:  Axillary Axillary   Resp: 22 20 22 22   Height:  5\' 10"  (1.778 m)    Weight:  171 lb 14.4 oz (77.973 kg)    SpO2: 95% 99% 97% 96%   Weight change: -4 lb 0.4 oz (-1.827 kg)  Intake/Output Summary (Last 24 hours) at 02/08/13 0818 Last data filed at 02/08/13 0700  Gross per 24 hour  Intake 767.77 ml  Output      0 ml  Net 767.77 ml   Vitals reviewed. General: resting in bed, in NAD.  HEENT: no scleral icterus Cardiac: RRR, no rubs, murmurs or gallops.  Pulm: bibasilar crackles anteriorly, scattered wheezes. No respiratory distress.  Abd: soft, nontender, nondistended, BS present Ext: warm and well perfused, trace pitting edema bilaterally. Erythema, increased warmth of the right ankle with wound in right malleolus covered with eschar. Left lateral foot with 5mm full thickeness minimum ss output.  Neuro: alert and oriented to person, year, month, and location, cranial nerves II-XII grossly intact, moves all extremities voluntarily.  Legs: Feet:Chronic wounds of the left lateral sole of foot with 5 mm full thickness breakdown and right foots lateral malleolus. No obvious bone exposure no drainage    Lab Results: Basic Metabolic Panel:  Recent Labs Lab 02/04/13 1242 02/06/13 0927 02/08/13 0207  NA 136 140 138  K 4.5 4.5 4.6  CL 96 98 98  CO2 24 24 26   GLUCOSE 156* 139* 169*  BUN 46* 36* 29*  CREATININE 6.85* 6.02* 5.28*  CALCIUM 9.3 9.8 9.8  PHOS 6.2* 5.4*  --    Liver Function Tests: CBC:  Recent Labs Lab  02/02/13 0855  02/06/13 0927 02/08/13 0207  WBC 13.0*  < > 15.6* 15.3*  NEUTROABS 10.7*  --   --  12.9*  HGB 13.0  < > 13.7 13.6  HCT 40.1  < > 42.0 41.6  MCV 95.2  < > 96.3 97.4  PLT 305  < > 371 380  < > = values in this interval not displayed. CBG:  Recent Labs Lab 02/06/13 1640 02/06/13 2048 02/07/13 0739 02/07/13 1139 02/07/13 1609 02/07/13 2128  GLUCAP 133* 196* 144* 148* 176* 170*   Hemoglobin A1C: No results found for this basename: HGBA1C,  in the last 168 hours Coagulation:  Recent Labs Lab 02/06/13 0358 02/07/13 0500 02/07/13 0940 02/08/13 0207  LABPROT 36.9* 20.1* 20.4* 18.6*  INR 3.92* 1.77* 1.80* 1.60*    Studies/Results: No results found. Medications: I have reviewed the patient's current medications. Scheduled Meds: . amiodarone  200 mg Oral BID  . calcium acetate  2,001 mg Oral TID WC  . collagenase   Topical Daily  . feeding supplement (RESOURCE BREEZE)  1 Container Oral TID WC  . insulin aspart  0-9 Units Subcutaneous TID WC  . magic mouthwash  5 mL Oral TID  . metoprolol  100 mg Oral BID  . multivitamin  1 tablet Oral QHS  . pantoprazole  40 mg Oral Daily  . piperacillin-tazobactam (ZOSYN)  IV  2.25 g Intravenous Q8H  . sodium chloride  3 mL Intravenous Q12H  . sodium chloride  3 mL Intravenous Q12H  . vancomycin  1,000 mg Intravenous Q M,W,F-HD   Continuous Infusions: . heparin 1,300 Units/hr (02/08/13 0717)   PRN Meds:.sodium chloride, acetaminophen, acetaminophen, sodium chloride Assessment/Plan: Chris Vance is a 72 year old man with PMH of A.fib (on coumadin, BB), ESRD on HD, DM2, presenting with encephalopathy and sepsis due to bilateral foot ulcers.   Sepsis 2/2 bil foot wounds and right ankle cellulitis (Wound Bil Feet)  The patient has bilateral feet wound concerning for cellulitis and possible deep infection including OM. Given tachycardia, AMS and leukocytosis at presentation, this represented severe sepsis that has now  resolved. CXR clear aside from 4 rib fractures with no effusion, pulm edema, or consolidation concerning for PNA. Patient's neck is supple. No lateralizing neuro findings, thus meningitis is less likely. XR of Left foot and Right ankle did not rule in/out OM. He continues to improve clinically, MRI bl feet demonstrated possible OM of left 5th digit, myositis of right foot and left foot. No evidence of septic joint at this time. Diffuse cellulitis is present.  - Continue Vanc Zosyn IV per pharm  - Consulted Ortho for eval and treatment (Dr. Ranell Patrick) who rec continuation of ABX and discahrge with outpatient f/u with Dr. Lajoyce Corners. Dr. Lajoyce Corners rec Santyl and ABI with possible vasc surgery eval. Pt may require surgical debridement during hospitalization. Dr. Lajoyce Corners will continue to follow. Dr. Hart Rochester with vascular surgery plans to perform arteriography on Monday once INR is <1.6 due to concern about vascular insufficiency of the foot.  ESRD c/b Hyperkalemia - (resolved). He had missed 2 hemodialysis sessions the week prior to his presentation with K of 6.9 that has neem now stabilized after emergent HD.  - HD per nephrology, plan for another session on Monday with fistulagram.   Acute Encephalopathy  Etiology of patients acute encephalopathy is likely multifactorial. Likely causes include UTI, sepsis, bil foot infection, Uremia, and drug side effect, and baseline dementia. Patient's neck is supple. No lateralizing neuro findings, thus meningitis is less likely. Mental status has much improved thought he may have cognitive impairment at baseline. Mini Cog with normal clock face, 2/3 five minute recall on day two of admission. However, the patient continues to have intermittent episodes of confusion and agitation.  - See sepsis plan  - Patient appears to not tolerate opiates after he becomes altered with even low dose Vicodin.  Plan to use tylenol prn.  SVT (Afib with RVR)  Patients SVT is of unknown etiology (likely  afib with RVR) at this time. Patient reports not taking his rate control medications in the two days before admission. Likely he has a history of SVT with RVR that is rate controlled with an atypical regimen in metoprolol 50 mg BID and labetalol 100 mg bid. At admission, patient was given Metoprolol 50 mg BID which achieved prompt rate control. On 02/02/13 the patient refused his rate control meds. That night he became tachycardic to 140's. In the AM, he was agreeable to taking his HR meds and HR was less than 110. On 12/11 his HR continued to be >110, therefore consulted Cardiology (Dr. Mayford Knife is patients cardiologist) who increased metoprolol to 100 mg BID and rec starting amio if this does control the rate. Amio was started on 12/12. HR 90-120, mostly under 110.  Rib fractures  Multiple rib fractures. Patient is breathing comfortably. Will hold narcotics given  AMS and administer tylenol prn. No signs of paradoxical chest wall movement at this time. Thus, no concern for flail chest. Of note, binders are contraindicated as they may increase risk of respiratory complications. His pain is not well controlled with Tylenol alone and he is at risk of acute delirium 2/2 pain.  -Start small dose of Vicodin -Incentive spirometry  Coumadin Treatment  - He is on coumadin Therapy for Afib as outpatient. Initially, patients INR was >10, but recheck showed INR 1.32. Thus the initial was likely a lab error. Patient was started on coumadin per pharmacy. He became therapeutic. Once, Vasc surgery recommended arteriography, coumadin was held.  Last dose was on 02/03/13. May consider reversal with Vit K as INR has continued to trend up (3.92) on 02/06/13. Vit K given on 12/12. INR 1.77 on 12/13. Likely will be under 1.6 for arteriography on Monday.  DM II  Home lantus dose is 58 U qhs. Decreased to 40 U while inpatient. SSI sensitive. CBG qhc and hs. Hg A1C of 6.5% Low blood sugar this AM in 50's. Likely due to NPO status.  As aptient has had inconsisten PO intake of food, lantus has been held.  -- SSI  SW  Patient was brought from home that is filled with trash, excrement, dirty diapers and rodents. Son feels that house needs to be condemned. Patient will likely need SNF placement for rehab.  - Consult SW  DVT PPx: Hep sq TID  Dispo: Disposition is deferred at this time, awaiting improvement of current medical problems.   The patient does have a current PCP Aida Puffer, MD) and does not need an [facility] hospital follow-up appointment after discharge.  The patient does have transportation limitations that hinder transportation to clinic appointments.  .Services Needed at time of discharge: Y = Yes, Blank = No PT: Pending eval  OT: Pending eval  RN:   Equipment: Pending eval  Other:     LOS: 9 days   Pleas Koch, MD 02/08/2013, 8:18 AM

## 2013-02-08 NOTE — Progress Notes (Signed)
ANTICOAGULATION CONSULT NOTE - Follow Up Consult  Pharmacy Consult for Heparin Indication: atrial fibrillation  Allergies  Allergen Reactions  . Ace Inhibitors   . Lisinopril Cough    Patient Measurements: Height: 5\' 10"  (177.8 cm) Weight: 171 lb 14.4 oz (77.973 kg) IBW/kg (Calculated) : 73 Heparin Dosing Weight:   Vital Signs: Temp: 97.4 F (36.3 C) (12/14 1330) Temp src: Axillary (12/14 1330) BP: 126/63 mmHg (12/14 1330) Pulse Rate: 100 (12/14 1330)  Labs:  Recent Labs  02/06/13 0927  02/07/13 0940  02/08/13 0207 02/08/13 1020 02/08/13 1500  HGB 13.7  --   --   --  13.6  --   --   HCT 42.0  --   --   --  41.6  --   --   PLT 371  --   --   --  380  --   --   LABPROT  --   < > 20.4*  --  18.6*  --  17.8*  INR  --   < > 1.80*  --  1.60*  --  1.51*  HEPARINUNFRC  --   --   --   < > 0.36 0.24* 0.39  CREATININE 6.02*  --   --   --  5.28*  --   --   < > = values in this interval not displayed.  Estimated Creatinine Clearance: 13.2 ml/min (by C-G formula based on Cr of 5.28).   Medications:  Scheduled:  . amiodarone  200 mg Oral BID  . calcium acetate  2,001 mg Oral TID WC  . collagenase   Topical Daily  . feeding supplement (RESOURCE BREEZE)  1 Container Oral TID WC  . insulin aspart  0-9 Units Subcutaneous TID WC  . magic mouthwash  5 mL Oral TID  . metoprolol  100 mg Oral BID  . multivitamin  1 tablet Oral QHS  . pantoprazole  40 mg Oral Daily  . piperacillin-tazobactam (ZOSYN)  IV  2.25 g Intravenous Q8H  . sodium chloride  3 mL Intravenous Q12H  . sodium chloride  3 mL Intravenous Q12H  . vancomycin  1,000 mg Intravenous Q M,W,F-HD    Assessment: 72yo ESRD male with AFib, on Heparin bridge while Coumadin on hold for angiogram.  Heparin level 0.24 this AM- down from goal range.  Hg and pltc are stable and wnl.  No bleeding problems noted.   Spoke with RN, who stated that pt had pulled IV out during night & she had been told that heparin was off x ~2hr.   Expect this is reflected in decrease level  INR 1.6- repeating Vit K 1mg  IV in prep for angiogram.    F/u heparin level this afternoon therapeutic on heparin 1300 units/hr.  Goal of Therapy:  Heparin level 0.3-0.7 units/ml Monitor platelets by anticoagulation protocol: Yes   Plan:  1. Continue IV heparin at current rate of 1300 units/hr. 2. Heparin level and CBC with AM labs. 3. Will monitor for bleeding.   Tad Moore, BCPS  Clinical Pharmacist Pager [phone]  02/08/2013 4:03 PM

## 2013-02-08 NOTE — Progress Notes (Signed)
[facility] Progress Note  Subjective:   No complaints today but still confused. Says he's going home. Thinks he's in a chair although resting in bed.  Objective Filed Vitals:   02/07/13 1613 02/07/13 1952 02/08/13 0439 02/08/13 0732  BP: 131/78 136/81 134/67 117/76  Pulse: 110 103 99 100  Temp: 97.4 F (36.3 C) 96.9 F (36.1 C) 96.4 F (35.8 C) 97.6 F (36.4 C)  TempSrc:  Axillary Axillary   Resp: 22 20 22 22   Height:  5\' 10"  (1.778 m)    Weight:  77.973 kg (171 lb 14.4 oz)    SpO2: 95% 99% 97% 96%   Physical Exam General: Pleasantly confused, NAD Heart: irreg irreg,  Lungs: CTA bilat, no wheezes or rhonchi Abdomen: Soft, NT, non-distended, + BS Extremities: No LE edema Dialysis Access: R AVF + bruit  Outpatient dialysis schedule: MWF [facility] 3 3/4 hours 180NR BFR 450 A1.5 EDW 81 kg  3K2.25 Ca dialysate UF profile 4 Machine temp 36 Access right AVF Needle gauge 15 Meds: doxercalciferol 3 mcg (held 12/13 for ^Ca)  TIW, heparin 7800 units    Assessment/Plan: 1. 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 Encephalopathy: mild, no change 3. 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 Next HD 12/15. No heparin for Mon due to gtt and procedure. 4. Anemia - Hgb > 12 ESA on hold  5. Secondary hyperparathyroidism - Ca 9.8 (10.9 corrected) Last PTH 308. Continue 2 Ca bath, hold hectorol for hypercalcemia; Phosphorus elevated- binders adjusted this admit. Follow labs, resume Vit D as appropriate.  6. HTN/volume - SBPs 110s on metoprolol. No edema. Below edw. Lower dry at discharge. 7. Nutrition - continue renal diet, multivitamin, ONS  8. Bilateral foot cellulits/ ischemia: plans noted for arteriogram on Monday. Vit K administered/ INR now 1.6 9. A fib with RVR - seen by cardiology and beta-blocker dosing adjusted. On amiodarone for  occasional RVR.  10. Social situation - SNF placement pending improvement of medical problems; unable to return to home at the point of being condemned; Son has been provided a list of bed options per SW. Appreciate assistance. Would consider evaluation for competency.  Scot Jun. Thad Ranger [facility] Pager [phone] 02/08/2013,11:08 AM  LOS: 9 days   I have seen and examined patient, discussed with PA and agree with assessment and plan as outlined above. Vinson Moselle MD pager [phone]    cell [phone] 02/08/2013, 1:56 PM     Additional Objective Labs: Basic Metabolic Panel:  Recent Labs Lab 02/02/13 1430  02/04/13 1242 02/06/13 0927 02/08/13 0207  NA 136  < > 136 140 138  K 4.9  < > 4.5 4.5 4.6  CL 94*  < > 96 98 98  CO2 23  < > 24 24 26   GLUCOSE 105*  < > 156* 139* 169*  BUN 59*  < > 46* 36* 29*  CREATININE 7.80*  < > 6.85* 6.02* 5.28*  CALCIUM 9.1  < > 9.3 9.8 9.8  PHOS 6.9*  --  6.2* 5.4*  --   < > = values in this interval not displayed. Liver Function Tests:  Recent Labs Lab 02/02/13 1430 02/04/13 1242 02/06/13 0927  AST  --   --  16  ALT  --   --  11  ALKPHOS  --   --  103  BILITOT  --   --  0.9  PROT  --   --  6.2  ALBUMIN 2.5* 2.5* 2.6*  2.6*   CBC:  Recent Labs Lab 02/02/13 0855 02/03/13 1108 02/04/13 1242 02/06/13 0927 02/08/13 0207  WBC 13.0* 12.9* 13.3* 15.6* 15.3*  NEUTROABS 10.7*  --   --   --  12.9*  HGB 13.0 14.0 13.1 13.7 13.6  HCT 40.1 43.4 40.4 42.0 41.6  MCV 95.2 96.4 95.3 96.3 97.4  PLT 305 322 380 371 380    Cardiac Enzymes:  Recent Labs Lab 02/03/13 1650 02/03/13 2240 02/04/13 0525  TROPONINI <0.30 <0.30 <0.30   CBG:  Recent Labs Lab 02/07/13 0739 02/07/13 1139 02/07/13 1609 02/07/13 2128 02/08/13 0724  GLUCAP 144* 148* 176* 170* 153*   Studies/Results: No results found. Medications: . heparin 1,300 Units/hr (02/08/13 0717)   . amiodarone  200 mg Oral BID  . calcium acetate  2,001  mg Oral TID WC  . collagenase   Topical Daily  . feeding supplement (RESOURCE BREEZE)  1 Container Oral TID WC  . insulin aspart  0-9 Units Subcutaneous TID WC  . magic mouthwash  5 mL Oral TID  . metoprolol  100 mg Oral BID  . multivitamin  1 tablet Oral QHS  . pantoprazole  40 mg Oral Daily  . phytonadione (VITAMIN K) IV  1 mg Intravenous Once  . piperacillin-tazobactam (ZOSYN)  IV  2.25 g Intravenous Q8H  . sodium chloride  3 mL Intravenous Q12H  . sodium chloride  3 mL Intravenous Q12H  . vancomycin  1,000 mg Intravenous Q M,W,F-HD

## 2013-02-08 NOTE — Progress Notes (Addendum)
ANTICOAGULATION CONSULT NOTE - Follow Up Consult  Pharmacy Consult for Heparin Indication: atrial fibrillation  Allergies  Allergen Reactions  . Ace Inhibitors   . Lisinopril Cough    Patient Measurements: Height: 5\' 10"  (177.8 cm) Weight: 171 lb 14.4 oz (77.973 kg) IBW/kg (Calculated) : 73 Heparin Dosing Weight:   Vital Signs: Temp: 97.6 F (36.4 C) (12/14 0732) Temp src: Axillary (12/14 0439) BP: 117/76 mmHg (12/14 0732) Pulse Rate: 100 (12/14 0732)  Labs:  Recent Labs  02/06/13 0927 02/07/13 0500 02/07/13 0940 02/07/13 1645 02/08/13 0207 02/08/13 1020  HGB 13.7  --   --   --  13.6  --   HCT 42.0  --   --   --  41.6  --   PLT 371  --   --   --  380  --   LABPROT  --  20.1* 20.4*  --  18.6*  --   INR  --  1.77* 1.80*  --  1.60*  --   HEPARINUNFRC  --   --   --  <0.10* 0.36 0.24*  CREATININE 6.02*  --   --   --  5.28*  --     Estimated Creatinine Clearance: 13.2 ml/min (by C-G formula based on Cr of 5.28).   Medications:  Scheduled:  . amiodarone  200 mg Oral BID  . calcium acetate  2,001 mg Oral TID WC  . collagenase   Topical Daily  . feeding supplement (RESOURCE BREEZE)  1 Container Oral TID WC  . insulin aspart  0-9 Units Subcutaneous TID WC  . magic mouthwash  5 mL Oral TID  . metoprolol  100 mg Oral BID  . multivitamin  1 tablet Oral QHS  . pantoprazole  40 mg Oral Daily  . phytonadione (VITAMIN K) IV  1 mg Intravenous Once  . piperacillin-tazobactam (ZOSYN)  IV  2.25 g Intravenous Q8H  . sodium chloride  3 mL Intravenous Q12H  . sodium chloride  3 mL Intravenous Q12H  . vancomycin  1,000 mg Intravenous Q M,W,F-HD    Assessment: 72yo ESRD male with AFib, on Heparin bridge while Coumadin on hold for angiogram.  Heparin level 0.24 this AM- down from goal range.  Hg and pltc are stable and wnl.  No bleeding problems noted.   Spoke with RN, who stated that pt had pulled IV out during night & she had been told that heparin was off x ~2hr.  Expect this  is reflected in decrease level  INR 1.6- repeating Vit K 1mg  IV in prep for angiogram.    Goal of Therapy:  Heparin level 0.3-0.7 units/ml Monitor platelets by anticoagulation protocol: Yes   Plan:  Repeat HL at 1500.  Marisue Humble, PharmD Clinical Pharmacist [facility]

## 2013-02-08 NOTE — Progress Notes (Signed)
ANTICOAGULATION CONSULT NOTE - Initial Consult  Pharmacy Consult for Heparin (warfarin on hold for angiography on 12/15) Indication: atrial fibrillation  Allergies  Allergen Reactions  . Ace Inhibitors   . Lisinopril Cough   Patient Measurements: Height: 5\' 10"  (177.8 cm) Weight: 171 lb 14.4 oz (77.973 kg) IBW/kg (Calculated) : 73  Vital Signs: Temp: 96.9 F (36.1 C) (12/13 1952) Temp src: Axillary (12/13 1952) BP: 136/81 mmHg (12/13 1952) Pulse Rate: 103 (12/13 1952)  Labs:  Recent Labs  02/06/13 0927 02/07/13 0500 02/07/13 0940 02/07/13 1645 02/08/13 0207  HGB 13.7  --   --   --  13.6  HCT 42.0  --   --   --  41.6  PLT 371  --   --   --  380  LABPROT  --  20.1* 20.4*  --  18.6*  INR  --  1.77* 1.80*  --  1.60*  HEPARINUNFRC  --   --   --  <0.10* 0.36  CREATININE 6.02*  --   --   --  5.28*    Estimated Creatinine Clearance: 13.2 ml/min (by C-G formula based on Cr of 5.28).  Medical History: Past Medical History  Diagnosis Date  . Renal disorder   . Diabetes mellitus   . Hypertension   . Anemia   . Hyperlipidemia   . Thyroid disease   . Anxiety   . Skin cancer    Medications:  Heparin 1300 units/hr  Assessment: 72 y/o M with afib with warfarin on hold for angiography on 12/15 on heparin. HL is now 0.36 after rate increase.   Goal of Therapy:  Heparin level 0.3-0.7 units/ml Monitor platelets by anticoagulation protocol: Yes   Plan:  -Continue heparin drip at 1300 units/hr -8 hour HL at 1100 -Daily CBC/HL -Monitor for bleeding  Thank you for allowing me to take part in this patient's care,  Abran Duke, PharmD Clinical Pharmacist Phone: [phone] Pager: [phone] 02/08/2013 3:10 AM

## 2013-02-08 NOTE — Progress Notes (Signed)
     Subjective:  Sleepy, confused.   Objective:  Vital Signs in the last 24 hours: Temp:  [96.4 F (35.8 C)-97.6 F (36.4 C)] 96.6 F (35.9 C) (12/14 1636) Pulse Rate:  [95-103] 95 (12/14 1636) Resp:  [18-22] 18 (12/14 1636) BP: (117-136)/(63-81) 129/64 mmHg (12/14 1636) SpO2:  [96 %-99 %] 99 % (12/14 1636) Weight:  [171 lb 14.4 oz (77.973 kg)] 171 lb 14.4 oz (77.973 kg) (12/13 1952)  Intake/Output from previous day: 12/13 0701 - 12/14 0700 In: 787.8 [P.O.:380; I.V.:257.8; IV Piggyback:150] Out: -    Physical Exam: General: In bed, in no acute distress. Head:  Normocephalic and atraumatic. Lungs: Clear to auscultation and percussion. Heart: Irreg irreg.  No murmur, rubs or gallops.  Abdomen: soft, non-tender, positive bowel sounds. Extremities: No clubbing or cyanosis. No edema. Neurologic: Alert but difficult to understand from speech    Lab Results:  Recent Labs  02/06/13 0927 02/08/13 0207  WBC 15.6* 15.3*  HGB 13.7 13.6  PLT 371 380    Recent Labs  02/06/13 0927 02/08/13 0207  NA 140 138  K 4.5 4.6  CL 98 98  CO2 24 26  GLUCOSE 139* 169*  BUN 36* 29*  CREATININE 6.02* 5.28*    Hepatic Function Panel  Recent Labs  02/06/13 0927  PROT 6.2  ALBUMIN 2.6*  2.6*  AST 16  ALT 11  ALKPHOS 103  BILITOT 0.9  BILIDIR 0.5*  IBILI 0.4   Telemetry: as above, AFIB Personally viewed.    Assessment/Plan:  Principal Problem:   Sepsis Active Problems:   Anemia   Hyperlipidemia   End stage renal disease   Hyperkalemia   Wound of right ankle   Open wound of left foot   SVT (supraventricular tachycardia)   Multiple rib fractures  1) AFIB - off coumadin for procedure. Vit K. Occasional RVR. Continue with amiodarone. Metop 100 BID. BP tolerating.   2) Cardiomyopathy - last ECHO with 20% EF. No current signs of overload. No ACE-I.   OK to proceed with surgery/ PV procedure. Moderate risk from cardiac perspective. Nothing more to optimize.     Rojelio Uhrich 02/08/2013, 4:57 PM

## 2013-02-09 ENCOUNTER — Ambulatory Visit (HOSPITAL_COMMUNITY): Admit: 2013-02-09 | Payer: Medicare Other | Admitting: Vascular Surgery

## 2013-02-09 ENCOUNTER — Encounter (HOSPITAL_COMMUNITY)
Admission: EM | Disposition: A | Discharge status: Skilled Nursing Facility | Payer: Self-pay | Source: Home / Self Care | Attending: Internal Medicine

## 2013-02-09 DIAGNOSIS — Z01810 Encounter for preprocedural cardiovascular examination: Secondary | ICD-10-CM

## 2013-02-09 DIAGNOSIS — I739 Peripheral vascular disease, unspecified: Secondary | ICD-10-CM

## 2013-02-09 DIAGNOSIS — E119 Type 2 diabetes mellitus without complications: Secondary | ICD-10-CM

## 2013-02-09 DIAGNOSIS — L97509 Non-pressure chronic ulcer of other part of unspecified foot with unspecified severity: Secondary | ICD-10-CM

## 2013-02-09 DIAGNOSIS — E875 Hyperkalemia: Secondary | ICD-10-CM

## 2013-02-09 DIAGNOSIS — L98499 Non-pressure chronic ulcer of skin of other sites with unspecified severity: Secondary | ICD-10-CM

## 2013-02-09 HISTORY — PX: ABDOMINAL AORTAGRAM: SHX5454

## 2013-02-09 LAB — CBC WITH DIFFERENTIAL/PLATELET
Basophils Absolute: 0.1 10*3/uL (ref 0.0–0.1)
Basophils Relative: 1 % (ref 0–1)
Eosinophils Absolute: 0.5 10*3/uL (ref 0.0–0.7)
Eosinophils Relative: 4 % (ref 0–5)
MCH: 31.7 pg (ref 26.0–34.0)
MCHC: 32.3 g/dL (ref 30.0–36.0)
Monocytes Relative: 7 % (ref 3–12)
Neutro Abs: 10.8 10*3/uL — ABNORMAL HIGH (ref 1.7–7.7)
Neutrophils Relative %: 82 % — ABNORMAL HIGH (ref 43–77)
RBC: 4.39 MIL/uL (ref 4.22–5.81)
RDW: 17.8 % — ABNORMAL HIGH (ref 11.5–15.5)

## 2013-02-09 LAB — VANCOMYCIN, RANDOM: Vancomycin Rm: 16.9 ug/mL

## 2013-02-09 LAB — CREATININE, SERUM
Creatinine, Ser: 3.57 mg/dL — ABNORMAL HIGH (ref 0.50–1.35)
GFR calc Af Amer: 18 mL/min — ABNORMAL LOW (ref >90)

## 2013-02-09 LAB — BASIC METABOLIC PANEL
BUN: 37 mg/dL — ABNORMAL HIGH (ref 6–23)
CO2: 25 mEq/L (ref 19–32)
Chloride: 96 mEq/L (ref 96–112)
Creatinine, Ser: 6.77 mg/dL — ABNORMAL HIGH (ref 0.50–1.35)
GFR calc Af Amer: 8 mL/min — ABNORMAL LOW (ref >90)
GFR calc non Af Amer: 7 mL/min — ABNORMAL LOW (ref >90)
Glucose, Bld: 149 mg/dL — ABNORMAL HIGH (ref 70–99)
Potassium: 4.3 mEq/L (ref 3.5–5.1)

## 2013-02-09 LAB — CBC
HCT: 40.9 % (ref 39.0–52.0)
Hemoglobin: 13.3 g/dL (ref 13.0–17.0)
MCH: 31.7 pg (ref 26.0–34.0)
MCHC: 32.5 g/dL (ref 30.0–36.0)
MCV: 97.4 fL (ref 78.0–100.0)
Platelets: 256 K/uL (ref 150–400)
RBC: 4.2 MIL/uL — ABNORMAL LOW (ref 4.22–5.81)
RDW: 18 % — ABNORMAL HIGH (ref 11.5–15.5)
WBC: 12.2 K/uL — ABNORMAL HIGH (ref 4.0–10.5)

## 2013-02-09 LAB — GLUCOSE, CAPILLARY
Glucose-Capillary: 153 mg/dL — ABNORMAL HIGH (ref 70–99)
Glucose-Capillary: 125 mg/dL — ABNORMAL HIGH (ref 70–99)
Glucose-Capillary: 159 mg/dL — ABNORMAL HIGH (ref 70–99)

## 2013-02-09 LAB — HEPARIN LEVEL (UNFRACTIONATED): Heparin Unfractionated: 0.36 IU/mL (ref 0.30–0.70)

## 2013-02-09 LAB — PHOSPHORUS: Phosphorus: 4.9 mg/dL — ABNORMAL HIGH (ref 2.3–4.6)

## 2013-02-09 LAB — PROTIME-INR
INR: 1.49 (ref 0.00–1.49)
Prothrombin Time: 17.6 seconds — ABNORMAL HIGH (ref 11.6–15.2)

## 2013-02-09 SURGERY — ABDOMINAL AORTAGRAM
Anesthesia: LOCAL

## 2013-02-09 MED ORDER — PIPERACILLIN-TAZOBACTAM IN DEX 2-0.25 GM/50ML IV SOLN
2.2500 g | Freq: Three times a day (TID) | INTRAVENOUS | Status: DC
  Administered 2013-02-09 – 2013-02-16 (×20): 2.25 g via INTRAVENOUS
  Filled 2013-02-09 (×23): qty 50

## 2013-02-09 MED ORDER — FENTANYL CITRATE 0.05 MG/ML IJ SOLN
INTRAMUSCULAR | Status: AC
  Filled 2013-02-09: qty 2

## 2013-02-09 MED ORDER — ONDANSETRON HCL 4 MG/2ML IJ SOLN: 4.0000 mg | Freq: Four times a day (QID) | INTRAMUSCULAR | Status: DC | PRN

## 2013-02-09 MED ORDER — LIDOCAINE HCL (PF) 1 % IJ SOLN
INTRAMUSCULAR | Status: AC
  Filled 2013-02-09: qty 30

## 2013-02-09 MED ORDER — HEPARIN (PORCINE) IN NACL 2-0.9 UNIT/ML-% IJ SOLN
INTRAMUSCULAR | Status: AC
  Filled 2013-02-09: qty 1000

## 2013-02-09 MED ORDER — HEPARIN (PORCINE) IN NACL 100-0.45 UNIT/ML-% IJ SOLN
1300.0000 [IU]/h | INTRAMUSCULAR | Status: DC
  Administered 2013-02-09 – 2013-02-11 (×4): 1300 [IU]/h via INTRAVENOUS
  Filled 2013-02-09 (×8): qty 250

## 2013-02-09 MED ORDER — METOPROLOL TARTRATE 100 MG PO TABS
100.0000 mg | ORAL_TABLET | Freq: Two times a day (BID) | ORAL | Status: DC
  Administered 2013-02-09 – 2013-02-15 (×11): 100 mg via ORAL
  Filled 2013-02-09 (×16): qty 1

## 2013-02-09 NOTE — Progress Notes (Signed)
Went to [facility] lab.  Nothing new to report.

## 2013-02-09 NOTE — Progress Notes (Signed)
ANTICOAGULATION CONSULT NOTE - Follow Up Consult  Pharmacy Consult for Heparin Indication: atrial fibrillation  Allergies  Allergen Reactions  . Ace Inhibitors   . Lisinopril Cough    Patient Measurements: Height: 5\' 10"  (177.8 cm) Weight: 176 lb 9.4 oz (80.1 kg) IBW/kg (Calculated) : 73 Heparin Dosing Weight:   Vital Signs: Temp: 97.1 F (36.2 C) (12/15 1200) Temp src: Oral (12/15 1200) BP: 147/70 mmHg (12/15 1530) Pulse Rate: 90 (12/15 1530)  Labs:  Recent Labs  02/08/13 0207 02/08/13 1020 02/08/13 1500 02/09/13 0555  HGB 13.6  --   --  13.9  HCT 41.6  --   --  43.0  PLT 380  --   --  323  LABPROT 18.6*  --  17.8* 17.6*  INR 1.60*  --  1.51* 1.49  HEPARINUNFRC 0.36 0.24* 0.39 0.36  CREATININE 5.28*  --   --  6.77*    Estimated Creatinine Clearance: 10.3 ml/min (by C-G formula based on Cr of 6.77).   Medications:  Scheduled:  . amiodarone  200 mg Oral BID  . calcium acetate  2,001 mg Oral TID WC  . collagenase   Topical Daily  . feeding supplement (RESOURCE BREEZE)  1 Container Oral TID WC  . insulin aspart  0-9 Units Subcutaneous TID WC  . magic mouthwash  5 mL Oral TID  . metoprolol  100 mg Oral BID  . multivitamin  1 tablet Oral QHS  . pantoprazole  40 mg Oral Daily  . piperacillin-tazobactam (ZOSYN)  IV  2.25 g Intravenous Q8H  . sodium chloride  3 mL Intravenous Q12H  . sodium chloride  3 mL Intravenous Q12H  . vancomycin  1,000 mg Intravenous Q M,W,F-HD    Assessment: 72yo ESRD male with AFib, on Heparin bridge while Coumadin on hold for angiogram today. Heparin level 0.36 this AM on 1300 units/hr. Heparin is currently off after arteriogram, pt. Is currently having HD. Plan for fem-pop bypass graft on 12/19. Spoke with primary team, will resume heparin this evening, and continue holding coumadin. INR 1.49 today, no bleeding problems noted.   Goal of Therapy:  Heparin level 0.3-0.7 units/ml Monitor platelets by anticoagulation protocol: Yes    Plan:  1. Resume IV heparin at 1300 units/hr after HD at 1800 2. Heparin level at 0200 3. Will monitor for bleeding. 4. F/u plans for resuming coumadin  Bayard Hugger, PharmD, BCPS  Clinical Pharmacist  Pager: [phone]    02/09/2013 3:58 PM

## 2013-02-09 NOTE — Progress Notes (Signed)
[facility] Progress Note  Subjective:   Back from arteriogram; not sure what they are going to do about his foot  Objective Filed Vitals:   02/08/13 1330 02/08/13 1636 02/08/13 2042 02/09/13 0438  BP: 126/63 129/64 122/68 135/65  Pulse: 100 95 111 65  Temp: 97.4 F (36.3 C) 96.6 F (35.9 C) 96.7 F (35.9 C) 97 F (36.1 C)  TempSrc: Axillary Axillary Axillary Axillary  Resp: 21 18 20 20   Height:   5\' 10"  (1.778 m)   Weight:   78.881 kg (173 lb 14.4 oz)   SpO2: 98% 99% 100% 99%   Physical Exam General: supine breathing easily'; near tearful at times when talking Heart: irreg irreg Lungs: no wheezes or rales Abdomen: soft NT Extremities: no sig edema Dialysis Access: R AVF + bruit   Outpatient dialysis schedule: MWF [facility] 3 3/4 hours 180NR BFR 450 A1.5 EDW 81 kg  3K2.25 Ca dialysate UF profile 4 Machine temp 36 Access right AVF Needle gauge 15 Meds: doxercalciferol 3 mcg (held 12/13 for ^Ca) TIW, heparin 7800 units   Assessment/Plan:  1. Bilateral foot wounds / cellulitis / myositis: on vanc/zosyn, for LE angiogram today - see Dr. Adele Dan op note for findings 2. Encephalopathy: mild, no change; near tearfulness related to anxiety about not knowing what is going to happen with his leg/foot 3. 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 Next HD 12/15. No heparin for Mon due to heparin gtt and procedure today K 4.3 4. Anemia - Hgb > 13 ESA on hold  5. Secondary hyperparathyroidism - Ca 9.8 (10.9 corrected) Last PTH 308. Continue 2 Ca bath, hold hectorol for hypercalcemia; Phosphorus elevated- binders adjusted this admit. Follow labs, resume Vit D as appropriate.  6. HTN/volume - SBPs 110s on metoprolol. No edema. Below edw. Lower dry at discharge. 7. Nutrition - continue renal diet, multivitamin, ONS  8. Bilateral foot cellulits/ ischemia: plans noted for arteriogram on Monday. Vit K administered/ INR now  1.49 9. A fib with RVR - seen by cardiology and beta-blocker dosing adjusted. On amiodarone for occasional RVR.  10. Social situation - SNF placement pending improvement of medical problems; unable to return to home at the point of being condemned; Son has been provided a list of bed options per SW. Appreciate assistance. Would consider evaluation for competency.   Sheffield Slider, PA-C [facility] Beeper [phone] 02/09/2013,8:59 AM  LOS: 10 days   I have seen and examined patient, discussed with PA and agree with assessment and plan as outlined above. Vinson Moselle MD pager [phone]    cell [phone] 02/09/2013, 2:31 PM     Additional Objective Labs: Basic Metabolic Panel:  Recent Labs Lab 02/04/13 1242 02/06/13 0927 02/08/13 0207 02/09/13 0555  NA 136 140 138 140  K 4.5 4.5 4.6 4.3  CL 96 98 98 96  CO2 24 24 26 25   GLUCOSE 156* 139* 169* 149*  BUN 46* 36* 29* 37*  CREATININE 6.85* 6.02* 5.28* 6.77*  CALCIUM 9.3 9.8 9.8 9.8  PHOS 6.2* 5.4*  --  4.9*   Liver Function Tests:  Recent Labs Lab 02/02/13 1430 02/04/13 1242 02/06/13 0927  AST  --   --  16  ALT  --   --  11  ALKPHOS  --   --  103  BILITOT  --   --  0.9  PROT  --   --  6.2  ALBUMIN 2.5* 2.5*  2.6*  2.6*   CBC:  Recent Labs Lab 02/03/13 1108 02/04/13 1242 02/06/13 0927 02/08/13 0207 02/09/13 0555  WBC 12.9* 13.3* 15.6* 15.3* 13.2*  NEUTROABS  --   --   --  12.9* 10.8*  HGB 14.0 13.1 13.7 13.6 13.9  HCT 43.4 40.4 42.0 41.6 43.0  MCV 96.4 95.3 96.3 97.4 97.9  PLT 322 380 371 380 323  Cardiac Enzymes:  Recent Labs Lab 02/03/13 1650 02/03/13 2240 02/04/13 0525  TROPONINI <0.30 <0.30 <0.30   CBG:  Recent Labs Lab 02/07/13 2128 02/08/13 0724 02/08/13 1208 02/08/13 1635 02/08/13 2051  GLUCAP 170* 153* 137* 159* 139*   Lab Results  Component Value Date   INR 1.49 02/09/2013   INR 1.51* 02/08/2013   INR 1.60* 02/08/2013    Medications: . heparin 1,300  Units/hr (02/09/13 0145)   . amiodarone  200 mg Oral BID  . calcium acetate  2,001 mg Oral TID WC  . collagenase   Topical Daily  . feeding supplement (RESOURCE BREEZE)  1 Container Oral TID WC  . insulin aspart  0-9 Units Subcutaneous TID WC  . magic mouthwash  5 mL Oral TID  . metoprolol  100 mg Oral BID  . multivitamin  1 tablet Oral QHS  . pantoprazole  40 mg Oral Daily  . piperacillin-tazobactam (ZOSYN)  IV  2.25 g Intravenous Q8H  . sodium chloride  3 mL Intravenous Q12H  . sodium chloride  3 mL Intravenous Q12H  . vancomycin  1,000 mg Intravenous Q M,W,F-HD

## 2013-02-09 NOTE — Progress Notes (Signed)
  Date: 02/09/2013  Patient name: Chris Vance  Medical record number: [id number]  Date of birth: [birth year]   This patient has been seen and the plan of care was discussed with the house staff. Please see their note for complete details. I concur with their findings with the following additions/corrections:  Seen during HD. Patient alert and oriented.  He was doing well, no acute complaints.  Agree with plan.  Patient not to have narcotics given confusion, tylenol as needed for pain.   Inez Catalina, MD 02/09/2013, 4:07 PM

## 2013-02-09 NOTE — Op Note (Signed)
   PATIENT: Chris Vance  MRN: [id number] DOB: [birth year]    DATE OF PROCEDURE: 02/09/2013  INDICATIONS: Chris Vance is a 72 y.o. male presents with a nonhealing wound of his right foot. He comes in for arteriography in order to evaluate his options for revascularization.  PROCEDURE:  1. Ultrasound-guided access to the left common femoral artery 2. Aortogram with bilateral iliac arteriogram, and bilateral lower extremity runoff 3. Selective catheterization of the right external iliac artery with right lower extremity runoff  SURGEON: Di Kindle. Edilia Bo, MD, FACS  ANESTHESIA: local with sedation   EBL: minimal  TECHNIQUE: The patient was taken to the peripheral vascular lab and received 50 mcg of fentanyl. Both groins were prepped and draped in usual sterile fashion. After the skin was anesthetized with lidocaine, and under ultrasound guidance, the left common femoral artery was cannulated and a guidewire introduced into the infrarenal aorta under fluoroscopic control. A 5 French sheath was introduced over the wire. The pigtail catheter was positioned at the L1 vertebral body and flush aortogram obtained. The catheter was then repositioned above the aortic bifurcation and oblique iliac projection was obtained. Bilateral lower extremity runoff films were obtained. Next the pigtail catheter was exchanged for a crossover catheter. An angled Glidewire was advanced into the common femoral artery on the right and then the crossover catheter exchanged for an endhole catheter. Selective right external iliac arteriotomy pain with a spot film of the right leg.  At the completion of the procedure, the endhole catheter was removed, the patient was transferred to the holding area for removal of the sheath.  FINDINGS:  1. There are single renal arteries bilaterally with no significant renal artery stenosis identified. There is mild diffuse calcific disease of bilateral common iliac arteries, and  external iliac arteries and hypogastric arteries. 2. On the right side, which is the symptomatic side, there is an eccentric calcified plaque in the right common femoral artery produces an approximately 40% narrowing. The superficial femoral artery on the right is occluded at its origin and is reconstitution of the popliteal artery just above the knee. There is some disease in the popliteal artery at the level of the knee. The below knee popliteal artery segment is patent. The anterior tibial artery has moderate disease disease but is patent. The peroneal arteries occluded proximally but reconstituted some distally. The posterior tibial artery is occluded. 3. On the left side, the common femoral and deep femoral arteries are patent. There is mild diffuse disease of the left superficial femoral artery. There is moderate disease a pop 2 artery on the left. The proximal anterior tibial, tibial peroneal trunk, posterior tibial, and peroneal arteries are patent although there is fear diffuse disease distally and poor visualization distally.  Waverly Ferrari, MD, FACS [facility]  DATE OF DICTATION:   02/09/2013

## 2013-02-09 NOTE — Progress Notes (Signed)
OT Cancellation Note  Patient Details Name: Chris Vance MRN: [id number] DOB: 05-06-1940   Cancelled Treatment:    Reason Eval/Treat Not Completed: Patient at procedure or test/ unavailable. Pt now ready for dialysis and then per his nurse will be moving to 3W afterwards. Will re attempt tomorrow , Galen Manila 02/09/2013, 11:36 AM

## 2013-02-09 NOTE — Progress Notes (Signed)
OT Cancellation Note  Patient Details Name: Chris Vance MRN: [id number] DOB: [birth year]   Cancelled Treatment:    Reason Eval/Treat Not Completed: Patient at procedure or test/ unavailable. Pt off floor at cath lab, will re attempt later today  Galen Manila 02/09/2013, 10:23 AM

## 2013-02-09 NOTE — Progress Notes (Signed)
Patient is confused; disoriented to time, place and situation. Experiencing paranoia, auditory and visual hallucinations; currently convinced that police officers are outside his room and are coming to take him away. Removes telemetry leads and hospital gown frequently. Attempts to dislodge IV lines.  Follows commands but has very poor short-term recall.  Vital signs are WDL for patient at this time: T 98.3 P 99 - atrial fibrillation on telemetry R 20 BP 130/71 SPO2 93% on rm air  Patient reports no pain.  CBG = 159.  Communicated with MD on-call earlier this shift regarding confusion and hallucinations. She stated that "sundowning" behavior has been characteristic of this patient since admission. No new orders received at that time.  Patient is in a safety monitored room. Currently cooperative and not attempting to get out of bed unassisted. Would still consider extremely high risk for fall with injury.  Frequent rounding protocol initiated. Will continue to monitor closely.

## 2013-02-09 NOTE — Clinical Social Work Placement (Addendum)
Clinical Social Work Department CLINICAL SOCIAL WORK PLACEMENT NOTE 02/09/2013  Patient:  Chris Vance, Chris Vance  Account Number:  [id number] Admit date:  01/30/2013  Clinical Social Worker:  Genelle Bal, LCSW  Date/time:  02/09/2013 06:37 AM  Clinical Social Work is seeking post-discharge placement for this patient at the following level of care:   SKILLED NURSING   (*CSW will update this form in Epic as items are completed)   02/02/2013  Patient/family provided with [facility] Department of Clinical Social Work's list of facilities offering this level of care within the geographic area requested by the patient (or if unable, by the patient's family).  02/02/2013  Patient/family informed of their freedom to choose among providers that offer the needed level of care, that participate in Medicare, Medicaid or managed care program needed by the patient, have an available bed and are willing to accept the patient.    Patient/family informed of [facility]' ownership interest in [facility], as well as of the fact that they are under no obligation to receive care at this facility.  PASARR submitted to EDS on 02/03/2013 PASARR number received from EDS on 02/04/2013 - [id number]  FL2 transmitted to all facilities in geographic area requested by pt/family on  02/04/2013 FL2 transmitted to all facilities within larger geographic area on   Patient informed that his/her managed care company has contracts with or will negotiate with  certain facilities, including the following:     Patient/family informed of bed offers received:  02/07/2013 Patient chooses bed at  Physician recommends and patient chooses bed at    Patient to be transferred to  on   Patient to be transferred to facility by   The following physician request were entered in Epic:  Additional Comments: 02/09/13: Patient transferred from 6E to 3W. Handoff completed and given to CSW for receiving unit.

## 2013-02-09 NOTE — Procedures (Signed)
I was present at this dialysis session, have reviewed the session itself and made  appropriate changes  Vinson Moselle MD (pgr) [phone]    ([phone] 02/09/2013, 2:31 PM

## 2013-02-09 NOTE — Progress Notes (Addendum)
VASCULAR LAB PRELIMINARY  PRELIMINARY  PRELIMINARY  PRELIMINARY  Right Lower Extremity Vein Map    Right Great Saphenous Vein   Segment Diameter Comment  1. Origin 4.9 mm   2. High Thigh 3.8 mm Branch   3. Mid Thigh 1.6 mm Branch   4. Low Thigh 1.7 mm Branch   5. At Knee 2.3 mm   6. High Calf 1.5 mm Branch   7. Low Calf 1.8 mm Branch 1.3 mm  8. Ankle 2.0 mm Branch 2.4 mm    mm    mm    mm     Right Small Saphenous Vein  Segment Diameter Comment  1. Origin 8.1 mm   2. High Calf 3.5 mm Branch   3. Low Calf mm Too small to measure   mm    mm    mm    mm     Elizardo Chilson, RVT 02/09/2013, 1:57 PM

## 2013-02-09 NOTE — Progress Notes (Signed)
PT Cancellation Note  Patient Details Name: Chris Vance MRN: [id number] DOB: [birth year]   Cancelled Treatment:    Reason Eval/Treat Not Completed: Patient at procedure or test/unavailable. Pt off floor at Cath Lab will re-attempt at next available time.    Donnamarie Poag [place], [place] [phone] 02/09/2013, 10:58 AM

## 2013-02-09 NOTE — Progress Notes (Signed)
Subjective:  Continues to have intermittent confusion. Undergoing arteriography this am.  HR well controlled on new regimen.  Objective: Vital signs in last 24 hours: Filed Vitals:   02/08/13 1330 02/08/13 1636 02/08/13 2042 02/09/13 0438  BP: 126/63 129/64 122/68 135/65  Pulse: 100 95 111 65  Temp: 97.4 F (36.3 C) 96.6 F (35.9 C) 96.7 F (35.9 C) 97 F (36.1 C)  TempSrc: Axillary Axillary Axillary Axillary  Resp: 21 18 20 20   Height:   5\' 10"  (1.778 m)   Weight:   173 lb 14.4 oz (78.881 kg)   SpO2: 98% 99% 100% 99%   Weight change: 2 lb (0.907 kg)  Intake/Output Summary (Last 24 hours) at 02/09/13 0753 Last data filed at 02/09/13 0300  Gross per 24 hour  Intake 383.93 ml  Output      0 ml  Net 383.93 ml   Vitals reviewed. General: resting in bed, in NAD.  HEENT: no scleral icterus Cardiac: RRR, no rubs, murmurs or gallops.  Pulm: bibasilar crackles anteriorly, scattered wheezes. No respiratory distress.  Abd: soft, nontender, nondistended, BS present Ext: warm and well perfused, trace pitting edema bilaterally. Erythema, increased warmth of the right ankle with wound in right malleolus covered with eschar. Left lateral foot with 5mm full thickeness minimum ss output.  Neuro: alert and oriented to person, year, month, and location, cranial nerves II-XII grossly intact, moves all extremities voluntarily.  Legs: Feet:Chronic wounds of the left lateral sole of foot with 5 mm full thickness breakdown and right foots lateral malleolus. No obvious bone exposure no drainage    Lab Results: Basic Metabolic Panel:  Recent Labs Lab 02/04/13 1242 02/06/13 0927 02/08/13 0207  NA 136 140 138  K 4.5 4.5 4.6  CL 96 98 98  CO2 24 24 26   GLUCOSE 156* 139* 169*  BUN 46* 36* 29*  CREATININE 6.85* 6.02* 5.28*  CALCIUM 9.3 9.8 9.8  PHOS 6.2* 5.4*  --    Liver Function Tests: CBC:  Recent Labs Lab 02/08/13 0207 02/09/13 0555  WBC 15.3* 13.2*  NEUTROABS 12.9*  10.8*  HGB 13.6 13.9  HCT 41.6 43.0  MCV 97.4 97.9  PLT 380 323   CBG:  Recent Labs Lab 02/07/13 1609 02/07/13 2128 02/08/13 0724 02/08/13 1208 02/08/13 1635 02/08/13 2051  GLUCAP 176* 170* 153* 137* 159* 139*   Hemoglobin A1C: No results found for this basename: HGBA1C,  in the last 168 hours Coagulation:  Recent Labs Lab 02/07/13 0940 02/08/13 0207 02/08/13 1500 02/09/13 0555  LABPROT 20.4* 18.6* 17.8* 17.6*  INR 1.80* 1.60* 1.51* 1.49    Studies/Results: No results found. Medications: I have reviewed the patient's current medications. Scheduled Meds: . amiodarone  200 mg Oral BID  . calcium acetate  2,001 mg Oral TID WC  . collagenase   Topical Daily  . feeding supplement (RESOURCE BREEZE)  1 Container Oral TID WC  . insulin aspart  0-9 Units Subcutaneous TID WC  . magic mouthwash  5 mL Oral TID  . metoprolol  100 mg Oral BID  . multivitamin  1 tablet Oral QHS  . pantoprazole  40 mg Oral Daily  . piperacillin-tazobactam (ZOSYN)  IV  2.25 g Intravenous Q8H  . sodium chloride  3 mL Intravenous Q12H  . sodium chloride  3 mL Intravenous Q12H  . vancomycin  1,000 mg Intravenous Q M,W,F-HD   Continuous Infusions: . heparin 1,300 Units/hr (02/09/13 0145)   PRN Meds:.sodium chloride, acetaminophen, acetaminophen, sodium chloride Assessment/Plan:  Mr. Langham is a 72 year old man with PMH of A.fib (on coumadin, BB), ESRD on HD, DM2, presenting with encephalopathy and sepsis due to bilateral foot ulcers.   Sepsis 2/2 bil foot wounds and right ankle cellulitis (Wound Bil Feet)  The patient has bilateral feet wound concerning for cellulitis and possible deep infection including OM. Given tachycardia, AMS and leukocytosis at presentation, this represented severe sepsis that has now resolved. CXR clear aside from 4 rib fractures with no effusion, pulm edema, or consolidation concerning for PNA. Patient's neck is supple. No lateralizing neuro findings, thus meningitis is  less likely. XR of Left foot and Right ankle did not rule in/out OM. He continues to improve clinically, MRI bl feet demonstrated possible OM of left 5th digit, myositis of right foot and left foot. No evidence of septic joint at this time. Diffuse cellulitis is present.  - Continue Vanc Zosyn IV per pharm  - Consulted Ortho for eval and treatment (Dr. Ranell Patrick) who rec continuation of ABX and discahrge with outpatient f/u with Dr. Lajoyce Corners. Dr. Lajoyce Corners rec Santyl and ABI with possible vasc surgery eval. Pt may require surgical debridement during hospitalization. Dr. Lajoyce Corners will continue to follow. Dr. Hart Rochester with vascular surgery plans to perform arteriography on Monday once INR is <1.6 due to concern about vascular insufficiency of the foot.  ESRD c/b Hyperkalemia - (resolved). He had missed 2 hemodialysis sessions the week prior to his presentation with K of 6.9 that has neem now stabilized after emergent HD.  - HD per nephrology, plan for another session on Monday with fistulagram.   Acute Encephalopathy  Etiology of patients acute encephalopathy is likely multifactorial. Likely causes include UTI, sepsis, bil foot infection, Uremia, and drug side effect, and baseline dementia. Patient's neck is supple. No lateralizing neuro findings, thus meningitis is less likely. Mental status has much improved thought he may have cognitive impairment at baseline. Mini Cog with normal clock face, 2/3 five minute recall on day two of admission. However, the patient continues to have intermittent episodes of confusion and agitation.  - See sepsis plan  - Patient appears to not tolerate opiates after he becomes altered with even low dose Vicodin.  Plan to use tylenol prn.  SVT (Afib with RVR)  Patients SVT is of unknown etiology (likely afib with RVR) at this time. Patient reports not taking his rate control medications in the two days before admission. Likely he has a history of SVT with RVR that is rate controlled with an  atypical regimen in metoprolol 50 mg BID and labetalol 100 mg bid. At admission, patient was given Metoprolol 50 mg BID which achieved prompt rate control. On 02/02/13 the patient refused his rate control meds. That night he became tachycardic to 140's. In the AM, he was agreeable to taking his HR meds and HR was less than 110. On 12/11 his HR continued to be >110, therefore consulted Cardiology (Dr. Mayford Knife is patients cardiologist) who increased metoprolol to 100 mg BID and rec starting amio if this does control the rate. Amio was started on 12/12. HR 90-120, mostly under 110.  Rib fractures  Multiple rib fractures. Patient is breathing comfortably. Will hold narcotics given AMS and administer tylenol prn. No signs of paradoxical chest wall movement at this time. Thus, no concern for flail chest. Of note, binders are contraindicated as they may increase risk of respiratory complications. His pain is not well controlled with Tylenol alone and he is at risk of acute delirium  2/2 pain.  -Start small dose of Vicodin -Incentive spirometry  Coumadin Treatment  - He is on coumadin Therapy for Afib as outpatient. Initially, patients INR was >10, but recheck showed INR 1.32. Thus the initial was likely a lab error. Patient was started on coumadin per pharmacy. He became therapeutic. Once, Vasc surgery recommended arteriography, coumadin was held.  Last dose was on 02/03/13. May consider reversal with Vit K as INR has continued to trend up (3.92) on 02/06/13. Vit K given on 12/12. INR 1.77 on 12/13. Likely will be under 1.6 for arteriography on Monday.  DM II  Home lantus dose is 58 U qhs. Decreased to 40 U while inpatient. SSI sensitive. CBG qhc and hs. Hg A1C of 6.5% Low blood sugar this AM in 50's. Likely due to NPO status. As aptient has had inconsisten PO intake of food, lantus has been held.  -- SSI  SW  Patient was brought from home that is filled with trash, excrement, dirty diapers and rodents. Son  feels that house needs to be condemned. Patient will likely need SNF placement for rehab.  - Consult SW  DVT PPx: Hep sq TID  Dispo: Disposition is deferred at this time, awaiting improvement of current medical problems.   The patient does have a current PCP Aida Puffer, MD) and does not need an [facility] hospital follow-up appointment after discharge.  The patient does have transportation limitations that hinder transportation to clinic appointments.  .Services Needed at time of discharge: Y = Yes, Blank = No PT: Pending eval  OT: Pending eval  RN:   Equipment: Pending eval  Other:     LOS: 10 days   Pleas Koch, MD 02/09/2013, 7:53 AM

## 2013-02-09 NOTE — Progress Notes (Signed)
ANTIBIOTIC CONSULT NOTE - FOLLOW UP  Pharmacy Consult for vancomycin/zosyn Indication: bilateral foot wounds and osteomyelitis of left 5th digit   Allergies  Allergen Reactions  . Ace Inhibitors   . Lisinopril Cough    Patient Measurements: Height: 5\' 10"  (177.8 cm) Weight: 176 lb 9.4 oz (80.1 kg) IBW/kg (Calculated) : 73  Vital Signs: Temp: 97.1 F (36.2 C) (12/15 1200) Temp src: Oral (12/15 1200) BP: 136/88 mmHg (12/15 1300) Pulse Rate: 94 (12/15 1300) Intake/Output from previous day: 12/14 0701 - 12/15 0700 In: 413.9 [P.O.:60; I.V.:253.9; IV Piggyback:100] Out: -  Intake/Output from this shift:    Labs:  Recent Labs  02/08/13 0207 02/09/13 0555  WBC 15.3* 13.2*  HGB 13.6 13.9  PLT 380 323  CREATININE 5.28* 6.77*   Estimated Creatinine Clearance: 10.3 ml/min (by C-G formula based on Cr of 6.77).  Recent Labs  02/09/13 1200  VANCORANDOM 16.9     Microbiology: Recent Results (from the past 720 hour(s))  MRSA PCR SCREENING     Status: None   Collection Time    02/03/13  4:45 PM      Result Value Range Status   MRSA by PCR NEGATIVE  NEGATIVE Final   Comment:            The GeneXpert MRSA Assay (FDA     approved for NASAL specimens     only), is one component of a     comprehensive MRSA colonization     surveillance program. It is not     intended to diagnose MRSA     infection nor to guide or     monitor treatment for     MRSA infections.    Anti-infectives   Start     Dose/Rate Route Frequency Ordered Stop   02/02/13 1200  vancomycin (VANCOCIN) IVPB 1000 mg/200 mL premix     1,000 mg 200 mL/hr over 60 Minutes Intravenous Every M-W-F (Hemodialysis) 01/31/13 0043     01/31/13 0045  vancomycin (VANCOCIN) 1,750 mg in sodium chloride 0.9 % 500 mL IVPB     1,750 mg 250 mL/hr over 120 Minutes Intravenous  Once 01/31/13 0043 01/31/13 0315   01/31/13 0045  piperacillin-tazobactam (ZOSYN) IVPB 2.25 g     2.25 g 100 mL/hr over 30 Minutes Intravenous 3  times per day 01/31/13 0043        Assessment: 72 YOM on D#10 for bilateral foot wounds and osteomyelitis of left 5th digit, no culture, afebrile, wbc 13.2, trending down. Pt. Is on HD MWF, pre-HD vancomycin level (16.9) is therapeutic on vancomycin 1g IV Q HD. Pt. Has been tolerating HD well  Vanc 12/6 >> Zosyn 12/6 >>  Goal of Therapy:  Pre-HD Vancomycin level 15-25 mcg/ml   Plan:  - Continue vancomycin 1g IV Q HD on MWF - zosyn 2.25g IV Q 8 hrs - f/u HD schedule and tolerance - f/u LOT for abx.  Bayard Hugger, PharmD, BCPS  Clinical Pharmacist  Pager: [phone]   02/09/2013,1:58 PM

## 2013-02-09 NOTE — Progress Notes (Signed)
PT Cancellation Note  Patient Details Name: Chris Vance MRN: [id number] DOB: 1940/06/06   Cancelled Treatment:    Reason Eval/Treat Not Completed: Patient at procedure or test/unavailable. Pt off the floor again; this time at dialysis. Per RN; pt planned to transfer to 3W this afternoon. Will re-attempt to see tomorrow.    Donnamarie Poag [place], [place] [phone] 02/09/2013, 11:48 AM

## 2013-02-10 LAB — GLUCOSE, CAPILLARY
Glucose-Capillary: 120 mg/dL — ABNORMAL HIGH (ref 70–99)
Glucose-Capillary: 140 mg/dL — ABNORMAL HIGH (ref 70–99)

## 2013-02-10 LAB — PROTIME-INR: INR: 1.48 (ref 0.00–1.49)

## 2013-02-10 NOTE — Progress Notes (Signed)
Physical Therapy Treatment Patient Details Name: Chris Vance MRN: [id number] DOB: [birth year] Today's Date: 02/10/2013 Time: 4098-1191 PT Time Calculation (min): 23 min  PT Assessment / Plan / Recommendation  History of Present Illness Pt was admitted 12/4- after missing dialysis and police finding him down at home confused; bleeding from fall at home.  Pt was dx with bil foot wounds/cellulitis, AMS (worse than PTA), ESRD, and anemia.     PT Comments   Pt continues to be generally confused, but is moving better than anticipated despite not being able to work with PT since last Wed (02/04/13).  Min-mod assist for mobility and gait with limited endurance continues to make him a candidate for SNF placement at d/c.    Follow Up Recommendations  SNF     Does the patient have the potential to tolerate intense rehabilitation    NA  Barriers to Discharge   None      Equipment Recommendations  Rolling walker with 5" wheels    Recommendations for Other Services   None  Frequency Min 2X/week   Progress towards PT Goals Progress towards PT goals: Progressing toward goals  Plan Current plan remains appropriate;Frequency needs to be updated    Precautions / Restrictions Precautions Precautions: Fall   Pertinent Vitals/Pain With gait pt with DOE 2/4, O2 sats and HR stable on RA.      Mobility  Bed Mobility Bed Mobility: Supine to Sit;Sitting - Scoot to Edge of Bed Supine to Sit: 4: Min assist;With rails;HOB flat Sitting - Scoot to [place] of Bed: 4: Min assist;With rail Details for Bed Mobility Assistance: Min assist to support trunk during transition to sitting.  Heavy use of railing for support.   Transfers Transfers: Sit to Stand;Stand to Sit Sit to Stand: 3: Mod assist;4: Min assist;With upper extremity assist;With armrests;From bed;From chair/3-in-1 Stand to Sit: 4: Min assist;With upper extremity assist;With armrests;To chair/3-in-1;To bed Details for Transfer Assistance: from  higher bed, pt able to get to standing with min assit, verbal cues for hand placement.  From lower recliner chair, pt needs mod assist to get to standing and continued verbal cues for safe hand placement (no carryover from previous cues).   Ambulation/Gait Ambulation/Gait Assistance: 4: Min assist Ambulation Distance (Feet): 95 Feet (45x1, seated rest break, 55 x 1) Assistive device: Rolling walker Ambulation/Gait Assistance Details: recliner chair to follow for safety during gait and to encourage increased distance.  Max verbal cues for upright posture and safe use of RW.  Increased DOE with gait to 2/4, O2 sats and HR remain stable on RA.   Gait Pattern: Step-through pattern;Shuffle;Trunk flexed      PT Goals (current goals can now be found in the care plan section) Acute Rehab PT Goals Patient Stated Goal: to go home, regain his independence.    Visit Information  Last PT Received On: 02/10/13 Assistance Needed: +2 (for chair to follow for gait) History of Present Illness: Pt was admitted 12/4- after missing dialysis and police finding him down at home confused; bleeding from fall at home.  Pt was dx with bil foot wounds/cellulitis, AMS (worse than PTA), ESRD, and anemia.      Subjective Data  Subjective: Pt speaking, but seems generally confused/disoriented.   Patient Stated Goal: to go home, regain his independence.     Cognition  Cognition Arousal/Alertness: Awake/alert Behavior During Therapy: WFL for tasks assessed/performed Overall Cognitive Status: No family/caregiver present to determine baseline cognitive functioning Area of Impairment: Orientation;Attention;Memory;Safety/judgement;Awareness Orientation Level: Disoriented  to;Place;Situation Current Attention Level: Sustained Memory: Decreased short-term memory Following Commands: Follows one step commands with increased time Safety/Judgement: Decreased awareness of safety;Decreased awareness of deficits Awareness:  Intellectual General Comments: Generally confused, has difficult time reasoning.      Balance  Static Sitting Balance Static Sitting - Balance Support: Bilateral upper extremity supported;Feet supported Static Sitting - Level of Assistance: 5: Stand by assistance Static Standing Balance Static Standing - Balance Support: Bilateral upper extremity supported Static Standing - Level of Assistance: 4: Min assist Dynamic Standing Balance Dynamic Standing - Balance Support: Bilateral upper extremity supported Dynamic Standing - Level of Assistance: 4: Min assist  End of Session PT - End of Session Equipment Utilized During Treatment: Gait belt Activity Tolerance: Patient limited by fatigue Patient left: in chair;with call bell/phone within reach;with nursing/sitter in room     [place] B. [name], PT, DPT [phone]   02/10/2013, 4:16 PM

## 2013-02-10 NOTE — Progress Notes (Signed)
UR Completed Andrew Soria Graves-Bigelow, RN,BSN [phone]  

## 2013-02-10 NOTE — Progress Notes (Addendum)
  [facility] Progress Note    Subjective: alert, no complaints, had angiogram yesterday   Exam  Blood pressure 130/66, pulse 105, temperature 97.6 F (36.4 C), temperature source Oral, resp. rate 18, height 5\' 10"  (1.778 m), weight 79.1 kg (174 lb 6.1 oz), SpO2 97.00%. Gen: calm, slurred speech, responds reasonably well Heart: irreg irreg , no M or rub Lungs: no wheezes or rales  Abd: soft NT, nd, +bs Ext: no sig edema, hematoma L groin  Access: R AVF + bruit   Dialysis: [facility]  3.75hrs  F180   450/1.5  81kg  3K/2.25Ca   Prof 4   R AVF   Heparin 7800 Hectorol 3 (held 12/13 for Cleon Dew)  Assessment/Plan:  1. Bilateral foot wounds / cellulitis: on vanc/zosyn, s/p angiogram. Afebrile 2. AMS: stable but worse than PTA 3. ESRD: MWF HD, tight hep with hematoma L groin 4. Anemia - Hgb > 13 ESA on hold  5. Secondary hyperparathyroidism - Ca 9.8 (10.9 corrected) Last PTH 308. Continue 2 Ca bath, hold hectorol for hypercalcemia; Phosphorus elevated- binders adjusted 6. HTN/volume - metoprolol po, below dry wt, lower dry at discharge. 7. Nutrition - continue renal diet, multivitamin 8. A fib / RVR: per cardiology, on amio and BB 9. Poor social situation: for SNFP per CM    Vinson Moselle MD  pager [phone]    cell [phone]  02/10/2013, 12:03 PM   Recent Labs Lab 02/04/13 1242 02/06/13 0927 02/08/13 0207 02/09/13 0555 02/09/13 1925  NA 136 140 138 140  --   K 4.5 4.5 4.6 4.3  --   CL 96 98 98 96  --   CO2 24 24 26 25   --   GLUCOSE 156* 139* 169* 149*  --   BUN 46* 36* 29* 37*  --   CREATININE 6.85* 6.02* 5.28* 6.77* 3.57*  CALCIUM 9.3 9.8 9.8 9.8  --   PHOS 6.2* 5.4*  --  4.9*  --     Recent Labs Lab 02/04/13 1242 02/06/13 0927  AST  --  16  ALT  --  11  ALKPHOS  --  103  BILITOT  --  0.9  PROT  --  6.2  ALBUMIN 2.5* 2.6*  2.6*    Recent Labs Lab 02/08/13 0207 02/09/13 0555 02/09/13 1925  WBC 15.3* 13.2* 12.2*  NEUTROABS 12.9* 10.8*  --    HGB 13.6 13.9 13.3  HCT 41.6 43.0 40.9  MCV 97.4 97.9 97.4  PLT 380 323 256   . amiodarone  200 mg Oral BID  . calcium acetate  2,001 mg Oral TID WC  . collagenase   Topical Daily  . feeding supplement (RESOURCE BREEZE)  1 Container Oral TID WC  . insulin aspart  0-9 Units Subcutaneous TID WC  . magic mouthwash  5 mL Oral TID  . metoprolol  100 mg Oral BID  . multivitamin  1 tablet Oral QHS  . pantoprazole  40 mg Oral Daily  . piperacillin-tazobactam (ZOSYN)  IV  2.25 g Intravenous Q8H  . sodium chloride  3 mL Intravenous Q12H  . sodium chloride  3 mL Intravenous Q12H  . vancomycin  1,000 mg Intravenous Q M,W,F-HD   . heparin 1,300 Units/hr (02/09/13 1848)   sodium chloride, acetaminophen, acetaminophen, ondansetron (ZOFRAN) IV, sodium chloride

## 2013-02-10 NOTE — Progress Notes (Signed)
Occupational Therapy Treatment Patient Details Name: THAILAND DUBE MRN: [id number] DOB: [birth year] Today's Date: 02/10/2013 Time: 1352-1410 OT Time Calculation (min): 18 min  OT Assessment / Plan / Recommendation  History of present illness Pt was admitted 12/4- after missing dialysis and police finding him down at home confused; bleeding from fall at home.   OT comments  Pt. Asleep upon arrival.  Agreeable to participation but limited with tx session secondary to visible fatigue and lethargy.  Still total a x 1 for lb dressing.  Increased time for initiation and completion of tasks, still following one step commands inconsistently  Follow Up Recommendations  SNF;Supervision/Assistance - 24 hour                     Frequency Min 2X/week   Progress towards OT Goals Progress towards OT goals: Progressing toward goals  Plan Discharge plan remains appropriate    Precautions / Restrictions Precautions Precautions: Fall   Pertinent Vitals/Pain No c/o pain    ADL  Lower Body Dressing: Performed;+1 Total assistance Where Assessed - Lower Body Dressing: Unsupported sitting ADL Comments: attempted LB dressing eob, pt. unstable trunk support with bending forward and unable to cross legs over to reach feet        OT Goals(current goals can now be found in the care plan section)    Visit Information  Last OT Received On: 02/10/13 History of Present Illness: Pt was admitted 12/4- after missing dialysis and police finding him down at home confused; bleeding from fall at home.                 Cognition  Cognition Arousal/Alertness: Awake/alert Behavior During Therapy: Flat affect Current Attention Level: Sustained Following Commands: Follows one step commands with increased time;Follows one step commands inconsistently Safety/Judgement: Decreased awareness of safety;Decreased awareness of deficits General Comments: very lethargic today, slow response to commands     Mobility  Bed Mobility Details for Bed Mobility Assistance: increased time and cueing for initiation and sequencing for oob.  min a to bring b les oob and min a to guide trunk upright, min a to guide b les back into bed and assist pt. with lying down Transfers Details for Transfer Assistance: emphasis on scooting to hob. pt. able to push through b ues and initiate scoot towards hob for better positioning in bed once lying down               End of Session OT - End of Session Activity Tolerance: Patient limited by lethargy Patient left: in bed;with call bell/phone within reach;with nursing/sitter in room       Robet Leu, COTA/L 02/10/2013, 3:18 PM

## 2013-02-10 NOTE — Progress Notes (Signed)
Pt was very compliant all day long.  Speech is garbled at times, but is A&Ox4.  There is some slight intermittent confusion, and pt is very drowsy at times.  He has taken all his po meds, agreed to dsg change, and worked with PT.    Ron (brother of pt) came by today and stayed with pt for about 2 hours.  York Spaniel he is almost back at his baseline.  Ron would like to know what the plan is.  MD, please call Ron at [phone] to update him with a plan.  Thank you.

## 2013-02-10 NOTE — Progress Notes (Addendum)
Subjective:  Very confused and agitated overnight. Thought police were trying to take him away. This morning he is A+Ox3 and making jokes with me. He adamantly states that he does not want ann amputation.  Objective: Vital signs in last 24 hours: Filed Vitals:   02/09/13 1602 02/09/13 1700 02/09/13 2131 02/10/13 0543  BP: 137/63 146/79 130/71 134/81  Pulse: 101 99 99 75  Temp: 97.9 F (36.6 C) 97.5 F (36.4 C) 98.3 F (36.8 C) 97.6 F (36.4 C)  TempSrc: Oral Oral Oral Oral  Resp: 19 20 20 18   Height:      Weight: 174 lb 6.1 oz (79.1 kg)     SpO2:  96% 93% 97%   Weight change: 2 lb 11 oz (1.22 kg)  Intake/Output Summary (Last 24 hours) at 02/10/13 0741 Last data filed at 02/09/13 1602  Gross per 24 hour  Intake      0 ml  Output   1034 ml  Net  -1034 ml   Vitals reviewed. General: resting in bed, in NAD.  HEENT: no scleral icterus Cardiac: RRR, no rubs, murmurs or gallops.  Pulm: bibasilar crackles anteriorly, scattered wheezes. No respiratory distress.  Abd: soft, nontender, nondistended, BS present Ext: warm and well perfused, trace pitting edema bilaterally. Erythema, increased warmth of the right ankle with wound in right malleolus covered with eschar. Left lateral foot with 5mm full thickeness minimum ss output.  Neuro: alert and oriented to person, year, month, and location, cranial nerves II-XII grossly intact, moves all extremities voluntarily.  Legs: Feet:Chronic wounds of the left lateral sole of foot with 5 mm full thickness breakdown and right foots lateral malleolus. No obvious bone exposure no drainage    Lab Results: Basic Metabolic Panel:  Recent Labs Lab 02/06/13 0927 02/08/13 0207 02/09/13 0555 02/09/13 1925  NA 140 138 140  --   K 4.5 4.6 4.3  --   CL 98 98 96  --   CO2 24 26 25   --   GLUCOSE 139* 169* 149*  --   BUN 36* 29* 37*  --   CREATININE 6.02* 5.28* 6.77* 3.57*  CALCIUM 9.8 9.8 9.8  --   PHOS 5.4*  --  4.9*  --    Liver  Function Tests: CBC:  Recent Labs Lab 02/08/13 0207 02/09/13 0555 02/09/13 1925  WBC 15.3* 13.2* 12.2*  NEUTROABS 12.9* 10.8*  --   HGB 13.6 13.9 13.3  HCT 41.6 43.0 40.9  MCV 97.4 97.9 97.4  PLT 380 323 256   CBG:  Recent Labs Lab 02/08/13 2051 02/09/13 0733 02/09/13 1014 02/09/13 1059 02/09/13 1743 02/09/13 2118  GLUCAP 139* 153* 140* 153* 125* 159*   Hemoglobin A1C: No results found for this basename: HGBA1C,  in the last 168 hours Coagulation:  Recent Labs Lab 02/07/13 0940 02/08/13 0207 02/08/13 1500 02/09/13 0555  LABPROT 20.4* 18.6* 17.8* 17.6*  INR 1.80* 1.60* 1.51* 1.49    Studies/Results: No results found. Medications: I have reviewed the patient's current medications. Scheduled Meds: . amiodarone  200 mg Oral BID  . calcium acetate  2,001 mg Oral TID WC  . collagenase   Topical Daily  . feeding supplement (RESOURCE BREEZE)  1 Container Oral TID WC  . insulin aspart  0-9 Units Subcutaneous TID WC  . magic mouthwash  5 mL Oral TID  . metoprolol  100 mg Oral BID  . multivitamin  1 tablet Oral QHS  . pantoprazole  40 mg Oral Daily  .  piperacillin-tazobactam (ZOSYN)  IV  2.25 g Intravenous Q8H  . sodium chloride  3 mL Intravenous Q12H  . sodium chloride  3 mL Intravenous Q12H  . vancomycin  1,000 mg Intravenous Q M,W,F-HD   Continuous Infusions: . heparin 1,300 Units/hr (02/09/13 1848)   PRN Meds:.sodium chloride, acetaminophen, acetaminophen, ondansetron (ZOFRAN) IV, sodium chloride Assessment/Plan: Mr. Broaddus is a 72 year old man with PMH of A.fib (on coumadin, BB), ESRD on HD, DM2, presenting with encephalopathy and sepsis due to bilateral foot ulcers.   Sepsis 2/2 bil foot wounds and right ankle cellulitis (Wound Bil Feet)  The patient has bilateral feet wound concerning for cellulitis and possible deep infection including OM. Given tachycardia, AMS and leukocytosis at presentation, this represented severe sepsis that has now resolved. CXR  clear aside from 4 rib fractures with no effusion, pulm edema, or consolidation concerning for PNA. Patient's neck is supple. No lateralizing neuro findings, thus meningitis is less likely. XR of Left foot and Right ankle did not rule in/out OM. He continues to improve clinically, MRI bl feet demonstrated possible OM of left 5th digit, myositis of right foot and left foot. No evidence of septic joint at this time. Diffuse cellulitis is present.  - Continue Vanc Zosyn IV per pharm  - Consulted Ortho for eval and treatment (Dr. Ranell Patrick) who rec continuation of ABX and discharge with outpatient f/u with Dr. Lajoyce Corners. Dr. Lajoyce Corners rec Santyl and ABI with possible vasc surgery eval. Pt may require surgical debridement during hospitalization. Dr. Lajoyce Corners will continue to follow. Dr. Hart Rochester with vascular surgery plans to perform arteriography on Monday once INR is <1.6 due to concern about vascular insufficiency of the foot. Arteriography completed on 12/15. Will appreciate rec's from vascular surery and Dr. Lajoyce Corners regarding further management. Of note, pt adamantly states that he does not want an amputation.  ESRD c/b Hyperkalemia - (resolved). He had missed 2 hemodialysis sessions the week prior to his presentation with K of 6.9 that has neem now stabilized after emergent HD.  - HD per nephrology  Acute Encephalopathy  Etiology of patients acute encephalopathy is likely multifactorial. Likely causes include UTI, sepsis, bil foot infection, Uremia, and drug side effect, and baseline dementia.  Mental status has much improved thought he may have cognitive impairment at baseline. The patient continues to have intermittent episodes of confusion and agitation worse at night.  - See sepsis plan  - Patient appears to not tolerate opiates after he becomes altered with even low dose Vicodin.  Plan to use tylenol prn.  SVT (Afib with RVR)  Patients SVT is of unknown etiology (likely afib with RVR) at this time. Patient reports not  taking his rate control medications in the two days before admission. Likely he has a history of SVT with RVR that is rate controlled with an atypical regimen in metoprolol 50 mg BID and labetalol 100 mg bid. At admission, patient was given Metoprolol 50 mg BID which achieved prompt rate control. On 02/02/13 the patient refused his rate control meds. That night he became tachycardic to 140's. In the AM, he was agreeable to taking his HR meds and HR was less than 110. On 12/11 his HR continued to be >110, therefore consulted Cardiology (Dr. Mayford Knife is patients cardiologist) who increased metoprolol to 100 mg BID and rec starting amio if this does control the rate. Amio was started on 12/12. HR mostly under 110 since that time.  Rib fractures  Multiple rib fractures. Patient is breathing comfortably.  Will hold narcotics given AMS and administer tylenol prn. No signs of paradoxical chest wall movement at this time. Thus, no concern for flail chest. Of note, binders are contraindicated as they may increase risk of respiratory complications. His pain is not well controlled with Tylenol alone and he is at risk of acute delirium 2/2 pain.  -Incentive spirometry  Coumadin Treatment  - He is on coumadin Therapy for Afib as outpatient. Initially, patients INR was >10, but recheck showed INR 1.32. Thus the initial was likely a lab error. Patient was started on coumadin per pharmacy. He became therapeutic. Once, Vasc surgery recommended arteriography, coumadin was held.  Last dose was on 02/03/13. May consider reversal with Vit K as INR has continued to trend up (3.92) on 02/06/13. Vit K given on 12/12. INR 1.77 on 12/13. Likely will be under 1.6 for arteriography on Monday.  DM II  Home lantus dose is 58 U qhs. Decreased to 40 U while inpatient. SSI sensitive. CBG qhc and hs. Hg A1C of 6.5% Likely due to NPO status. As aptient has had inconsistent PO intake of food, lantus has been held.  -- SSI  SW  Patient was  brought from home that is filled with trash, excrement, dirty diapers and rodents. Son feels that house needs to be condemned. Patient will likely need SNF placement for rehab.  - Consult SW  DVT PPx: Hep gtt  Dispo: Disposition is deferred at this time, awaiting improvement of current medical problems.   The patient does have a current PCP Aida Puffer, MD) and does not need an [facility] hospital follow-up appointment after discharge.  The patient does have transportation limitations that hinder transportation to clinic appointments.  .Services Needed at time of discharge: Y = Yes, Blank = No PT: Pending eval  OT: Pending eval  RN:   Equipment: Pending eval  Other:     LOS: 11 days   Pleas Koch, MD 02/10/2013, 7:41 AM

## 2013-02-10 NOTE — Progress Notes (Signed)
ANTICOAGULATION CONSULT NOTE - Follow Up Consult  Pharmacy Consult for Heparin Indication: atrial fibrillation  Allergies  Allergen Reactions  . Ace Inhibitors   . Lisinopril Cough    Patient Measurements: Height: 5\' 10"  (177.8 cm) Weight: 174 lb 6.1 oz (79.1 kg) IBW/kg (Calculated) : 73 Heparin Dosing Weight:   Vital Signs: Temp: 97.6 F (36.4 C) (12/16 0543) Temp src: Oral (12/16 0543) BP: 130/66 mmHg (12/16 0941) Pulse Rate: 105 (12/16 0941)  Labs:  Recent Labs  02/08/13 0207  02/08/13 1500 02/09/13 0555 02/09/13 1925 02/10/13 1115  HGB 13.6  --   --  13.9 13.3  --   HCT 41.6  --   --  43.0 40.9  --   PLT 380  --   --  323 256  --   LABPROT 18.6*  --  17.8* 17.6*  --  17.5*  INR 1.60*  --  1.51* 1.49  --  1.48  HEPARINUNFRC 0.36  < > 0.39 0.36  --  0.40  CREATININE 5.28*  --   --  6.77* 3.57*  --   < > = values in this interval not displayed.  Estimated Creatinine Clearance: 19.6 ml/min (by C-G formula based on Cr of 3.57).   Medications:  Scheduled:  . amiodarone  200 mg Oral BID  . calcium acetate  2,001 mg Oral TID WC  . collagenase   Topical Daily  . feeding supplement (RESOURCE BREEZE)  1 Container Oral TID WC  . insulin aspart  0-9 Units Subcutaneous TID WC  . magic mouthwash  5 mL Oral TID  . metoprolol  100 mg Oral BID  . multivitamin  1 tablet Oral QHS  . pantoprazole  40 mg Oral Daily  . piperacillin-tazobactam (ZOSYN)  IV  2.25 g Intravenous Q8H  . sodium chloride  3 mL Intravenous Q12H  . sodium chloride  3 mL Intravenous Q12H  . vancomycin  1,000 mg Intravenous Q M,W,F-HD    Assessment: 72yo ESRD male with AFib, on Heparin bridge while Coumadin on hold. Heparin level 0.4 this AM on 1300 units/hr. Plan for fem-pop bypass graft on 12/19. No bleeding noted.   Goal of Therapy:  Heparin level 0.3-0.7 units/ml Monitor platelets by anticoagulation protocol: Yes   Plan:  1. Continue heparin gtt at 1300 units/hr 2. F/u AM heparin level and  CBC 3. F/u plans to restart coumadin  Lysle Pearl, PharmD, BCPS Pager # [phone] 02/10/2013 12:24 PM

## 2013-02-10 NOTE — Progress Notes (Signed)
  Date: 02/10/2013  Patient name: Chris Vance  Medical record number: [id number]  Date of birth: 09/03/1940   This patient has been seen and the plan of care was discussed with the house staff. Please see their note for complete details. I concur with their findings with the following additions/corrections:  Please note correction, Mr. Hammen is not on narcotics due to his intermittent AMS/confusion.  He is receiving tylenol for pain.  Vascular Surgery is following, arteriogram yesterday.  Plan for possible surgery pending review of this study per documentation.   Inez Catalina, MD 02/10/2013, 10:21 AM

## 2013-02-10 NOTE — Progress Notes (Signed)
At approximately 0650, notified by phlebotomy that patient refused AM labs. Has been confused, agitated, paranoid and combative at times overnight.  Spoke with Joannie [place] in pharmacy to make aware of missed heparin level. Notified on-coming RN in handoff report.

## 2013-02-10 NOTE — Care Management Note (Addendum)
    Page 1 of 1   02/11/2013     3:30:07 PM   CARE MANAGEMENT NOTE 02/11/2013  Patient:  Chris Vance, Chris Vance   Account Number:  [id number]  Date Initiated:  02/05/2013  Documentation initiated by:  Johny Shock  Subjective/Objective Assessment:   Pt will need SNF placement     Action/Plan:   CSW working with pt and son for placement.   Anticipated DC Date:     Anticipated DC Plan:  SKILLED NURSING FACILITY         Choice offered to / List presented to:             Status of service:  In process, will continue to follow Medicare Important Message given?   (If response is "NO", the following Medicare IM given date fields will be blank) Date Medicare IM given:   Date Additional Medicare IM given:    Discharge Disposition:    Per UR Regulation:  Reviewed for med. necessity/level of care/duration of stay  If discussed at Long Length of Stay Meetings, dates discussed:   02/10/2013  02/12/2013    Comments:  02-11-13 Plan for transtibial amputation- Friday afternoon. Plan will be for SNF when medically stable.   02-10-13 Tx to 3w post arteriogram. Plan for possible surgery. Plan will be SNF when medically stable. Tomi Bamberger, RN,BSN [phone]

## 2013-02-11 LAB — CBC WITH DIFFERENTIAL/PLATELET
Basophils Relative: 1 % (ref 0–1)
Eosinophils Absolute: 0.2 10*3/uL (ref 0.0–0.7)
Lymphs Abs: 0.8 10*3/uL (ref 0.7–4.0)
MCH: 31.5 pg (ref 26.0–34.0)
MCHC: 32.3 g/dL (ref 30.0–36.0)
Neutro Abs: 12.6 10*3/uL — ABNORMAL HIGH (ref 1.7–7.7)
Neutrophils Relative %: 84 % — ABNORMAL HIGH (ref 43–77)
Platelets: 252 10*3/uL (ref 150–400)
RBC: 4.09 MIL/uL — ABNORMAL LOW (ref 4.22–5.81)
RDW: 18.5 % — ABNORMAL HIGH (ref 11.5–15.5)

## 2013-02-11 LAB — PROTIME-INR
INR: 1.52 — ABNORMAL HIGH (ref 0.00–1.49)
Prothrombin Time: 17.9 seconds — ABNORMAL HIGH (ref 11.6–15.2)

## 2013-02-11 LAB — GLUCOSE, CAPILLARY: Glucose-Capillary: 201 mg/dL — ABNORMAL HIGH (ref 70–99)

## 2013-02-11 LAB — BASIC METABOLIC PANEL
GFR calc Af Amer: 10 mL/min — ABNORMAL LOW (ref >90)
GFR calc non Af Amer: 9 mL/min — ABNORMAL LOW (ref >90)
Potassium: 4 mEq/L (ref 3.5–5.1)
Sodium: 138 mEq/L (ref 135–145)

## 2013-02-11 MED ORDER — CEFAZOLIN SODIUM-DEXTROSE 2-3 GM-% IV SOLR
2.0000 g | INTRAVENOUS | Status: AC
  Administered 2013-02-13: 2 g via INTRAVENOUS
  Filled 2013-02-11: qty 50

## 2013-02-11 NOTE — Progress Notes (Signed)
ANTICOAGULATION CONSULT NOTE - Follow Up Consult  Pharmacy Consult for Heparin Indication: atrial fibrillation  Allergies  Allergen Reactions  . Ace Inhibitors   . Lisinopril Cough    Patient Measurements: Height: 5\' 10"  (177.8 cm) Weight: 175 lb 11.3 oz (79.7 kg) IBW/kg (Calculated) : 73   Vital Signs: Temp: 96.4 F (35.8 C) (12/17 1104) Temp src: Oral (12/17 1104) BP: 130/79 mmHg (12/17 1104) Pulse Rate: 88 (12/17 1104)  Labs:  Recent Labs  02/09/13 0555 02/09/13 1925 02/10/13 1115 02/11/13 0510  HGB 13.9 13.3  --  12.9*  HCT 43.0 40.9  --  40.0  PLT 323 256  --  252  LABPROT 17.6*  --  17.5* 17.9*  INR 1.49  --  1.48 1.52*  HEPARINUNFRC 0.36  --  0.40 0.58  CREATININE 6.77* 3.57*  --  5.67*    Estimated Creatinine Clearance: 12.3 ml/min (by C-G formula based on Cr of 5.67).  Assessment: 72yo ESRD male with AFib, on Heparin bridge while Coumadin on hold. Heparin level 0.58 this AM on 1300 units/hr. Plan for fem-pop bypass graft on 12/19. Renal noted groin hematoma but no recommendation to stop heparin. Will need to monitor.   Goal of Therapy:  Heparin level 0.3-0.7 units/ml Monitor platelets by anticoagulation protocol: Yes   Plan:  1. Continue heparin gtt at 1300 units/hr 2. F/u AM heparin level and CBC 3. F/u plans to restart coumadin 4. F/u groin hematoma  Lysle Pearl, PharmD, BCPS Pager # [phone] 02/11/2013 11:50 AM

## 2013-02-11 NOTE — Progress Notes (Signed)
Patient ID: Chris Vance, male   DOB: [birth year], 72 y.o.   MRN: [id number] Patient is reevaluated today during his dialysis session. The right lateral malleolar ulcer shows purulent drainage necrotic tissue exposed fibula.  Assessment: Osteomyelitis and abscess right fibula.  Plan: I have discussed with Dr. Hart Rochester revascularization options. With patient's current status with purulent drainage exposed bone and osteomyelitis I do not feel that improve circulation will resolve this condition. I have recommended to the patient proceeding with a transtibial amputation. Will post the patient for transtibial amputation Friday afternoon. Anticipate dialysis can be completed Friday morning.

## 2013-02-11 NOTE — Progress Notes (Signed)
Subjective:  This morning he is A+Ox4 and in good spirits. NAEON.  Objective: Vital signs in last 24 hours: Filed Vitals:   02/11/13 0700 02/11/13 0719 02/11/13 0730 02/11/13 0800  BP: 143/63 137/64 141/73 133/76  Pulse: 86  89 81  Temp: 98.4 F (36.9 C)     TempSrc: Oral     Resp: 20 20 18 22   Height:      Weight: 175 lb 11.3 oz (79.7 kg)     SpO2: 96%      Weight change: -14.1 oz (-0.4 kg)  Intake/Output Summary (Last 24 hours) at 02/11/13 0820 Last data filed at 02/11/13 7829  Gross per 24 hour  Intake  852.6 ml  Output      0 ml  Net  852.6 ml   Vitals reviewed. General: resting in bed, in NAD.  HEENT: no scleral icterus Cardiac: RRR, no rubs, murmurs or gallops.  Pulm: bibasilar crackles anteriorly, scattered wheezes. No respiratory distress.  Abd: soft, nontender, nondistended, BS present Ext: warm and well perfused, trace pitting edema bilaterally. Erythema, increased warmth of the right ankle with wound in right malleolus covered with eschar. Left lateral foot with 5mm full thickeness minimum ss output.  Neuro: alert and oriented to person, year, month, and location, cranial nerves II-XII grossly intact, moves all extremities voluntarily.  Legs: Feet:Chronic wounds of the left lateral sole of foot with 5 mm full thickness breakdown and right foots lateral malleolus. No obvious bone exposure no drainage    Lab Results: Basic Metabolic Panel:  Recent Labs Lab 02/06/13 0927  02/09/13 0555 02/09/13 1925 02/11/13 0510  NA 140  < > 140  --  138  K 4.5  < > 4.3  --  4.0  CL 98  < > 96  --  96  CO2 24  < > 25  --  27  GLUCOSE 139*  < > 149*  --  145*  BUN 36*  < > 37*  --  24*  CREATININE 6.02*  < > 6.77* 3.57* 5.67*  CALCIUM 9.8  < > 9.8  --  9.5  PHOS 5.4*  --  4.9*  --   --   < > = values in this interval not displayed. Liver Function Tests: CBC:  Recent Labs Lab 02/09/13 0555 02/09/13 1925 02/11/13 0510  WBC 13.2* 12.2* 15.0*  NEUTROABS  10.8*  --  12.6*  HGB 13.9 13.3 12.9*  HCT 43.0 40.9 40.0  MCV 97.9 97.4 97.8  PLT 323 256 252   CBG:  Recent Labs Lab 02/09/13 1743 02/09/13 2118 02/10/13 0742 02/10/13 1120 02/10/13 1640 02/10/13 2054  GLUCAP 125* 159* 143* 160* 120* 140*   Hemoglobin A1C: No results found for this basename: HGBA1C,  in the last 168 hours Coagulation:  Recent Labs Lab 02/08/13 1500 02/09/13 0555 02/10/13 1115 02/11/13 0510  LABPROT 17.8* 17.6* 17.5* 17.9*  INR 1.51* 1.49 1.48 1.52*    Studies/Results: No results found. Medications: I have reviewed the patient's current medications. Scheduled Meds: . amiodarone  200 mg Oral BID  . calcium acetate  2,001 mg Oral TID WC  . collagenase   Topical Daily  . feeding supplement (RESOURCE BREEZE)  1 Container Oral TID WC  . insulin aspart  0-9 Units Subcutaneous TID WC  . magic mouthwash  5 mL Oral TID  . metoprolol  100 mg Oral BID  . multivitamin  1 tablet Oral QHS  . pantoprazole  40 mg Oral Daily  .  piperacillin-tazobactam (ZOSYN)  IV  2.25 g Intravenous Q8H  . sodium chloride  3 mL Intravenous Q12H  . sodium chloride  3 mL Intravenous Q12H  . vancomycin  1,000 mg Intravenous Q M,W,F-HD   Continuous Infusions: . heparin 1,300 Units/hr (02/11/13 0512)   PRN Meds:.sodium chloride, acetaminophen, acetaminophen, ondansetron (ZOFRAN) IV, sodium chloride Assessment/Plan: Mr. Sherrard is a 72 year old man with PMH of A.fib (on coumadin, BB), ESRD on HD, DM2, presenting with encephalopathy and sepsis due to bilateral foot ulcers.   Sepsis 2/2 bil foot wounds and right ankle cellulitis (Wound Bil Feet)  The patient has bilateral feet wound concerning for cellulitis and possible deep infection including OM. Given tachycardia, AMS and leukocytosis at presentation, this represented severe sepsis that has now resolved. MRI bl feet demonstrated possible OM of left 5th digit, myositis of right foot and left foot. No evidence of septic joint at  this time. Diffuse cellulitis is present. The cellulitis has improved with IV abx.  - Continue Vanc Zosyn IV per pharm  - Consulted Ortho for eval and treatment (Dr. Ranell Patrick) who rec continuation of ABX and discharge with outpatient f/u with Dr. Lajoyce Corners. Dr. Lajoyce Corners rec Santyl and ABI with possible vasc surgery eval. Pt may require surgical debridement during hospitalization. Dr. Lajoyce Corners will continue to follow. Dr. Hart Rochester with vascular surgery plans to perform arteriography on Monday once INR is <1.6 due to concern about vascular insufficiency of the foot. Arteriography completed on 12/15. Will appreciate rec's from vascular surery and Dr. Lajoyce Corners regarding further management. Of note, pt adamantly states that he does not want an amputation.  ESRD c/b Hyperkalemia - (resolved). He had missed 2 hemodialysis sessions the week prior to his presentation with K of 6.9 that has neem now stabilized after emergent HD.  - HD per nephrology  Acute Encephalopathy  Etiology of patients acute encephalopathy is likely multifactorial. Likely causes include UTI, sepsis, bil foot infection, uremia, drug side effect, and baseline dementia.  Mental status has much improved ans is essentially at baseline per patients borther. The patient continues to have intermittent episodes of confusion and agitation worse at night.  - Patient appears to not tolerate opiates after he becomes altered with even low dose Vicodin.  Plan to use tylenol prn.  SVT (Afib with RVR)  Patients SVT is of unknown etiology (likely afib with RVR) at this time. Patient reports not taking his rate control medications in the two days before admission. Likely he has a history of SVT with RVR that is rate controlled with an atypical regimen in metoprolol 50 mg BID and labetalol 100 mg bid. At admission, patient was given Metoprolol 50 mg BID which achieved prompt rate control. On 02/02/13 the patient refused his rate control meds. That night he became tachycardic to  140's. In the AM, he was agreeable to taking his HR meds and HR was less than 110. On 12/11 his HR continued to be >110, therefore consulted Cardiology (Dr. Mayford Knife is patients cardiologist) who increased metoprolol to 100 mg BID and rec starting amio if this does control the rate. Amio was started on 12/12. HR mostly under 110 since that time.  Rib fractures  Multiple rib fractures. Patient is breathing comfortably. Will hold narcotics given AMS and administer tylenol prn. No signs of paradoxical chest wall movement at this time. Thus, no concern for flail chest. Of note, binders are contraindicated as they may increase risk of respiratory complications. His pain is not well controlled with Tylenol  alone and he is at risk of acute delirium 2/2 pain.  -Incentive spirometry  Coumadin Treatment  -He is on coumadin Therapy for Afib as outpatient. Initially, patients INR was >10, but recheck showed INR 1.32. Thus the initial was likely a lab error. Patient was started on coumadin per pharmacy. He became therapeutic. Once, Vasc surgery recommended arteriography, coumadin was held.  Last dose was on 02/03/13. May consider reversal with Vit K as INR has continued to trend up (3.92) on 02/06/13. Vit K given on 12/12. INR 1.77 on 12/13. Plan to restart coumadin once vascular/ortho surgery gives rec's regarding need for operative management of wounds.  DM II  Home lantus dose is 58 U qhs. Decreased to 40 U while inpatient. SSI sensitive. CBG qhc and hs. Hg A1C of 6.5%. As aptient has had inconsistent PO intake of food, lantus has been held.  -- SSI  SW  Patient was brought from home that is filled with trash, excrement, dirty diapers and rodents. Son feels that house needs to be condemned. Patient will likely need SNF placement for rehab.  - Consult SW  DVT PPx: Hep gtt  Dispo: Disposition is deferred at this time, awaiting improvement of current medical problems.   The patient does have a current PCP Aida Puffer, MD) and does not need an [facility] hospital follow-up appointment after discharge.  The patient does have transportation limitations that hinder transportation to clinic appointments.  .Services Needed at time of discharge: Y = Yes, Blank = No PT: Pending eval  OT: Pending eval  RN:   Equipment: Pending eval  Other:     LOS: 12 days   Pleas Koch, MD 02/11/2013, 8:20 AM

## 2013-02-11 NOTE — Progress Notes (Signed)
  Date: 02/11/2013  Patient name: Chris Vance  Medical record number: [id number]  Date of birth: 1940-07-30   This patient has been seen and the plan of care was discussed with the house staff. Please see their note for complete details. I concur with their findings.    Inez Catalina, MD 02/11/2013, 11:39 AM

## 2013-02-11 NOTE — Progress Notes (Signed)
Patient ID: Chris Vance, male   DOB: Mar 01, 1940, 72 y.o.   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 Angiograms done which revealed right superficial femoral occlusion with patent below knee popliteal artery and severe tibial occlusive disease with only patent vessel being a diseased anterior tibial artery  Objective:  Filed Vitals:   02/11/13 1104  BP: 130/79  Pulse: 88  Temp: 96.4 F (35.8 C)  Resp: 19    Gen. confused but response to questions Lungs no rhonchi or wheezing Right lower extremity with 3+ femoral and absent distal pulses. The ulcer over the lateral malleolus measures approximately 2 cm in diameter and does have pertinent drainage and surrounding erythema   Labs:  Recent Labs Lab 02/09/13 0555 02/09/13 1925 02/11/13 0510  CREATININE 6.77* 3.57* 5.67*    Recent Labs Lab 02/08/13 0207 02/09/13 0555 02/09/13 1925 02/11/13 0510  NA 138 140  --  138  K 4.6 4.3  --  4.0  CL 98 96  --  96  CO2 26 25  --  27  BUN 29* 37*  --  24*  CREATININE 5.28* 6.77* 3.57* 5.67*  GLUCOSE 169* 149*  --  145*  CALCIUM 9.8 9.8  --  9.5    Recent Labs Lab 02/09/13 0555 02/09/13 1925 02/11/13 0510  WBC 13.2* 12.2* 15.0*  HGB 13.9 13.3 12.9*  HCT 43.0 40.9 40.0  PLT 323 256 252    Recent Labs Lab 02/09/13 0555 02/10/13 1115 02/11/13 0510  INR 1.49 1.48 1.52*    I/O last 3 completed shifts: In: 852.6 [P.O.:100; I.V.:652.6; IV Piggyback:100] Out: -   Imaging: No results found.  Assessment/Plan:    LOS: 12 days  s/p Procedure(s): ABDOMINAL AORTAGRAM ANGIOGRAM EXTREMITY BILATERAL  Agree with assessment of Dr. Lajoyce Corners--- do not think revascularization will heal this ulceration which is overlying exposed bone Would agree with proceeding with left below-knee dictation as per Dr. Audrie Lia recommendation   Josephina Gip,  MD 02/11/2013 12:51 PM

## 2013-02-11 NOTE — Progress Notes (Signed)
  [facility] Progress Note    Subjective: on dialysis, no complaints   Exam  Blood pressure 132/70, pulse 89, temperature 98.4 F (36.9 C), temperature source Oral, resp. rate 20, height 5\' 10"  (1.778 m), weight 79.7 kg (175 lb 11.3 oz), SpO2 96.00%. Gen: calm, slurred speech, responds reasonably well Heart: irreg irreg , no M or rub Lungs: no wheezes or rales  Abd: soft NT, nd, +bs Ext: no sig edema, hematoma L groin  Access: R AVF + bruit   Dialysis: [facility]  3.75hrs  F180   450/1.5  81kg  3K/2.25Ca   Prof 4   R AVF   Heparin 7800 Hectorol 3 (held 12/13 for Cleon Dew)  Assessment/Plan:  1. Bilateral foot wounds / cellulitis: on vanc/zosyn, today is day #12 of IV abx. s/p angiogram, VVS following Afebrile 2. ESRD: MWF HD, tight hep with hematoma L groin 3. Anemia - Hgb > 13 ESA on hold  4. Secondary hyperparathyroidism - Ca 9.8 (10.9 corrected) Last PTH 308. Continue 2 Ca bath, hold hectorol for hypercalcemia; Phosphorus elevated- binders adjusted 5. HTN/volume - metoprolol po, below dry wt, lower dry at discharge. 6. Nutrition - continue renal diet, multivitamin 7. A fib / RVR: per cardiology, on amio and BB 8. Poor social situation: awaiting SNFP    Chris Moselle MD  pager [phone]    cell [phone]  02/11/2013, 9:41 AM   Recent Labs Lab 02/04/13 1242 02/06/13 0927 02/08/13 0207 02/09/13 0555 02/09/13 1925 02/11/13 0510  NA 136 140 138 140  --  138  K 4.5 4.5 4.6 4.3  --  4.0  CL 96 98 98 96  --  96  CO2 24 24 26 25   --  27  GLUCOSE 156* 139* 169* 149*  --  145*  BUN 46* 36* 29* 37*  --  24*  CREATININE 6.85* 6.02* 5.28* 6.77* 3.57* 5.67*  CALCIUM 9.3 9.8 9.8 9.8  --  9.5  PHOS 6.2* 5.4*  --  4.9*  --   --     Recent Labs Lab 02/04/13 1242 02/06/13 0927  AST  --  16  ALT  --  11  ALKPHOS  --  103  BILITOT  --  0.9  PROT  --  6.2  ALBUMIN 2.5* 2.6*  2.6*    Recent Labs Lab 02/08/13 0207 02/09/13 0555 02/09/13 1925 02/11/13 0510   WBC 15.3* 13.2* 12.2* 15.0*  NEUTROABS 12.9* 10.8*  --  12.6*  HGB 13.6 13.9 13.3 12.9*  HCT 41.6 43.0 40.9 40.0  MCV 97.4 97.9 97.4 97.8  PLT 380 323 256 252   . amiodarone  200 mg Oral BID  . calcium acetate  2,001 mg Oral TID WC  . collagenase   Topical Daily  . feeding supplement (RESOURCE BREEZE)  1 Container Oral TID WC  . insulin aspart  0-9 Units Subcutaneous TID WC  . magic mouthwash  5 mL Oral TID  . metoprolol  100 mg Oral BID  . multivitamin  1 tablet Oral QHS  . pantoprazole  40 mg Oral Daily  . piperacillin-tazobactam (ZOSYN)  IV  2.25 g Intravenous Q8H  . sodium chloride  3 mL Intravenous Q12H  . sodium chloride  3 mL Intravenous Q12H  . vancomycin  1,000 mg Intravenous Q M,W,F-HD   . heparin 1,300 Units/hr (02/11/13 0512)   sodium chloride, acetaminophen, acetaminophen, ondansetron (ZOFRAN) IV, sodium chloride

## 2013-02-11 NOTE — Procedures (Signed)
I was present at this dialysis session, have reviewed the session itself and made  appropriate changes  Vinson Moselle MD (pgr) [phone]    ([phone] 02/11/2013, 9:45 AM

## 2013-02-12 LAB — CBC WITH DIFFERENTIAL/PLATELET
Basophils Absolute: 0.1 10*3/uL (ref 0.0–0.1)
Basophils Relative: 1 % (ref 0–1)
Eosinophils Absolute: 0.3 10*3/uL (ref 0.0–0.7)
HCT: 39.3 % (ref 39.0–52.0)
Hemoglobin: 13 g/dL (ref 13.0–17.0)
MCH: 31.9 pg (ref 26.0–34.0)
MCHC: 33.1 g/dL (ref 30.0–36.0)
MCV: 96.3 fL (ref 78.0–100.0)
Monocytes Absolute: 1.1 10*3/uL — ABNORMAL HIGH (ref 0.1–1.0)
Monocytes Relative: 9 % (ref 3–12)
Platelets: 238 10*3/uL (ref 150–400)
RDW: 18.6 % — ABNORMAL HIGH (ref 11.5–15.5)

## 2013-02-12 LAB — GLUCOSE, CAPILLARY
Glucose-Capillary: 128 mg/dL — ABNORMAL HIGH (ref 70–99)
Glucose-Capillary: 133 mg/dL — ABNORMAL HIGH (ref 70–99)
Glucose-Capillary: 135 mg/dL — ABNORMAL HIGH (ref 70–99)

## 2013-02-12 LAB — BASIC METABOLIC PANEL
BUN: 17 mg/dL (ref 6–23)
Chloride: 95 mEq/L — ABNORMAL LOW (ref 96–112)
Creatinine, Ser: 4.57 mg/dL — ABNORMAL HIGH (ref 0.50–1.35)
GFR calc Af Amer: 14 mL/min — ABNORMAL LOW (ref >90)

## 2013-02-12 NOTE — Progress Notes (Signed)
  Date: 02/12/2013  Patient name: Chris Vance  Medical record number: [id number]  Date of birth: 20-Feb-1941   This patient has been seen and the plan of care was discussed with the house staff. Please see their note for complete details. I concur with their findings with the following additions/corrections:  Patient improved today, more alert and oriented.  Plan for surgery tomorrow, which he is amenable to.   Inez Catalina, MD 02/12/2013, 4:19 PM

## 2013-02-12 NOTE — Progress Notes (Signed)
No new problems or recommendations.

## 2013-02-12 NOTE — Progress Notes (Signed)
Physical Therapy Treatment Patient Details Name: Chris Vance MRN: [id number] DOB: 06-20-1940 Today's Date: 02/12/2013 Time: 4098-1191 PT Time Calculation (min): 23 min  PT Assessment / Plan / Recommendation  History of Present Illness Pt was admitted 12/4- after missing dialysis and police finding him down at home confused; bleeding from fall at home.  Pt was dx with bil foot wounds/cellulitis, AMS (worse than PTA), ESRD, and anemia.  Pt to undergo R transtibial amputation tomorrow.   PT Comments   Pt admitted with above. Pt currently with functional limitations in mobility and safety with ambulation.  Pt will benefit from skilled PT to increase their independence and safety with mobility to allow discharge to the venue listed below. Patient is very deconditioned as evident when walking.  Patient will need to be re-evaluated after his transtibial amputation tomorrow   Follow Up Recommendations  SNF           Equipment Recommendations  Rolling walker with 5" wheels       Frequency Min 2X/week   Progress towards PT Goals Progress towards PT goals: Progressing toward goals  Plan Current plan remains appropriate;Frequency needs to be updated    Precautions / Restrictions Precautions Precautions: Fall Restrictions Weight Bearing Restrictions: No   Pertinent Vitals/Pain No pain reported.  On room air throughout session. SpO2 reading not attempted, but unsuccessful likely due to poor vascularization    Mobility  Bed Mobility Bed Mobility: Supine to Sit Supine to Sit: 3: Mod assist Supine to Sit: Patient Percentage: 50% Details for Bed Mobility Assistance: Requires UE assist to pull into upright sitting with trunk and UE support. Transfers Transfers: Sit to Stand;Stand to Sit Sit to Stand: 3: Mod assist;4: Min assist;With upper extremity assist;With armrests;From bed;From chair/3-in-1 Sit to Stand: Patient Percentage: 70% Stand to Sit: 4: Min assist;With upper extremity  assist;With armrests;To chair/3-in-1;To bed Stand to Sit: Patient Percentage: 70% Details for Transfer Assistance: Mod A from the lower reclining chair, Min A from the higher bed.  Assistance is given by pulling up with the pad into standing.  VC for hand placement. Ambulation/Gait Ambulation/Gait Assistance: 4: Min assist Ambulation Distance (Feet): 75 Feet Assistive device: Rolling walker Ambulation/Gait Assistance Details: Follow with recliner chair for his rest breaks.  He needed one after 40 feet, and rapidly deteriorates when he is fatigued and needs to sit down.  Sat for 5 min to catch his breath.  SpO2 reading couldn't be taken due to poor peripheral vascularization.VC for walking inside handles of RW for safety ,and for upright posture while walking.  He goes back to stooped posture when fatigued and not cued to do otherwise.  Is able to respond to VC to achieve task through semi-upright stooped posture. Gait Pattern: Step-through pattern;Shuffle;Trunk flexed Gait velocity: decreased Stairs: No      PT Goals (current goals can now be found in the care plan section) Acute Rehab PT Goals PT Goal Formulation: With patient Time For Goal Achievement: 02/16/13 Potential to Achieve Goals: Fair  Visit Information  Last PT Received On: 02/12/13 Assistance Needed: +2 History of Present Illness: Pt was admitted 12/4- after missing dialysis and police finding him down at home confused; bleeding from fall at home.  Pt was dx with bil foot wounds/cellulitis, AMS (worse than PTA), ESRD, and anemia.  Pt to undergo R transtibial amputation tomorrow(12/19).    Subjective Data  Subjective: agreeable to therapy    Cognition  Cognition Arousal/Alertness: Awake/alert Behavior During Therapy: Flat affect;Impulsive Overall Cognitive Status:  No family/caregiver present to determine baseline cognitive functioning Area of Impairment: Attention;Memory;Following  commands;Safety/judgement;Awareness;Problem solving Current Attention Level: Focused Memory: Decreased short-term memory Following Commands: Follows one step commands inconsistently Safety/Judgement: Decreased awareness of safety Awareness: Intellectual Problem Solving: Slow processing;Decreased initiation;Difficulty sequencing;Requires verbal cues;Requires tactile cues General Comments: confused, but does respond to requests with increased time and cuing       End of Session PT - End of Session Equipment Utilized During Treatment: Gait belt Activity Tolerance: Patient limited by fatigue Patient left: in chair;with call bell/phone within reach;with chair alarm set Nurse Communication: Mobility status   GP    Barrie Dunker, SPT Pager:  [phone]  Barrie Dunker 02/12/2013, 5:33 PM

## 2013-02-12 NOTE — Progress Notes (Signed)
Subjective:  This morning he is A+Ox4 and in good spirits. NAEON. He has begun to consider having his right leg amputated. Will wait for him to talk with Dr. Lajoyce Corners.  Objective: Vital signs in last 24 hours: Filed Vitals:   02/11/13 2008 02/11/13 2046 02/12/13 0520 02/12/13 0652  BP: 99/53 126/62 130/62   Pulse: 115  87   Temp: 97.7 F (36.5 C)  98 F (36.7 C)   TempSrc: Oral  Oral   Resp:   18   Height:      Weight:      SpO2: 99%  94% 99%   Weight change:   Intake/Output Summary (Last 24 hours) at 02/12/13 0820 Last data filed at 02/12/13 0504  Gross per 24 hour  Intake 550.27 ml  Output   2500 ml  Net -1949.73 ml   Vitals reviewed. General: resting in bed, in NAD.  HEENT: no scleral icterus Cardiac: RRR, no rubs, murmurs or gallops.  Pulm: bibasilar crackles anteriorly, scattered wheezes. No respiratory distress.  Abd: soft, nontender, nondistended, BS present Ext: warm and well perfused, trace pitting edema bilaterally. Erythema, increased warmth of the right ankle with wound in right malleolus covered with eschar. Left lateral foot with 5mm full thickeness minimum ss output.  Neuro: alert and oriented to person, year, month, and location, cranial nerves II-XII grossly intact, moves all extremities voluntarily.  Legs: Feet:Chronic wounds of the left lateral sole of foot with 5 mm full thickness breakdown and right foots lateral malleolus. No obvious bone exposure no drainage    Lab Results: Basic Metabolic Panel:  Recent Labs Lab 02/06/13 0927  02/09/13 0555  02/11/13 0510 02/12/13 0722  NA 140  < > 140  --  138 135  K 4.5  < > 4.3  --  4.0 3.5  CL 98  < > 96  --  96 95*  CO2 24  < > 25  --  27 29  GLUCOSE 139*  < > 149*  --  145* 149*  BUN 36*  < > 37*  --  24* 17  CREATININE 6.02*  < > 6.77*  < > 5.67* 4.57*  CALCIUM 9.8  < > 9.8  --  9.5 9.6  PHOS 5.4*  --  4.9*  --   --   --   < > = values in this interval not displayed. Liver Function  Tests: CBC:  Recent Labs Lab 02/11/13 0510 02/12/13 0722  WBC 15.0* 12.1*  NEUTROABS 12.6* 9.7*  HGB 12.9* 13.0  HCT 40.0 39.3  MCV 97.8 96.3  PLT 252 238   CBG:  Recent Labs Lab 02/10/13 1640 02/10/13 2054 02/11/13 1211 02/11/13 1620 02/11/13 2012 02/12/13 0746  GLUCAP 120* 140* 109* 201* 153* 128*   Hemoglobin A1C: No results found for this basename: HGBA1C,  in the last 168 hours Coagulation:  Recent Labs Lab 02/09/13 0555 02/10/13 1115 02/11/13 0510 02/12/13 0722  LABPROT 17.6* 17.5* 17.9* 16.1*  INR 1.49 1.48 1.52* 1.32    Studies/Results: No results found. Medications: I have reviewed the patient's current medications. Scheduled Meds: . amiodarone  200 mg Oral BID  . calcium acetate  2,001 mg Oral TID WC  . [START ON 02/13/2013]  ceFAZolin (ANCEF) IV  2 g Intravenous On Call to OR  . collagenase   Topical Daily  . feeding supplement (RESOURCE BREEZE)  1 Container Oral TID WC  . insulin aspart  0-9 Units Subcutaneous TID WC  . magic mouthwash  5 mL Oral TID  . metoprolol  100 mg Oral BID  . multivitamin  1 tablet Oral QHS  . pantoprazole  40 mg Oral Daily  . piperacillin-tazobactam (ZOSYN)  IV  2.25 g Intravenous Q8H  . sodium chloride  3 mL Intravenous Q12H  . sodium chloride  3 mL Intravenous Q12H  . vancomycin  1,000 mg Intravenous Q M,W,F-HD   Continuous Infusions: . heparin 1,300 Units/hr (02/11/13 2239)   PRN Meds:.sodium chloride, acetaminophen, acetaminophen, ondansetron (ZOFRAN) IV, sodium chloride Assessment/Plan: Mr. Kiser is a 72 year old man with PMH of A.fib (on coumadin, BB), ESRD on HD, DM2, presenting with encephalopathy and sepsis due to bilateral foot ulcers.   Sepsis 2/2 bil foot wounds and right ankle cellulitis (Wound Bil Feet)  The patient has bilateral feet wound concerning for cellulitis and possible deep infection including OM. Given tachycardia, AMS and leukocytosis at presentation, this represented severe sepsis  that has now resolved. MRI bl feet demonstrated possible OM of left 5th digit, myositis of right foot and left foot. No evidence of septic joint at this time. Diffuse cellulitis is present. The cellulitis has improved with IV abx.  - Continue Vanc Zosyn IV per pharm  - Consulted Ortho for eval and treatment (Dr. Ranell Patrick) who rec continuation of ABX and discharge with outpatient f/u with Dr. Lajoyce Corners. Dr. Lajoyce Corners rec Santyl and ABI with possible vasc surgery eval. Pt may require surgical debridement during hospitalization. Dr. Lajoyce Corners will continue to follow. Dr. Hart Rochester with vascular surgery plans to perform arteriography on Monday once INR is <1.6 due to concern about vascular insufficiency of the foot. Arteriography completed on 12/15. Dr. 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 While pt adamantly states that he does not want an amputation< I have explained the risk of death without amputation. The patient has begun to consider the procedure.  ESRD c/b Hyperkalemia - (resolved). He had missed 2 hemodialysis sessions the week prior to his presentation with K of 6.9 that has neem now stabilized after emergent HD.  - HD per nephrology  Acute Encephalopathy  Etiology of patients acute encephalopathy is likely multifactorial. Likely causes include UTI, sepsis, bil foot infection, uremia, drug side effect, and baseline dementia.  Mental status has much improved ans is essentially at baseline per patients borther. The patient continues to have intermittent episodes of confusion and agitation worse at night.  - Patient appears to not tolerate opiates after he becomes altered with even low dose Vicodin.  Plan to use tylenol prn.  SVT (Afib with RVR)  Patients SVT is of unknown etiology (likely afib with RVR) at this time. Patient reports not taking his rate control medications in the two days before admission. Likely he has a history of SVT with RVR that is rate controlled with an atypical regimen in metoprolol  50 mg BID and labetalol 100 mg bid. At admission, patient was given Metoprolol 50 mg BID which achieved prompt rate control. On 02/02/13 the patient refused his rate control meds. That night he became tachycardic to 140's. In the AM, he was agreeable to taking his HR meds and HR was less than 110. On 12/11 his HR continued to be >110, therefore consulted Cardiology (Dr. Mayford Knife is patients cardiologist) who increased metoprolol to 100 mg BID and rec starting amio if this does control the rate. Amio was started on 12/12. HR mostly under 110 since that time.  Rib fractures  Multiple rib fractures. Patient is breathing comfortably. Will  hold narcotics given AMS and administer tylenol prn. No signs of paradoxical chest wall movement at this time. Thus, no concern for flail chest. Of note, binders are contraindicated as they may increase risk of respiratory complications. His pain is not well controlled with Tylenol alone and he is at risk of acute delirium 2/2 pain.  -Incentive spirometry  Coumadin Treatment  -He is on coumadin Therapy for Afib as outpatient. Initially, patients INR was >10, but recheck showed INR 1.32. Thus the initial was likely a lab error. Patient was started on coumadin per pharmacy. He became therapeutic. Once, Vasc surgery recommended arteriography, coumadin was held.  Last dose was on 02/03/13. May consider reversal with Vit K as INR has continued to trend up (3.92) on 02/06/13. Vit K given on 12/12. INR 1.77 on 12/13. Plan to restart coumadin once vascular/ortho surgery gives rec's regarding need for operative management of wounds.  DM II  Home lantus dose is 58 U qhs. Decreased to 40 U while inpatient. SSI sensitive. CBG qhc and hs. Hg A1C of 6.5%. As aptient has had inconsistent PO intake of food, lantus has been held.  -- SSI  SW  Patient was brought from home that is filled with trash, excrement, dirty diapers and rodents. Son feels that house needs to be condemned. Patient will  likely need SNF placement for rehab.  - Consult SW  DVT PPx: Hep gtt  Dispo: Disposition is deferred at this time, awaiting improvement of current medical problems.   The patient does have a current PCP Aida Puffer, MD) and does not need an [facility] hospital follow-up appointment after discharge.  The patient does have transportation limitations that hinder transportation to clinic appointments.  .Services Needed at time of discharge: Y = Yes, Blank = No PT: Pending eval  OT: Pending eval  RN:   Equipment: Pending eval  Other:     LOS: 13 days   Pleas Koch, MD 02/12/2013, 8:20 AM

## 2013-02-12 NOTE — Progress Notes (Addendum)
ANTICOAGULATION CONSULT NOTE - Follow Up Consult  Pharmacy Consult for Heparin + vanc + zosyn Indication: atrial fibrillation + osteomyelitis  Allergies  Allergen Reactions  . Ace Inhibitors   . Lisinopril Cough    Patient Measurements: Height: 5\' 10"  (177.8 cm) Weight: 175 lb 11.3 oz (79.7 kg) IBW/kg (Calculated) : 73   Vital Signs: Temp: 98 F (36.7 C) (12/18 0520) Temp src: Oral (12/18 0520) BP: 130/62 mmHg (12/18 0520) Pulse Rate: 87 (12/18 0520)  Labs:  Recent Labs  02/09/13 1925 02/10/13 1115 02/11/13 0510 02/12/13 0722  HGB 13.3  --  12.9* 13.0  HCT 40.9  --  40.0 39.3  PLT 256  --  252 238  LABPROT  --  17.5* 17.9* 16.1*  INR  --  1.48 1.52* 1.32  HEPARINUNFRC  --  0.40 0.58 0.25*  CREATININE 3.57*  --  5.67* 4.57*    Estimated Creatinine Clearance: 15.3 ml/min (by C-G formula based on Cr of 4.57).  Assessment: 72yo ESRD male with AFib, on Heparin bridge while Coumadin on hold. Heparin level is slightly low this AM on 1300 units/hr. However, heparin drip had been turned off briefly prior to lab draw. Renal noted groin hematoma but RN states no overt bleeding. Will continue heparin and monitor closely.   Patient is also on D#13 of vancomycin + zosyn for osteomyelitis. Doses are appropriate. Pt is afebrile and WBC is 12.1. Planning possible amputation tomorrow. No cultures available.   Vanc 12/6 >> Zosyn 12/6 >>  12/15 Pre-HD vanc level = 16.9 on 1g Q HD  Goal of Therapy:  Heparin level 0.3-0.7 units/ml Monitor platelets by anticoagulation protocol: Yes   Plan:  1. Continue heparin gtt at 1300 units/hr - recheck level later today to ensure it gets back into therapeutic range 2. F/u AM heparin level and CBC 3. F/u plans to restart coumadin 4. F/u groin hematoma 5. Continue vanc 1gm post-HD on MWF 6. Continue zosyn 2.25gm IV Q8H 7. F/u renal plans, C&S, clinical status and planned LOT  Lysle Pearl, PharmD, BCPS Pager # [phone] 02/12/2013  8:53 AM   Addendum: A heparin level was rechecked at 1200 today is therapeutic at 0.4.   Plan: Continue heparin gtt at 1300 units/hr  Lysle Pearl, PharmD, BCPS Pager # [phone] 02/12/2013 1:02 PM

## 2013-02-12 NOTE — Progress Notes (Signed)
[facility] Progress Note    Subjective: ortho has seen, recommending R BKA   Exam  Blood pressure 130/62, pulse 87, temperature 98 F (36.7 C), temperature source Oral, resp. rate 18, height 5\' 10"  (1.778 m), weight 79.7 kg (175 lb 11.3 oz), SpO2 99.00%. Gen: calm, responsive, no distress Heart: irreg irreg , no M or rub Lungs: no wheezes or rales  Abd: soft NT, nd, +bs Ext: no sig edema, hematoma L groin  Access: R AVF + bruit   Dialysis: [facility]  3.75hrs  F180   450/1.5  81kg  3K/2.25Ca   Prof 4   R AVF   Heparin 7800 Hectorol 3 (held 12/13 for [place])  Assessment/Plan:  1. R ankle ulcer / osteo w exposed bone (fibula): for R BKA tomorrow per ortho; plan HD early am tomorrow prior to surgery, on vanc/zosyn 2. ESRD: HD in am 3. Anemia - Hgb > 13 ESA on hold  4. Secondary hyperparathyroidism - Ca 9.8 (10.9 corrected) Last PTH 308. Continue 2 Ca bath, hold hectorol for hypercalcemia; Phosphorus elevated- binders adjusted 5. HTN/volume - metoprolol po, below dry wt, no vol excess, UF 1-2 kg tomorrow w HD lower dry at discharge 6. Nutrition - continue renal diet, multivitamin 7. A fib / RVR: per cardiology, on amio and BB 8. Dispo: probable SNFP    Vinson Moselle MD  pager [phone]    cell [phone]  02/12/2013, 12:41 PM   Recent Labs Lab 02/06/13 0927  02/09/13 0555 02/09/13 1925 02/11/13 0510 02/12/13 0722  NA 140  < > 140  --  138 135  K 4.5  < > 4.3  --  4.0 3.5  CL 98  < > 96  --  96 95*  CO2 24  < > 25  --  27 29  GLUCOSE 139*  < > 149*  --  145* 149*  BUN 36*  < > 37*  --  24* 17  CREATININE 6.02*  < > 6.77* 3.57* 5.67* 4.57*  CALCIUM 9.8  < > 9.8  --  9.5 9.6  PHOS 5.4*  --  4.9*  --   --   --   < > = values in this interval not displayed.  Recent Labs Lab 02/06/13 0927  AST 16  ALT 11  ALKPHOS 103  BILITOT 0.9  PROT 6.2  ALBUMIN 2.6*  2.6*    Recent Labs Lab 02/09/13 0555 02/09/13 1925 02/11/13 0510 02/12/13 0722  WBC 13.2*  12.2* 15.0* 12.1*  NEUTROABS 10.8*  --  12.6* 9.7*  HGB 13.9 13.3 12.9* 13.0  HCT 43.0 40.9 40.0 39.3  MCV 97.9 97.4 97.8 96.3  PLT 323 256 252 238   . amiodarone  200 mg Oral BID  . calcium acetate  2,001 mg Oral TID WC  . [START ON 02/13/2013]  ceFAZolin (ANCEF) IV  2 g Intravenous On Call to OR  . collagenase   Topical Daily  . feeding supplement (RESOURCE BREEZE)  1 Container Oral TID WC  . insulin aspart  0-9 Units Subcutaneous TID WC  . magic mouthwash  5 mL Oral TID  . metoprolol  100 mg Oral BID  . multivitamin  1 tablet Oral QHS  . pantoprazole  40 mg Oral Daily  . piperacillin-tazobactam (ZOSYN)  IV  2.25 g Intravenous Q8H  . sodium chloride  3 mL Intravenous Q12H  . sodium chloride  3 mL Intravenous Q12H  . vancomycin  1,000 mg Intravenous Q M,W,F-HD   .  heparin 1,300 Units/hr (02/12/13 0850)   sodium chloride, acetaminophen, acetaminophen, ondansetron (ZOFRAN) IV, sodium chloride

## 2013-02-12 NOTE — Progress Notes (Signed)
Patient ID: Chris Vance, male   DOB: 12/24/1940, 72 y.o.   MRN: [id number] Patient states he is ready proceed with a transtibial amputation. We will set this up for Friday afternoon. Patient will need discharge to skilled nursing care. Okay for discharge to skilled nursing on Monday. 

## 2013-02-12 NOTE — Progress Notes (Signed)
Read, reviewed, and agree.  Mayce Noyes B. Nancyjo Givhan, PT, DPT #[phone]  

## 2013-02-12 NOTE — Progress Notes (Signed)
CSW continuing to follow.. Patient can d/c to SNF when medically stable.   Sabino Niemann, MSW, [employer] [phone]

## 2013-02-13 ENCOUNTER — Encounter (HOSPITAL_COMMUNITY): Payer: Medicare Other | Admitting: Anesthesiology

## 2013-02-13 ENCOUNTER — Encounter (HOSPITAL_COMMUNITY): Payer: Self-pay | Admitting: Anesthesiology

## 2013-02-13 ENCOUNTER — Inpatient Hospital Stay (HOSPITAL_COMMUNITY): Payer: Medicare Other | Admitting: Anesthesiology

## 2013-02-13 ENCOUNTER — Encounter (HOSPITAL_COMMUNITY)
Admission: EM | Disposition: A | Discharge status: Skilled Nursing Facility | Payer: Self-pay | Source: Home / Self Care | Attending: Internal Medicine

## 2013-02-13 HISTORY — PX: AMPUTATION: SHX166

## 2013-02-13 LAB — CBC WITH DIFFERENTIAL/PLATELET
Basophils Absolute: 0.1 10*3/uL (ref 0.0–0.1)
Basophils Relative: 1 % (ref 0–1)
Eosinophils Absolute: 0.3 10*3/uL (ref 0.0–0.7)
HCT: 40.5 % (ref 39.0–52.0)
Hemoglobin: 13.1 g/dL (ref 13.0–17.0)
Lymphocytes Relative: 6 % — ABNORMAL LOW (ref 12–46)
MCHC: 32.3 g/dL (ref 30.0–36.0)
Neutro Abs: 9.7 10*3/uL — ABNORMAL HIGH (ref 1.7–7.7)
Neutrophils Relative %: 81 % — ABNORMAL HIGH (ref 43–77)
Platelets: 236 10*3/uL (ref 150–400)
RDW: 18.6 % — ABNORMAL HIGH (ref 11.5–15.5)
WBC: 12 10*3/uL — ABNORMAL HIGH (ref 4.0–10.5)

## 2013-02-13 LAB — BASIC METABOLIC PANEL
CO2: 25 mEq/L (ref 19–32)
Chloride: 97 mEq/L (ref 96–112)
Creatinine, Ser: 5.57 mg/dL — ABNORMAL HIGH (ref 0.50–1.35)
GFR calc Af Amer: 11 mL/min — ABNORMAL LOW (ref >90)
Potassium: 3.8 mEq/L (ref 3.5–5.1)
Sodium: 137 mEq/L (ref 135–145)

## 2013-02-13 LAB — GLUCOSE, CAPILLARY
Glucose-Capillary: 126 mg/dL — ABNORMAL HIGH (ref 70–99)
Glucose-Capillary: 112 mg/dL — ABNORMAL HIGH (ref 70–99)
Glucose-Capillary: 116 mg/dL — ABNORMAL HIGH (ref 70–99)

## 2013-02-13 LAB — PROTIME-INR
INR: 1.3 (ref 0.00–1.49)
Prothrombin Time: 15.9 seconds — ABNORMAL HIGH (ref 11.6–15.2)

## 2013-02-13 LAB — HEPARIN LEVEL (UNFRACTIONATED): Heparin Unfractionated: 0.48 IU/mL (ref 0.30–0.70)

## 2013-02-13 SURGERY — BYPASS GRAFT FEMORAL-POPLITEAL ARTERY
Anesthesia: General | Site: Leg Upper | Laterality: Right

## 2013-02-13 SURGERY — AMPUTATION BELOW KNEE
Anesthesia: General | Site: Leg Lower | Laterality: Right

## 2013-02-13 MED ORDER — NEPRO/CARBSTEADY PO LIQD
237.0000 mL | ORAL | Status: DC | PRN
  Filled 2013-02-13: qty 237

## 2013-02-13 MED ORDER — CEFAZOLIN SODIUM-DEXTROSE 2-3 GM-% IV SOLR
INTRAVENOUS | Status: AC
  Filled 2013-02-13: qty 50

## 2013-02-13 MED ORDER — SODIUM CHLORIDE 0.9 % IV SOLN: 100.0000 mL | INTRAVENOUS | Status: DC | PRN

## 2013-02-13 MED ORDER — PENTAFLUOROPROP-TETRAFLUOROETH EX AERO: 1.0000 "application " | INHALATION_SPRAY | CUTANEOUS | Status: DC | PRN

## 2013-02-13 MED ORDER — LIDOCAINE-PRILOCAINE 2.5-2.5 % EX CREA: 1.0000 "application " | TOPICAL_CREAM | CUTANEOUS | Status: DC | PRN

## 2013-02-13 MED ORDER — PROPOFOL 10 MG/ML IV BOLUS
INTRAVENOUS | Status: DC | PRN
  Administered 2013-02-13: 200 mg via INTRAVENOUS

## 2013-02-13 MED ORDER — ONDANSETRON HCL 4 MG/2ML IJ SOLN: 4.0000 mg | Freq: Once | INTRAMUSCULAR | Status: DC | PRN

## 2013-02-13 MED ORDER — METOCLOPRAMIDE HCL 5 MG PO TABS
5.0000 mg | ORAL_TABLET | Freq: Three times a day (TID) | ORAL | Status: DC | PRN
  Filled 2013-02-13: qty 2

## 2013-02-13 MED ORDER — 0.9 % SODIUM CHLORIDE (POUR BTL) OPTIME
TOPICAL | Status: DC | PRN
  Administered 2013-02-13: 1000 mL

## 2013-02-13 MED ORDER — HEPARIN (PORCINE) IN NACL 100-0.45 UNIT/ML-% IJ SOLN
1300.0000 [IU]/h | INTRAMUSCULAR | Status: DC
Start: 2013-02-13 — End: 2013-02-13
  Filled 2013-02-13: qty 250

## 2013-02-13 MED ORDER — HYDROMORPHONE HCL PF 1 MG/ML IJ SOLN: 0.5000 mg | INTRAMUSCULAR | Status: DC | PRN

## 2013-02-13 MED ORDER — FENTANYL CITRATE 0.05 MG/ML IJ SOLN
25.0000 ug | INTRAMUSCULAR | Status: DC | PRN
  Administered 2013-02-13: 25 ug via INTRAVENOUS
  Administered 2013-02-13: 50 ug via INTRAVENOUS

## 2013-02-13 MED ORDER — OXYCODONE-ACETAMINOPHEN 5-325 MG PO TABS
ORAL_TABLET | ORAL | Status: AC
  Filled 2013-02-13: qty 2

## 2013-02-13 MED ORDER — OXYCODONE-ACETAMINOPHEN 5-325 MG PO TABS
2.0000 | ORAL_TABLET | ORAL | Status: DC | PRN
  Administered 2013-02-14 – 2013-02-15 (×4): 2 via ORAL
  Filled 2013-02-13 (×5): qty 2

## 2013-02-13 MED ORDER — METOCLOPRAMIDE HCL 5 MG/ML IJ SOLN
5.0000 mg | Freq: Three times a day (TID) | INTRAMUSCULAR | Status: DC | PRN
  Filled 2013-02-13: qty 2

## 2013-02-13 MED ORDER — SUFENTANIL CITRATE 50 MCG/ML IV SOLN
INTRAVENOUS | Status: DC | PRN
  Administered 2013-02-13 (×2): 5 ug via INTRAVENOUS

## 2013-02-13 MED ORDER — OXYCODONE-ACETAMINOPHEN 5-325 MG PO TABS
1.0000 | ORAL_TABLET | ORAL | Status: DC | PRN
  Administered 2013-02-13: 2 via ORAL

## 2013-02-13 MED ORDER — ALTEPLASE 2 MG IJ SOLR
2.0000 mg | Freq: Once | INTRAMUSCULAR | Status: DC | PRN
  Filled 2013-02-13: qty 2

## 2013-02-13 MED ORDER — LIDOCAINE HCL (PF) 1 % IJ SOLN: 5.0000 mL | INTRAMUSCULAR | Status: DC | PRN

## 2013-02-13 MED ORDER — FENTANYL CITRATE 0.05 MG/ML IJ SOLN
INTRAMUSCULAR | Status: AC
  Filled 2013-02-13: qty 2

## 2013-02-13 MED ORDER — HEPARIN SODIUM (PORCINE) 1000 UNIT/ML DIALYSIS: 1000.0000 [IU] | INTRAMUSCULAR | Status: DC | PRN

## 2013-02-13 SURGICAL SUPPLY — 45 items
BANDAGE ESMARK 6X9 LF (GAUZE/BANDAGES/DRESSINGS) ×1 IMPLANT
BANDAGE GAUZE ELAST BULKY 4 IN (GAUZE/BANDAGES/DRESSINGS) ×4 IMPLANT
BLADE SAW RECIP 87.9 MT (BLADE) ×2 IMPLANT
BLADE SURG 21 STRL SS (BLADE) ×2 IMPLANT
BNDG CMPR 9X6 STRL LF SNTH (GAUZE/BANDAGES/DRESSINGS) ×1
BNDG COHESIVE 6X5 TAN STRL LF (GAUZE/BANDAGES/DRESSINGS) ×3 IMPLANT
BNDG ESMARK 6X9 LF (GAUZE/BANDAGES/DRESSINGS) ×2
BNDG GAUZE ELAST 4 BULKY (GAUZE/BANDAGES/DRESSINGS) ×1 IMPLANT
CLOTH BEACON ORANGE TIMEOUT ST (SAFETY) ×2 IMPLANT
COVER SURGICAL LIGHT HANDLE (MISCELLANEOUS) ×2 IMPLANT
CUFF TOURNIQUET SINGLE 34IN LL (TOURNIQUET CUFF) IMPLANT
CUFF TOURNIQUET SINGLE 44IN (TOURNIQUET CUFF) IMPLANT
DRAIN PENROSE 1/2X12 LTX STRL (WOUND CARE) IMPLANT
DRAPE EXTREMITY T 121X128X90 (DRAPE) ×2 IMPLANT
DRAPE PROXIMA HALF (DRAPES) ×4 IMPLANT
DRAPE U-SHAPE 47X51 STRL (DRAPES) ×4 IMPLANT
DRSG ADAPTIC 3X8 NADH LF (GAUZE/BANDAGES/DRESSINGS) ×2 IMPLANT
DRSG PAD ABDOMINAL 8X10 ST (GAUZE/BANDAGES/DRESSINGS) ×2 IMPLANT
DURAPREP 26ML APPLICATOR (WOUND CARE) ×2 IMPLANT
ELECT REM PT RETURN 9FT ADLT (ELECTROSURGICAL) ×2
ELECTRODE REM PT RTRN 9FT ADLT (ELECTROSURGICAL) ×1 IMPLANT
GLOVE BIOGEL PI IND STRL 9 (GLOVE) ×1 IMPLANT
GLOVE BIOGEL PI INDICATOR 9 (GLOVE) ×1
GLOVE SURG ORTHO 9.0 STRL STRW (GLOVE) ×2 IMPLANT
GOWN PREVENTION PLUS XLARGE (GOWN DISPOSABLE) ×2 IMPLANT
GOWN SRG XL XLNG 56XLVL 4 (GOWN DISPOSABLE) ×1 IMPLANT
GOWN STRL NON-REIN XL XLG LVL4 (GOWN DISPOSABLE) ×2
KIT BASIN OR (CUSTOM PROCEDURE TRAY) ×2 IMPLANT
KIT ROOM TURNOVER OR (KITS) ×2 IMPLANT
MANIFOLD NEPTUNE II (INSTRUMENTS) ×2 IMPLANT
NS IRRIG 1000ML POUR BTL (IV SOLUTION) ×2 IMPLANT
PACK GENERAL/GYN (CUSTOM PROCEDURE TRAY) ×2 IMPLANT
PAD ARMBOARD 7.5X6 YLW CONV (MISCELLANEOUS) ×4 IMPLANT
SPONGE GAUZE 4X4 12PLY (GAUZE/BANDAGES/DRESSINGS) ×2 IMPLANT
SPONGE LAP 18X18 X RAY DECT (DISPOSABLE) IMPLANT
STAPLER VISISTAT 35W (STAPLE) IMPLANT
STOCKINETTE IMPERVIOUS LG (DRAPES) ×2 IMPLANT
SUT PDS AB 1 CT  36 (SUTURE)
SUT PDS AB 1 CT 36 (SUTURE) IMPLANT
SUT SILK 2 0 (SUTURE) ×2
SUT SILK 2-0 18XBRD TIE 12 (SUTURE) ×1 IMPLANT
TOWEL OR 17X24 6PK STRL BLUE (TOWEL DISPOSABLE) ×2 IMPLANT
TOWEL OR 17X26 10 PK STRL BLUE (TOWEL DISPOSABLE) ×2 IMPLANT
TUBE ANAEROBIC SPECIMEN COL (MISCELLANEOUS) IMPLANT
WATER STERILE IRR 1000ML POUR (IV SOLUTION) ×2 IMPLANT

## 2013-02-13 NOTE — Progress Notes (Signed)
Chaplain was given consult to meet with pt.  Pt and chaplain spoke for some time regarding his upcoming surgery.  Pt expressed that he wanted spiritual support around him for his recovery process.  Chaplain assured him that spiritual wholeness would be available to provide the spiritual care he desired.  Pt seemed grateful for the visit.

## 2013-02-13 NOTE — Progress Notes (Signed)
No new recommendations

## 2013-02-13 NOTE — H&P (View-Only) (Signed)
Patient ID: Chris Vance, male   DOB: Aug 20, 1940, 72 y.o.   MRN: [id number] Patient states he is ready proceed with a transtibial amputation. We will set this up for Friday afternoon. Patient will need discharge to skilled nursing care. Okay for discharge to skilled nursing on Monday.

## 2013-02-13 NOTE — Procedures (Signed)
I was present at this dialysis session, have reviewed the session itself and made  appropriate changes  Vinson Moselle MD (pgr) [phone]    [phone] 02/13/2013, 12:03 PM

## 2013-02-13 NOTE — Op Note (Signed)
OPERATIVE REPORT  DATE OF SURGERY: 02/13/2013  PATIENT:  Chris Vance,  72 y.o. male  PRE-OPERATIVE DIAGNOSIS:  Osteomyelitis and Gangrene Right Ankle  POST-OPERATIVE DIAGNOSIS:  Osteomyelitis and gangrene RIght ankle  PROCEDURE:  Procedure(s): Right Below Knee Amputation  SURGEON:  Surgeon(s): Nadara Mustard, MD  ANESTHESIA:   general  EBL:  min ML  SPECIMEN:  Source of Specimen:  Right leg  TOURNIQUET:   Total Tourniquet Time Documented: Thigh (Right) - 6 minutes Total: Thigh (Right) - 6 minutes   PROCEDURE DETAILS: Patient is a 72 year old gentleman with peripheral vascular disease with gangrenous ulcer over the right lateral malleolus. Patient is not felt to be a good surgical candidate he does have abscess and osteomyelitis of the lateral malleolus and presents at this time for transtibial amputation. Risks and benefits were discussed including infection neurovascular injury nonhealing of the wound need for additional surgery. Patient states he understands and wished to proceed at this time. Description of procedure patient was brought to the operating room and underwent a general anesthetic. After adequate levels of anesthesia were obtained patient's right lower extremity was prepped using DuraPrep draped into a sterile field an impervious stockinette was used to cover the right lower extremity. This was performed after a timeout. A transverse incision was made 11 cm distal to the tibial tubercle this curved proximally and a large posterior flap was created. The tibia was transected proximal to the skin incision and the fibula was transected level with the tibial incision the anterior cortex was beveled. A knife was used to create a large posterior flap. The sciatic nerve was pulled cut and allowed to retract. The vascular bundles were suture ligated with 2-0 silk. Tourniquet was deflated hemostasis was obtained. The deep and superficial fascial layers were closed using #1 PDS.  The skin was closed using staples. The wound was covered with Adaptic orthopedic sponges AB dressing Kerlix and Coban. Patient was extubated taken to the PACU in stable condition.  PLAN OF CARE: Admit to inpatient   PATIENT DISPOSITION:  PACU - hemodynamically stable.   Nadara Mustard, MD 02/13/2013 6:20 PM

## 2013-02-13 NOTE — Progress Notes (Signed)
  Date: 02/13/2013  Patient name: Chris Vance  Medical record number: [id number]  Date of birth: 04-15-1940   This patient has been seen and the plan of care was discussed with the house staff. Please see their note for complete details. I concur with their findings with the following additions/corrections:  Plan for surgery today.   Inez Catalina, MD 02/13/2013, 2:57 PM

## 2013-02-13 NOTE — Progress Notes (Signed)
Clinical Social Worker (CSW) left message for son Ronald Londo [phone] to get SNF choice.   Jetta Lout, LCSWA Weekend CSW [phone]

## 2013-02-13 NOTE — Preoperative (Signed)
Beta Blockers   Reason not to administer Beta Blockers:Not Applicable 

## 2013-02-13 NOTE — Anesthesia Procedure Notes (Signed)
Procedure Name: LMA Insertion Date/Time: 02/13/2013 5:00 PM Performed by: Coralee Rud Pre-anesthesia Checklist: Patient identified, Emergency Drugs available, Suction available and Patient being monitored Patient Re-evaluated:Patient Re-evaluated prior to inductionOxygen Delivery Method: Circle system utilized Preoxygenation: Pre-oxygenation with 100% oxygen Intubation Type: IV induction Ventilation: Mask ventilation without difficulty LMA: LMA inserted LMA Size: 5.0 Number of attempts: 1

## 2013-02-13 NOTE — Progress Notes (Signed)
ANTICOAGULATION CONSULT NOTE - Follow Up Consult  Pharmacy Consult for Heparin Indication: atrial fibrillation  Allergies  Allergen Reactions  . Ace Inhibitors   . Lisinopril Cough    Patient Measurements: Height: 5\' 10"  (177.8 cm) Weight: 175 lb 11.3 oz (79.7 kg) IBW/kg (Calculated) : 73   Vital Signs: Temp: 97.4 F (36.3 C) (12/19 0613) Temp src: Oral (12/19 0613) BP: 142/82 mmHg (12/19 0613) Pulse Rate: 63 (12/19 0613)  Labs:  Recent Labs  02/11/13 0510 02/12/13 0722 02/12/13 1145 02/13/13 0520  HGB 12.9* 13.0  --  13.1  HCT 40.0 39.3  --  40.5  PLT 252 238  --  236  LABPROT 17.9* 16.1*  --  15.9*  INR 1.52* 1.32  --  1.30  HEPARINUNFRC 0.58 0.25* 0.40 0.48  CREATININE 5.67* 4.57*  --  5.57*    Estimated Creatinine Clearance: 12.6 ml/min (by C-G formula based on Cr of 5.57).  Assessment: 72yo ESRD male with AFib, on Heparin bridge while Coumadin on hold. Heparin is at goal on 1300 units/hr. Renal noted groin hematoma but RN states no overt bleeding. Will continue heparin and monitor closely. Pt to go to OR today for amputation.   Goal of Therapy:  Heparin level 0.3-0.7 units/ml Monitor platelets by anticoagulation protocol: Yes   Plan:  1. Continue heparin gtt at 1300 units/hr - f/u post-op [facility] plans 2. F/u AM heparin level and CBC 3. F/u plans to restart coumadin 4. F/u groin hematoma  Lysle Pearl, PharmD, BCPS Pager # [phone] 02/13/2013 8:19 AM

## 2013-02-13 NOTE — Interval H&P Note (Signed)
History and Physical Interval Note:  02/13/2013 6:16 AM  Chris Vance  has presented today for surgery, with the diagnosis of Osteomyelitis and Gangrene Right Ankle  The various methods of treatment have been discussed with the patient and family. After consideration of risks, benefits and other options for treatment, the patient has consented to  Procedure(s) with comments: Right Below Knee Amputation (Right) - Right Below Knee Amputation as a surgical intervention .  The patient's history has been reviewed, patient examined, no change in status, stable for surgery.  I have reviewed the patient's chart and labs.  Questions were answered to the patient's satisfaction.     DUDA,MARCUS V

## 2013-02-13 NOTE — Transfer of Care (Signed)
Immediate Anesthesia Transfer of Care Note  Patient: Chris Vance  Procedure(s) Performed: Procedure(s) with comments: Right Below Knee Amputation (Right) - Right Below Knee Amputation  Patient Location: PACU  Anesthesia Type:General  Level of Consciousness: awake, alert , sedated and patient cooperative  Airway & Oxygen Therapy: Patient Spontanous Breathing and Patient connected to face mask oxygen  Post-op Assessment: Report given to PACU RN, Post -op Vital signs reviewed and stable and Post -op Vital signs reviewed and unstable, Anesthesiologist notified  Post vital signs: Reviewed and stable  Complications: No apparent anesthesia complications

## 2013-02-13 NOTE — Progress Notes (Addendum)
Subjective:  Patient is  Doing well this AM. Request to have chaplain see him. States that he wants the surgery to be completed. Surgery later this morning/afternoon.  Objective: Vital signs in last 24 hours: Filed Vitals:   02/13/13 0930 02/13/13 1000 02/13/13 1030 02/13/13 1100  BP: 143/74 140/75 130/78 127/83  Pulse: 87 80 94 92  Temp:      TempSrc:      Resp: 21 20 19 22   Height:      Weight:      SpO2:       Weight change:   Intake/Output Summary (Last 24 hours) at 02/13/13 1135 Last data filed at 02/12/13 1300  Gross per 24 hour  Intake     60 ml  Output      0 ml  Net     60 ml   Vitals reviewed. General: resting in bed, in NAD.  HEENT: no scleral icterus Cardiac: RRR, no rubs, murmurs or gallops.  Pulm: bibasilar crackles anteriorly, scattered wheezes. No respiratory distress.  Abd: soft, nontender, nondistended, BS present Ext: warm and well perfused, trace pitting edema bilaterally. Erythema, increased warmth of the right ankle with wound in right malleolus covered with eschar. Left lateral foot with 5mm full thickeness minimum ss output.  Neuro: alert and oriented to person, year, month, and location, cranial nerves II-XII grossly intact, moves all extremities voluntarily.  Legs: Feet:Chronic wounds of the left lateral sole of foot with 5 mm full thickness breakdown and right foots lateral malleolus. No obvious bone exposure no drainage    Lab Results: Basic Metabolic Panel:  Recent Labs Lab 02/09/13 0555  02/12/13 0722 02/13/13 0520  NA 140  < > 135 137  K 4.3  < > 3.5 3.8  CL 96  < > 95* 97  CO2 25  < > 29 25  GLUCOSE 149*  < > 149* 143*  BUN 37*  < > 17 24*  CREATININE 6.77*  < > 4.57* 5.57*  CALCIUM 9.8  < > 9.6 9.8  PHOS 4.9*  --   --   --   < > = values in this interval not displayed. Liver Function Tests: CBC:  Recent Labs Lab 02/12/13 0722 02/13/13 0520  WBC 12.1* 12.0*  NEUTROABS 9.7* 9.7*  HGB 13.0 13.1  HCT 39.3 40.5  MCV  96.3 97.6  PLT 238 236   CBG:  Recent Labs Lab 02/11/13 2012 02/12/13 0746 02/12/13 1202 02/12/13 1645 02/12/13 2105 02/13/13 0735  GLUCAP 153* 128* 149* 133* 135* 126*   Hemoglobin A1C: No results found for this basename: HGBA1C,  in the last 168 hours Coagulation:  Recent Labs Lab 02/10/13 1115 02/11/13 0510 02/12/13 0722 02/13/13 0520  LABPROT 17.5* 17.9* 16.1* 15.9*  INR 1.48 1.52* 1.32 1.30    Studies/Results: No results found. Medications: I have reviewed the patient's current medications. Scheduled Meds: . amiodarone  200 mg Oral BID  . calcium acetate  2,001 mg Oral TID WC  .  ceFAZolin (ANCEF) IV  2 g Intravenous On Call to OR  . collagenase   Topical Daily  . feeding supplement (RESOURCE BREEZE)  1 Container Oral TID WC  . insulin aspart  0-9 Units Subcutaneous TID WC  . magic mouthwash  5 mL Oral TID  . metoprolol  100 mg Oral BID  . multivitamin  1 tablet Oral QHS  . pantoprazole  40 mg Oral Daily  . piperacillin-tazobactam (ZOSYN)  IV  2.25 g Intravenous Q8H  .  sodium chloride  3 mL Intravenous Q12H  . sodium chloride  3 mL Intravenous Q12H  . vancomycin  1,000 mg Intravenous Q M,W,F-HD   Continuous Infusions: . heparin 1,300 Units/hr (02/12/13 0850)   PRN Meds:.sodium chloride, sodium chloride, sodium chloride, acetaminophen, acetaminophen, alteplase, feeding supplement (NEPRO CARB STEADY), heparin, lidocaine (PF), lidocaine-prilocaine, ondansetron (ZOFRAN) IV, pentafluoroprop-tetrafluoroeth, sodium chloride Assessment/Plan: Mr. Hafley is a 72 year old man with PMH of A.fib (on coumadin, BB), ESRD on HD, DM2, presenting with encephalopathy and sepsis due to bilateral foot ulcers.   Sepsis 2/2 bil foot wounds and right ankle cellulitis (Wound Bil Feet)  The patient has bilateral feet wound concerning for cellulitis and possible deep infection including OM. Given tachycardia, AMS and leukocytosis at presentation, this represented severe sepsis that  has now resolved. MRI bl feet demonstrated possible OM of left 5th digit, myositis of right foot and left foot. No evidence of septic joint at this time. Diffuse cellulitis is present. The cellulitis has improved with IV abx.  - Continue Vanc Zosyn IV per pharm  - Consulted Ortho for eval and treatment (Dr. Ranell Patrick) who rec continuation of ABX and discharge with outpatient f/u with Dr. Lajoyce Corners. Dr. Lajoyce Corners rec Santyl and ABI with possible vasc surgery eval. Pt may require surgical debridement during hospitalization. Dr. Lajoyce Corners will continue to follow. Dr. Hart Rochester with vascular surgery plans to perform arteriography on Monday once INR is <1.6 due to concern about vascular insufficiency of the foot. Arteriography completed on 12/15. Dr. 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 After discussing the risk of not having the procedure yesterday, the patient decided that he was willing to have his right foot amputated. His brother was present while we talked. This morning the patient agrees with the plan to have his right BKA. He request a chaplain to see him. I have ordered consult to spiritual care.  ESRD c/b Hyperkalemia - (resolved). He had missed 2 hemodialysis sessions the week prior to his presentation with K of 6.9 that has neem now stabilized after emergent HD.  - HD per nephrology  Acute Encephalopathy  Etiology of patients acute encephalopathy is likely multifactorial. Likely causes include UTI, sepsis, bil foot infection, uremia, drug side effect, and baseline dementia.  Mental status has much improved ans is essentially at baseline per patients borther. The patient continues to have intermittent episodes of confusion and agitation worse at night.  - Patient appears to not tolerate opiates after he becomes altered with even low dose Vicodin.  Plan to use tylenol prn.  SVT (Afib with RVR)  Patients SVT is of unknown etiology (likely afib with RVR) at this time. Patient reports not taking his rate  control medications in the two days before admission. Likely he has a history of SVT with RVR that is rate controlled with an atypical regimen in metoprolol 50 mg BID and labetalol 100 mg bid. At admission, patient was given Metoprolol 50 mg BID which achieved prompt rate control. On 02/02/13 the patient refused his rate control meds. That night he became tachycardic to 140's. In the AM, he was agreeable to taking his HR meds and HR was less than 110. On 12/11 his HR continued to be >110, therefore consulted Cardiology (Dr. Mayford Knife is patients cardiologist) who increased metoprolol to 100 mg BID and rec starting amio if this does control the rate. Amio was started on 12/12. HR mostly under 110 since that time.  Rib fractures  Multiple rib fractures. Patient is  breathing comfortably. Will hold narcotics given AMS and administer tylenol prn. No signs of paradoxical chest wall movement at this time. Thus, no concern for flail chest. Of note, binders are contraindicated as they may increase risk of respiratory complications. His pain is not well controlled with Tylenol alone and he is at risk of acute delirium 2/2 pain.  -Incentive spirometry  Coumadin Treatment  -He is on coumadin Therapy for Afib as outpatient. Initially, patients INR was >10, but recheck showed INR 1.32. Thus the initial was likely a lab error. Patient was started on coumadin per pharmacy. He became therapeutic. Once, Vasc surgery recommended arteriography, coumadin was held.  Last dose was on 02/03/13. May consider reversal with Vit K as INR has continued to trend up (3.92) on 02/06/13. Vit K given on 12/12. INR 1.77 on 12/13. Plan to restart coumadin once vascular/ortho surgery gives rec's regarding need for operative management of wounds.  DM II  Home lantus dose is 58 U qhs. Decreased to 40 U while inpatient. SSI sensitive. CBG qhc and hs. Hg A1C of 6.5%. As aptient has had inconsistent PO intake of food, lantus has been held.  --  SSI  SW  Patient was brought from home that is filled with trash, excrement, dirty diapers and rodents. Son feels that house needs to be condemned. Patient will likely need SNF placement for rehab.  - Consult SW  DVT PPx: Hep gtt  Dispo: Disposition is deferred at this time, awaiting improvement of current medical problems.   The patient does have a current PCP Aida Puffer, MD) and does not need an [facility] hospital follow-up appointment after discharge.  The patient does have transportation limitations that hinder transportation to clinic appointments.  .Services Needed at time of discharge: Y = Yes, Blank = No PT: Pending eval  OT: Pending eval  RN:   Equipment: Pending eval  Other:     LOS: 14 days   Pleas Koch, MD 02/13/2013, 11:35 AM

## 2013-02-13 NOTE — Progress Notes (Signed)
Patient in Dialysis

## 2013-02-13 NOTE — Anesthesia Preprocedure Evaluation (Signed)
Anesthesia Evaluation  Patient identified by MRN, date of birth, ID band Patient awake    Reviewed: Allergy & Precautions, H&P , Patient's Chart, lab work & pertinent test results  Airway  TM Distance: >3 FB Neck ROM: Full    Dental  (+) Poor Dentition and Dental Advisory Given   Pulmonary Current Smoker,  breath sounds clear to auscultation        Cardiovascular hypertension, Pt. on medications + dysrhythmias (A fib) Atrial Fibrillation Rhythm:Regular Rate:Normal     Neuro/Psych    GI/Hepatic   Endo/Other  diabetes, Well Controlled, Type 2, Insulin Dependent  Renal/GU Dialysis and ESRFRenal disease     Musculoskeletal   Abdominal   Peds  Hematology   Anesthesia Other Findings   Reproductive/Obstetrics                           Anesthesia Physical Anesthesia Plan  ASA: III  Anesthesia Plan: General   Post-op Pain Management:    Induction: Intravenous  Airway Management Planned: LMA  Additional Equipment:   Intra-op Plan:   Post-operative Plan: Extubation in OR  Informed Consent: I have reviewed the patients History and Physical, chart, labs and discussed the procedure including the risks, benefits and alternatives for the proposed anesthesia with the patient or authorized representative who has indicated his/her understanding and acceptance.   Dental advisory given  Plan Discussed with: CRNA, Anesthesiologist and Surgeon  Anesthesia Plan Comments:         Anesthesia Quick Evaluation

## 2013-02-13 NOTE — Progress Notes (Signed)
Subjective:  Seen on dialysis, slightly confused, but no complaints  Objective: Vital signs in last 24 hours: Temp:  [97.4 F (36.3 C)-98.1 F (36.7 C)] 98.1 F (36.7 C) (12/19 0745) Pulse Rate:  [63-97] 92 (12/19 1100) Resp:  [17-28] 22 (12/19 1100) BP: (127-160)/(68-83) 127/83 mmHg (12/19 1100) SpO2:  [93 %-97 %] 97 % (12/19 0745) Weight:  [76.5 kg (168 lb 10.4 oz)] 76.5 kg (168 lb 10.4 oz) (12/19 0745) Weight change:   Intake/Output from previous day: 12/18 0701 - 12/19 0700 In: 300 [P.O.:300] Out: -    Lab Results:  Recent Labs  02/12/13 0722 02/13/13 0520  WBC 12.1* 12.0*  HGB 13.0 13.1  HCT 39.3 40.5  PLT 238 236   BMET:  Recent Labs  02/12/13 0722 02/13/13 0520  NA 135 137  K 3.5 3.8  CL 95* 97  CO2 29 25  GLUCOSE 149* 143*  BUN 17 24*  CREATININE 4.57* 5.57*  CALCIUM 9.6 9.8   No results found for this basename: PTH,  in the last 72 hours Iron Studies: No results found for this basename: IRON, TIBC, TRANSFERRIN, FERRITIN,  in the last 72 hours  EXAM: General appearance:  Alert, talkative, but confused, no distress Resp:  CTA without rales, rhonchi, or wheezes Cardio:  Irregularly irregular, no murmur or rub GI: + BS, soft and nontender Extremities:  No edema Access:  AVF @ RFA w/ BFR 400  Dialysis: [facility]  3.75hrs F180 450/1.5 81kg 3K/2.25Ca Prof 4 R AVF Heparin 7800  Hectorol 3 (held 12/13 for Cleon Dew)  Assessment/Plan: 1. Osteomyelitis & gangrene of R ankle - BKA today per Dr. Lajoyce Corners, on Vancomycin & Zosyn.  2. AMS - poor social situation, requiring SNF placement. 3. ESRD - HD on MWF @ [facility]; K 3.8. 4. HTN/Volume - BP 127/83 on Metoprolol 100 mg bid; wt 76.5 kg with UF goal 2 L. 5. Anemia - Hgb 13.1, no Epogen or Fe. 6. 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 7. Nutrition - renal diet and vitamin.  8. A-fib with RVR - per cardiology, on Amiodarone & BB.   LOS: 14 days   LYLES,[place] 02/13/2013,11:31 AM  I have seen and  examined patient, discussed with PA and agree with assessment and plan as outlined above. Vinson Moselle MD pager [phone]    cell [phone] 02/13/2013, 12:04 PM

## 2013-02-14 DIAGNOSIS — S88119A Complete traumatic amputation at level between knee and ankle, unspecified lower leg, initial encounter: Secondary | ICD-10-CM

## 2013-02-14 LAB — CBC WITH DIFFERENTIAL/PLATELET
Basophils Absolute: 0 10*3/uL (ref 0.0–0.1)
Eosinophils Relative: 2 % (ref 0–5)
HCT: 41.2 % (ref 39.0–52.0)
Hemoglobin: 13 g/dL (ref 13.0–17.0)
Lymphs Abs: 1.5 10*3/uL (ref 0.7–4.0)
MCH: 31.4 pg (ref 26.0–34.0)
MCV: 99.5 fL (ref 78.0–100.0)
Monocytes Absolute: 0.7 10*3/uL (ref 0.1–1.0)
Monocytes Relative: 5 % (ref 3–12)
Neutro Abs: 12.3 10*3/uL — ABNORMAL HIGH (ref 1.7–7.7)
RBC: 4.14 MIL/uL — ABNORMAL LOW (ref 4.22–5.81)
RDW: 19 % — ABNORMAL HIGH (ref 11.5–15.5)
WBC: 14.8 10*3/uL — ABNORMAL HIGH (ref 4.0–10.5)

## 2013-02-14 LAB — GLUCOSE, CAPILLARY
Glucose-Capillary: 121 mg/dL — ABNORMAL HIGH (ref 70–99)
Glucose-Capillary: 148 mg/dL — ABNORMAL HIGH (ref 70–99)
Glucose-Capillary: 68 mg/dL — ABNORMAL LOW (ref 70–99)
Glucose-Capillary: 100 mg/dL — ABNORMAL HIGH (ref 70–99)
Glucose-Capillary: 129 mg/dL — ABNORMAL HIGH (ref 70–99)

## 2013-02-14 LAB — BASIC METABOLIC PANEL
BUN: 14 mg/dL (ref 6–23)
CO2: 25 mEq/L (ref 19–32)
CO2: 26 mEq/L (ref 19–32)
Calcium: 9.1 mg/dL (ref 8.4–10.5)
Chloride: 96 mEq/L (ref 96–112)
Creatinine, Ser: 4.02 mg/dL — ABNORMAL HIGH (ref 0.50–1.35)
GFR calc Af Amer: 16 mL/min — ABNORMAL LOW (ref >90)
GFR calc Af Amer: 13 mL/min — ABNORMAL LOW (ref >90)
GFR calc non Af Amer: 11 mL/min — ABNORMAL LOW (ref >90)
Glucose, Bld: 117 mg/dL — ABNORMAL HIGH (ref 70–99)
Glucose, Bld: 81 mg/dL (ref 70–99)
Potassium: 4.2 mEq/L (ref 3.5–5.1)
Sodium: 136 mEq/L (ref 135–145)

## 2013-02-14 MED ORDER — INSULIN ASPART 100 UNIT/ML IV SOLN
10.0000 [IU] | Freq: Once | INTRAVENOUS | Status: AC
  Administered 2013-02-14: 10 [IU] via INTRAVENOUS
  Filled 2013-02-14: qty 0.1

## 2013-02-14 MED ORDER — VANCOMYCIN HCL IN DEXTROSE 1-5 GM/200ML-% IV SOLN: 1000.0000 mg | Freq: Once | INTRAVENOUS | Status: DC

## 2013-02-14 MED ORDER — CAMPHOR-MENTHOL 0.5-0.5 % EX LOTN
TOPICAL_LOTION | CUTANEOUS | Status: DC | PRN
  Filled 2013-02-14: qty 222

## 2013-02-14 MED ORDER — DEXTROSE 50 % IV SOLN
1.0000 | Freq: Once | INTRAVENOUS | Status: AC
  Administered 2013-02-14: 50 mL via INTRAVENOUS
  Filled 2013-02-14: qty 50

## 2013-02-14 NOTE — Progress Notes (Signed)
  Date: 02/14/2013  Patient name: Chris Vance  Medical record number: [id number]  Date of birth: 1940-10-28   This patient has been seen and the plan of care was discussed with the house staff. Dr. Jorje Guild note is pending, however, we discussed the plan going forward.   Chris Vance is post op right BKA yesterday, is alert and doing well this morning.  His labs revealed a K of 5.5, he is not due for dialysis today, but is reported to be on the schedule for tomorrow.  This will be managed with medications and close monitoring, EKG is ordered.  Patient to remain on IV Abx for now as he also has an infectious process ongoing in LLE.    Chris Catalina, MD 02/14/2013, 11:58 AM

## 2013-02-14 NOTE — Progress Notes (Signed)
Subjective:  No complaints, comfortable, wants to eat, pain controlled  Objective: Vital signs in last 24 hours: Temp:  [97.3 F (36.3 C)-97.8 F (36.6 C)] 97.3 F (36.3 C) (12/20 0435) Pulse Rate:  [80-120] 90 (12/20 0435) Resp:  [15-22] 18 (12/20 0435) BP: (102-149)/(59-97) 135/59 mmHg (12/20 0435) SpO2:  [96 %-100 %] 99 % (12/20 0435) Weight:  [74.5 kg (164 lb 3.9 oz)] 74.5 kg (164 lb 3.9 oz) (12/19 1140) Weight change:   Intake/Output from previous day: 12/19 0701 - 12/20 0700 In: 300 [I.V.:300] Out: 2046    Lab Results:  Recent Labs  02/13/13 0520 02/14/13 0508  WBC 12.0* 14.8*  HGB 13.1 13.0  HCT 40.5 41.2  PLT 236 267   BMET:  Recent Labs  02/13/13 0520 02/14/13 0508  NA 137 136  K 3.8 5.5*  CL 97 96  CO2 25 25  GLUCOSE 143* 117*  BUN 24* 14  CREATININE 5.57* 4.02*  CALCIUM 9.8 9.3   No results found for this basename: PTH,  in the last 72 hours Iron Studies: No results found for this basename: IRON, TIBC, TRANSFERRIN, FERRITIN,  in the last 72 hours  EXAM:  General appearance: Alert, talkative, no apparent distress  Resp: CTA without rales, rhonchi, or wheezes  Cardio: Irregularly irregular, no murmur or rub  GI: + BS, soft and nontender  Extremities:  R BKA wrapped, no edema  Access: AVF @ RFA with + bruit  Dialysis: [facility]  3.75hrs F180 450/1.5 81kg 3K/2.25Ca Prof 4 R AVF Heparin 7800  Hectorol 3 (held 12/13 for Cleon Dew)  Assessment/Plan: 1. Osteomyelitis & gangrene of R ankle - s/p BKA yesterday per Dr. Lajoyce Corners, WBCs up to 14.8, on Vancomycin & Zosyn.  2. AMS - poor social situation, requiring SNF placement.  3. ESRD - HD on MWF @ [facility]; K 3.8.  Next HD tomorrow per holiday schedule. 4. HTN/Volume - BP 135/59 on Metoprolol 100 mg bid; wt 74.5 kg.  Establish new EDW s/p BKA. Euvolemic today, new dry wt around 74.5kg  5. Anemia - Hgb 13 s/p surgery, no Epogen or Fe.  6. Sec HPT - Ca 9.3; no Hectorol, Phoslo 3 with meals.  7. Nutrition - renal  diet and vitamin.  8. A-fib with RVR - per cardiology, on Amiodarone & BB.    LOS: 15 days   LYLES,[place] 02/14/2013,8:04 AM  I have seen and examined patient, discussed with PA and agree with assessment and plan as outlined above.  Looks better after BKA, perhaps MS will improve some with removal of infectious focus.  HD tomorrow, holiday schedule Vinson Moselle MD pager [phone]    cell [phone] 02/14/2013, 10:47 AM

## 2013-02-14 NOTE — Progress Notes (Signed)
Subjective: No complaints this morning, states that he is learning to get used to the pain. His appetite is improved and he has eaten all his breakfast. Denies chest pain, shortnessof breath, or confusion.    Objective: Vital signs in last 24 hours: Filed Vitals:   02/13/13 2130 02/13/13 2200 02/13/13 2300 02/14/13 0435  BP: 133/79 134/59 134/66 135/59  Pulse: 100 98 95 90  Temp:    97.3 F (36.3 C)  TempSrc:    Oral  Resp:    18  Height:      Weight:      SpO2:    99%   Weight change:   Intake/Output Summary (Last 24 hours) at 02/14/13 1042 Last data filed at 02/13/13 1700  Gross per 24 hour  Intake    300 ml  Output   2046 ml  Net  -1746 ml   Vitals reviewed. General: Sitting up in bed, in NAD, the most lucid and articulate I have seen him since his admission HEENT: no scleral icterus Cardiac: RRR, no rubs, or gallops, 2/3 SEM heard best at the RUSB Pulm: bibasilar lung crackles, no wheezes, rales, or rhonchi Abd: soft, nontender, nondistended, BS present Ext: Left leg warm and wall perfused, Right BKA with stump covered in dressing that is c/d/i Neuro: alert and oriented X3, conversant, following commands, no facial droop  Lab Results: Basic Metabolic Panel:  Recent Labs Lab 02/09/13 0555  02/13/13 0520 02/14/13 0508  NA 140  < > 137 136  K 4.3  < > 3.8 5.5*  CL 96  < > 97 96  CO2 25  < > 25 25  GLUCOSE 149*  < > 143* 117*  BUN 37*  < > 24* 14  CREATININE 6.77*  < > 5.57* 4.02*  CALCIUM 9.8  < > 9.8 9.3  PHOS 4.9*  --   --   --   < > = values in this interval not displayed. LCBC:  Recent Labs Lab 02/13/13 0520 02/14/13 0508  WBC 12.0* 14.8*  NEUTROABS 9.7* 12.3*  HGB 13.1 13.0  HCT 40.5 41.2  MCV 97.6 99.5  PLT 236 267   CBG:  Recent Labs Lab 02/12/13 2105 02/13/13 0735 02/13/13 1434 02/13/13 1741 02/13/13 2049 02/14/13 0755  GLUCAP 135* 126* 112* 113* 116* 121*   Coagulation:  Recent Labs Lab 02/11/13 0510 02/12/13 0722  02/13/13 0520 02/14/13 0508  LABPROT 17.9* 16.1* 15.9* 15.2  INR 1.52* 1.32 1.30 1.23   Medications: I have reviewed the patient's current medications. Scheduled Meds: . amiodarone  200 mg Oral BID  . calcium acetate  2,001 mg Oral TID WC  . collagenase   Topical Daily  . feeding supplement (RESOURCE BREEZE)  1 Container Oral TID WC  . insulin aspart  0-9 Units Subcutaneous TID WC  . magic mouthwash  5 mL Oral TID  . metoprolol  100 mg Oral BID  . multivitamin  1 tablet Oral QHS  . pantoprazole  40 mg Oral Daily  . piperacillin-tazobactam (ZOSYN)  IV  2.25 g Intravenous Q8H  . sodium chloride  3 mL Intravenous Q12H  . vancomycin  1,000 mg Intravenous Q M,W,F-HD   Continuous Infusions:  PRN Meds:.sodium chloride, acetaminophen, acetaminophen, HYDROmorphone (DILAUDID) injection, metoCLOPramide (REGLAN) injection, metoCLOPramide, ondansetron (ZOFRAN) IV, oxyCODONE-acetaminophen, sodium chloride Assessment/Plan: Mr. Bastin is a 72 year old man with PMH of A.fib (on coumadin, BB), ESRD on HD, DM2, presenting with encephalopathy and sepsis due to bilateral foot ulcers.  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 Presented with  bilateral foot wounds with cellulitis and possible deep infection including OM. Given tachycardia, AMS and leukocytosis at presentation, this represented severe sepsis that has now resolved. MRI bl feet demonstrated possible OM of left 5th digit, myositis of right foot and left foot. No evidence of septic joint at this time. Diffuse cellulitis was present and improved somewhat with IV antibiotics (Vanc and Zosyn) and he had remained afebrile. Orthopedic Surgery and Vascular Surgery were consulted.   Bilateral LE arteriography completed on 12/15 revealed occlusion of the right superficial femoral and popliteal arteries with some flow distantly but raised concern for a poor prognosis of wound healing with partial  amputation or even debridement  of his right ankle wound. Dr. Lajoyce Corners, in Orthopedic Surgery, recommended right BKA given the patient's severe arterial disease. Dr. Lajoyce Corners and the primary team had extensive discussion with the patient and his family (his brother) about the risks and benefits of this surgical intervention and the patient agreed to proceed with BKA which he underwent on 12/19 with no complications.  - Continue Vanc Zosyn IV per pharm  - Orthopedic Surgery following, appreciate recommendations in regards to surgical wound care and left foot wound care, weight baring status and anticoagulation timing (on heparin drip, needs to be on coumadin as outpatient)  ESRD c/b Hyperkalemia - Intermittent with missed HD, post-op. K of 5.5 today.  - HD per Nephrology, scheduled for tomorrow.  -Insulin 10 unit IV with 1 amp of dextrose (will avoid albuterol inh to prevent tachycardia, no kayexalate per pt's preference)  Acute Encephalopathy -Resolved. Likely multifactorial, due to UTI, sepsis, bil foot infection, uremia, drug side effect, and baseline dementia. Mental status has much improved ans is essentially at baseline per patient's borther. The patient continues to have intermittent episodes of confusion and agitation worse at night, likely sundowning.  - Patient appears to not tolerate opiates after he becomes altered with even low dose Vicodin. Plan to use tylenol prn.   A. Fib - He had A. Fib with with RVR with rate >110 that resolved on 12/13. He had not taken his BB two days before admission that promptly resolved with his home metoprolol 50mg  but returned the next day. He was on unusual rate control/BB regimen at home with metoprolol 50 mg BID and labetalol 100 mg BID. We discontinued his home labetalol but continued metoprolol. On 02/02/13 the patient refused his rate control meds. That night he became tachycardic to 140's. In the morning he was agreeable to taking his HR meds and HR was less than  110. On 12/11 his HR continued to be >110, therefore, we consulted Cardiology (Dr. Mayford Knife is his primary Cardiologist) who increased metoprolol to 100 mg BID and recommended initiation of amiodarone if needed. He remained in A. Fib with RVR and amiodarone PO was started on 12/12. Of note, he was initially on warfarin but this was held for his arteriography and he was placed on heparin drip while in A. Fib with RVR. Heparin drip was discontinued on 12/18. He remains in A. Fib HR mostly under 110 since that time.  -Continue metoprolol 100mg  BID -Continue amiodarone 200mg  BID  Rib fractures- Multiple rib fractures. Patient is breathing comfortably. Will hold narcotics given AMS and administer tylenol prn. No signs of paradoxical chest wall movement at this time. Thus, no concern for flail chest. Of note, binders are contraindicated as they  may increase risk of respiratory complications. His pain is not well controlled with Tylenol alone and he is at risk of acute delirium 2/2 pain.  -Incentive spirometry   Coumadin Treatment -He is on coumadin Therapy for Afib as outpatient. Initially, patients INR was >10, but recheck showed INR 1.32. Thus the initial was likely a lab error. Patient was started on coumadin per pharmacy. He became therapeutic. Once, Vasc surgery recommended arteriography, coumadin was held. Last dose was on 02/03/13. May consider reversal with Vit K as INR has continued to trend up (3.92) on 02/06/13. Vit K given on 12/12. INR 1.77 on 12/13. Plan to restart coumadin once vascular/ortho surgery gives rec's regarding need for operative management of wounds.   DM type 2- Home lantus dose is 58 U qhs. Decreased to 40 U while inpatient. SSI sensitive. CBG q AC and HS. Hg A1C of 6.5%. As aptient has had inconsistent PO intake of food, lantus has been held.  -- SSI   Social issues - Patient was brought from home that is filled with trash, excrement, dirty diapers and rodents. Son feels that house  needs to be condemned. Patient will likely need SNF placement for rehab.  - Consult SW   DVT PPx: SCDs to right leg, no heparin or Lovenox per Ortho at this time to prevent surgical wound bleeding.     Dispo: Disposition is deferred at this time, awaiting improvement of current medical problems.    The patient does have a current PCP Aida Puffer, MD) and does not need an [facility] hospital follow-up appointment after discharge.  The patient does have transportation limitations that hinder transportation to clinic appointments.  .Services Needed at time of discharge: Y = Yes, Blank = No PT:   OT:   RN:   Equipment:   Other:     LOS: 15 days   Ky Barban, MD 02/14/2013, 10:42 AM

## 2013-02-14 NOTE — Anesthesia Postprocedure Evaluation (Signed)
Anesthesia Post Note  Patient: Chris Vance  Procedure(s) Performed: Procedure(s) (LRB): Right Below Knee Amputation (Right)  Anesthesia type: General  Patient location: PACU  Post pain: Pain level controlled  Post assessment: Patient's Cardiovascular Status Stable  Last Vitals:  Filed Vitals:   02/14/13 0435  BP: 135/59  Pulse: 90  Temp: 36.3 C  Resp: 18    Post vital signs: Reviewed and stable  Level of consciousness: alert  Complications: No apparent anesthesia complications

## 2013-02-14 NOTE — Progress Notes (Signed)
Subjective: 1 Day Post-Op Procedure(s) (LRB): Right Below Knee Amputation (Right) Patient reports pain as mild.    Objective: Vital signs in last 24 hours: Temp:  [97.3 F (36.3 C)-97.9 F (36.6 C)] 97.9 F (36.6 C) (12/20 1158) Pulse Rate:  [76-120] 76 (12/20 1158) Resp:  [15-20] 18 (12/20 1158) BP: (102-140)/(59-97) 112/63 mmHg (12/20 1158) SpO2:  [96 %-100 %] 98 % (12/20 1158)  Intake/Output from previous day: 12/19 0701 - 12/20 0700 In: 300 [I.V.:300] Out: 2046  Intake/Output this shift:     Recent Labs  02/12/13 0722 02/13/13 0520 02/14/13 0508  HGB 13.0 13.1 13.0    Recent Labs  02/13/13 0520 02/14/13 0508  WBC 12.0* 14.8*  RBC 4.15* 4.14*  HCT 40.5 41.2  PLT 236 267    Recent Labs  02/13/13 0520 02/14/13 0508  NA 137 136  K 3.8 5.5*  CL 97 96  CO2 25 25  BUN 24* 14  CREATININE 5.57* 4.02*  GLUCOSE 143* 117*  CALCIUM 9.8 9.3    Recent Labs  02/13/13 0520 02/14/13 0508  INR 1.30 1.23    dressing dry  Assessment/Plan: 1 Day Post-Op Procedure(s) (LRB): Right Below Knee Amputation (Right) Up with therapy.  Eating lunch. Minimal pain.   Cordel Drewes C 02/14/2013, 12:01 PM

## 2013-02-15 LAB — BASIC METABOLIC PANEL
BUN: 23 mg/dL (ref 6–23)
CO2: 25 mEq/L (ref 19–32)
Chloride: 96 mEq/L (ref 96–112)
Creatinine, Ser: 5.26 mg/dL — ABNORMAL HIGH (ref 0.50–1.35)
GFR calc Af Amer: 11 mL/min — ABNORMAL LOW (ref >90)
Glucose, Bld: 134 mg/dL — ABNORMAL HIGH (ref 70–99)

## 2013-02-15 LAB — PROTIME-INR: INR: 1.3 (ref 0.00–1.49)

## 2013-02-15 LAB — CBC WITH DIFFERENTIAL/PLATELET
Basophils Absolute: 0.1 10*3/uL (ref 0.0–0.1)
Basophils Relative: 1 % (ref 0–1)
HCT: 41 % (ref 39.0–52.0)
Lymphocytes Relative: 11 % — ABNORMAL LOW (ref 12–46)
Lymphs Abs: 1.4 10*3/uL (ref 0.7–4.0)
MCH: 31.3 pg (ref 26.0–34.0)
MCV: 98.8 fL (ref 78.0–100.0)
Monocytes Absolute: 0.8 10*3/uL (ref 0.1–1.0)
Monocytes Relative: 6 % (ref 3–12)
Neutro Abs: 9.9 10*3/uL — ABNORMAL HIGH (ref 1.7–7.7)
RBC: 4.15 MIL/uL — ABNORMAL LOW (ref 4.22–5.81)
RDW: 19.1 % — ABNORMAL HIGH (ref 11.5–15.5)
WBC: 12.5 10*3/uL — ABNORMAL HIGH (ref 4.0–10.5)

## 2013-02-15 LAB — GLUCOSE, CAPILLARY
Glucose-Capillary: 146 mg/dL — ABNORMAL HIGH (ref 70–99)
Glucose-Capillary: 108 mg/dL — ABNORMAL HIGH (ref 70–99)
Glucose-Capillary: 166 mg/dL — ABNORMAL HIGH (ref 70–99)
Glucose-Capillary: 149 mg/dL — ABNORMAL HIGH (ref 70–99)

## 2013-02-15 MED ORDER — ALTEPLASE 2 MG IJ SOLR: 2.0000 mg | Freq: Once | INTRAMUSCULAR | Status: DC | PRN

## 2013-02-15 MED ORDER — LIDOCAINE HCL (PF) 1 % IJ SOLN: 5.0000 mL | INTRAMUSCULAR | Status: DC | PRN

## 2013-02-15 MED ORDER — LIDOCAINE HCL (PF) 1 % IJ SOLN
5.0000 mL | INTRAMUSCULAR | Status: DC | PRN
  Filled 2013-02-15: qty 5

## 2013-02-15 MED ORDER — HEPARIN SODIUM (PORCINE) 1000 UNIT/ML DIALYSIS
1000.0000 [IU] | INTRAMUSCULAR | Status: DC | PRN
  Filled 2013-02-15: qty 1

## 2013-02-15 MED ORDER — ONDANSETRON HCL 4 MG PO TABS: 4.0000 mg | ORAL_TABLET | Freq: Four times a day (QID) | ORAL | Status: DC | PRN

## 2013-02-15 MED ORDER — LIDOCAINE-PRILOCAINE 2.5-2.5 % EX CREA
1.0000 | TOPICAL_CREAM | CUTANEOUS | Status: DC | PRN
Start: 2013-02-15 — End: 2013-02-17

## 2013-02-15 MED ORDER — OXYCODONE-ACETAMINOPHEN 5-325 MG PO TABS
1.0000 | ORAL_TABLET | ORAL | Status: DC | PRN
  Administered 2013-02-16 – 2013-02-17 (×4): 1 via ORAL
  Filled 2013-02-15 (×2): qty 1

## 2013-02-15 MED ORDER — HEPARIN SODIUM (PORCINE) 1000 UNIT/ML DIALYSIS: 1000.0000 [IU] | INTRAMUSCULAR | Status: DC | PRN

## 2013-02-15 MED ORDER — ALTEPLASE 2 MG IJ SOLR
2.0000 mg | Freq: Once | INTRAMUSCULAR | Status: AC | PRN
  Filled 2013-02-15: qty 2

## 2013-02-15 MED ORDER — SODIUM CHLORIDE 0.9 % IV SOLN: 100.0000 mL | INTRAVENOUS | Status: DC | PRN

## 2013-02-15 MED ORDER — LIDOCAINE-PRILOCAINE 2.5-2.5 % EX CREA: 1.0000 "application " | TOPICAL_CREAM | CUTANEOUS | Status: DC | PRN

## 2013-02-15 MED ORDER — ONDANSETRON HCL 4 MG/2ML IJ SOLN: 4.0000 mg | Freq: Four times a day (QID) | INTRAMUSCULAR | Status: DC | PRN

## 2013-02-15 MED ORDER — NEPRO/CARBSTEADY PO LIQD
237.0000 mL | ORAL | Status: DC | PRN
  Filled 2013-02-15: qty 237

## 2013-02-15 MED ORDER — PENTAFLUOROPROP-TETRAFLUOROETH EX AERO: 1.0000 "application " | INHALATION_SPRAY | CUTANEOUS | Status: DC | PRN

## 2013-02-15 MED ORDER — NEPRO/CARBSTEADY PO LIQD: 237.0000 mL | ORAL | Status: DC | PRN

## 2013-02-15 MED ORDER — HEPARIN SODIUM (PORCINE) 1000 UNIT/ML DIALYSIS
1500.0000 [IU] | INTRAMUSCULAR | Status: DC | PRN
  Filled 2013-02-15: qty 2

## 2013-02-15 MED ORDER — PENTAFLUOROPROP-TETRAFLUOROETH EX AERO
1.0000 | INHALATION_SPRAY | CUTANEOUS | Status: DC | PRN
Start: 2013-02-15 — End: 2013-02-17

## 2013-02-15 NOTE — Progress Notes (Signed)
Subjective:  Pain at right BKA stump after bumping side of bed, no other complaints, but initially refusing to go to dialysis.  Objective: Vital signs in last 24 hours: Temp:  [97.4 F (36.3 C)-98.1 F (36.7 C)] 97.8 F (36.6 C) (12/21 0602) Pulse Rate:  [76-98] 78 (12/21 0602) Resp:  [16-20] 18 (12/21 0602) BP: (105-125)/(63-74) 125/66 mmHg (12/21 0602) SpO2:  [95 %-98 %] 95 % (12/21 0602) Weight change:   Intake/Output from previous day: 12/20 0701 - 12/21 0700 In: 360 [P.O.:360] Out: -    Lab Results:  Recent Labs  02/14/13 0508 02/15/13 0500  WBC 14.8* 12.5*  HGB 13.0 13.0  HCT 41.2 41.0  PLT 267 229   BMET:  Recent Labs  02/14/13 1650 02/15/13 0500  NA 136 138  K 4.2 4.4  CL 97 96  CO2 26 25  GLUCOSE 81 134*  BUN 19 23  CREATININE 4.68* 5.26*  CALCIUM 9.1 9.3   No results found for this basename: PTH,  in the last 72 hours Iron Studies: No results found for this basename: IRON, TIBC, TRANSFERRIN, FERRITIN,  in the last 72 hours  EXAM:  General appearance: Alert, in no apparent distress  Resp: CTA without rales, rhonchi, or wheezes  Cardio: Irregularly irregular, no murmur or rub  GI: + BS, soft and nontender  Extremities: R BKA wrapped, no edema  Access: AVF @ RFA with + bruit   D[facility]: [facility]  3.75hrs F180 450/1.5 81kg 3K/2.25Ca Prof 4 R AVF Heparin 7800  Hectorol 3 (held 12/13 for Cleon Dew)  Assessment/Plan: 1. 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 Lajoyce Corners, WBCs 12.5 (down from 14.8), on Vancomycin & Zosyn.  2. AMS - poor social situation, requiring SNF placement.  3. ESRD - HD on MWF @ [facility]; K 4.4. HD today per holiday schedule.  4. HTN/Volume - BP 125/66 on Metoprolol 100 mg bid; wt 74.5 kg, likely new EDW s/p BKA.  5. Anemia - Hgb 13 s/p surgery, no Epogen or Fe.  6. 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  7. Nutrition - renal diet and vitamin.  8. A-fib with RVR - per cardiology, on Amiodarone & BB.     LOS: 16 days   LYLES,Trong 02/15/2013,7:48 AM  I have seen and examined patient, discussed with PA and agree with assessment and plan as outlined above.  Remains stable after BKA on 12/19.  If no further surgery planned, primary team planning d/c to SNF soon.  HD today (holiday schedule).  Vinson Moselle MD pager [phone]    cell [phone] 02/15/2013, 1:23 PM

## 2013-02-15 NOTE — Progress Notes (Signed)
Subjective:  Patient had some confusion this AM. He is in pain as he struck the side of the bed with his stump. He has refused HD. I spoke with the patient about the need for HD given his high potassium yesterday, The patient agreed with the plan for HD later today. He wants to eat breakfast before  HD.  Objective: Vital signs in last 24 hours: Filed Vitals:   02/14/13 1158 02/14/13 1800 02/14/13 2030 02/15/13 0602  BP: 112/63 105/67 116/74 125/66  Pulse: 76 80 98 78  Temp: 97.9 F (36.6 C) 98.1 F (36.7 C) 97.4 F (36.3 C) 97.8 F (36.6 C)  TempSrc: Oral Oral Oral Oral  Resp: 18 20 16 18   Height:      Weight:      SpO2: 98% 95% 96% 95%   Weight change:   Intake/Output Summary (Last 24 hours) at 02/15/13 0719 Last data filed at 02/14/13 1800  Gross per 24 hour  Intake    360 ml  Output      0 ml  Net    360 ml   Vitals reviewed. General: Sitting up in bed, in NAD, A+O x 3 but sleepy  HEENT: no scleral icterus Cardiac: RRR, no rubs, or gallops, 2/3 SEM heard best at the RUSB Pulm: bibasilar lung crackles, no wheezes, rales, or rhonchi Abd: soft, nontender, nondistended, BS present Ext: Left leg warm and wall perfused, Right BKA with stump covered in dressing that is c/d/i Neuro: alert and oriented X3, conversant, following commands, no facial droop  Lab Results: Basic Metabolic Panel:  Recent Labs Lab 02/09/13 0555  02/14/13 1650 02/15/13 0500  NA 140  < > 136 138  K 4.3  < > 4.2 4.4  CL 96  < > 97 96  CO2 25  < > 26 25  GLUCOSE 149*  < > 81 134*  BUN 37*  < > 19 23  CREATININE 6.77*  < > 4.68* 5.26*  CALCIUM 9.8  < > 9.1 9.3  PHOS 4.9*  --   --   --   < > = values in this interval not displayed. LCBC:  Recent Labs Lab 02/14/13 0508 02/15/13 0500  WBC 14.8* 12.5*  NEUTROABS 12.3* 9.9*  HGB 13.0 13.0  HCT 41.2 41.0  MCV 99.5 98.8  PLT 267 229   CBG:  Recent Labs Lab 02/13/13 2049 02/14/13 0755 02/14/13 1141 02/14/13 1650 02/14/13 1816  02/14/13 2047  GLUCAP 116* 121* 148* 68* 100* 129*   Coagulation:  Recent Labs Lab 02/12/13 0722 02/13/13 0520 02/14/13 0508 02/15/13 0500  LABPROT 16.1* 15.9* 15.2 15.9*  INR 1.32 1.30 1.23 1.30   Medications: I have reviewed the patient's current medications. Scheduled Meds: . amiodarone  200 mg Oral BID  . calcium acetate  2,001 mg Oral TID WC  . collagenase   Topical Daily  . feeding supplement (RESOURCE BREEZE)  1 Container Oral TID WC  . insulin aspart  0-9 Units Subcutaneous TID WC  . magic mouthwash  5 mL Oral TID  . metoprolol  100 mg Oral BID  . multivitamin  1 tablet Oral QHS  . pantoprazole  40 mg Oral Daily  . piperacillin-tazobactam (ZOSYN)  IV  2.25 g Intravenous Q8H  . sodium chloride  3 mL Intravenous Q12H  . vancomycin  1,000 mg Intravenous Once in dialysis   Continuous Infusions:  PRN Meds:.sodium chloride, sodium chloride, sodium chloride, acetaminophen, acetaminophen, alteplase, camphor-menthol, feeding supplement (NEPRO CARB STEADY), heparin, [  START ON 02/16/2013] heparin, lidocaine (PF), lidocaine-prilocaine, metoCLOPramide (REGLAN) injection, metoCLOPramide, ondansetron (ZOFRAN) IV, ondansetron, oxyCODONE-acetaminophen, pentafluoroprop-tetrafluoroeth, sodium chloride Assessment/Plan: Mr. Conran is a 72 year old man with PMH of A.fib (on coumadin, BB), ESRD on HD, DM2, presenting with encephalopathy and sepsis due to bilateral foot ulcers.   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 Presented with  bilateral foot wounds with cellulitis and possible deep infection including OM. Given tachycardia, AMS and leukocytosis at presentation, this represented severe sepsis that has now resolved. MRI bl feet demonstrated possible OM of left 5th digit, myositis of right foot and left foot. No evidence of septic joint. Diffuse cellulitis was present and improved somewhat with IV antibiotics (Vanc and Zosyn) and he  had remained afebrile. Orthopedic Surgery and Vascular Surgery were consulted.   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 Dr. Lajoyce Corners, in Orthopedic Surgery, recommended right BKA given the patient's severe arterial disease. Dr. Lajoyce Corners and the primary team had extensive discussion with the patient and his family (his brother) about the risks and benefits of this surgical intervention and the patient agreed to proceed with BKA which he underwent on 12/19 with no complications.  - Continue Vanc Zosyn IV per pharm  - Orthopedic Surgery following, appreciate recommendations in regards to surgical wound care and left foot wound care, weight baring status and anticoagulation timing (on heparin drip, needs to be on coumadin as outpatient)  ESRD c/b Hyperkalemia - Intermittent hyperkalemia  with missed HD.  - HD per Nephrology, scheduled for tomorrow.   Acute Encephalopathy -Resolved. Likely multifactorial, due to UTI, sepsis, bil foot infection, uremia, drug side effect, and baseline dementia. Mental status has much improved ans is essentially at baseline per patient's borther. The patient continues to have intermittent episodes of confusion and agitation worse at night, likely sundowning.   A. Fib - He had A. Fib with with RVR with rate >110 that resolved on 12/13. He had not taken his BB two days before admission that promptly resolved with his home metoprolol 50mg  but returned the next day. He was on unusual rate control/BB regimen at home with metoprolol 50 mg BID and labetalol 100 mg BID. We discontinued his home labetalol but continued metoprolol. On 02/02/13 the patient refused his rate control meds. That night he became tachycardic to 140's. In the morning he was agreeable to  taking his HR meds and HR was less than 110. On 12/11 his HR continued to be >110, therefore, we consulted Cardiology (Dr. Mayford Knife is his primary Cardiologist) who increased metoprolol to 100 mg BID and recommended initiation of amiodarone if needed. He remained in A. Fib with RVR and amiodarone PO was started on 12/12. Of note, he was initially on warfarin but this was held for his arteriography and he was placed on heparin drip while in A. Fib with RVR. Heparin drip was discontinued on 12/18. He remains in A. Fib HR mostly under 110 since that time.  -Continue metoprolol 100mg  BID -Continue amiodarone 200mg  BID  Rib fractures- Multiple rib fractures. Patient is breathing comfortably. Will hold narcotics given AMS and administer tylenol prn. No signs of paradoxical chest wall movement at this time. Thus, no concern for flail chest. Of note, binders are contraindicated  as they may increase risk of respiratory complications. His pain is not well controlled with Tylenol alone and he is at risk of acute delirium 2/2 pain.  -Incentive spirometry   Coumadin Treatment -He is on coumadin Therapy for Afib as outpatient. Initially, patients INR was >10, but recheck showed INR 1.32. Thus the initial was likely a lab error. Patient was started on coumadin per pharmacy. He became therapeutic. Once, Vasc surgery recommended arteriography, coumadin was held. Last dose was on 02/03/13. May consider reversal with Vit K as INR has continued to trend up (3.92) on 02/06/13. Vit K given on 12/12. INR 1.77 on 12/13. Plan to restart coumadin once vascular/ortho surgery gives rec's regarding need for operative management of wounds.   DM type 2- Home lantus dose is 58 U qhs. Decreased to 40 U while inpatient. SSI sensitive. CBG q AC and HS. Hg A1C of 6.5%. As aptient has had inconsistent PO intake of food, lantus has been held.  -- SSI   Social issues - Patient was brought from home that is filled with trash, excrement, dirty  diapers and rodents. Son feels that house needs to be condemned. Patient will likely need SNF placement for rehab.  - Consult SW   DVT PPx: SCDs to right leg, no heparin or Lovenox per Ortho at this time to prevent surgical wound bleeding.     Dispo: Disposition is deferred at this time, awaiting improvement of current medical problems.    The patient does have a current PCP Aida Puffer, MD) and does not need an [facility] hospital follow-up appointment after discharge.  The patient does have transportation limitations that hinder transportation to clinic appointments.  .Services Needed at time of discharge: Y = Yes, Blank = No PT:   OT:   RN:   Equipment:   Other:     LOS: 16 days   Pleas Koch, MD 02/15/2013, 7:19 AM

## 2013-02-15 NOTE — Progress Notes (Signed)
  Date: 02/15/2013  Patient name: Chris Vance  Medical record number: [id number]  Date of birth: 05/09/1940   This patient has been seen and the plan of care was discussed with the house staff. Please see their note for complete details. I concur with their findings with the following additions/corrections:  Pending decisions for this patient include course of IV therapy with Abx (WBC trending down), management of left foot wounds/osteo (on IV Abx since 12/6 for a course of 16 days now, does he need further surgery?), re initiation of coumadin post surgery and placement likely to a SNF.  Will plan for discharge early this week if no further surgery is needed.   Inez Catalina, MD 02/15/2013, 10:09 AM

## 2013-02-16 DIAGNOSIS — A419 Sepsis, unspecified organism: Secondary | ICD-10-CM

## 2013-02-16 DIAGNOSIS — Z89511 Acquired absence of right leg below knee: Secondary | ICD-10-CM

## 2013-02-16 DIAGNOSIS — I739 Peripheral vascular disease, unspecified: Secondary | ICD-10-CM | POA: Diagnosis present

## 2013-02-16 DIAGNOSIS — N2581 Secondary hyperparathyroidism of renal origin: Secondary | ICD-10-CM | POA: Diagnosis present

## 2013-02-16 DIAGNOSIS — D631 Anemia in chronic kidney disease: Secondary | ICD-10-CM | POA: Diagnosis present

## 2013-02-16 LAB — CBC WITH DIFFERENTIAL/PLATELET
Basophils Absolute: 0.1 10*3/uL (ref 0.0–0.1)
Eosinophils Absolute: 0.3 10*3/uL (ref 0.0–0.7)
Eosinophils Relative: 2 % (ref 0–5)
Hemoglobin: 12.8 g/dL — ABNORMAL LOW (ref 13.0–17.0)
Lymphs Abs: 1.4 10*3/uL (ref 0.7–4.0)
MCH: 31.7 pg (ref 26.0–34.0)
MCHC: 32.1 g/dL (ref 30.0–36.0)
Monocytes Absolute: 0.7 10*3/uL (ref 0.1–1.0)
Neutrophils Relative %: 82 % — ABNORMAL HIGH (ref 43–77)
Platelets: 235 10*3/uL (ref 150–400)
RBC: 4.04 MIL/uL — ABNORMAL LOW (ref 4.22–5.81)
RDW: 18.8 % — ABNORMAL HIGH (ref 11.5–15.5)

## 2013-02-16 LAB — GLUCOSE, CAPILLARY
Glucose-Capillary: 112 mg/dL — ABNORMAL HIGH (ref 70–99)
Glucose-Capillary: 171 mg/dL — ABNORMAL HIGH (ref 70–99)

## 2013-02-16 LAB — BASIC METABOLIC PANEL
Calcium: 9.3 mg/dL (ref 8.4–10.5)
Creatinine, Ser: 6.69 mg/dL — ABNORMAL HIGH (ref 0.50–1.35)
GFR calc Af Amer: 9 mL/min — ABNORMAL LOW (ref >90)
GFR calc non Af Amer: 7 mL/min — ABNORMAL LOW (ref >90)
Glucose, Bld: 141 mg/dL — ABNORMAL HIGH (ref 70–99)

## 2013-02-16 LAB — PROTIME-INR: Prothrombin Time: 18.4 seconds — ABNORMAL HIGH (ref 11.6–15.2)

## 2013-02-16 MED ORDER — LIDOCAINE HCL (PF) 1 % IJ SOLN: 5.0000 mL | INTRAMUSCULAR | Status: DC | PRN

## 2013-02-16 MED ORDER — OXYCODONE-ACETAMINOPHEN 5-325 MG PO TABS
ORAL_TABLET | ORAL | Status: AC
  Filled 2013-02-16: qty 1

## 2013-02-16 MED ORDER — ACETAMINOPHEN 325 MG PO TABS: 650.0000 mg | ORAL_TABLET | Freq: Four times a day (QID) | ORAL | Status: DC | PRN

## 2013-02-16 MED ORDER — HYDROCODONE-ACETAMINOPHEN 5-325 MG PO TABS: 1.0000 | ORAL_TABLET | Freq: Four times a day (QID) | ORAL | Status: DC | PRN

## 2013-02-16 MED ORDER — METOPROLOL TARTRATE 25 MG PO TABS
25.0000 mg | ORAL_TABLET | Freq: Two times a day (BID) | ORAL | Status: DC
  Filled 2013-02-16 (×4): qty 1

## 2013-02-16 MED ORDER — HEPARIN SODIUM (PORCINE) 1000 UNIT/ML DIALYSIS: 1000.0000 [IU] | INTRAMUSCULAR | Status: DC | PRN

## 2013-02-16 MED ORDER — AMIODARONE HCL 200 MG PO TABS: 200.0000 mg | ORAL_TABLET | Freq: Two times a day (BID) | ORAL | Status: DC

## 2013-02-16 MED ORDER — DOXYCYCLINE HYCLATE 100 MG PO TABS
100.0000 mg | ORAL_TABLET | Freq: Two times a day (BID) | ORAL | Status: DC
  Administered 2013-02-16 – 2013-02-17 (×2): 100 mg via ORAL
  Filled 2013-02-16 (×4): qty 1

## 2013-02-16 MED ORDER — WARFARIN SODIUM 2.5 MG PO TABS
2.5000 mg | ORAL_TABLET | Freq: Once | ORAL | Status: AC
  Administered 2013-02-16: 2.5 mg via ORAL
  Filled 2013-02-16: qty 1

## 2013-02-16 MED ORDER — PENTAFLUOROPROP-TETRAFLUOROETH EX AERO: 1.0000 "application " | INHALATION_SPRAY | CUTANEOUS | Status: DC | PRN

## 2013-02-16 MED ORDER — NEPRO/CARBSTEADY PO LIQD: 237.0000 mL | ORAL | Status: DC | PRN

## 2013-02-16 MED ORDER — DOXYCYCLINE HYCLATE 100 MG PO TABS: 100.0000 mg | ORAL_TABLET | Freq: Two times a day (BID) | ORAL | Status: DC

## 2013-02-16 MED ORDER — LIDOCAINE-PRILOCAINE 2.5-2.5 % EX CREA
1.0000 "application " | TOPICAL_CREAM | CUTANEOUS | Status: DC | PRN
  Filled 2013-02-16: qty 5

## 2013-02-16 MED ORDER — ALTEPLASE 2 MG IJ SOLR: 2.0000 mg | Freq: Once | INTRAMUSCULAR | Status: AC | PRN

## 2013-02-16 MED ORDER — SODIUM CHLORIDE 0.9 % IV SOLN: 100.0000 mL | INTRAVENOUS | Status: DC | PRN

## 2013-02-16 MED ORDER — METOPROLOL TARTRATE 100 MG PO TABS: 100.0000 mg | ORAL_TABLET | Freq: Two times a day (BID) | ORAL | Status: DC

## 2013-02-16 MED ORDER — WARFARIN - PHARMACIST DOSING INPATIENT: Freq: Every day | Status: DC

## 2013-02-16 MED ORDER — CAMPHOR-MENTHOL 0.5-0.5 % EX LOTN: TOPICAL_LOTION | CUTANEOUS | Status: DC | PRN

## 2013-02-16 MED ORDER — BOOST / RESOURCE BREEZE PO LIQD: 1.0000 | Freq: Three times a day (TID) | ORAL | Status: DC

## 2013-02-16 NOTE — Progress Notes (Signed)
  Date: 02/16/2013  Patient name: Chris Vance  Medical record number: [id number]  Date of birth: 1940/10/23   This patient has been seen and the plan of care was discussed with the house staff. Please see their note for complete details. I concur with their findings with the following additions/corrections:  Mr. Schliep is doing well s/p his RLE BKA.  His mental status issues are resolved.  He will be transitioned to oral Abx for 4 weeks course for probable left 5th toe osteomyelitis.  SNF placement is pending, he has bed requests today and will need to decide on an appropriate place.  Anticipate possible d/c today.  Coumadin has been restarted per pharmacy for his Afib.    Inez Catalina, MD 02/16/2013, 10:44 AM

## 2013-02-16 NOTE — Progress Notes (Signed)
PT Cancellation Note  Patient Details Name: Chris Vance MRN: [id number] DOB: 01-13-1941   Cancelled Treatment:    Reason Eval/Treat Not Completed: Patient at procedure or test/unavailable.  Pt at dialysis.   Lexton Hidalgo LUBECK 02/16/2013, 2:34 PM

## 2013-02-16 NOTE — Progress Notes (Signed)
Clinical Child psychotherapist (CSW) spoke with patient, patient's son [name] and patient's brother Molly Maduro who reported that [facility] was their first choice. CSW contacted both admissions coordinators at [facility], Revonda Standard and Lanora Manis who reported that per their administrator "they are unable to meet the patient's needs at this time." Per CSW [place] family was agreeable to [facility]. CSW left 2 messages for [facility] admissions coordinator at [facility]. CSW will follow up with Melissa on Tuesday 02/17/13 and continue to assist with D/C .   Jetta Lout, LCSWA Weekend CSW [phone]

## 2013-02-16 NOTE — Procedures (Signed)
I was present at this session.  I have reviewed the session itself and made appropriate changes. HD via RLA af.  Access press ok.  bp stable to start  Jenissa Tyrell L 12/22/201411:20 AM

## 2013-02-16 NOTE — Progress Notes (Signed)
OT Cancellation Note  Patient Details Name: Chris Vance MRN: [id number] DOB: 1940-08-31   Cancelled Treatment:    Reason Eval/Treat Not Completed: Patient at procedure or test/ unavailable--pt in hemodialysis and may D/C post this.  Evette Georges [phone] 02/16/2013, 3:20 PM

## 2013-02-16 NOTE — Progress Notes (Signed)
Subjective:  No complaints, refused dialysis yesterday, will run today.Main concern cream on arm  Objective: Vital signs in last 24 hours: Temp:  [97.4 F (36.3 C)-98 F (36.7 C)] 97.4 F (36.3 C) (12/22 0545) Pulse Rate:  [80-109] 80 (12/22 0545) Resp:  [18] 18 (12/22 0545) BP: (103-124)/(59-66) 112/66 mmHg (12/22 0545) SpO2:  [93 %-96 %] 96 % (12/22 0545) Weight change:      Lab Results:  Recent Labs  02/15/13 0500 02/16/13 0545  WBC 12.5* 13.5*  HGB 13.0 12.8*  HCT 41.0 39.9  PLT 229 235   BMET:  Recent Labs  02/15/13 0500 02/16/13 0545  NA 138 137  K 4.4 4.3  CL 96 97  CO2 25 27  GLUCOSE 134* 141*  BUN 23 31*  CREATININE 5.26* 6.69*  CALCIUM 9.3 9.3   No results found for this basename: PTH,  in the last 72 hours Iron Studies: No results found for this basename: IRON, TIBC, TRANSFERRIN, FERRITIN,  in the last 72 hours  EXAM:  General appearance: Alert, in no apparent distress Ox1 Resp: CTA without rales, rhonchi, or wheezes diminished bs Cardio: Irregularly irregular, Gr2/6 M murmur or rub  GI: + BS, soft and nontender Liver down 6 cm Extremities: R BKA wrapped, no edema  Access: AVF @ RFA with + bruit  Confused.  Dialysis: [facility]  3.75hrs F180 450/1.5 81kg 3K/2.25Ca Prof 4 R AVF Heparin 7800  Hectorol 3 (held 12/13 for Chris Vance)  Assessment/Plan: 1. 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 Lajoyce Vance, WBCs up to 13.5, afebrile, on Vancomycin & Zosyn.  2. AMS - poor social situation, requiring SNF placement.  3. ESRD - HD on MWF @ [facility]; K 4.3, refused HD yesterday. HD today and again tomorrow per holiday schedule. 4. HTN/Volume - BP 112/66 on Metoprolol 100 mg bid; wt 74.5 kg, likely new EDW s/p BKA. Lower metoprolol with lower bp, cont amio 5. Anemia - Hgb 13.5 s/p surgery, no Epogen or Fe.  6. Sec HPT - Ca 9.3; no Hectorol, Phoslo 3 with meals.  7. Nutrition - renal diet and vitamin.  8. A-fib with RVR - per cardiology, on Amiodarone &  BB.questionable to use Coumadin     LOS: 17 days   Vance,Chris 02/16/2013,8:30 AM I have seen and examined this patient and agree with the plan of care seen, examined, orders and not amended .  Chris Vance L 02/16/2013, 10:45 AM

## 2013-02-16 NOTE — Discharge Summary (Signed)
Name: Chris Vance MRN: [id number] DOB: 12/13/1940 72 y.o. PCP: Aida Puffer, MD  Date of Admission: 01/30/2013  2:31 PM Date of Discharge: 02/16/2013 Attending Physician: Burns Spain, MD  Discharge Diagnosis:  Principal Problem:   Sepsis Active Problems:   Anemia   Hyperlipidemia   End stage renal disease   Hyperkalemia   Wound of right ankle   Open wound of left foot   SVT (supraventricular tachycardia)   Multiple rib fractures  Discharge Medications:   Medication List    STOP taking these medications       labetalol 200 MG tablet  Commonly known as:  NORMODYNE      TAKE these medications       acetaminophen 325 MG tablet  Commonly known as:  TYLENOL  Take 2 tablets (650 mg total) by mouth every 6 (six) hours as needed for mild pain or moderate pain (or Fever >/= 101).     amiodarone 200 MG tablet  Commonly known as:  PACERONE  Take 1 tablet (200 mg total) by mouth 2 (two) times daily.     calcium acetate (Phos Binder) 667 MG/5ML Soln  Commonly known as:  PHOSLYRA  3 tablets daily with meals     camphor-menthol lotion  Commonly known as:  SARNA  Apply topically as needed for itching.     doxycycline 100 MG tablet  Commonly known as:  VIBRA-TABS  Take 1 tablet (100 mg total) by mouth every 12 (twelve) hours. For 28 days     feeding supplement (RESOURCE BREEZE) Liqd  Take 1 Container by mouth 3 (three) times daily with meals.     gabapentin 300 MG capsule  Commonly known as:  NEURONTIN  Take 300 mg by mouth at bedtime.     HYDROcodone-acetaminophen 5-325 MG per tablet  Commonly known as:  NORCO/VICODIN  Take 1 tablet by mouth every 6 (six) hours as needed for moderate pain or severe pain.     insulin glargine 100 UNIT/ML injection  Commonly known as:  LANTUS  Inject 58 Units into the skin at bedtime.     lidocaine-prilocaine cream  Commonly known as:  EMLA  Apply 1 application topically 3 (three) times a week. Dialysis med     metoprolol 50 MG tablet  Commonly known as:  LOPRESSOR  Take 50 mg by mouth 2 (two) times daily.     omeprazole 20 MG capsule  Commonly known as:  PRILOSEC  Take 20 mg by mouth daily.     warfarin 5 MG tablet  Commonly known as:  COUMADIN  Take 5 mg by mouth daily.        Disposition and follow-up:   [name] was discharged from [facility] in Stable condition.  At the hospital follow up visit please address:  1.  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  Labs / imaging needed at time of follow-up: INR, BMP,   3.  Pending labs/ test needing follow-up: None.  Follow-up Appointments: Follow-up Information   Follow up with Nadara Mustard, MD On 03/03/2013. (1:00 pm)    Specialty:  Orthopedic Surgery   Contact information:   [address] [place] [address] [phone]       Follow up with Christen Bame, MD On 03/05/2013. (1:45 pm)    Specialty:  Internal Medicine   Contact information:   [address] [place] [address]  [phone]       Follow up with Gar Ponto, [facility] On 02/23/2013. (2:00 pm)    Specialty:  Pharmacist   Contact information:   [address] [place] [address] [phone]       Discharge Instructions: Discharge Orders   Future Appointments Provider Department Dept Phone   03/05/2013 1:45 PM Christen Bame, MD [facility] [phone]   Future Orders Complete By Expires   Call MD for:  difficulty breathing, headache or visual disturbances  As directed    Call MD for:  extreme fatigue  As directed    Call MD for:  persistant dizziness or light-headedness  As directed    Call MD for:  redness, tenderness, or signs of infection (pain, swelling, redness, odor or green/yellow discharge around incision site)  As directed    Call MD for:  severe uncontrolled pain  As directed    Diet - low sodium heart healthy  As directed     Discharge instructions  As directed    Comments:     Patient has been treated for a severe right sided wound with below the knee amputation. He has possible osteomyelitis of the left 5th toe for which he will be treated with doxycycline 100 mg twice daily for the next 28 days.   Increase activity slowly  As directed       Consultations: Treatment Team:  Sadie Haber, MD Maree Krabbe, MD Nadara Mustard, MD [facility], MD Quintella Reichert, MD  Procedures Performed:  Dg Chest 2 View  01/29/2013   CLINICAL DATA:  Status post fall with right-sided pain  EXAM: CHEST  2 VIEW  COMPARISON:  June 05, 2012  FINDINGS: The lungs are hyperinflated. There is mild increased pulmonary interstitium. There is no pleural effusion or focal pneumonia. The heart size is enlarged. The aorta is tortuous. The visualized soft tissues and bones demonstrate no acute abnormality.  IMPRESSION: Cardiomegaly.  Mild interstitial edema.   Electronically Signed   By: Sherian Rein M.D.   On: 01/29/2013 14:13   Dg Ribs Bilateral W/chest  01/30/2013   CLINICAL DATA:  Fall.  Bilateral rib pain and chest pain  EXAM: BILATERAL RIBS AND CHEST - 4+ VIEW  COMPARISON:  01/29/2013  FINDINGS: Moderate cardiac enlargement. No pleural effusion or edema identified. No airspace consolidation. Chronic interstitial coarsening is identified within the lungs. There is calcified atherosclerotic disease involving the thoracic aorta. There is an acute fracture involving the right 4th rib laterally. Anterior 5th, 6th and 7th rib fractures are also noted. There is no evidence of pneumothorax or pleural effusion.  IMPRESSION: Acute right 4th, 5th, 6th, and 7th rib fractures.   Electronically Signed   By: Signa Kell M.D.   On: 01/30/2013 16:51   Dg Ankle Complete Right  01/31/2013   CLINICAL DATA:  Swelling.  Soft tissue ulcer.  EXAM: RIGHT ANKLE - COMPLETE 3+ VIEW  COMPARISON:  None.  FINDINGS: There is moderate arthritis of the  ankle joint. No ankle joint effusion. No evidence of bone destruction to suggest osteomyelitis. Extensive arterial vascular calcifications present in the soft tissues.  IMPRESSION: No acute osseous abnormality. Specifically, no evidence of osteomyelitis. Moderate arthritis of the ankle joint.   Electronically Signed   By: Geanie Cooley M.D.   On: 01/31/2013 08:24   Ct Head Wo Contrast  01/30/2013   CLINICAL DATA:  Fall.  EXAM: CT HEAD WITHOUT CONTRAST  TECHNIQUE: Contiguous  axial images were obtained from the base of the skull through the vertex without intravenous contrast.  COMPARISON:  11/29/2010.  FINDINGS: No mass. No hydrocephalus. No hemorrhage. Mild periventricular white matter changes consistent wrist chronic ischemia again noted. Diffuse cerebral cerebellar atrophy again noted. Cerebral vascular calcifications present. Orbits are unremarkable. Minimal mucosal thickening of the paranasal sinuses. Mastoids are clear. No acute bony abnormality. Stable left maxillary bony cyst, most likely odontogenic, no change from prior exam.  IMPRESSION: Stable exam with stable changes of chronic white matter ischemia and cerebral atrophy. No acute abnormality.   Electronically Signed   By: Maisie Fus  Register   On: 01/30/2013 16:42   Mr Foot Right Wo Contrast  02/01/2013   CLINICAL DATA:  Right foot pain and swelling.  EXAM: MRI OF THE RIGHT FOREFOOT WITHOUT CONTRAST  TECHNIQUE: Multiplanar, multisequence MR imaging was performed. No intravenous contrast was administered.  COMPARISON:  Radiographs 01/31/2013.  FINDINGS: Examination is quite limited due to patient motion. There is severe cellulitic change with marked subcutaneous soft tissue swelling/ edema and fluid. No discrete drainable abscess is identified. There is diffuse fatty change involving the foot muscles and mild diffuse myositis. Moderate degenerative changes involving the forefoot but no definite MR findings for osteomyelitis or septic arthritis. There is  a remote healed 5th metatarsal fracture noted.  IMPRESSION: Diffuse cellulitis and mild myositis but no definite MR findings for osteomyelitis or septic arthritis.   Electronically Signed   By: Loralie Champagne M.D.   On: 02/01/2013 15:34   Mr Foot Left Wo Contrast  02/01/2013   CLINICAL DATA:  Left foot pain.  Possible fracture.  EXAM: MRI OF THE LEFT FOREFOOT WITHOUT CONTRAST  TECHNIQUE: Multiplanar, multisequence MR imaging was performed. No intravenous contrast was administered.  COMPARISON:  Radiographs 01/31/2013.  FINDINGS: Very limited examination due to patient motion. There is diffuse cellulitic change and mild myositis. Diffuse fatty change involving the foot musculature.  There appears to be soft tissue thickening and mild edema around the lateral aspect of the 5th metatarsal phalangeal joint. There is some signal abnormality in the base of the 5th proximal phalanx. Could not exclude osteomyelitis. No findings to suggest septic arthritis.  No definite acute 4th metatarsal fracture.  IMPRESSION: 1. Limited examination due to motion. 2. Diffuse cellulitis and mild myositis. No discrete soft tissue abscess. 3. Signal abnormality in the 5th proximal phalanx could be artifact but could not exclude osteomyelitis.   Electronically Signed   By: Loralie Champagne M.D.   On: 02/01/2013 15:14   Mr Ankle Right  Wo Contrast  02/01/2013   CLINICAL DATA:  Lateral ankle ulcer.  EXAM: MRI OF THE RIGHT ANKLE WITHOUT CONTRAST  TECHNIQUE: Multiplanar, multisequence MR imaging of the ankle was performed. No intravenous contrast was administered.  COMPARISON:  Radiographs 01/31/2013.  FINDINGS: Diffuse subcutaneous soft tissue swelling/ edema/fluid suggesting cellulitis without definite discrete drainable soft tissue abscess. No MR findings to suggest septic arthritis or osteomyelitis. Diffuse muscle edema suggesting nonspecific myositis.  Degenerative changes are noted at the tibiotalar joint and involving the subtalar  joints and midfoot.  IMPRESSION:  Significant cellulitic change and myositis but no definite MR findings for focal soft tissue abscess, septic arthritis or osteomyelitis involving the ankle.   Electronically Signed   By: Loralie Champagne M.D.   On: 02/01/2013 15:06   Dg Foot 2 Views Left  01/31/2013   CLINICAL DATA:  Ulceration along the medial side of the ankle.  EXAM: LEFT FOOT - 2 VIEW  COMPARISON:  None.  FINDINGS: Vascular calcifications noted. Irregularity in the proximal metaphysis of the 4th metatarsal. Linear calcific density projecting plantar to the distal 4th metatarsal may represent a foreign body or dense focal linear calcification in the plantar soft tissues. There is deformity of the shaft of the proximal phalanx 2nd toe, query old fracture.  No malalignment at the Lisfranc joint. Accessory navicular noted. Dorsal midfoot spurring. Plantar and Achilles calcaneal spurs. Poor definition of the subtalar joint and anterior calcaneus on the lateral projection due to obliquity. Probable tibiotalar joint effusion. No definite bony destructive findings characteristic of osteomyelitis.  IMPRESSION: 1. Today's exam focused on the foot rather than the ankle. There is some irregularity in the proximal metaphysis of the 4th metatarsal which could represent a fracture ; oblique view might help to further assess this. 2. 6 mm linear calcific density in the plantar soft tissues below the distal 4th metatarsal, suspicious for foreign body. 3. Suspected old fracture of the proximal phalanx 2nd toe. 4. Plantar and Achilles calcaneal spurs with dorsal spurring at the Lisfranc joint. 5. Vascular calcifications. 6. Difficult to assess the hindfoot on the lateral projection due to obliquity. CT or MRI may be required for comprehensive assessment.   Electronically Signed   By: Herbie Baltimore M.D.   On: 01/31/2013 08:24   Ir Shuntogram/ Fistulagram Right Mod Sed  02/03/2013   CLINICAL DATA:  Reduced access flow via  right forearm dialysis AV fistula.  EXAM: DIALYSIS AV FISTULOGRAM  CONTRAST:  65mL OMNIPAQUE IOHEXOL 300 MG/ML  SOLN  COMPARISON:  None.  FLUOROSCOPY TIME:  36 seconds.  ACCESS: If there remains decrease in flow rates, vascular surgical consultation is recommended for potential aneurysm resection/plication.  PROCEDURE: The procedure, risks, benefits, and alternatives were explained to the patient. Questions regarding the procedure were encouraged and answered. The patient understands and consents to the procedure.  The right arm native fistula was prepped with Betadine in a sterile fashion, and a sterile drape was applied covering the operative field. A diagnostic fistulogram was performed via an 18 gauge angiocatheter introduced into venous outflow. Venous drainage was assessed to the level of the central veins in the chest. Proximal fistula was studied by reflux maneuver with temporary compression of venous outflow. After the procedure, the angiocatheter was removed and hemostasis obtained with manual compression.  COMPLICATIONS: None  FINDINGS: A patent right arm Cimino fistula is present. 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 The rest of the cephalic vein shows normal patency. At the antecubital fossa, there is equal competing venous outflow a in the cephalic vein and the deep venous system. Central veins are normally patent including the SVC.  IMPRESSION: Patent right arm Cimino fistula. 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 This may be contributing to reduced overall access flow in the fistula. Competing venous outflow was noted in the cephalic vein and deep venous system at the level of the upper arm.   Electronically Signed   By: Irish Lack M.D.   On: 02/03/2013 17:13   Admission HPI: Chris Vance is a 72 y.o. man with a pmhx detailed below who comes to the ED by EMS.  He was in his normal state of health until earlier this week when he fell at [employer] ([place] He struck the ground with this right side and had severe right sided chest pain. He was seen  in the ED and was discharged on Vicodin for pain control (CXR negative for acute process). On Th he went to dialysis center thinking it was Fr. Today, he missed dialysis. Police were sent to patients house by son, who forced open the door. The house was filled with trash rats, and fleas. The patient was found confused and surrounded by dirty diapers. The patient said he was too weak to get up. He was extracted from his home and brought to the ED. In the ED. K 6.9 and tachy to 150's with irregular rhythm, INR > 10. Repeat CXR showed 5 right sided rib fractures. IM consulted for admission.  Right sided chest pain +. Bilateral foot wounds. LE swelling+.   Hospital Course by problem list: Principal Problem:   Sepsis Active Problems:   Anemia   Hyperlipidemia   End stage renal disease   Hyperkalemia   Wound of right ankle   Open wound of left foot   SVT (supraventricular tachycardia)   Multiple rib fractures   Chris Vance is a 72 year old man with PMH of A.fib (on coumadin, BB), ESRD on HD, DM2, presenting with encephalopathy and sepsis due to bilateral foot ulcers.   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 Patient presented with bilateral foot wounds complicated by cellulitis and possible deep infection including OM. Given tachycardia, AMS and leukocytosis at presentation, this represented severe sepsis that later resolved. MRI bl feet demonstrated possible OM of left 5th digit, myositis of right foot and left foot. No evidence of septic joint. Diffuse cellulitis was present and improved with IV antibiotics (Vanc and Zosyn). Orthopedic Surgery and Vascular Surgery were consulted.  Bilateral LE arteriography completed on 12/15 revealed occlusion of the right  superficial femoral and popliteal arteries with some flow distantly but raised concern for a poor prognosis of wound healing with partial amputation or even debridement of his right ankle wound. Dr. Lajoyce Corners, in Orthopedic Surgery, recommended right BKA given the patient's severe arterial disease. Dr. Lajoyce Corners and the primary team had extensive discussion with the patient and his family about the risks and benefits of this surgical intervention and the patient agreed to proceed with BKA which he underwent on 12/19 with no complications. Orthopedic Surgery following, recommended doxy 100 mg BID for four weeks for possible osteo of left 5th digit. For pain control the patient was discharged with 60 tablets of vicodin to use as needed every 6 hours.   ESRD c/b Hyperkalemia - Intermittent hyperkalemia with missed HD.  - HD per Nephrology  Acute Encephalopathy -Resolved. Likely multifactorial, due to UTI, sepsis, bil foot infection, uremia, drug side effect, and baseline dementia. Mental status has much improved ans is essentially at baseline per patient's borther. The patient continues to have intermittent episodes of confusion and agitation worse at night, likely sundowning in the setting of chronic mild to moderate dementia.   A. Fib - He had A. Fib with with RVR with rate >110 that resolved on 12/13. He had not taken his BB two days before admission that promptly resolved with his home metoprolol 50mg  but returned the next day. He was on unusual rate control/BB regimen at home with metoprolol 50 mg BID and labetalol 100 mg BID. We discontinued his home labetalol but continued metoprolol. On 02/02/13 the patient refused his rate control meds. That night he became tachycardic to 140's. In the morning he was agreeable to taking his HR meds and  HR was less than 110. On 12/11 his HR continued to be >110, therefore, we consulted Cardiology (Dr. Mayford Knife is his primary Cardiologist) who increased metoprolol to 100 mg BID and  recommended initiation of amiodarone if needed. He remained in A. Fib with RVR and amiodarone PO was started on 12/12. Of note, he was initially on warfarin but this was held for his arteriography and he was placed on heparin drip while in A. Fib with RVR. Heparin drip was discontinued on 12/18. He remains in A. Fib HR mostly under 110 since that time. Discharged patient on metoprolol 100mg  BID and amiodarone 200mg  BID.  Rib fractures- Multiple rib fractures. Patient is breathing comfortably. Will hold narcotics given AMS and administer tylenol prn. No signs of paradoxical chest wall movement at this time. Thus, no concern for flail chest. Of note, binders are contraindicated as they may increase risk of respiratory complications.   Coumadin Treatment -He is on coumadin Therapy for Afib as outpatient. Initially, patients INR was >10, but recheck showed INR 1.32. Thus the initial was likely a lab error. Patient was started on coumadin per pharmacy. He became therapeutic. Once, Vasc surgery recommended arteriography, coumadin was held. Last dose was on 02/03/13. May consider reversal with Vit K as INR has continued to trend up (3.92) on 02/06/13. Vit K given on 12/12. INR 1.77 on 12/13. Coumadin restarted on 02/16/13.  DM type 2- Home lantus dose is 58 U qhs. Decreased to 40 U while inpatient. SSI sensitive. CBG q AC and HS. Hg A1C of 6.5%. As patient has had inconsistent PO intake of food, lantus has been held.   Social issues - Patient was brought from home that is filled with trash, excrement, dirty diapers and rodents. Son feels that house needs to be condemned. Patient will likely need SNF placement for rehab. His home should be assessed before discharging to home.   Discharge Vitals:   BP 112/66  Pulse 80  Temp(Src) 97.4 F (36.3 C) (Oral)  Resp 18  Ht 5\' 10"  (1.778 m)  Wt 164 lb 3.9 oz (74.5 kg)  BMI 23.57 kg/m2  SpO2 96%  Discharge Labs:  Results for orders placed during the hospital  encounter of 01/30/13 (from the past 24 hour(s))  GLUCOSE, CAPILLARY     Status: Abnormal   Collection Time    02/15/13 11:25 AM      Result Value Range   Glucose-Capillary 108 (*) 70 - 99 mg/dL  GLUCOSE, CAPILLARY     Status: Abnormal   Collection Time    02/15/13  4:15 PM      Result Value Range   Glucose-Capillary 166 (*) 70 - 99 mg/dL  GLUCOSE, CAPILLARY     Status: Abnormal   Collection Time    02/15/13  8:35 PM      Result Value Range   Glucose-Capillary 149 (*) 70 - 99 mg/dL  PROTIME-INR     Status: Abnormal   Collection Time    02/16/13  5:45 AM      Result Value Range   Prothrombin Time 18.4 (*) 11.6 - 15.2 seconds   INR 1.58 (*) 0.00 - 1.49  CBC WITH DIFFERENTIAL     Status: Abnormal   Collection Time    02/16/13  5:45 AM      Result Value Range   WBC 13.5 (*) 4.0 - 10.5 K/uL   RBC 4.04 (*) 4.22 - 5.81 MIL/uL   Hemoglobin 12.8 (*) 13.0 - 17.0 g/dL   HCT  39.9  39.0 - 52.0 %   MCV 98.8  78.0 - 100.0 fL   MCH 31.7  26.0 - 34.0 pg   MCHC 32.1  30.0 - 36.0 g/dL   RDW 46.9 (*) 62.9 - 52.8 %   Platelets 235  150 - 400 K/uL   Neutrophils Relative % 82 (*) 43 - 77 %   Lymphocytes Relative 10 (*) 12 - 46 %   Monocytes Relative 5  3 - 12 %   Eosinophils Relative 2  0 - 5 %   Basophils Relative 1  0 - 1 %   Neutro Abs 11.0 (*) 1.7 - 7.7 K/uL   Lymphs Abs 1.4  0.7 - 4.0 K/uL   Monocytes Absolute 0.7  0.1 - 1.0 K/uL   Eosinophils Absolute 0.3  0.0 - 0.7 K/uL   Basophils Absolute 0.1  0.0 - 0.1 K/uL   WBC Morphology ATYPICAL LYMPHOCYTES     Smear Review PLATELET CLUMPS NOTED ON SMEAR    BASIC METABOLIC PANEL     Status: Abnormal   Collection Time    02/16/13  5:45 AM      Result Value Range   Sodium 137  135 - 145 mEq/L   Potassium 4.3  3.5 - 5.1 mEq/L   Chloride 97  96 - 112 mEq/L   CO2 27  19 - 32 mEq/L   Glucose, Bld 141 (*) 70 - 99 mg/dL   BUN 31 (*) 6 - 23 mg/dL   Creatinine, Ser 4.13 (*) 0.50 - 1.35 mg/dL   Calcium 9.3  8.4 - 24.4 mg/dL   GFR calc non Af  Amer 7 (*) >90 mL/min   GFR calc Af Amer 9 (*) >90 mL/min  GLUCOSE, CAPILLARY     Status: Abnormal   Collection Time    02/16/13  7:33 AM      Result Value Range   Glucose-Capillary 139 (*) 70 - 99 mg/dL   Comment 1 Documented in Chart     Comment 2 Notify RN      Signed: Pleas Koch, MD 02/16/2013, 10:45 AM   Time Spent on Discharge: 35 minutes Services Ordered on Discharge: None Equipment Ordered on Discharge: None

## 2013-02-16 NOTE — Progress Notes (Signed)
Patient ID: Chris Vance, male   DOB: [birth year], 72 y.o.   MRN: [id number] Postoperative day 3 right transtibial amputation. Patient states he is comfortable occasionally has some muscle spasm. Feel the patient is safe for discharge to skilled nursing at this time I will followup in the office in 2 weeks.

## 2013-02-16 NOTE — Progress Notes (Signed)
Subjective:  [name]. Patient is doing well. A+Ox3. Feels much better than yesterday. Patient will have HD then be d/c to SNF.  Objective: Vital signs in last 24 hours: Filed Vitals:   02/15/13 0602 02/15/13 1300 02/15/13 2129 02/16/13 0545  BP: 125/66 103/59 124/62 112/66  Pulse: 78 88 109 80  Temp: 97.8 F (36.6 C) 97.9 F (36.6 C) 98 F (36.7 C) 97.4 F (36.3 C)  TempSrc: Oral Oral Oral Oral  Resp: 18 18 18 18   Height:      Weight:      SpO2: 95% 93% 95% 96%   Weight change:  No intake or output data in the 24 hours ending 02/16/13 0840 Vitals reviewed. General: Sitting up in bed, in NAD, A+O x 3  HEENT: no scleral icterus Cardiac: RRR, no rubs, or gallops, 2/3 SEM heard best at the RUSB Pulm: bibasilar lung crackles, no wheezes, rales, or rhonchi Abd: soft, nontender, nondistended, BS present Ext: Left leg warm and wall perfused, Right BKA with stump covered in dressing that is c/d/i Neuro: alert and oriented X3, conversant, following commands, no facial droop  Lab Results: Basic Metabolic Panel:  Recent Labs Lab 02/15/13 0500 02/16/13 0545  NA 138 137  K 4.4 4.3  CL 96 97  CO2 25 27  GLUCOSE 134* 141*  BUN 23 31*  CREATININE 5.26* 6.69*  CALCIUM 9.3 9.3   LCBC:  Recent Labs Lab 02/15/13 0500 02/16/13 0545  WBC 12.5* 13.5*  NEUTROABS 9.9* 11.0*  HGB 13.0 12.8*  HCT 41.0 39.9  MCV 98.8 98.8  PLT 229 235   CBG:  Recent Labs Lab 02/14/13 2047 02/15/13 0743 02/15/13 1125 02/15/13 1615 02/15/13 2035 02/16/13 0733  GLUCAP 129* 146* 108* 166* 149* 139*   Coagulation:  Recent Labs Lab 02/13/13 0520 02/14/13 0508 02/15/13 0500 02/16/13 0545  LABPROT 15.9* 15.2 15.9* 18.4*  INR 1.30 1.23 1.30 1.58*   Medications: I have reviewed the patient's current medications. Scheduled Meds: . amiodarone  200 mg Oral BID  . calcium acetate  2,001 mg Oral TID WC  . collagenase   Topical Daily  . feeding supplement (RESOURCE BREEZE)  1 Container  Oral TID WC  . insulin aspart  0-9 Units Subcutaneous TID WC  . magic mouthwash  5 mL Oral TID  . metoprolol  100 mg Oral BID  . multivitamin  1 tablet Oral QHS  . pantoprazole  40 mg Oral Daily  . piperacillin-tazobactam (ZOSYN)  IV  2.25 g Intravenous Q8H  . sodium chloride  3 mL Intravenous Q12H  . vancomycin  1,000 mg Intravenous Once in dialysis   Continuous Infusions:  PRN Meds:.sodium chloride, sodium chloride, sodium chloride, acetaminophen, acetaminophen, camphor-menthol, feeding supplement (NEPRO CARB STEADY), heparin, heparin, lidocaine (PF), lidocaine-prilocaine, metoCLOPramide (REGLAN) injection, metoCLOPramide, ondansetron (ZOFRAN) IV, ondansetron, oxyCODONE-acetaminophen, pentafluoroprop-tetrafluoroeth, sodium chloride Assessment/Plan: Mr. Wyndham is a 72 year old man with PMH of A.fib (on coumadin, BB), ESRD on HD, DM2, presenting with encephalopathy and sepsis due to bilateral foot ulcers.   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 Presented with  bilateral foot wounds with cellulitis and possible deep infection including OM. Given tachycardia, AMS and leukocytosis at presentation, this represented severe sepsis that has now resolved. MRI bl feet demonstrated possible OM of left 5th digit, myositis of right foot and left foot. No evidence of septic joint. Diffuse cellulitis was present and improved somewhat with IV antibiotics (Vanc  and Zosyn) and he had remained afebrile. Orthopedic Surgery and Vascular Surgery were consulted.   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 Dr. Lajoyce Corners, in Orthopedic Surgery, recommended right BKA given the patient's severe arterial disease. Dr. Lajoyce Corners and the primary team  had extensive discussion with the patient and his family (his brother) about the risks and benefits of this surgical intervention and the patient agreed to proceed with BKA which he underwent on 12/19 with no complications. Orthopedic Surgery following, recommended doxy 100 mg BID for four weeks for possible osteo of left 5th digit.  ESRD c/b Hyperkalemia - Intermittent hyperkalemia  with missed HD.  - HD per Nephrology, scheduled for tomorrow.   Acute Encephalopathy -Resolved. Likely multifactorial, due to UTI, sepsis, bil foot infection, uremia, drug side effect, and baseline dementia. Mental status has much improved ans is essentially at baseline per patient's borther. The patient continues to have intermittent episodes of confusion and agitation worse at night, likely sundowning.   A. Fib - He had A. Fib with with RVR with rate >110 that resolved on 12/13. He had not taken his BB two days before admission that promptly resolved with his home metoprolol 50mg  but returned the next day. He was on unusual rate control/BB regimen at home with metoprolol 50 mg BID and labetalol 100 mg BID. We discontinued his home labetalol but continued metoprolol. On 02/02/13 the patient refused his rate control meds. That night he became tachycardic to 140's. In the morning he was agreeable to taking his HR meds and HR was less than 110. On 12/11 his HR continued to be >110, therefore, we consulted Cardiology (Dr. Mayford Knife is his primary Cardiologist) who increased metoprolol to 100 mg BID and recommended initiation of amiodarone if needed. He remained in A. Fib with RVR and amiodarone PO was started on 12/12. Of note, he was initially on warfarin but this was held for his arteriography and he was placed on heparin drip while in A. Fib with RVR. Heparin drip was discontinued on 12/18. He remains in A. Fib HR mostly under 110 since that time.  -Continue metoprolol 100mg  BID -Continue amiodarone 200mg  BID  Rib fractures-  Multiple rib fractures. Patient is breathing comfortably. Will hold narcotics given AMS and administer tylenol prn. No signs of paradoxical chest wall movement at this time. Thus, no concern for flail chest. Of note, binders are contraindicated as they may increase risk of respiratory complications. His pain is not well controlled with Tylenol alone and he is at risk of acute delirium 2/2 pain.  -Incentive spirometry   Coumadin Treatment -He is on coumadin Therapy for Afib as outpatient. Initially, patients INR was >10, but recheck showed INR 1.32. Thus the initial was likely a lab error. Patient was started on coumadin per pharmacy. He became therapeutic. Once, Vasc surgery recommended arteriography, coumadin was held. Last dose was on 02/03/13. May consider reversal with Vit K as INR has continued to trend up (3.92) on 02/06/13. Vit K given on 12/12. INR 1.77 on 12/13. Coumadin restarted by pharmacy.  DM type 2- Home lantus dose is 58 U qhs. Decreased to 40 U while inpatient. SSI sensitive. CBG q AC and HS. Hg A1C of 6.5%. As patient has had inconsistent PO intake of food, lantus has been held.  Social issues - Patient was brought from home that is filled with trash, excrement, dirty diapers and rodents. Son feels that house needs to be condemned. Patient will likely need SNF placement for rehab.  - Consult SW   DVT PPx: SCDs to right leg, no heparin or Lovenox per Ortho at this time to prevent surgical wound bleeding.     Dispo: Disposition is deferred at this time, awaiting improvement of current medical problems.    The patient does have a current PCP Aida Puffer, MD) and does not need an [facility] hospital follow-up appointment after discharge.  The patient does have transportation limitations that hinder transportation to clinic appointments.  .Services Needed at time of discharge: Y = Yes, Blank = No PT:   OT:   RN:   Equipment:   Other:     LOS: 17 days   Pleas Koch,  MD 02/16/2013, 8:40 AM

## 2013-02-16 NOTE — Progress Notes (Signed)
ANTICOAGULATION CONSULT NOTE - Follow Up Consult  Pharmacy Consult for warfarin Indication: atrial fibrillation  Allergies  Allergen Reactions  . Ace Inhibitors   . Lisinopril Cough    Patient Measurements: Height: 5\' 10"  (177.8 cm) Weight: 164 lb 3.9 oz (74.5 kg) IBW/kg (Calculated) : 73   Vital Signs: Temp: 97.4 F (36.3 C) (12/22 0545) Temp src: Oral (12/22 0545) BP: 112/66 mmHg (12/22 0545) Pulse Rate: 80 (12/22 0545)  Labs:  Recent Labs  02/14/13 0508 02/14/13 1650 02/15/13 0500 02/16/13 0545  HGB 13.0  --  13.0 12.8*  HCT 41.2  --  41.0 39.9  PLT 267  --  229 235  LABPROT 15.2  --  15.9* 18.4*  INR 1.23  --  1.30 1.58*  CREATININE 4.02* 4.68* 5.26* 6.69*    Estimated Creatinine Clearance: 10.5 ml/min (by C-G formula based on Cr of 6.69).  Assessment: 72yo ESRD male with AFib, Coumadin has been on hold, heparin bridge started but stopped orders to resume warfarin tonight. CBC stable with no bleeding currently noted. INR is 1.58 and has been trending up despite no warfarin being given. Will give low dose warfarin tonight and continue daily INR's.  Antibiotics stopped this morning.  Goal of Therapy:  INR 2-3 Monitor platelets by anticoagulation protocol: Yes   Plan:  1. Warfarin 2.5mg  tonight 2. Daily INR  Sheppard Coil PharmD., BCPS Clinical Pharmacist Pager [phone] 02/16/2013 10:08 AM

## 2013-02-17 ENCOUNTER — Other Ambulatory Visit: Payer: Self-pay | Admitting: *Deleted

## 2013-02-17 ENCOUNTER — Encounter (HOSPITAL_COMMUNITY): Payer: Self-pay | Admitting: Orthopedic Surgery

## 2013-02-17 LAB — CBC
Hemoglobin: 13 g/dL (ref 13.0–17.0)
MCH: 32.4 pg (ref 26.0–34.0)
MCHC: 33.5 g/dL (ref 30.0–36.0)
Platelets: 246 10*3/uL (ref 150–400)
RBC: 4.01 MIL/uL — ABNORMAL LOW (ref 4.22–5.81)
WBC: 12.8 10*3/uL — ABNORMAL HIGH (ref 4.0–10.5)

## 2013-02-17 LAB — RENAL FUNCTION PANEL
Albumin: 2.3 g/dL — ABNORMAL LOW (ref 3.5–5.2)
BUN: 17 mg/dL (ref 6–23)
Chloride: 95 mEq/L — ABNORMAL LOW (ref 96–112)
Creatinine, Ser: 4.34 mg/dL — ABNORMAL HIGH (ref 0.50–1.35)
GFR calc Af Amer: 14 mL/min — ABNORMAL LOW (ref >90)
Glucose, Bld: 164 mg/dL — ABNORMAL HIGH (ref 70–99)
Potassium: 3.7 mEq/L (ref 3.5–5.1)
Sodium: 135 mEq/L (ref 135–145)

## 2013-02-17 LAB — GLUCOSE, CAPILLARY: Glucose-Capillary: 120 mg/dL — ABNORMAL HIGH (ref 70–99)

## 2013-02-17 LAB — PROTIME-INR
INR: 1.32 (ref 0.00–1.49)
Prothrombin Time: 16.1 seconds — ABNORMAL HIGH (ref 11.6–15.2)

## 2013-02-17 MED ORDER — LIDOCAINE HCL (PF) 1 % IJ SOLN: 5.0000 mL | INTRAMUSCULAR | Status: DC | PRN

## 2013-02-17 MED ORDER — PENTAFLUOROPROP-TETRAFLUOROETH EX AERO: 1.0000 "application " | INHALATION_SPRAY | CUTANEOUS | Status: DC | PRN

## 2013-02-17 MED ORDER — METOPROLOL TARTRATE 12.5 MG HALF TABLET
12.5000 mg | ORAL_TABLET | Freq: Two times a day (BID) | ORAL | Status: DC
  Filled 2013-02-17 (×2): qty 1

## 2013-02-17 MED ORDER — NEPRO/CARBSTEADY PO LIQD: 237.0000 mL | ORAL | Status: DC | PRN

## 2013-02-17 MED ORDER — LIDOCAINE-PRILOCAINE 2.5-2.5 % EX CREA
1.0000 "application " | TOPICAL_CREAM | CUTANEOUS | Status: DC | PRN
  Administered 2013-02-17: 1 via TOPICAL
  Filled 2013-02-17: qty 5

## 2013-02-17 MED ORDER — HYDROCODONE-ACETAMINOPHEN 5-325 MG PO TABS: ORAL_TABLET | ORAL | Status: DC

## 2013-02-17 MED ORDER — OXYCODONE-ACETAMINOPHEN 5-325 MG PO TABS
ORAL_TABLET | ORAL | Status: AC
  Administered 2013-02-17: 1 via ORAL
  Filled 2013-02-17: qty 1

## 2013-02-17 MED ORDER — HEPARIN SODIUM (PORCINE) 1000 UNIT/ML DIALYSIS: 1000.0000 [IU] | INTRAMUSCULAR | Status: DC | PRN

## 2013-02-17 MED ORDER — SODIUM CHLORIDE 0.9 % IV SOLN: 100.0000 mL | INTRAVENOUS | Status: DC | PRN

## 2013-02-17 MED ORDER — ALTEPLASE 2 MG IJ SOLR
2.0000 mg | Freq: Once | INTRAMUSCULAR | Status: DC | PRN
  Filled 2013-02-17: qty 2

## 2013-02-17 MED ORDER — HYDROCODONE-ACETAMINOPHEN 5-325 MG PO TABS: 1.0000 | ORAL_TABLET | Freq: Four times a day (QID) | ORAL | Status: DC | PRN

## 2013-02-17 MED ORDER — WARFARIN SODIUM 5 MG PO TABS
5.0000 mg | ORAL_TABLET | Freq: Once | ORAL | Status: DC
  Filled 2013-02-17: qty 1

## 2013-02-17 NOTE — Progress Notes (Signed)
    Subjective:  Mildly confused. No SOB, no CP. Wants to get out of here he says.   Objective:  Vital Signs in the last 24 hours: Temp:  [97.4 F (36.3 C)-98 F (36.7 C)] 97.4 F (36.3 C) (12/23 1054) Pulse Rate:  [79-114] 109 (12/23 1054) Resp:  [10-22] 22 (12/23 1054) BP: (91-136)/(32-77) 109/74 mmHg (12/23 1054) SpO2:  [92 %-98 %] 94 % (12/23 1054) Weight:  [156 lb 12 oz (71.1 kg)-173 lb 3.3 oz (78.566 kg)] 156 lb 12 oz (71.1 kg) (12/23 1054)  Intake/Output from previous day: 12/22 0701 - 12/23 0700 In: -  Out: 3000    Physical Exam: General: Well developed, well nourished, in no acute distress. Head:  Normocephalic and atraumatic. Lungs: Clear to auscultation and percussion. Heart: Irreg irreg.  No murmur, rubs or gallops.  Abdomen: soft, non-tender, positive bowel sounds. Extremities: Amputation. Neurologic: Awake, mildly confused.     Lab Results:  Recent Labs  02/16/13 0545 02/17/13 0716  WBC 13.5* 12.8*  HGB 12.8* 13.0  PLT 235 246    Recent Labs  02/16/13 0545 02/17/13 0712  NA 137 135  K 4.3 3.7  CL 97 95*  CO2 27 27  GLUCOSE 141* 164*  BUN 31* 17  CREATININE 6.69* 4.34*   No results found for this basename: TROPONINI, CK, MB,  in the last 72 hours Hepatic Function Panel  Recent Labs  02/17/13 0712  ALBUMIN 2.3*     Telemetry: AFIB 90-120 Personally viewed.  ECHO: normal EF  Assessment/Plan:  Principal Problem:   Sepsis Active Problems:   Hyperlipidemia   End stage renal disease   Open wound of left foot   SVT (supraventricular tachycardia)   Multiple rib fractures   Peripheral vascular disease   Anemia of chronic renal failure   Hx of right BKA   Subacute confusional state   Secondary hyperparathyroidism  1) AFIB  - amio 200 BID, continue.   - metoprolol decreased dose due to low BP/hypotension.   2) Chronic anticoagulation  - Warfarin  No further cardiac comments.   SKAINS, MARK 02/17/2013, 12:47 PM

## 2013-02-17 NOTE — Progress Notes (Signed)
ANTICOAGULATION CONSULT NOTE - Follow Up Consult  Pharmacy Consult for warfarin Indication: atrial fibrillation  Allergies  Allergen Reactions  . Ace Inhibitors   . Lisinopril Cough    Patient Measurements: Height: 5\' 10"  (177.8 cm) Weight: 156 lb 12 oz (71.1 kg) IBW/kg (Calculated) : 73   Vital Signs: Temp: 97.4 F (36.3 C) (12/23 1054) Temp src: Oral (12/23 1054) BP: 109/74 mmHg (12/23 1054) Pulse Rate: 109 (12/23 1054)  Labs:  Recent Labs  02/15/13 0500 02/16/13 0545 02/17/13 0712 02/17/13 0716  HGB 13.0 12.8*  --  13.0  HCT 41.0 39.9  --  38.8*  PLT 229 235  --  246  LABPROT 15.9* 18.4*  --   --   INR 1.30 1.58*  --   --   CREATININE 5.26* 6.69* 4.34*  --     Estimated Creatinine Clearance: 15.7 ml/min (by C-G formula based on Cr of 4.34).  Assessment: 72yo ESRD male with AFib, Coumadin has been on hold, heparin bridge started but stopped orders to resume warfarin tonight. CBC stable with no bleeding currently noted. INR was 1.58 yesterday and was not redrawn this morning. Concern that INR has been trending up despite no warfarin being given, unfortunate that lab was not drawn as ordered so that trend could be made. Will return to home dose 5mg  at discharge.  Antibiotics stopped  Goal of Therapy:  INR 2-3 Monitor platelets by anticoagulation protocol: Yes   Plan:  1. Warfarin 5mg  daily  2. Recommend close follow up with INR check as soon as possible given holidays  Sheppard Coil PharmD., BCPS Clinical Pharmacist Pager [phone] 02/17/2013 11:22 AM

## 2013-02-17 NOTE — Progress Notes (Addendum)
Physical Therapy Treatment Patient Details Name: Chris Vance MRN: [id number] DOB: [birth year] Today's Date: 02/17/2013 Time: 4098-1191 PT Time Calculation (min): 18 min  PT Assessment / Plan / Recommendation  History of Present Illness Pt was admitted 12/4- after missing dialysis and police finding him down at home confused; bleeding from fall at home.  Pt was dx with bil foot wounds/cellulitis, AMS (worse than PTA), ESRD, and anemia.  Pt underwent R transtibial amputation on 12/19.   PT Comments   Re-eval preformed, goals updated based on new status s/p BKA R.  Patient is perseverating on talking to his son to d/c to SNF, and his limited attention limits his effort in therapy today.  This discussed with CSW and attempted multiple times to call his son, Chris Vance, who didn't pick up.  He is able to stand x1 on L leg, though it is for a short duration.  Pt will benefit from skilled PT intervention to address his deficits of strength, functional mobility, and safety with potential ambulation in his future.   Follow Up Recommendations  SNF           Equipment Recommendations  Wheelchair (measurements PT);Rolling walker with 5" wheels       Frequency Min 2X/week   Progress towards PT Goals Progress towards PT goals: Progressing toward goals  Plan Current plan remains appropriate;Frequency needs to be updated    Precautions / Restrictions Precautions Precautions: Fall Precaution Comments: R transtibial amputation Restrictions Weight Bearing Restrictions: No   Pertinent Vitals/Pain Pain in L knee and R limb reported, no number given, changed positions to address.    Mobility  Bed Mobility Bed Mobility: Not assessed Transfers Transfers: Sit to Stand;Stand to Sit Sit to Stand: With upper extremity assist;From elevated surface;1: +2 Total assist Sit to Stand: Patient Percentage: 50% Stand to Sit: Without upper extremity assist;To elevated surface;1: +2 Total assist Stand to Sit:  Patient Percentage: 50% Details for Transfer Assistance: Patient requires VC for hand placement and encouragement that this is safe with our A here.  Patient is unable to achieve standing with the bed lowered.  Patient is able to achieve standing with the bed raised and help x2 at hip hips to A with standing.  Patient is unable to fully extend his L knee to stand fully erect due to knee pain.  Patient is able to stand for 10 seconds before leaning back onto bed. Ambulation/Gait Ambulation/Gait Assistance: Not tested (comment)    Exercises General Exercises - Lower Extremity Short Arc Quad: AROM;10 reps Hip ABduction/ADduction: AROM;10 reps     PT Goals (current goals can now be found in the care plan section) Acute Rehab PT Goals Patient Stated Goal: to get better PT Goal Formulation: With patient Time For Goal Achievement: 02/16/13 Potential to Achieve Goals: Fair  Visit Information  Last PT Received On: 02/17/13 Assistance Needed: +2 History of Present Illness: Pt was admitted 12/4- after missing dialysis and police finding him down at home confused; bleeding from fall at home.  Pt was dx with bil foot wounds/cellulitis, AMS (worse than PTA), ESRD, and anemia.  Pt underwent R transtibial amputation on 12/19.    Subjective Data  Subjective: Perseverating on getting d/c to SNF Patient Stated Goal: to get better   Cognition  Cognition Arousal/Alertness: Awake/alert Behavior During Therapy: Flat affect;Impulsive Overall Cognitive Status: No family/caregiver present to determine baseline cognitive functioning Area of Impairment: Attention;Following commands;Safety/judgement;Memory Current Attention Level: Focused (Perseverating on getting d/c and calling son) Memory: Decreased recall  of precautions;Decreased short-term memory Following Commands: Follows one step commands inconsistently Safety/Judgement: Decreased awareness of safety Awareness: Intellectual Problem Solving: Slow  processing;Difficulty sequencing;Decreased initiation;Requires verbal cues;Requires tactile cues General Comments: Patient is confused and agitated.  Increased time to initiate mobility    Balance  Balance Balance Assessed: Yes Static Standing Balance Static Standing - Balance Support: Bilateral upper extremity supported Static Standing - Level of Assistance: 1: +2 Total assist (Patient performed 50%) Static Standing - Comment/# of Minutes: 20 seconds  End of Session PT - End of Session Equipment Utilized During Treatment: Gait belt Activity Tolerance: Patient limited by fatigue Patient left: in bed;with call bell/phone within reach Nurse Communication: Mobility status   GP    Barrie Dunker, SPT Pager:  [phone]  Barrie Dunker 02/17/2013, 3:28 PM Read, reviewed, and agree.  Rollene Rotunda Chris Vance, PT, DPT [phone]

## 2013-02-17 NOTE — Discharge Summary (Signed)
I saw Chris Vance on day of discharge and agree with plan.

## 2013-02-17 NOTE — Progress Notes (Addendum)
Subjective:  [name]. Patient doing well in HD.Excited for discharge today.  Objective: Vital signs in last 24 hours: Filed Vitals:   02/17/13 0513 02/17/13 0702 02/17/13 0712 02/17/13 0721  BP: 128/72 128/67 125/65 110/74  Pulse: 107 104 95 94  Temp: 98 F (36.7 C) 97.5 F (36.4 C)    TempSrc: Oral Oral    Resp: 18 14 20 14   Height:      Weight: 173 lb 3.3 oz (78.566 kg) 161 lb 9.6 oz (73.3 kg)    SpO2: 97% 98%     Weight change:   Intake/Output Summary (Last 24 hours) at 02/17/13 0733 Last data filed at 02/16/13 1515  Gross per 24 hour  Intake      0 ml  Output   3000 ml  Net  -3000 ml   Vitals reviewed. General: Sitting up in bed, in NAD, A+O x 3  HEENT: no scleral icterus Cardiac: RRR, no rubs, or gallops, 2/3 SEM heard best at the RUSB Pulm: bibasilar lung crackles, no wheezes, rales, or rhonchi Abd: soft, nontender, nondistended, BS present Ext: Left leg warm and wall perfused, Right BKA with stump covered in dressing that is c/d/i Neuro: alert and oriented X3, conversant, following commands, no facial droop  Lab Results: Basic Metabolic Panel:  Recent Labs Lab 02/15/13 0500 02/16/13 0545  NA 138 137  K 4.4 4.3  CL 96 97  CO2 25 27  GLUCOSE 134* 141*  BUN 23 31*  CREATININE 5.26* 6.69*  CALCIUM 9.3 9.3   LCBC:  Recent Labs Lab 02/15/13 0500 02/16/13 0545  WBC 12.5* 13.5*  NEUTROABS 9.9* 11.0*  HGB 13.0 12.8*  HCT 41.0 39.9  MCV 98.8 98.8  PLT 229 235   CBG:  Recent Labs Lab 02/15/13 1125 02/15/13 1615 02/15/13 2035 02/16/13 0733 02/16/13 1623 02/16/13 2206  GLUCAP 108* 166* 149* 139* 112* 171*   Coagulation:  Recent Labs Lab 02/13/13 0520 02/14/13 0508 02/15/13 0500 02/16/13 0545  LABPROT 15.9* 15.2 15.9* 18.4*  INR 1.30 1.23 1.30 1.58*   Medications: I have reviewed the patient's current medications. Scheduled Meds: . amiodarone  200 mg Oral BID  . calcium acetate  2,001 mg Oral TID WC  . collagenase   Topical  Daily  . doxycycline  100 mg Oral Q12H  . feeding supplement (RESOURCE BREEZE)  1 Container Oral TID WC  . insulin aspart  0-9 Units Subcutaneous TID WC  . magic mouthwash  5 mL Oral TID  . metoprolol  25 mg Oral BID  . multivitamin  1 tablet Oral QHS  . pantoprazole  40 mg Oral Daily  . sodium chloride  3 mL Intravenous Q12H  . Warfarin - Pharmacist Dosing Inpatient   Does not apply q1800   Continuous Infusions:  PRN Meds:.sodium chloride, sodium chloride, sodium chloride, acetaminophen, acetaminophen, alteplase, camphor-menthol, feeding supplement (NEPRO CARB STEADY), heparin, heparin, lidocaine (PF), lidocaine-prilocaine, metoCLOPramide (REGLAN) injection, metoCLOPramide, ondansetron (ZOFRAN) IV, ondansetron, oxyCODONE-acetaminophen, pentafluoroprop-tetrafluoroeth, sodium chloride Assessment/Plan: Chris Vance is a 72 year old man with PMH of A.fib (on coumadin, BB), ESRD on HD, DM2, presented with encephalopathy and sepsis due to bilateral foot ulcers.  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  Patient continues to have a possible cellulitis of the left 5th metatarsal. The patient is doing well postoperatively.  ESRD c/b Hyperkalemia   - HD per Nephrology  Acute Encephalopathy -Resolved. Likely multifactorial, due to UTI, sepsis,  bil foot infection, uremia, drug side effect, and baseline dementia. Mental status has much improved ans is essentially at baseline per patient's borther. The patient continues to have intermittent episodes of confusion and agitation worse at night, likely sundowning.   A. Fib -  Well controlled on  -Metoprolol 100mg  BID -Amiodarone 200mg  BID  Rib fractures- Multiple rib fractures at admission. Patient has no chest pain at this time.  Coumadin Treatment -. Coumadin restarted by pharmacy yesterday.  DM type 2- Continue SSI while inpatient. Will discharge on home regimen.  DVT PPx: SCDs to right leg,  no heparin or Lovenox per Ortho at this time to prevent surgical wound bleeding.     Dispo: Disposition is deferred at this time, awaiting improvement of current medical problems.    The patient does have a current PCP Aida Puffer, MD) and does not need an [facility] hospital follow-up appointment after discharge.  The patient does have transportation limitations that hinder transportation to clinic appointments.  .Services Needed at time of discharge: Y = Yes, Blank = No PT:   OT:   RN:   Equipment:   Other:     LOS: 18 days   Pleas Koch, MD 02/17/2013, 7:33 AM

## 2013-02-17 NOTE — Procedures (Signed)
I was present at this session.  I have reviewed the session itself and made appropriate changes.  HD via RLA AVF.  bp 120s.  Sweta Halseth L 12/23/20149:20 AM

## 2013-02-17 NOTE — Progress Notes (Signed)
CSW (Clinical Child psychotherapist) continues to assist with pt discharge to SNF. CSW has been informed that [name] able [facility] accept pt as they are not able to take pt to his dialysis facility. CSW left voicemail for pt son to update and give bed offers for facilities that CSW confirmed are able to take pt to dialysis facility.   Shirlena Brinegar, LCSWA [phone]

## 2013-02-17 NOTE — Progress Notes (Signed)
CSW (Clinical Child psychotherapist) spoke with pt son [name] and confirmed [facility] was for [employer]. Pt son confirmed and was agreeable to pt discharging there today. CSW (Clinical Child psychotherapist) prepared pt dc packet and placed with shadow chart. CSW arranged non-emergent ambulance transport. Pt, pt family, pt nurse, and facility informed. CSW signing off.  Alessio Bogan, LCSWA [phone]

## 2013-02-17 NOTE — Progress Notes (Addendum)
CSW (Clinical Child psychotherapist) spoke with pt brother Molly Maduro [phone] as CSW still has not heard from pt son. Pt brother was given bed offers and called CSW back stating he spoke to pt son and they believe [facility] a good fit. Pt brother did ask that CSW continue to try and touch base with pt son however because he is "the decision maker". CSW left third message for pt brother informing that pt is ready to be discharged today and from CSW conversation with pt brother plan will be for [place]. CSW did also contact facility and notified of bed acceptance.  Koni Kannan, LCSWA [phone]

## 2013-02-17 NOTE — Progress Notes (Signed)
Subjective: Interval History: has no complaint of pain.  Objective: Vital signs in last 24 hours: Temp:  [97.5 F (36.4 C)-98 F (36.7 C)] 97.5 F (36.4 C) (12/23 0702) Pulse Rate:  [79-111] 98 (12/23 0900) Resp:  [10-22] 14 (12/23 0900) BP: (95-136)/(32-85) 118/62 mmHg (12/23 0900) SpO2:  [92 %-98 %] 98 % (12/23 0702) Weight:  [73.3 kg (161 lb 9.6 oz)-78.9 kg (173 lb 15.1 oz)] 73.3 kg (161 lb 9.6 oz) (12/23 0702) Weight change:   Intake/Output from previous day: 12/22 0701 - 12/23 0700 In: -  Out: 3000  Intake/Output this shift:    General appearance: cooperative, slowed mentation and Ox2 Resp: diminished breath sounds bilaterally Cardio: S1, S2 normal GI: pos bs, liver down 5 cm,soft Extremities: bilat fem bruits, RBKA, avf RLA B&T  Lab Results:  Recent Labs  02/16/13 0545 02/17/13 0716  WBC 13.5* 12.8*  HGB 12.8* 13.0  HCT 39.9 38.8*  PLT 235 246   BMET:  Recent Labs  02/16/13 0545 02/17/13 0712  NA 137 135  K 4.3 3.7  CL 97 95*  CO2 27 27  GLUCOSE 141* 164*  BUN 31* 17  CREATININE 6.69* 4.34*  CALCIUM 9.3 8.8   No results found for this basename: PTH,  in the last 72 hours Iron Studies: No results found for this basename: IRON, TIBC, TRANSFERRIN, FERRITIN,  in the last 72 hours  Studies/Results: No results found.  I have reviewed the patient's current medications.  Assessment/Plan: 1 ESRD on HD, set upoutpatient 2 Low bp lowe meds 3 Afib lower lopressor, cont amio 4 anemia off meds 5 confusion some better 6 social to go to NH P HD , lower lopressor. D/C    LOS: 18 days   Mir Fullilove L 02/17/2013,9:20 AM

## 2013-02-23 ENCOUNTER — Ambulatory Visit: Payer: Medicare Other

## 2013-03-02 ENCOUNTER — Non-Acute Institutional Stay (SKILLED_NURSING_FACILITY): Payer: Medicare Other | Admitting: Nurse Practitioner

## 2013-03-02 DIAGNOSIS — I739 Peripheral vascular disease, unspecified: Secondary | ICD-10-CM

## 2013-03-02 DIAGNOSIS — S88119A Complete traumatic amputation at level between knee and ankle, unspecified lower leg, initial encounter: Secondary | ICD-10-CM

## 2013-03-02 DIAGNOSIS — D631 Anemia in chronic kidney disease: Secondary | ICD-10-CM

## 2013-03-02 DIAGNOSIS — E1159 Type 2 diabetes mellitus with other circulatory complications: Secondary | ICD-10-CM

## 2013-03-02 DIAGNOSIS — I4891 Unspecified atrial fibrillation: Secondary | ICD-10-CM

## 2013-03-02 DIAGNOSIS — Z89511 Acquired absence of right leg below knee: Secondary | ICD-10-CM

## 2013-03-02 DIAGNOSIS — N189 Chronic kidney disease, unspecified: Secondary | ICD-10-CM

## 2013-03-02 DIAGNOSIS — N186 End stage renal disease: Secondary | ICD-10-CM

## 2013-03-02 DIAGNOSIS — N039 Chronic nephritic syndrome with unspecified morphologic changes: Secondary | ICD-10-CM

## 2013-03-02 NOTE — Progress Notes (Signed)
Patient ID: Chris Vance, male   DOB: [birth year], 73 y.o.   MRN: [id number]    Nursing Home Location:  [place] of Service: SNF (51)  PCP: Tamsen Roers, MD  Allergies  Allergen Reactions  . Ace Inhibitors   . Lisinopril Cough    Chief Complaint  Patient presents with  . Hospitalization Follow-up  . Medical Managment of Chronic Issues    HPI:  Chris Vance is a 73 y.o. 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  Pt currently doing well at [facility]; has had some hypoglycemic episodes so nursing request his insulin be re-evaluated; pt reports good appetite, pain well controlled with medications, conts to do well with therapy and going to HD 3 days a week (MWF)  Pt is now a LTR at [facility] most likely due to unsafe living conditions at home (family reported house was needing to be condemned   Review of Systems:  Review of Systems  Constitutional: Negative for fever, chills and malaise/fatigue.  Respiratory: Negative for cough and shortness of breath.   Cardiovascular: Negative for chest pain and leg swelling.  Gastrointestinal: Negative for heartburn, abdominal pain, diarrhea and constipation.  Genitourinary: Negative for dysuria.  Musculoskeletal: Negative for joint pain and myalgias.       Right BKA  Skin: Negative.   Neurological: Negative for dizziness, weakness and headaches.  Psychiatric/Behavioral: Negative for depression.     Past Medical History  Diagnosis Date  . Renal disorder   . Diabetes  mellitus   . Hypertension   . Anemia   . Hyperlipidemia   . Thyroid disease   . Anxiety   . Skin cancer    Past Surgical History  Procedure Laterality Date  . Av fistula placement    . Amputation Right 02/13/2013    Procedure: Right Below Knee Amputation;  Surgeon: Newt Minion, MD;  Location: [facility];  Service: Orthopedics;  Laterality: Right;  Right Below Knee Amputation   Social History:   reports that he has been smoking Cigarettes.  He started smoking about 56 years ago. He has a 55 pack-year smoking history. He has never used smokeless tobacco. He reports that he does not drink alcohol or use illicit drugs.  Family History  Problem Relation Age of Onset  . Asthma Father     Medications: Patient's Medications  New Prescriptions   No medications on file  Previous Medications   ACETAMINOPHEN (TYLENOL) 325 MG TABLET    Take 2 tablets (650 mg total) by mouth every 6 (six) hours as needed for mild pain or moderate pain (or Fever >/= 101).   AMIODARONE (PACERONE) 200 MG TABLET    Take 1 tablet (200 mg total) by mouth 2 (two) times daily.   CALCIUM ACETATE, PHOS BINDER, (PHOSLYRA) 667 MG/5ML SOLN    3 tablets daily with meals   CAMPHOR-MENTHOL (SARNA) LOTION    Apply topically as needed for itching.   DOXYCYCLINE (VIBRA-TABS) 100 MG TABLET    Take  1 tablet (100 mg total) by mouth every 12 (twelve) hours. For 28 days   FEEDING SUPPLEMENT, RESOURCE BREEZE, (RESOURCE BREEZE) LIQD    Take 1 Container by mouth 3 (three) times daily with meals.   GABAPENTIN (NEURONTIN) 300 MG CAPSULE    Take 300 mg by mouth at bedtime.   HYDROCODONE-ACETAMINOPHEN (NORCO/VICODIN) 5-325 MG PER TABLET    Take one tablet by mouth every 6 hours as needed for moderate or severe pain   INSULIN GLARGINE (LANTUS) 100 UNIT/ML INJECTION    Inject 58 Units into the skin at bedtime.   LIDOCAINE-PRILOCAINE (EMLA) CREAM    Apply 1 application topically 3 (three) times a week. Dialysis med   METOPROLOL (LOPRESSOR) 100 MG  TABLET    Take 1 tablet (100 mg total) by mouth 2 (two) times daily.   OMEPRAZOLE (PRILOSEC) 20 MG CAPSULE    Take 20 mg by mouth daily.   WARFARIN (COUMADIN) 5 MG TABLET    Take 5 mg by mouth daily.  Modified Medications   No medications on file  Discontinued Medications   No medications on file     Physical Exam: Physical Exam  Constitutional: He is oriented to person, place, and time and well-developed, well-nourished, and in no distress.  HENT:  Head: Normocephalic and atraumatic.  Mouth/Throat: Oropharynx is clear and moist. No oropharyngeal exudate.  Eyes: Conjunctivae and EOM are normal. Pupils are equal, round, and reactive to light.  Neck: Normal range of motion. Neck supple. No thyromegaly present.  Cardiovascular: Normal rate and normal heart sounds.   Pulmonary/Chest: Effort normal and breath sounds normal. No respiratory distress.  Abdominal: Soft. Bowel sounds are normal. He exhibits no distension.  Musculoskeletal: He exhibits no edema and no tenderness.  Lymphadenopathy:    He has no cervical adenopathy.  Neurological: He is alert and oriented to person, place, and time.  Skin: Skin is warm and dry. He is not diaphoretic.  Psychiatric: Affect normal.     Filed Vitals:   03/02/13 1156  BP: 134/76  Pulse: 86  Temp: 98 F (36.7 C)  Resp: 20  SpO2: 98%      Labs reviewed: Basic Metabolic Panel:  Recent Labs  02/06/13 0927  02/09/13 0555  02/15/13 0500 02/16/13 0545 02/17/13 0712  NA 140  < > 140  < > 138 137 135  K 4.5  < > 4.3  < > 4.4 4.3 3.7  CL 98  < > 96  < > 96 97 95*  CO2 24  < > 25  < > 25 27 27   GLUCOSE 139*  < > 149*  < > 134* 141* 164*  BUN 36*  < > 37*  < > 23 31* 17  CREATININE 6.02*  < > 6.77*  < > 5.26* 6.69* 4.34*  CALCIUM 9.8  < > 9.8  < > 9.3 9.3 8.8  PHOS 5.4*  --  4.9*  --   --   --  3.0  < > = values in this interval not displayed. Liver Function Tests:  Recent Labs  01/30/13 1541  02/04/13 1242 02/06/13 0927  02/17/13 0712  AST 14  --   --  16  --   ALT 12  --   --  11  --   ALKPHOS 141*  --   --  103  --   BILITOT 1.7*  --   --  0.9  --   PROT 7.3  --   --  6.2  --  ALBUMIN 3.4*  < > 2.5* 2.6*  2.6* 2.3*  < > = values in this interval not displayed. No results found for this basename: LIPASE, AMYLASE,  in the last 8760 hours No results found for this basename: AMMONIA,  in the last 8760 hours CBC:  Recent Labs  02/14/13 0508 02/15/13 0500 02/16/13 0545 02/17/13 0716  WBC 14.8* 12.5* 13.5* 12.8*  NEUTROABS 12.3* 9.9* 11.0*  --   HGB 13.0 13.0 12.8* 13.0  HCT 41.2 41.0 39.9 38.8*  MCV 99.5 98.8 98.8 96.8  PLT 267 229 235 246   Cardiac Enzymes:  Recent Labs  02/03/13 1650 02/03/13 2240 02/04/13 0525  TROPONINI <0.30 <0.30 <0.30   BNP: No components found with this basename: POCBNP,  CBG:  Recent Labs  02/16/13 1623 02/16/13 2206 02/17/13 1153  GLUCAP 112* 171* 120*   TSH: No results found for this basename: TSH,  in the last 8760 hours A1C: Lab Results  Component Value Date   HGBA1C 6.5* 01/31/2013   Lipid Panel: No results found for this basename: CHOL, HDL, LDLCALC, TRIG, CHOLHDL, LDLDIRECT,  in the last 8760 hours   Assessment/Plan 1. Hx of right BKA -stable; wound being followed by treatment nurse -conts on doxycyline   2. Anemia of chronic renal failure -stable upon discharge from hospital; will cont to monitor   3. End stage renal disease -hyperkalemia due to missing dialysis prior to hospitalization, this has been stable - conts HD per nephrology   4. Peripheral vascular disease - stable  5. Type II or unspecified type diabetes mellitus with peripheral circulatory disorders, uncontrolled(250.72) - with multiple episodes of hypoglycemia, pt was reduced to 40 units in the hospital; at some point this was increased back to 58 units and pt is on SSI -pt remains non-complaint with diet -will reduce lantus back down to 40 units at this time and  cont to monitor; will cont with SSI novolog  6. Acute Encephalopathy This has resolved and was multifactorial, due to UTI, sepsis, bil foot infection, uremia, drug side effect, and baseline dementia; in hospital confusion was worse at night cognitive assessment was 11/15 which indicated mild to moderate memory impairment and will be cont to be monitored   A. Fib - He had A. Fib with with RVR with rate >110 that resolved in hospital; rate controlled on  metoprolol 100mg  BID and amiodarone 200mg  BID. Currently coumadin is on hold due to elevation in INR 4.1, repeat PT/INR today which is 2.27; pts coumadin was 6 mg daily prior to being held -will decrease coumadin to 5 mg and recheck INR MWF while on doxycycline

## 2013-03-03 ENCOUNTER — Non-Acute Institutional Stay (SKILLED_NURSING_FACILITY): Payer: Medicare Other | Admitting: Internal Medicine

## 2013-03-03 DIAGNOSIS — E1159 Type 2 diabetes mellitus with other circulatory complications: Secondary | ICD-10-CM

## 2013-03-03 DIAGNOSIS — I4891 Unspecified atrial fibrillation: Secondary | ICD-10-CM

## 2013-03-03 DIAGNOSIS — E1169 Type 2 diabetes mellitus with other specified complication: Secondary | ICD-10-CM

## 2013-03-03 DIAGNOSIS — M869 Osteomyelitis, unspecified: Secondary | ICD-10-CM

## 2013-03-03 DIAGNOSIS — E1129 Type 2 diabetes mellitus with other diabetic kidney complication: Secondary | ICD-10-CM

## 2013-03-03 DIAGNOSIS — E1165 Type 2 diabetes mellitus with hyperglycemia: Secondary | ICD-10-CM

## 2013-03-03 DIAGNOSIS — M9080 Osteopathy in diseases classified elsewhere, unspecified site: Secondary | ICD-10-CM

## 2013-03-03 NOTE — Progress Notes (Signed)
Patient ID: Chris Vance, male   DOB: 28-Sep-1940, 73 y.o.   MRN: [id number]  Facility; [facility] Chief complaint; admission to SNF post admit to [facility] from 12/5 to 12/22  History ; this is a 73 year old man who was admitted with sepsis a deteriorating wounds in his right foot. An MRI of the right foot showed diffuse cellulitis and myositis but no definite osteomyelitis or septic arthritis appear he also had a left MRI of the left forefoot which showed cellulitis and myositis without soft tissue abscess. Signal abnormality in the fifth proximal phalanx could be artifact but could not exclude osteomyelitis  the patient received IV antibiotics per had bilateral lower extremity arteriograms which revealed occlusion of the right superficial femoral and popliteal artery a right BKA was recommended given the severe arterial disease and elected to proceed with a right BKA. He is currently on doxycycline 100 twice a day for 4 weeks for possible ostial of the left fifth digit.  Patient arrived with atrial fibrillation with rapid ventricular rate. He had not taken his beta blockers prior to admission to hospital. He is on Coumadin, Metroprolol 100 twice a day and amiodarone at 200 twice a day.   He is also been hypoglycemic since his arrival in the facility. His Lantus has been readjusted to 40 units subcutaneous each bedtime.  Past Medical History  Diagnosis Date  . Renal Failure on dialysis   . Diabetes mellitus   . Hypertension   . Anemia   . Hyperlipidemia   . Thyroid disease   . Anxiety   . Skin cancer    Past Surgical History  Procedure Laterality Date  . Av fistula placement    . Amputation Right 02/13/2013    Procedure: Right Below Knee Amputation;  Surgeon: Newt Minion, MD;  Location: [place];  Service: Orthopedics;  Laterality: Right;  Right Below Knee Amputation    Medications; amiodarone 200 twice a day, doxycycline 100 every 12 for 28 days, PhosLo I rub 667 per 5 mL 3 times  a day, Neurontin 300 each bedtime, Norco 5/325 1 by mouth every 6, Lantus has been reduced from 58-40 units nightly, Metroprolol 50 twice a day, Prilosec 20 daily, Coumadin 5 mg daily  Social history; patient states he was living on his own in [place] exact functional status is unclear  reports that he has been smoking Cigarettes.  He started smoking about 56 years ago. He has a 55 pack-year smoking history. He has never used smokeless tobacco. He reports that he does not drink alcohol or use illicit drugs.  family history includes Asthma in his father.  Review of systems; Respiratory; no cough no shortness of breath Cardiac no complaints of exertional chest pain GI no abdominal pain or diarrhea Extremities; small open areas on the anterior leg has raised some concern from the patient  Physical Examination Gen.; patient is not in any distress talking reasonably well Respiratory clear entry bilaterally Cardiac pansystolic murmur over the PMI does not radiate Abdomen no liver no spleen no tenderness Extremities; I did not review his outpatient studies he is off to see his orthopedic surgeon today. He has 2 small areas on the anterior left covered with a slight surface eschar. The area over the plantar fifth metatarsal head has some surrounding callus. I don't see any specific open area here but I would like to keep this off loaded especially in his shoe. Mental status; able to give a fairly decent account of himself although he  is vague on some specific questions like his address, exact living circumstances et Ronney Asters. No evidence of depression delirium per  Impression/plan #1 diabetes with peripheral vascular disease. He has some small areas on his left leg. He has severe PAD completely absent pulses. He is at high risk to lose the left leg as well. If indeed he has underlying osteomyelitis in the fifth metatarsal head this makes him even more at risk he is currently on doxycycline #2 atrial  fibrillation rate is controlled he is on Coumadin. #3 chronic renal failure on dialysis #4 multiple rib fractures; I don't see the exact history here although this always raises the possibility of multiple fall #5 hyperlipidemia #6 anemia presumably chronic disease  The patient's exact living circumstances functional status will have the most bearing on whether this man to return to his independent environment. Presumably he was on insulin before her arrival in the hospital

## 2013-03-05 ENCOUNTER — Encounter: Payer: Self-pay | Admitting: Internal Medicine

## 2013-03-05 ENCOUNTER — Ambulatory Visit (INDEPENDENT_AMBULATORY_CARE_PROVIDER_SITE_OTHER): Payer: Medicare Other | Admitting: Internal Medicine

## 2013-03-05 VITALS — BP 120/68 | HR 84 | Temp 97.4°F

## 2013-03-05 DIAGNOSIS — E1159 Type 2 diabetes mellitus with other circulatory complications: Secondary | ICD-10-CM

## 2013-03-05 DIAGNOSIS — I4891 Unspecified atrial fibrillation: Secondary | ICD-10-CM

## 2013-03-05 DIAGNOSIS — N186 End stage renal disease: Secondary | ICD-10-CM

## 2013-03-05 DIAGNOSIS — Z09 Encounter for follow-up examination after completed treatment for conditions other than malignant neoplasm: Secondary | ICD-10-CM

## 2013-03-05 NOTE — Progress Notes (Signed)
Case discussed with Dr. Sadek soon after the resident saw the patient.  We reviewed the resident's history and exam and pertinent patient test results.  I agree with the assessment, diagnosis, and plan of care documented in the resident's note. 

## 2013-03-05 NOTE — Patient Instructions (Addendum)
Was such a pleasure to meet you today and so happy to hear that you're doing much better.  It appears that your INR is being checked and changes are being made by you were nursing home physician and her diabetes seems to be under very good control and no changes need to be made.  In terms of your diet we will make the following changes: -2 L fluid restriction -Increase protein supplementation with Ensure/boost 3 times a day with anywhere between one to 2 servings at each of these sessions -Patient should be allowed to also supplement diet with routines including red meats, fish, nuts  Thank you and have a happy new year!

## 2013-03-05 NOTE — Assessment & Plan Note (Signed)
Patient is anticoagulated and rate controlled. INR is being checked at [facility] most recent 1.9 with warfarin adjustments accordingly. Continue current management.

## 2013-03-05 NOTE — Assessment & Plan Note (Signed)
Per review of nursing home material it seems the patient is achieving good glycemic control and Lantus as already been reduced from 50 units each bedtime to 40 units each bedtime. Would continue current management.

## 2013-03-05 NOTE — Assessment & Plan Note (Signed)
Patient has been tolerating hemodialysis sessions. Feels nursing home is to strict on his fluid and routine intake therefore her parameters for this was communicated with the nursing home. -2 L fluid restriction

## 2013-03-05 NOTE — Progress Notes (Signed)
   Subjective:    Patient ID: Chris Vance, male    DOB: [birth year], 73 y.o.   MRN: [id number]  HPI Chris Vance is a 73 yo man pmh as listed below presents for hospital follow up.   Pt d/c on 02/16/13 following sepsis 2/2 bilateral foot wounds and cellulitis. [facility] a [facility] and finds it "restricting type good and being treated well." The patient has had increased appetite and is eating well but feels that the nursing home is too strict on his fluid intake and protein consumption. Patient has been tolerating hemodialysis well. Per review of 10-12 pages from the nursing home he should appears to be maintaining good glycemic control has even had his Lantus reduced to 40 units. Patient is doing well with physical therapy and continues to be motivated. Patient's spirits are good and he is accepting the new transition her life given his recent BKA. The situation regarding the patient's previous living status keeping its inhabitable nature discussed the patient has come to the realization that he may not be able to go home in the best interest of his safety and well-being.  Review of Systems  Constitutional: Negative for fever, chills, activity change, fatigue and unexpected weight change.  Respiratory: Negative for cough, chest tightness and shortness of breath.   Cardiovascular: Negative for chest pain, palpitations and leg swelling.  Gastrointestinal: Negative for nausea, vomiting, abdominal pain, diarrhea and constipation.  Endocrine: Negative for polydipsia and polyuria.  Musculoskeletal: Negative for arthralgias and myalgias.  Skin: Negative for rash.  Neurological: Negative for weakness and light-headedness.    Past Medical History  Diagnosis Date  . Renal disorder   . Diabetes mellitus   . Hypertension   . Anemia   . Hyperlipidemia   . Thyroid disease   . Anxiety   . Skin cancer    Surgical, social, family hx reviewed.     Objective:   Physical Exam Filed Vitals:   03/05/13 1351  BP: 120/68  Pulse: 84  Temp: 97.4 F (36.3 C)  General: sitting in wheelchair, NAD HEENT: PERRL, EOMI, no scleral icterus Cardiac: irregularly irregular, no rubs, murmurs or gallops Pulm: clear to auscultation bilaterally, no crackles or wheezes or rales, moving normal volumes of air Abd: soft, nontender, nondistended, BS present Ext: warm and well perfused, no pedal edema, right BKA wound wrapped with no active drainage, right arm AV fistula good thrill and bruit, diffuse ecchymosis on right and left UE Neuro: alert and oriented X3, cranial nerves II-XII grossly intact     Assessment & Plan:  Please see problem oriented charting.  Patient discussed with Dr.Rogers

## 2013-03-06 LAB — GLUCOSE, CAPILLARY: Glucose-Capillary: 87 mg/dL (ref 70–99)

## 2013-03-09 ENCOUNTER — Other Ambulatory Visit: Payer: Medicare Other

## 2013-03-09 ENCOUNTER — Ambulatory Visit: Payer: Medicare Other

## 2013-03-17 ENCOUNTER — Non-Acute Institutional Stay (SKILLED_NURSING_FACILITY): Payer: Medicare Other | Admitting: Internal Medicine

## 2013-03-17 DIAGNOSIS — E1159 Type 2 diabetes mellitus with other circulatory complications: Secondary | ICD-10-CM

## 2013-03-17 DIAGNOSIS — I4891 Unspecified atrial fibrillation: Secondary | ICD-10-CM

## 2013-03-17 DIAGNOSIS — E1169 Type 2 diabetes mellitus with other specified complication: Secondary | ICD-10-CM

## 2013-03-17 DIAGNOSIS — M9080 Osteopathy in diseases classified elsewhere, unspecified site: Secondary | ICD-10-CM

## 2013-03-17 DIAGNOSIS — M869 Osteomyelitis, unspecified: Secondary | ICD-10-CM

## 2013-03-17 NOTE — Progress Notes (Signed)
Patient ID: Chris Vance, male   DOB: August 19, 1940, 73 y.o.   MRN: [id number] Facility; [facility] Chief complaint; followup of elevated INR, diabetes, atrial fibrillation History; this is a man who came to the facility earlier this month post right BKA for right foot wounds in the face of severe peripheral vascular disease with occlusion of the right femoral and popliteal arteries. There was also concern about the left fifth metatarsal head plain x-ray showed some her right irregularity in the proximal metaphysis of the fourth metatarsal which could represent a fracture. He continues to have a small wound on the plantar aspect of his left fifth metatarsal head.  Other issues include atrial fibrillation he is on metoprolol amiodarone and Coumadin. As far as I can tell he may have been placed on Coumadin even prior to this hospitalization by cardiology. Therefore the atrial fibrillation is probably not new.  With regards to his diabetes, his Lantus has been dropped to 40 units. Blood sugars appear to be in the mid 100s although it was 202 this morning. I do not see a hemoglobin A1c. He remains on chronic dialysis for end-stage chronic renal disease  He was recently found to have a INR of 6.2 to on Coumadin 5 mg a day. His Coumadin was on hold for 3 days. There was supposed to be a INR yesterday although I don't see this  Medications; list is reviewed  Review of systems Respiratory no complaints of cough or shortness of breath Cardiac no chest pain or palpitation Skin persistent wound on the left fifth plantar metatarsal head  Physical examination Gen. pulse rate is 72 and irregularly irregular. Respirations 18 O2 sat 94% on room Respiratory; clear entry bilaterally no crackles or wheezes Cardiac; heart sounds are irregular there is no murmurs he appears to be euvolemic no S3 Left foot; there is a small wound on the plantar aspect of his left foot. I believe we're only using a hydrocolloid on  this, we'll likely need a light debridement and a change in dressing  Impression/plan #1 atrial fibrillation his rate is controlled #2 iatrogenic elevation of his INR. The cause of this is not completely clear although he is on doxycycline for 28 days due to the concern of underlying osteomyelitis. #3 a CBC was ordered I think in the face of his elevated INR were 6 although he looks fine #4 type 2 diabetes with diabetic neuropathy. We have really reduced his Lantus I think which was 58 units on presentation now down to 40. His blood sugars appear to be under decent control #5 patient has bowel sounds up into his chest I suspect he probably has a hiatal hernia I note that he is on a proton pump inhibitor per

## 2013-03-23 ENCOUNTER — Ambulatory Visit: Payer: Medicare Other

## 2013-04-07 ENCOUNTER — Non-Acute Institutional Stay (SKILLED_NURSING_FACILITY): Payer: Medicare Other | Admitting: Internal Medicine

## 2013-04-07 DIAGNOSIS — E1159 Type 2 diabetes mellitus with other circulatory complications: Secondary | ICD-10-CM

## 2013-04-07 DIAGNOSIS — E1169 Type 2 diabetes mellitus with other specified complication: Secondary | ICD-10-CM

## 2013-04-07 DIAGNOSIS — T8189XA Other complications of procedures, not elsewhere classified, initial encounter: Secondary | ICD-10-CM

## 2013-04-07 DIAGNOSIS — M9080 Osteopathy in diseases classified elsewhere, unspecified site: Secondary | ICD-10-CM

## 2013-04-07 DIAGNOSIS — M869 Osteomyelitis, unspecified: Secondary | ICD-10-CM

## 2013-04-07 NOTE — Progress Notes (Signed)
Patient ID: Chris Vance, male   DOB: 12-Aug-1940, 73 y.o.   MRN: [id number] 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 During that hospitalization he was also noted to have a wound on the plantar aspect of his left fifth metatarsal head. He underwent an extensive workup for this including an MRI of the left foot that raised the question of osteomyelitis. He was given antibiotics and a 25 day course of toxic cycling. He also  has known severe peripheral vascular disease in the left leg. [See results below].  Patient has recently bought a new left shoe. He partially pushes himself around by using the foot the propel the wheelchair  An additional problem is that for the last 4 days the patient has noted right shoulder pain and difficulty moving the shoulder. He thinks this occurred while transferring from bed to wheelchair.      Study status:  Routine.  Procedure:  A vascular evaluation was performed. Image quality was suboptimal. The study was technically limited due to movement.    ABI.     Segmental pressure measurements, ankle-brachial indices, and photoplethysmography.  Location:  Vascular laboratory. Patient status:  Inpatient.  Arterial pressure indices:  +-----------------+-------------------+ Location         Waveform            +-----------------+-------------------+ Right ant tibial Dampened monophasic +-----------------+-------------------+ Right post tibialAbsent              +-----------------+-------------------+ Right 1st toe    Absent              +-----------------+-------------------+ Left ant tibial  Absent              +-----------------+-------------------+ Left post tibial Absent              +-----------------+-------------------+ Left  1st toe     Absent              +-----------------+-------------------+ Arterial flow:  +-------------+-------+------+-------------+---------------+ Location     V sys  V ed  Flow analysisComment         +-------------+-------+------+-------------+---------------+ Right common 98cm/s 8cm/s Triphasic    Mild,           femoral                   waveform     homogeneous                                            plaque.         +-------------+-------+------+-------------+---------------+ Right        90cm/s 14cm/sTriphasic    --------------- profunda                  waveform                     femoral                                                +-------------+-------+------+-------------+---------------+ Right femoral66cm/s 3cm/s Biphasic     --------------- -  proximal                waveform                     +-------------+-------+------+-------------+---------------+ Right femoral-23cm/s-1cm/sBiphasic     Severe, mixed   - mid                     waveforms    plaque.                                   then appears                                           occluded                     +-------------+-------+------+-------------+---------------+ Right        29cm/s 3cm/s Biphasic     Severe, mixed   popliteal -               waveform     plaque.         proximal                                               +-------------+-------+------+-------------+---------------+ Right        -51cm/s0cm/s Monophasic   --------------- popliteal -               waveform                     distal                                                 +-------------+-------+------+-------------+---------------+ Right        11cm/s 6cm/s Monophasic   --------------- posterior                 waveform                     tibial - mid                                            +-------------+-------+------+-------------+---------------+ Right        -16cm/s-1cm/sBiphasic     --------------- peroneal -                waveform                     mid                                                    +-------------+-------+------+-------------+---------------+ Left common  91cm/s 8cm/s Triphasic    --------------- femoral  waveform                     +-------------+-------+------+-------------+---------------+  ------------------------------------------------------------ Summary: Bilateral: ABI not ascertained due to the patient's movements. Right: Mid femoral artery appears occluded.  Other specific details can be found in the table(s) above.    Prepared and Electronically Authenticated by      CLINICAL DATA:  Left foot pain.  Possible fracture.   EXAM: MRI OF THE LEFT FOREFOOT WITHOUT CONTRAST   TECHNIQUE: Multiplanar, multisequence MR imaging was performed. No intravenous contrast was administered.   COMPARISON:  Radiographs 01/31/2013.   FINDINGS: Very limited examination due to patient motion. There is diffuse cellulitic change and mild myositis. Diffuse fatty change involving the foot musculature.   There appears to be soft tissue thickening and mild edema around the lateral aspect of the 5th metatarsal phalangeal joint. There is some signal abnormality in the base of the 5th proximal phalanx. Could not exclude osteomyelitis. No findings to suggest septic arthritis.   No definite acute 4th metatarsal fracture.   IMPRESSION: 1. Limited examination due to motion. 2. Diffuse cellulitis and mild myositis. No discrete soft tissue abscess. 3. Signal abnormality in the 5th proximal phalanx could be artifact but could not exclude osteomyelitis.     Electronically Signed   By: Kalman Jewels M.D.   On: 02/01/2013 15:14     Study status:  Routine.  Procedure:  A vascular evaluation was  performed. Image quality was suboptimal. The study was technically limited due to movement.    ABI.     Segmental pressure measurements, ankle-brachial indices, and photoplethysmography.  Location:  Vascular laboratory. Patient status:  Inpatient.  Arterial pressure indices:  +-----------------+-------------------+ Location         Waveform            +-----------------+-------------------+ Right ant tibial Dampened monophasic +-----------------+-------------------+ Right post tibialAbsent              +-----------------+-------------------+ Right 1st toe    Absent              +-----------------+-------------------+ Left ant tibial  Absent              +-----------------+-------------------+ Left post tibial Absent              +-----------------+-------------------+ Left 1st toe     Absent              +-----------------+-------------------+ Arterial flow:  +-------------+-------+------+-------------+---------------+ Location     V sys  V ed  Flow analysisComment         +-------------+-------+------+-------------+---------------+ Right common 98cm/s 8cm/s Triphasic    Mild,           femoral                   waveform     homogeneous                                            plaque.         +-------------+-------+------+-------------+---------------+ Right        90cm/s 14cm/sTriphasic    --------------- profunda                  waveform                     femoral                                                +-------------+-------+------+-------------+---------------+  Right femoral66cm/s 3cm/s Biphasic     --------------- - proximal                waveform                     +-------------+-------+------+-------------+---------------+ Right femoral-23cm/s-1cm/sBiphasic     Severe, mixed   - mid                     waveforms    plaque.                                   then  appears                                           occluded                     +-------------+-------+------+-------------+---------------+ Right        29cm/s 3cm/s Biphasic     Severe, mixed   popliteal -               waveform     plaque.         proximal                                               +-------------+-------+------+-------------+---------------+ Right        -51cm/s0cm/s Monophasic   --------------- popliteal -               waveform                     distal                                                 +-------------+-------+------+-------------+---------------+ Right        11cm/s 6cm/s Monophasic   --------------- posterior                 waveform                     tibial - mid                                           +-------------+-------+------+-------------+---------------+ Right        -16cm/s-1cm/sBiphasic     --------------- peroneal -                waveform                     mid                                                    +-------------+-------+------+-------------+---------------+ Left common  91cm/s 8cm/s Triphasic    --------------- femoral  waveform                     +-------------+-------+------+-------------+---------------+  ------------------------------------------------------------ Summary: Bilateral: ABI not ascertained due to the patient's movements. Right: Mid femoral artery appears occluded.  Other specific details can be found in the table(s) above.    Prepared and Electronically Authenticated by    Physical examination  Left leg; peripheral pulses are absent.. he has a reasonably small linear wound over the left fifth metatarsal head however there is considerable undermining especially towards the lateral aspect of the foot maybe 2 cm. There is no overt infection here and no drainage. There is also a  superficial area on the outer aspect of his left fifth toe which I think is also a friction/pressure injury due to new shoes.  Right leg; there is several areas along the incision line covered with thick eschar [scab]. Does not look any different from 2 weeks ago. I think this needs to be manually debridement to assess the tissue underneath. I discussed this with the patient and will try to do this next week.   Right shoulder; there is some tenderness and warmth over the lateral aspect of the right shoulder he is unable to move this in abduction or flexion. I suspect that this is a rotator cuff tear And he will need to see orthopedics  Impression/plan #1 Wagner 3 diabetic foot ulcer the plantar aspect of his left fifth metatarsal head. I am going to repeat his inflammatory markers. A repeat MRI is not out of the question. I am not sure it is worthwhile culturing the small wound however I'll try to do that. He will need some form of pressure offloading shoe. I am reluctant to put this man who was trying to rehabilitate as nonweightbearing #2 diabetic-related peripheral vascular disease. This is severe. I am not certain whether he has seen a vascular surgeon however by looking at his arterial Dopplers he has severe distal vascular disease and I suspect this would not be amenable to bypass 1 #3 probable right rotator cuff tear he'll need to see orthopedics #4 areas on his right stump clearly need debridement. Whether there is viable epithelium underneath this will only be determined once that is done. He is certainly not going to be eligible for a prosthesis with the stump and this condition

## 2013-04-13 ENCOUNTER — Non-Acute Institutional Stay (SKILLED_NURSING_FACILITY): Payer: Medicare Other | Admitting: Nurse Practitioner

## 2013-04-13 DIAGNOSIS — N189 Chronic kidney disease, unspecified: Secondary | ICD-10-CM

## 2013-04-13 DIAGNOSIS — M25511 Pain in right shoulder: Secondary | ICD-10-CM

## 2013-04-13 DIAGNOSIS — Z89511 Acquired absence of right leg below knee: Secondary | ICD-10-CM

## 2013-04-13 DIAGNOSIS — N186 End stage renal disease: Secondary | ICD-10-CM

## 2013-04-13 DIAGNOSIS — M25519 Pain in unspecified shoulder: Secondary | ICD-10-CM

## 2013-04-13 DIAGNOSIS — D631 Anemia in chronic kidney disease: Secondary | ICD-10-CM

## 2013-04-13 DIAGNOSIS — N039 Chronic nephritic syndrome with unspecified morphologic changes: Secondary | ICD-10-CM

## 2013-04-13 DIAGNOSIS — E1159 Type 2 diabetes mellitus with other circulatory complications: Secondary | ICD-10-CM

## 2013-04-13 DIAGNOSIS — I739 Peripheral vascular disease, unspecified: Secondary | ICD-10-CM

## 2013-04-13 DIAGNOSIS — S88119A Complete traumatic amputation at level between knee and ankle, unspecified lower leg, initial encounter: Secondary | ICD-10-CM

## 2013-04-13 DIAGNOSIS — I4891 Unspecified atrial fibrillation: Secondary | ICD-10-CM

## 2013-04-13 NOTE — Progress Notes (Signed)
Patient ID: PAT SIRES, male   DOB: 04-28-1940, 73 y.o.   MRN: [id number]    Nursing Home Location:  [place] of Service: SNF ([id number])  PCP: Tamsen Roers, MD  Allergies  Allergen Reactions  . Ace Inhibitors   . Lisinopril Cough    Chief Complaint  Patient presents with  . Medical Managment of Chronic Issues    HPI:  Chris Vance is a 73 y.o. man with PMH of A.fib (on coumadin, BB), ESRD on HD, DM2,   Pt underwent a right BKA on 12/19 due to foot wounds complicated by cellulitis and possible deep infection including OM-- finished month of doxycyline currently stump appears to not be healing properly and will be seen at wound care; pt also with increase shoulder pain on right, possible rotator cuff repair staff working to get pt to ortho for further evaluation. Pt conts to go to dialysis MWF. Staff reports pt blood sugars still will drop easily and lantus is frequently being held  Review of Systems:  Review of Systems  Constitutional: Negative for fever, chills and malaise/fatigue.  Respiratory: Negative for cough and shortness of breath.   Cardiovascular: Negative for chest pain.  Gastrointestinal: Negative for heartburn, abdominal pain and constipation.  Genitourinary: Negative for dysuria.  Musculoskeletal: Positive for joint pain (right shoulder).  Skin: Negative for itching and rash.  Neurological: Negative for weakness.  Psychiatric/Behavioral: Positive for memory loss. Negative for depression. The patient is not nervous/anxious.      Past Medical History  Diagnosis Date  . Renal disorder   . Diabetes mellitus   . Hypertension   . Anemia   . Hyperlipidemia   . Thyroid disease   . Anxiety   . Skin cancer    Past Surgical History  Procedure Laterality Date  . Av fistula placement    . Amputation Right 02/13/2013    Procedure: Right Below Knee Amputation;  Surgeon: Newt Minion, MD;  Location: [facility];  Service: Orthopedics;   Laterality: Right;  Right Below Knee Amputation   Social History:   reports that he has been smoking Cigarettes.  He started smoking about 56 years ago. He has a 55 pack-year smoking history. He has never used smokeless tobacco. He reports that he does not drink alcohol or use illicit drugs.  Family History  Problem Relation Age of Onset  . Asthma Father     Medications: Patient's Medications  New Prescriptions   No medications on file  Previous Medications   ACETAMINOPHEN (TYLENOL) 325 MG TABLET    Take 2 tablets (650 mg total) by mouth every 6 (six) hours as needed for mild pain or moderate pain (or Fever >/= 101).   AMIODARONE (PACERONE) 200 MG TABLET    Take 1 tablet (200 mg total) by mouth 2 (two) times daily.   CALCIUM ACETATE, PHOS BINDER, (PHOSLYRA) 667 MG/5ML SOLN    3 tablets daily with meals   CAMPHOR-MENTHOL (SARNA) LOTION    Apply topically as needed for itching.   FEEDING SUPPLEMENT, RESOURCE BREEZE, (RESOURCE BREEZE) LIQD    Take 1 Container by mouth 3 (three) times daily with meals.   GABAPENTIN (NEURONTIN) 300 MG CAPSULE    Take 300 mg by mouth at bedtime.   HYDROCODONE-ACETAMINOPHEN (NORCO/VICODIN) 5-325 MG PER TABLET    Take one tablet by mouth every 6 hours as needed for moderate or severe pain   INSULIN GLARGINE (LANTUS) 100 UNIT/ML INJECTION    Inject 40  Units into the skin at bedtime.    LIDOCAINE-PRILOCAINE (EMLA) CREAM    Apply 1 application topically 3 (three) times a week. Dialysis med   METOPROLOL (LOPRESSOR) 100 MG TABLET    Take 1 tablet (100 mg total) by mouth 2 (two) times daily.   OMEPRAZOLE (PRILOSEC) 20 MG CAPSULE    Take 20 mg by mouth daily.   WARFARIN (COUMADIN) 5 MG TABLET    Take 4 mg by mouth daily.   Modified Medications   No medications on file  Discontinued Medications   DOXYCYCLINE (VIBRA-TABS) 100 MG TABLET    Take 1 tablet (100 mg total) by mouth every 12 (twelve) hours. For 28 days     Physical Exam:  Filed Vitals:   04/13/13 1152    BP: 126/67  Pulse: 85  Temp: 98 F (36.7 C)  Resp: 20  SpO2: 98%   Physical Exam  Constitutional: He is oriented to person, place, and time and well-developed, well-nourished, and in no distress.  HENT:  Head: Normocephalic and atraumatic.  Mouth/Throat: Oropharynx is clear and moist. No oropharyngeal exudate.  Eyes: Conjunctivae and EOM are normal. Pupils are equal, round, and reactive to light.  Neck: Normal range of motion. Neck supple. No thyromegaly present.  Cardiovascular: Normal rate and normal heart sounds.   Pulmonary/Chest: Effort normal and breath sounds normal. No respiratory distress.  Abdominal: Soft. Bowel sounds are normal. He exhibits no distension.  Musculoskeletal: He exhibits no edema and no tenderness.  Limited ROM and strength to right shoulder; Right BKA  Lymphadenopathy:    He has no cervical adenopathy.  Neurological: He is alert and oriented to person, place, and time.  Skin: Skin is warm and dry.  Psychiatric: Affect normal.      Labs reviewed: Basic Metabolic Panel:  Recent Labs  02/06/13 0927  02/09/13 0555  02/15/13 0500 02/16/13 0545 02/17/13 0712  NA 140  < > 140  < > 138 137 135  K 4.5  < > 4.3  < > 4.4 4.3 3.7  CL 98  < > 96  < > 96 97 95*  CO2 24  < > 25  < > '25 27 27  ' GLUCOSE 139*  < > 149*  < > 134* 141* 164*  BUN 36*  < > 37*  < > 23 31* 17  CREATININE 6.02*  < > 6.77*  < > 5.26* 6.69* 4.34*  CALCIUM 9.8  < > 9.8  < > 9.3 9.3 8.8  PHOS 5.4*  --  4.9*  --   --   --  3.0  < > = values in this interval not displayed. Liver Function Tests:  Recent Labs  01/30/13 1541  02/04/13 1242 02/06/13 0927 02/17/13 0712  AST 14  --   --  16  --   ALT 12  --   --  11  --   ALKPHOS 141*  --   --  103  --   BILITOT 1.7*  --   --  0.9  --   PROT 7.3  --   --  6.2  --   ALBUMIN 3.4*  < > 2.5* 2.6*  2.6* 2.3*  < > = values in this interval not displayed. No results found for this basename: LIPASE, AMYLASE,  in the last 8760 hours No  results found for this basename: AMMONIA,  in the last 8760 hours CBC:  Recent Labs  02/14/13 0508 02/15/13 0500 02/16/13 0545 02/17/13 0716  WBC  14.8* 12.5* 13.5* 12.8*  NEUTROABS 12.3* 9.9* 11.0*  --   HGB 13.0 13.0 12.8* 13.0  HCT 41.2 41.0 39.9 38.8*  MCV 99.5 98.8 98.8 96.8  PLT 267 229 235 246   Cardiac Enzymes:  Recent Labs  02/03/13 1650 02/03/13 2240 02/04/13 0525  TROPONINI <0.30 <0.30 <0.30   BNP: No components found with this basename: POCBNP,  CBG:  Recent Labs  02/16/13 2206 02/17/13 1153 03/05/13 1355  GLUCAP 171* 120* 87   TSH: No results found for this basename: TSH,  in the last 8760 hours A1C: Lab Results  Component Value Date   HGBA1C 6.5* 01/31/2013   CBC with Diff    Result: 04/08/2013 10:56 AM   ( Status: F )     C WBC 11.8   H 4.0-10.5 K/uL SLN   RBC 3.19   L 4.22-5.81 MIL/uL SLN   Hemoglobin 9.8   L 13.0-17.0 g/dL SLN   Hematocrit 30.2   L 39.0-52.0 % SLN   MCV 94.7     78.0-100.0 fL SLN   MCH 30.7     26.0-34.0 pg SLN   MCHC 32.5     30.0-36.0 g/dL SLN   RDW 16.6   H 11.5-15.5 % SLN   Platelet Count 317     150-400 K/uL SLN   Granulocyte % 78   H 43-77 % SLN   Absolute Gran 9.2   H 1.7-7.7 K/uL SLN   Lymph % 9   L 12-46 % SLN   Absolute Lymph 1.0     0.7-4.0 K/uL SLN   Mono % 11     3-12 % SLN   Absolute Mono 1.3   H 0.1-1.0 K/uL SLN   Eos % 2     0-5 % SLN   Absolute Eos 0.2     0.0-0.7 K/uL SLN   Baso % 0     0-1 % SLN   Absolute Baso 0.0     0.0-0.1 K/uL SLN   Smear Review Criteria for review not met  SLN   Sed Rate (ESR)    Result: 04/08/2013 11:11 AM   ( Status: F )       Sed Rate (ESR) 79   H 0-16 mm/hr SLN   Basic Metabolic Panel    Result: 04/08/2013 12:30 PM   ( Status: F )       Sodium 135     135-145 mEq/L SLN   Potassium 5.7   H 3.5-5.3 mEq/L SLN C Chloride 94   L 96-112 mEq/L SLN   CO2 26     19-32 mEq/L SLN   Glucose 133   H 70-99 mg/dL SLN   BUN 43   H 6-23 mg/dL SLN   Creatinine 5.57    H 0.50-1.35 mg/dL SLN   Calcium 9.1     8.4-10.5 mg/dL SLN PT (Prothrombin Time)    Result: 04/13/2013 10:44 AM   ( Status: F )     C Prothrombin Time 39.7   H 11.6-15.2 seconds SLN   INR Assessment/Plan 1. Atrial fibrillation -rate controlled; conts on coumadin  -today INR is 4.3; no signs of bleeding; will hold coumadin for 2 days and recheck on 2/18  2. Type II or unspecified type diabetes mellitus with peripheral circulatory disorders, uncontrolled(250.72) -blood sugars are variable; however staff holding lantus frequently due to low blood sugars  -will decrease lantus to 30 units and cont novolog SSI  3. Peripheral vascular disease -advanced, with left  foot ulcer and right BKA  4. Anemia of chronic renal failure -stable will cont to monitor   5. Hx of right BKA -with wound on stump, previously followed by wound care now going to wound care center for further evaluation and treatment   6. End stage renal disease -on diaysis, MWF  7. Right shoulder pain -most likely rotator cuff tear, staff setting up appt for ortho -pain adequately managed on current medications

## 2013-04-14 ENCOUNTER — Non-Acute Institutional Stay (SKILLED_NURSING_FACILITY): Payer: Medicare Other | Admitting: Internal Medicine

## 2013-04-14 DIAGNOSIS — T8189XA Other complications of procedures, not elsewhere classified, initial encounter: Secondary | ICD-10-CM

## 2013-04-14 DIAGNOSIS — I739 Peripheral vascular disease, unspecified: Secondary | ICD-10-CM

## 2013-04-14 DIAGNOSIS — E1159 Type 2 diabetes mellitus with other circulatory complications: Secondary | ICD-10-CM

## 2013-04-14 NOTE — Progress Notes (Signed)
Patient ID: Chris Vance, male   DOB: 1940/03/12, 73 y.o.   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 During that hospitalization he was also noted to have a wound on the plantar aspect of his left fifth metatarsal head. He underwent an extensive workup for this including an MRI of the left foot that raised the question of osteomyelitis. He was given antibiotics and a 25 day course of doxycycline.Marland Kitchen He also  has known severe peripheral vascular disease in the left leg. [See results below].  Patient has recently bought a new left shoe. He partially pushes himself around by using the foot the propel the wheelchair        Study status:  Routine.  Procedure:  A vascular evaluation was performed. Image quality was suboptimal. The study was technically limited due to movement.    ABI.     Segmental pressure measurements, ankle-brachial indices, and photoplethysmography.  Location:  Vascular laboratory. Patient status:  Inpatient.  Arterial pressure indices:  +-----------------+-------------------+ Location         Waveform            +-----------------+-------------------+ Right ant tibial Dampened monophasic +-----------------+-------------------+ Right post tibialAbsent              +-----------------+-------------------+ Right 1st toe    Absent              +-----------------+-------------------+ Left ant tibial  Absent              +-----------------+-------------------+ Left post tibial Absent              +-----------------+-------------------+ Left 1st toe     Absent              +-----------------+-------------------+ Arterial flow:  +-------------+-------+------+-------------+---------------+ Location     V sys  V ed  Flow analysisComment          +-------------+-------+------+-------------+---------------+ Right common 98cm/s 8cm/s Triphasic    Mild,           femoral                   waveform     homogeneous                                            plaque.         +-------------+-------+------+-------------+---------------+ Right        90cm/s 14cm/sTriphasic    --------------- profunda                  waveform                     femoral                                                +-------------+-------+------+-------------+---------------+ Right femoral66cm/s 3cm/s Biphasic     --------------- - proximal                waveform                     +-------------+-------+------+-------------+---------------+  Right femoral-23cm/s-1cm/sBiphasic     Severe, mixed   - mid                     waveforms    plaque.                                   then appears                                           occluded                     +-------------+-------+------+-------------+---------------+ Right        29cm/s 3cm/s Biphasic     Severe, mixed   popliteal -               waveform     plaque.         proximal                                               +-------------+-------+------+-------------+---------------+ Right        -51cm/s0cm/s Monophasic   --------------- popliteal -               waveform                     distal                                                 +-------------+-------+------+-------------+---------------+ Right        11cm/s 6cm/s Monophasic   --------------- posterior                 waveform                     tibial - mid                                           +-------------+-------+------+-------------+---------------+ Right        -16cm/s-1cm/sBiphasic     --------------- peroneal -                waveform                     mid                                                     +-------------+-------+------+-------------+---------------+ Left common  91cm/s 8cm/s Triphasic    --------------- femoral                   waveform                     +-------------+-------+------+-------------+---------------+  ------------------------------------------------------------ Summary: Bilateral: ABI not ascertained due to the patient's  movements. Right: Mid femoral artery appears occluded.  Other specific details can be found in the table(s) above.    Prepared and Electronically Authenticated by    Physical examination  Left leg; peripheral pulses are absent.. he has a reasonably small linear wound over the left fifth metatarsal head however there is considerable undermining especially towards the lateral aspect of the foot maybe 2 cm. There is no overt infection here and no drainage. There is also a superficial area on the outer aspect of his left fifth toe which I think is also a friction/pressure injury due to new shoes.  Right leg; the areas along his surgical incision of his right stump were anesthetized with topical lidocaine and debridement with a #10 scalpel blade and pickups. We will continue with Santyl   Impression/plan #1 Wagner 3 diabetic foot ulcer the plantar aspect of his left fifth metatarsal head. I am going to repeat his inflammatory markers. A repeat MRI is not out of the question. I am not sure it is worthwhile culturing the small wound however I'll try to do that. He will need some form of pressure offloading shoe. I am reluctant to put this man who was trying to rehabilitate as nonweightbearing #2 diabetic-related peripheral vascular disease. This is severe. I am not certain whether he has seen a vascular surgeon however by looking at his arterial Dopplers he has severe distal vascular disease and I suspect this would not be amenable to bypass #3 probable right rotator cuff tear he'll need to see  orthopedics #4 areas on his right stump clearly need debridement. I have performed a debridement today in the facility removing skin and non viable subcutaneous tissue,

## 2013-04-20 ENCOUNTER — Emergency Department (HOSPITAL_COMMUNITY): Payer: Medicare Other

## 2013-04-20 ENCOUNTER — Observation Stay (HOSPITAL_COMMUNITY)
Admission: EM | Admit: 2013-04-20 | Discharge: 2013-04-21 | Disposition: A | Discharge status: Skilled Nursing Facility | Payer: Medicare Other | Attending: Internal Medicine | Admitting: Internal Medicine

## 2013-04-20 ENCOUNTER — Encounter (HOSPITAL_COMMUNITY): Payer: Self-pay | Admitting: Emergency Medicine

## 2013-04-20 DIAGNOSIS — E079 Disorder of thyroid, unspecified: Secondary | ICD-10-CM | POA: Insufficient documentation

## 2013-04-20 DIAGNOSIS — F29 Unspecified psychosis not due to a substance or known physiological condition: Secondary | ICD-10-CM | POA: Insufficient documentation

## 2013-04-20 DIAGNOSIS — I4892 Unspecified atrial flutter: Secondary | ICD-10-CM | POA: Insufficient documentation

## 2013-04-20 DIAGNOSIS — Z87891 Personal history of nicotine dependence: Secondary | ICD-10-CM | POA: Insufficient documentation

## 2013-04-20 DIAGNOSIS — Z7901 Long term (current) use of anticoagulants: Secondary | ICD-10-CM | POA: Insufficient documentation

## 2013-04-20 DIAGNOSIS — L03119 Cellulitis of unspecified part of limb: Secondary | ICD-10-CM

## 2013-04-20 DIAGNOSIS — R4182 Altered mental status, unspecified: Secondary | ICD-10-CM

## 2013-04-20 DIAGNOSIS — W19XXXA Unspecified fall, initial encounter: Secondary | ICD-10-CM

## 2013-04-20 DIAGNOSIS — Z992 Dependence on renal dialysis: Secondary | ICD-10-CM | POA: Insufficient documentation

## 2013-04-20 DIAGNOSIS — E1159 Type 2 diabetes mellitus with other circulatory complications: Secondary | ICD-10-CM | POA: Diagnosis present

## 2013-04-20 DIAGNOSIS — Z9181 History of falling: Secondary | ICD-10-CM | POA: Insufficient documentation

## 2013-04-20 DIAGNOSIS — S2249XA Multiple fractures of ribs, unspecified side, initial encounter for closed fracture: Secondary | ICD-10-CM | POA: Diagnosis present

## 2013-04-20 DIAGNOSIS — F411 Generalized anxiety disorder: Secondary | ICD-10-CM | POA: Insufficient documentation

## 2013-04-20 DIAGNOSIS — Z794 Long term (current) use of insulin: Secondary | ICD-10-CM | POA: Insufficient documentation

## 2013-04-20 DIAGNOSIS — I4891 Unspecified atrial fibrillation: Secondary | ICD-10-CM | POA: Diagnosis present

## 2013-04-20 DIAGNOSIS — N2581 Secondary hyperparathyroidism of renal origin: Secondary | ICD-10-CM | POA: Insufficient documentation

## 2013-04-20 DIAGNOSIS — E119 Type 2 diabetes mellitus without complications: Secondary | ICD-10-CM | POA: Insufficient documentation

## 2013-04-20 DIAGNOSIS — I509 Heart failure, unspecified: Secondary | ICD-10-CM | POA: Insufficient documentation

## 2013-04-20 DIAGNOSIS — M949 Disorder of cartilage, unspecified: Secondary | ICD-10-CM

## 2013-04-20 DIAGNOSIS — M67919 Unspecified disorder of synovium and tendon, unspecified shoulder: Secondary | ICD-10-CM | POA: Insufficient documentation

## 2013-04-20 DIAGNOSIS — Z89511 Acquired absence of right leg below knee: Secondary | ICD-10-CM

## 2013-04-20 DIAGNOSIS — I120 Hypertensive chronic kidney disease with stage 5 chronic kidney disease or end stage renal disease: Secondary | ICD-10-CM | POA: Insufficient documentation

## 2013-04-20 DIAGNOSIS — S0180XA Unspecified open wound of other part of head, initial encounter: Principal | ICD-10-CM | POA: Insufficient documentation

## 2013-04-20 DIAGNOSIS — Z79899 Other long term (current) drug therapy: Secondary | ICD-10-CM | POA: Insufficient documentation

## 2013-04-20 DIAGNOSIS — W1811XA Fall from or off toilet without subsequent striking against object, initial encounter: Secondary | ICD-10-CM | POA: Insufficient documentation

## 2013-04-20 DIAGNOSIS — J189 Pneumonia, unspecified organism: Secondary | ICD-10-CM | POA: Diagnosis present

## 2013-04-20 DIAGNOSIS — E785 Hyperlipidemia, unspecified: Secondary | ICD-10-CM | POA: Diagnosis present

## 2013-04-20 DIAGNOSIS — S61409A Unspecified open wound of unspecified hand, initial encounter: Secondary | ICD-10-CM | POA: Insufficient documentation

## 2013-04-20 DIAGNOSIS — M719 Bursopathy, unspecified: Secondary | ICD-10-CM | POA: Insufficient documentation

## 2013-04-20 DIAGNOSIS — L02419 Cutaneous abscess of limb, unspecified: Secondary | ICD-10-CM | POA: Insufficient documentation

## 2013-04-20 DIAGNOSIS — Y9210 Unspecified residential institution as the place of occurrence of the external cause: Secondary | ICD-10-CM | POA: Insufficient documentation

## 2013-04-20 DIAGNOSIS — Y835 Amputation of limb(s) as the cause of abnormal reaction of the patient, or of later complication, without mention of misadventure at the time of the procedure: Secondary | ICD-10-CM | POA: Insufficient documentation

## 2013-04-20 DIAGNOSIS — M19019 Primary osteoarthritis, unspecified shoulder: Secondary | ICD-10-CM | POA: Insufficient documentation

## 2013-04-20 DIAGNOSIS — IMO0002 Reserved for concepts with insufficient information to code with codable children: Secondary | ICD-10-CM | POA: Insufficient documentation

## 2013-04-20 DIAGNOSIS — N186 End stage renal disease: Secondary | ICD-10-CM | POA: Diagnosis present

## 2013-04-20 DIAGNOSIS — I5032 Chronic diastolic (congestive) heart failure: Secondary | ICD-10-CM | POA: Diagnosis present

## 2013-04-20 DIAGNOSIS — S61509A Unspecified open wound of unspecified wrist, initial encounter: Secondary | ICD-10-CM | POA: Insufficient documentation

## 2013-04-20 DIAGNOSIS — IMO0001 Reserved for inherently not codable concepts without codable children: Secondary | ICD-10-CM | POA: Insufficient documentation

## 2013-04-20 DIAGNOSIS — J438 Other emphysema: Secondary | ICD-10-CM | POA: Insufficient documentation

## 2013-04-20 DIAGNOSIS — M899 Disorder of bone, unspecified: Secondary | ICD-10-CM | POA: Insufficient documentation

## 2013-04-20 DIAGNOSIS — T8740 Infection of amputation stump, unspecified extremity: Secondary | ICD-10-CM | POA: Insufficient documentation

## 2013-04-20 DIAGNOSIS — S88119A Complete traumatic amputation at level between knee and ankle, unspecified lower leg, initial encounter: Secondary | ICD-10-CM | POA: Insufficient documentation

## 2013-04-20 DIAGNOSIS — E860 Dehydration: Secondary | ICD-10-CM | POA: Insufficient documentation

## 2013-04-20 LAB — GLUCOSE, CAPILLARY
GLUCOSE-CAPILLARY: 68 mg/dL — AB (ref 70–99)
GLUCOSE-CAPILLARY: 126 mg/dL — AB (ref 70–99)
Glucose-Capillary: 103 mg/dL — ABNORMAL HIGH (ref 70–99)

## 2013-04-20 LAB — CBC WITH DIFFERENTIAL/PLATELET
BASOS PCT: 0 % (ref 0–1)
Basophils Absolute: 0 10*3/uL (ref 0.0–0.1)
Eosinophils Absolute: 0 10*3/uL (ref 0.0–0.7)
Eosinophils Relative: 0 % (ref 0–5)
HCT: 32.7 % — ABNORMAL LOW (ref 39.0–52.0)
Hemoglobin: 10.8 g/dL — ABNORMAL LOW (ref 13.0–17.0)
LYMPHS ABS: 0.7 10*3/uL (ref 0.7–4.0)
Lymphocytes Relative: 4 % — ABNORMAL LOW (ref 12–46)
MCH: 31.9 pg (ref 26.0–34.0)
MCHC: 33 g/dL (ref 30.0–36.0)
MCV: 96.5 fL (ref 78.0–100.0)
Monocytes Absolute: 1.7 10*3/uL — ABNORMAL HIGH (ref 0.1–1.0)
Monocytes Relative: 11 % (ref 3–12)
NEUTROS ABS: 13.6 10*3/uL — AB (ref 1.7–7.7)
NEUTROS PCT: 85 % — AB (ref 43–77)
Platelets: 345 10*3/uL (ref 150–400)
RBC: 3.39 MIL/uL — AB (ref 4.22–5.81)
RDW: 17.2 % — ABNORMAL HIGH (ref 11.5–15.5)
WBC: 16.1 10*3/uL — ABNORMAL HIGH (ref 4.0–10.5)

## 2013-04-20 LAB — COMPREHENSIVE METABOLIC PANEL
ALBUMIN: 2.4 g/dL — AB (ref 3.5–5.2)
ALK PHOS: 137 U/L — AB (ref 39–117)
ALT: 9 U/L (ref 0–53)
AST: 12 U/L (ref 0–37)
BILIRUBIN TOTAL: 0.7 mg/dL (ref 0.3–1.2)
BUN: 53 mg/dL — AB (ref 6–23)
CHLORIDE: 93 meq/L — AB (ref 96–112)
CO2: 25 mEq/L (ref 19–32)
Calcium: 9.8 mg/dL (ref 8.4–10.5)
Creatinine, Ser: 5.94 mg/dL — ABNORMAL HIGH (ref 0.50–1.35)
GFR calc Af Amer: 10 mL/min — ABNORMAL LOW (ref >90)
GFR calc non Af Amer: 9 mL/min — ABNORMAL LOW (ref >90)
GLUCOSE: 85 mg/dL (ref 70–99)
POTASSIUM: 4.7 meq/L (ref 3.7–5.3)
SODIUM: 137 meq/L (ref 137–147)
Total Protein: 6.9 g/dL (ref 6.0–8.3)

## 2013-04-20 LAB — CBG MONITORING, ED: Glucose-Capillary: 94 mg/dL (ref 70–99)

## 2013-04-20 LAB — PROTIME-INR
INR: 2.76 — ABNORMAL HIGH (ref 0.00–1.49)
PROTHROMBIN TIME: 28.2 s — AB (ref 11.6–15.2)

## 2013-04-20 LAB — TROPONIN I: Troponin I: <0.3 ng/mL (ref <0.30)

## 2013-04-20 LAB — MRSA PCR SCREENING: MRSA BY PCR: NEGATIVE

## 2013-04-20 LAB — APTT: aPTT: 48 seconds — ABNORMAL HIGH (ref 24–37)

## 2013-04-20 MED ORDER — AMIODARONE HCL 200 MG PO TABS
200.0000 mg | ORAL_TABLET | Freq: Two times a day (BID) | ORAL | Status: DC
  Administered 2013-04-20 – 2013-04-21 (×2): 200 mg via ORAL
  Filled 2013-04-20 (×3): qty 1

## 2013-04-20 MED ORDER — LEVOFLOXACIN 500 MG PO TABS: 500.0000 mg | ORAL_TABLET | ORAL | Status: DC

## 2013-04-20 MED ORDER — LIDOCAINE-PRILOCAINE 2.5-2.5 % EX CREA: 1.0000 "application " | TOPICAL_CREAM | CUTANEOUS | Status: DC

## 2013-04-20 MED ORDER — PANTOPRAZOLE SODIUM 40 MG PO TBEC
40.0000 mg | DELAYED_RELEASE_TABLET | Freq: Every day | ORAL | Status: DC
  Administered 2013-04-21: 40 mg via ORAL
  Filled 2013-04-20: qty 1

## 2013-04-20 MED ORDER — HYDROCODONE-ACETAMINOPHEN 5-325 MG PO TABS: 1.0000 | ORAL_TABLET | Freq: Four times a day (QID) | ORAL | Status: DC | PRN

## 2013-04-20 MED ORDER — SODIUM CHLORIDE 0.9 % IV BOLUS (SEPSIS)
500.0000 mL | Freq: Once | INTRAVENOUS | Status: AC
  Administered 2013-04-20: 500 mL via INTRAVENOUS

## 2013-04-20 MED ORDER — ACETAMINOPHEN 325 MG PO TABS: 650.0000 mg | ORAL_TABLET | Freq: Four times a day (QID) | ORAL | Status: DC | PRN

## 2013-04-20 MED ORDER — INSULIN GLARGINE 100 UNIT/ML ~~LOC~~ SOLN
35.0000 [IU] | Freq: Every day | SUBCUTANEOUS | Status: DC
  Filled 2013-04-20: qty 0.35

## 2013-04-20 MED ORDER — CALCIUM ACETATE (PHOS BINDER) 667 MG/5ML PO SOLN
2001.0000 mg | Freq: Three times a day (TID) | ORAL | Status: DC
  Administered 2013-04-21: 2001 mg via ORAL
  Filled 2013-04-20 (×8): qty 15

## 2013-04-20 MED ORDER — WARFARIN SODIUM 1 MG PO TABS
1.0000 mg | ORAL_TABLET | Freq: Once | ORAL | Status: AC
  Administered 2013-04-20: 1 mg via ORAL
  Filled 2013-04-20: qty 1

## 2013-04-20 MED ORDER — GABAPENTIN 300 MG PO CAPS
300.0000 mg | ORAL_CAPSULE | Freq: Every day | ORAL | Status: DC
  Administered 2013-04-20: 300 mg via ORAL
  Filled 2013-04-20 (×2): qty 1

## 2013-04-20 MED ORDER — BOOST / RESOURCE BREEZE PO LIQD
1.0000 | Freq: Three times a day (TID) | ORAL | Status: DC
  Administered 2013-04-21 (×3): 1 via ORAL

## 2013-04-20 MED ORDER — METOPROLOL TARTRATE 100 MG PO TABS
100.0000 mg | ORAL_TABLET | Freq: Two times a day (BID) | ORAL | Status: DC
  Administered 2013-04-20 – 2013-04-21 (×2): 100 mg via ORAL
  Filled 2013-04-20 (×3): qty 1

## 2013-04-20 MED ORDER — DEXTROSE 50 % IV SOLN
INTRAVENOUS | Status: AC
  Administered 2013-04-20: 50 mL
  Filled 2013-04-20: qty 50

## 2013-04-20 MED ORDER — LEVOFLOXACIN IN D5W 750 MG/150ML IV SOLN
750.0000 mg | Freq: Once | INTRAVENOUS | Status: AC
  Administered 2013-04-20: 750 mg via INTRAVENOUS
  Filled 2013-04-20 (×2): qty 150

## 2013-04-20 MED ORDER — INSULIN ASPART 100 UNIT/ML ~~LOC~~ SOLN
0.0000 [IU] | Freq: Every day | SUBCUTANEOUS | Status: DC
Start: 2013-04-20 — End: 2013-04-21

## 2013-04-20 MED ORDER — INSULIN ASPART 100 UNIT/ML ~~LOC~~ SOLN: 0.0000 [IU] | Freq: Three times a day (TID) | SUBCUTANEOUS | Status: DC

## 2013-04-20 MED ORDER — WARFARIN - PHARMACIST DOSING INPATIENT
Freq: Every day | Status: DC
  Administered 2013-04-21: 18:00:00

## 2013-04-20 NOTE — ED Notes (Addendum)
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 Bruit/thrill present. Small skin tear to his left hand with bleeding. Patient has had nasal congestion for apprx 1 week. He is having to breath threw his mouth at this time, suction is at bedside and on.

## 2013-04-20 NOTE — ED Provider Notes (Signed)
CSN: [id number]     Arrival date & time 04/20/13  1106 History   First MD Initiated Contact with Patient 04/20/13 1107     Chief Complaint  Patient presents with  . Fall     (Consider location/radiation/quality/duration/timing/severity/associated sxs/prior Treatment) The history is provided by the patient. No language interpreter was used.  Chris Vance is a 73 y.o M with PMHx of DM, HTN, HLD, anemia, thyroid disease, anxiety, ESRD on dialysis (due to get dialysis today) presenting to the ED BIBEMS from [facility] after a fall that occurred prior to arrival to the ED. Patient reported that he was sitting on the toilet and when he was about to get up lost his balance and hit his head on the tile floor. Patient currently on Coumadin therapy. Patient reported that he has pain in his neck and in his right hand where a superficial abrasion is noted. Patient reported that he right wrist pain as well because he tried to catch himself. Denied loss of consciousness, chest pain, short of breath, difficulty breathing, numbness, tingling. PCP Dr. Rex Kras  Past Medical History  Diagnosis Date  . Renal disorder   . Diabetes mellitus   . Hypertension   . Anemia   . Hyperlipidemia   . Thyroid disease   . Anxiety   . Skin cancer    Past Surgical History  Procedure Laterality Date  . Av fistula placement    . Amputation Right 02/13/2013    Procedure: Right Below Knee Amputation;  Surgeon: Newt Minion, MD;  Location: [place];  Service: Orthopedics;  Laterality: Right;  Right Below Knee Amputation   Family History  Problem Relation Age of Onset  . Asthma Father    History  Substance Use Topics  . Smoking status: Former Smoker -- 1.00 packs/day for 55 years    Types: Cigarettes    Start date: 02/26/1957  . Smokeless tobacco: Never Used     Comment: No cigarettes x 3-4 weeks.  . Alcohol Use: No    Review of Systems  Constitutional: Negative for fever and chills.   Respiratory: Negative for chest tightness and shortness of breath.   Cardiovascular: Negative for chest pain.  Gastrointestinal: Negative for nausea and vomiting.  Musculoskeletal: Positive for arthralgias (Right hand and wrist), back pain and neck pain.  Neurological: Negative for dizziness and weakness.  All other systems reviewed and are negative.      Allergies  Ace inhibitors and Lisinopril  Home Medications   Current Outpatient Rx  Name  Route  Sig  Dispense  Refill  . acetaminophen (TYLENOL) 325 MG tablet   Oral   Take 2 tablets (650 mg total) by mouth every 6 (six) hours as needed for mild pain or moderate pain (or Fever >/= 101).   120 tablet   0   . amiodarone (PACERONE) 200 MG tablet   Oral   Take 1 tablet (200 mg total) by mouth 2 (two) times daily.   60 tablet   0   . calcium acetate, Phos Binder, (PHOSLYRA) 667 MG/5ML SOLN      3 tablets daily with meals         . camphor-menthol (SARNA) lotion   Topical   Apply topically as needed for itching.   222 mL   0   . feeding supplement, RESOURCE BREEZE, (RESOURCE BREEZE) LIQD   Oral   Take 1 Container by mouth 3 (three) times daily with meals.   [place]  0   . gabapentin (NEURONTIN) 300 MG capsule   Oral   Take 300 mg by mouth at bedtime.         Marland Kitchen HYDROcodone-acetaminophen (NORCO/VICODIN) 5-325 MG per tablet      Take one tablet by mouth every 6 hours as needed for moderate or severe pain   120 tablet   0   . insulin glargine (LANTUS) 100 UNIT/ML injection   Subcutaneous   Inject 40 Units into the skin at bedtime.          . lidocaine-prilocaine (EMLA) cream   Topical   Apply 1 application topically 3 (three) times a week. Dialysis med         . metoprolol (LOPRESSOR) 100 MG tablet   Oral   Take 1 tablet (100 mg total) by mouth 2 (two) times daily.   60 tablet   0   . omeprazole (PRILOSEC) 20 MG capsule   Oral   Take 20 mg by mouth daily.         Marland Kitchen warfarin  (COUMADIN) 5 MG tablet   Oral   Take 4 mg by mouth daily.           BP 133/68  Pulse 77  Resp 12  SpO2 97% Physical Exam  Nursing note and vitals reviewed. Constitutional: He is oriented to person, place, and time. He appears well-developed and well-nourished. No distress.  HENT:  Head: Normocephalic.  Mouth/Throat: No oropharyngeal exudate.  Superficial abrasions noted to the forehead - above right eyebrow and nasal bridge Dry mucous membranes  Eyes: Conjunctivae and EOM are normal. Pupils are equal, round, and reactive to light. Right eye exhibits no discharge. Left eye exhibits no discharge.  Negative nystagmus Visual fields grossly intact Negative signs of entrapment  Neck:  Patient in c-collar  Cardiovascular: Normal rate, regular rhythm and normal heart sounds.  Exam reveals no friction rub.   No murmur heard. Pulses:      Radial pulses are 2+ on the right side, and 2+ on the left side.       Dorsalis pedis pulses are 2+ on the left side.  Cap refill less than 3 seconds  Pulmonary/Chest: Effort normal and breath sounds normal. No respiratory distress. He has no wheezes. He has no rales.  Musculoskeletal: Normal range of motion. He exhibits tenderness.  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 Patient is able to move digits without difficulty to the right hand. Discomfort upon palpation to the right hand and right wrist. Patient able to produce a cyst. Full ROM to upper and lower extremities without difficulty noted, negative ataxia noted.  Neurological: He is alert and oriented to person, place, and time. No cranial nerve deficit. He exhibits normal muscle tone. Coordination normal.  Patient knows where he is, who he is, state, city, year Negative facial drooping Negative slurred speech Patient responds to questions appropriately and  accurately Cranial nerves III-XII grossly intact Strength 5+/5+ to upper bilaterally with resistance applied, equal distribution noted - mildly decreased to the right hand secondary to pain Strength intact to the left lower extremity with resistance applied Strength intact to MCP, PIP, DIP joints of left hand and right Sensation intact   Skin: He is not diaphoretic.  Superficial abrasion localized to the dorsal aspect of the right hand at the base of the thumb was bleeding moderately controlled.  Stump noted to the right lower extremity with BKA performed 3-4 months. Negative swelling or cellulitic findings noted - healing well. Negative active drainage or bleeding noted.   Psychiatric: He has a normal mood and affect. His behavior is normal. Thought content normal.    ED Course  Procedures (including critical care time)  3:03 PM Patient's brother asked to speak with this provider. Reported that patient has been acting out of sorts for the past 2-3 days. Reported that he has been having symptoms of altered mental status - stated that he has not been making sense with taking.   3:13 PM This provider spoke with Dr. Gevena Barre - discussed history, presentation, labs, imaging in great detail. Patient to be admitted to observation Telemetry bed.   Results for orders placed during the hospital encounter of 04/20/13  CBC WITH DIFFERENTIAL      Result Value Ref Range   WBC 16.1 (*) 4.0 - 10.5 K/uL   RBC 3.39 (*) 4.22 - 5.81 MIL/uL   Hemoglobin 10.8 (*) 13.0 - 17.0 g/dL   HCT 32.7 (*) 39.0 - 52.0 %   MCV 96.5  78.0 - 100.0 fL   MCH 31.9  26.0 - 34.0 pg   MCHC 33.0  30.0 - 36.0 g/dL   RDW 17.2 (*) 11.5 - 15.5 %   Platelets 345  150 - 400 K/uL   Neutrophils Relative % 85 (*) 43 - 77 %   Neutro Abs 13.6 (*) 1.7 - 7.7 K/uL   Lymphocytes Relative 4 (*) 12 - 46 %   Lymphs Abs 0.7  0.7 - 4.0 K/uL   Monocytes Relative 11  3 - 12 %   Monocytes Absolute 1.7 (*) 0.1 - 1.0 K/uL   Eosinophils  Relative 0  0 - 5 %   Eosinophils Absolute 0.0  0.0 - 0.7 K/uL   Basophils Relative 0  0 - 1 %   Basophils Absolute 0.0  0.0 - 0.1 K/uL  COMPREHENSIVE METABOLIC PANEL      Result Value Ref Range   Sodium 137  137 - 147 mEq/L   Potassium 4.7  3.7 - 5.3 mEq/L   Chloride 93 (*) 96 - 112 mEq/L   CO2 25  19 - 32 mEq/L   Glucose, Bld 85  70 - 99 mg/dL   BUN 53 (*) 6 - 23 mg/dL   Creatinine, Ser 5.94 (*) 0.50 - 1.35 mg/dL   Calcium 9.8  8.4 - 10.5 mg/dL   Total Protein 6.9  6.0 - 8.3 g/dL   Albumin 2.4 (*) 3.5 - 5.2 g/dL   AST 12  0 - 37 U/L   ALT 9  0 - 53 U/L   Alkaline Phosphatase 137 (*) 39 - 117 U/L   Total Bilirubin 0.7  0.3 - 1.2 mg/dL   GFR calc non Af Amer 9 (*) >90 mL/min   GFR calc Af Amer 10 (*) >90 mL/min  PROTIME-INR      Result Value Ref Range   Prothrombin Time 28.2 (*) 11.6 - 15.2 seconds   INR 2.76 (*) 0.00 - 1.49  APTT      Result Value Ref Range   aPTT 48 (*) 24 - 37 seconds  TROPONIN I      Result Value Ref Range   Troponin I <0.30  <0.30 ng/mL  CBG MONITORING, ED      Result Value Ref Range   Glucose-Capillary 94  70 - 99 mg/dL   Dg Thoracic Spine 2  View  04/20/2013   CLINICAL DATA:  Fall.  EXAM: THORACIC SPINE - 2 VIEW  COMPARISON:  PA and lateral chest 01/29/2013.  FINDINGS: No fracture is identified. Marked multilevel degenerative change is seen. Aortic atherosclerosis is noted.  IMPRESSION: No acute finding.   Electronically Signed   By: Inge Rise M.D.   On: 04/20/2013 13:46   Dg Shoulder Right  04/20/2013   CLINICAL DATA:  Fall.  EXAM: RIGHT SHOULDER - 2+ VIEW  COMPARISON:  None.  FINDINGS: The humerus is located and the acromioclavicular joint is intact. The humerus is high-riding consistent with chronic rotator cuff tear. Acromioclavicular degenerative change is seen. There are subacute to remote fractures of the right third and fourth ribs.  IMPRESSION: No acute finding.  Subacute to remote right third and fourth rib fractures.  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   Electronically Signed   By: Inge Rise M.D.   On: 04/20/2013 13:48   Dg Wrist Complete Right  04/20/2013   CLINICAL DATA:  [place].  Wrist and hand pain.  Lacerations.  EXAM: RIGHT WRIST - COMPLETE 3+ VIEW  COMPARISON:  Views of the hand on the same day  FINDINGS: There is normal alignment of the carpals. No evidence for acute fracture or dislocation.  There are surgical clips in the region of the wrist. A soft tissue mass projects over the soft tissues adjacent to the distal radius, measuring 4.9 x 3.5 cm. Scar represent hematoma or other soft tissue mass. There is dense atherosclerotic calcification of the small vessels in the region of the wrist and hand.  IMPRESSION: 1. No evidence for acute fracture. 2. Soft tissue mass or hematoma adjacent to the distal radius. 3. Postoperative changes.   Electronically Signed   By: Shon Hale M.D.   On: 04/20/2013 13:51   Ct Head Wo Contrast  04/20/2013   CLINICAL DATA:  Status post fall with a blow to the head. The patient is anticoagulated. Laceration above right eyebrow.  EXAM: CT HEAD WITHOUT CONTRAST  CT CERVICAL SPINE WITHOUT CONTRAST  TECHNIQUE: Multidetector CT imaging of the head and cervical spine was performed following the standard protocol without intravenous contrast. Multiplanar CT image reconstructions of the cervical spine were also generated.  COMPARISON:  Head CT scan 01/30/2013.  FINDINGS: CT HEAD FINDINGS  Laceration above the right eye is seen. Cortical atrophy and chronic microvascular ischemic change are identified. There is no evidence of acute intracranial abnormality including infarct, hemorrhage, mass lesion, mass effect, midline shift or abnormal extra-axial fluid collection. No hydrocephalus or pneumocephalus. The calvarium is intact. Small mucous retention cysts or polyps are seen in the left maxillary sinus. Mild mucosal thickening right maxillary sinus is  identified. No fracture is seen.  CT CERVICAL SPINE FINDINGS  No fracture or malalignment is identified. The patient has marked multilevel degenerative disease. Lung apices demonstrate extensive emphysema. Bilateral upper lobe airspace disease is present, worse on the left.  IMPRESSION: Laceration above the right eye without underlying fracture or acute intracranial abnormality.  No acute abnormality cervical spine.  Emphysema with extensive bilateral upper lobe airspace disease most compatible with pneumonia.  Atrophy and microvascular ischemic change.  Multilevel spondylosis.   Electronically Signed   By: Inge Rise M.D.   On: 04/20/2013 13:31   Ct Cervical Spine Wo Contrast  04/20/2013   CLINICAL DATA:  Status post fall with a blow to the head. The patient is anticoagulated. Laceration above right eyebrow.  EXAM: CT HEAD WITHOUT CONTRAST  CT CERVICAL SPINE WITHOUT CONTRAST  TECHNIQUE: Multidetector CT imaging of the head and cervical spine was performed following the standard protocol without intravenous contrast. Multiplanar CT image reconstructions of the cervical spine were also generated.  COMPARISON:  Head CT scan 01/30/2013.  FINDINGS: CT HEAD FINDINGS  Laceration above the right eye is seen. Cortical atrophy and chronic microvascular ischemic change are identified. There is no evidence of acute intracranial abnormality including infarct, hemorrhage, mass lesion, mass effect, midline shift or abnormal extra-axial fluid collection. No hydrocephalus or pneumocephalus. The calvarium is intact. Small mucous retention cysts or polyps are seen in the left maxillary sinus. Mild mucosal thickening right maxillary sinus is identified. No fracture is seen.  CT CERVICAL SPINE FINDINGS  No fracture or malalignment is identified. The patient has marked multilevel degenerative disease. Lung apices demonstrate extensive emphysema. Bilateral upper lobe airspace disease is present, worse on the left.  IMPRESSION:  Laceration above the right eye without underlying fracture or acute intracranial abnormality.  No acute abnormality cervical spine.  Emphysema with extensive bilateral upper lobe airspace disease most compatible with pneumonia.  Atrophy and microvascular ischemic change.  Multilevel spondylosis.   Electronically Signed   By: Inge Rise M.D.   On: 04/20/2013 13:31   Dg Chest Port 1 View  04/20/2013   CLINICAL DATA:  Status post fall  EXAM: PORTABLE CHEST - 1 VIEW  COMPARISON:  DG THORACIC SPINE dated 04/20/2013; DG RIBS BILATERAL W/CHEST dated 01/30/2013; DG CHEST 2 VIEW dated 01/29/2013  FINDINGS: The lungs are well-expanded. The interstitial markings are increased bilaterally as compared to the previous studies. The cardiopericardial silhouette remains enlarged. The pulmonary vascularity is indistinct. There is no pleural effusion. The observed portions of the bony thorax exhibit no acute abnormalities.  IMPRESSION: Increased interstitial markings bilaterally are worrisome for pulmonary interstitial edema likely secondary to cardiac etiologies. No alveolar pneumonia is demonstrated.   Electronically Signed   By: David  Martinique   On: 04/20/2013 15:18   Dg Hand Complete Right  04/20/2013   CLINICAL DATA:  [place]. Lacerations of the right hand and wrist. Wrist pain.  EXAM: RIGHT HAND - COMPLETE 3+ VIEW  COMPARISON:  None.  FINDINGS: There is a large soft tissue mass along the radial aspect of the distal forearm. No radiopaque foreign body identified. Surgical clips are identified in the wrist. There is no evidence for acute fracture or dislocation. Degenerative changes are present. These primarily involve the distal interphalangeal joints in the radiocarpal joint. There is significant atherosclerotic calcification of the small vessels.  IMPRESSION: 1.  No evidence for acute osseous abnormality. 2. Large soft tissue swelling/mass involving the distal aspect of the forearm. 3. Postoperative changes.    Electronically Signed   By: Shon Hale M.D.   On: 04/20/2013 13:49   Labs Review Labs Reviewed  CBC WITH DIFFERENTIAL - Abnormal; Notable for the following:    WBC 16.1 (*)    RBC 3.39 (*)    Hemoglobin 10.8 (*)    HCT 32.7 (*)    RDW 17.2 (*)    Neutrophils Relative % 85 (*)    Neutro Abs 13.6 (*)    Lymphocytes Relative 4 (*)    Monocytes Absolute 1.7 (*)    All other components within normal limits  COMPREHENSIVE METABOLIC PANEL - Abnormal; Notable for the following:    Chloride 93 (*)    BUN 53 (*)    Creatinine, Ser 5.94 (*)  Albumin 2.4 (*)    Alkaline Phosphatase 137 (*)    GFR calc non Af Amer 9 (*)    GFR calc Af Amer 10 (*)    All other components within normal limits  PROTIME-INR - Abnormal; Notable for the following:    Prothrombin Time 28.2 (*)    INR 2.76 (*)    All other components within normal limits  APTT - Abnormal; Notable for the following:    aPTT 48 (*)    All other components within normal limits  TROPONIN I  CBG MONITORING, ED   Imaging Review Dg Thoracic Spine 2 View  04/20/2013   CLINICAL DATA:  Fall.  EXAM: THORACIC SPINE - 2 VIEW  COMPARISON:  PA and lateral chest 01/29/2013.  FINDINGS: No fracture is identified. Marked multilevel degenerative change is seen. Aortic atherosclerosis is noted.  IMPRESSION: No acute finding.   Electronically Signed   By: Inge Rise M.D.   On: 04/20/2013 13:46   Dg Shoulder Right  04/20/2013   CLINICAL DATA:  Fall.  EXAM: RIGHT SHOULDER - 2+ VIEW  COMPARISON:  None.  FINDINGS: The humerus is located and the acromioclavicular joint is intact. The humerus is high-riding consistent with chronic rotator cuff tear. Acromioclavicular degenerative change is seen. There are subacute to remote fractures of the right third and fourth ribs.  IMPRESSION: No acute finding.  Subacute to remote right third and fourth rib fractures.  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    Electronically Signed   By: Inge Rise M.D.   On: 04/20/2013 13:48   Dg Wrist Complete Right  04/20/2013   CLINICAL DATA:  [place].  Wrist and hand pain.  Lacerations.  EXAM: RIGHT WRIST - COMPLETE 3+ VIEW  COMPARISON:  Views of the hand on the same day  FINDINGS: There is normal alignment of the carpals. No evidence for acute fracture or dislocation.  There are surgical clips in the region of the wrist. A soft tissue mass projects over the soft tissues adjacent to the distal radius, measuring 4.9 x 3.5 cm. Scar represent hematoma or other soft tissue mass. There is dense atherosclerotic calcification of the small vessels in the region of the wrist and hand.  IMPRESSION: 1. No evidence for acute fracture. 2. Soft tissue mass or hematoma adjacent to the distal radius. 3. Postoperative changes.   Electronically Signed   By: Shon Hale M.D.   On: 04/20/2013 13:51   Ct Head Wo Contrast  04/20/2013   CLINICAL DATA:  Status post fall with a blow to the head. The patient is anticoagulated. Laceration above right eyebrow.  EXAM: CT HEAD WITHOUT CONTRAST  CT CERVICAL SPINE WITHOUT CONTRAST  TECHNIQUE: Multidetector CT imaging of the head and cervical spine was performed following the standard protocol without intravenous contrast. Multiplanar CT image reconstructions of the cervical spine were also generated.  COMPARISON:  Head CT scan 01/30/2013.  FINDINGS: CT HEAD FINDINGS  Laceration above the right eye is seen. Cortical atrophy and chronic microvascular ischemic change are identified. There is no evidence of acute intracranial abnormality including infarct, hemorrhage, mass lesion, mass effect, midline shift or abnormal extra-axial fluid collection. No hydrocephalus or pneumocephalus. The calvarium is intact. Small mucous retention cysts or polyps are seen in the left maxillary sinus. Mild mucosal thickening right maxillary sinus is identified. No fracture is seen.  CT CERVICAL SPINE FINDINGS  No fracture or  malalignment is identified. The patient has marked multilevel degenerative  disease. Lung apices demonstrate extensive emphysema. Bilateral upper lobe airspace disease is present, worse on the left.  IMPRESSION: Laceration above the right eye without underlying fracture or acute intracranial abnormality.  No acute abnormality cervical spine.  Emphysema with extensive bilateral upper lobe airspace disease most compatible with pneumonia.  Atrophy and microvascular ischemic change.  Multilevel spondylosis.   Electronically Signed   By: Inge Rise M.D.   On: 04/20/2013 13:31   Ct Cervical Spine Wo Contrast  04/20/2013   CLINICAL DATA:  Status post fall with a blow to the head. The patient is anticoagulated. Laceration above right eyebrow.  EXAM: CT HEAD WITHOUT CONTRAST  CT CERVICAL SPINE WITHOUT CONTRAST  TECHNIQUE: Multidetector CT imaging of the head and cervical spine was performed following the standard protocol without intravenous contrast. Multiplanar CT image reconstructions of the cervical spine were also generated.  COMPARISON:  Head CT scan 01/30/2013.  FINDINGS: CT HEAD FINDINGS  Laceration above the right eye is seen. Cortical atrophy and chronic microvascular ischemic change are identified. There is no evidence of acute intracranial abnormality including infarct, hemorrhage, mass lesion, mass effect, midline shift or abnormal extra-axial fluid collection. No hydrocephalus or pneumocephalus. The calvarium is intact. Small mucous retention cysts or polyps are seen in the left maxillary sinus. Mild mucosal thickening right maxillary sinus is identified. No fracture is seen.  CT CERVICAL SPINE FINDINGS  No fracture or malalignment is identified. The patient has marked multilevel degenerative disease. Lung apices demonstrate extensive emphysema. Bilateral upper lobe airspace disease is present, worse on the left.  IMPRESSION: Laceration above the right eye without underlying fracture or acute  intracranial abnormality.  No acute abnormality cervical spine.  Emphysema with extensive bilateral upper lobe airspace disease most compatible with pneumonia.  Atrophy and microvascular ischemic change.  Multilevel spondylosis.   Electronically Signed   By: Inge Rise M.D.   On: 04/20/2013 13:31   Dg Chest Port 1 View  04/20/2013   CLINICAL DATA:  Status post fall  EXAM: PORTABLE CHEST - 1 VIEW  COMPARISON:  DG THORACIC SPINE dated 04/20/2013; DG RIBS BILATERAL W/CHEST dated 01/30/2013; DG CHEST 2 VIEW dated 01/29/2013  FINDINGS: The lungs are well-expanded. The interstitial markings are increased bilaterally as compared to the previous studies. The cardiopericardial silhouette remains enlarged. The pulmonary vascularity is indistinct. There is no pleural effusion. The observed portions of the bony thorax exhibit no acute abnormalities.  IMPRESSION: Increased interstitial markings bilaterally are worrisome for pulmonary interstitial edema likely secondary to cardiac etiologies. No alveolar pneumonia is demonstrated.   Electronically Signed   By: David  [place]   On: 04/20/2013 15:18   Dg Hand Complete Right  04/20/2013   CLINICAL DATA:  [place]. Lacerations of the right hand and wrist. Wrist pain.  EXAM: RIGHT HAND - COMPLETE 3+ VIEW  COMPARISON:  None.  FINDINGS: There is a large soft tissue mass along the radial aspect of the distal forearm. No radiopaque foreign body identified. Surgical clips are identified in the wrist. There is no evidence for acute fracture or dislocation. Degenerative changes are present. These primarily involve the distal interphalangeal joints in the radiocarpal joint. There is significant atherosclerotic calcification of the small vessels.  IMPRESSION: 1.  No evidence for acute osseous abnormality. 2. Large soft tissue swelling/mass involving the distal aspect of the forearm. 3. Postoperative changes.   Electronically Signed   By: Shon Hale M.D.   On: 04/20/2013 13:49    EKG  Interpretation    Date/Time:  Monday April 20 2013 11:47:08 EST Ventricular Rate:  81 PR Interval:    QRS Duration: 131 QT Interval:  505 QTC Calculation: 586 R Axis:   68 Text Interpretation:  Atrial flutter Nonspecific intraventricular conduction delay Anteroseptal infarct, old Repol abnrm suggests ischemia, diffuse leads Minimal ST elevation, lateral leads Artifact in lead(s) I III aVL artifact, needs repeating Confirmed by Canary Brim  MD, MARTHA [phone]) on 04/20/2013 2:42:38 PM          Repeat EKG  Date: 04/20/2013  Rate: 81  Rhythm: atrial flutter  QRS Axis: right  Intervals: normal  ST/T Wave abnormalities: nonspecific T wave changes  Conduction Disutrbances:none  Narrative Interpretation: variable AV block, T wave abnormality to consider possible ischemia  Old EKG Reviewed: unchanged EKG analyzed and reviewed by this provider and attending physician.    MDM   Final diagnoses:  HCAP (healthcare-associated pneumonia)  Altered mental status  Fall   Medications  levofloxacin (LEVAQUIN) IVPB 750 mg (not administered)  sodium chloride 0.9 % bolus 500 mL (500 mLs Intravenous New Bag/Given 04/20/13 1343)   Filed Vitals:   04/20/13 1200 04/20/13 1215 04/20/13 1230 04/20/13 1341  BP: 126/69 125/63 128/67 133/68  Pulse: 82 78 78 77  Resp: 14 16 11 12   SpO2: 98% 99% 99% 97%    Patient presenting to the ED with a fall that occurred prior to arrival - reported that when he was on the toilet while getting up he lost his balance and fell - reported hitting his head on the tile floor - stated that he tried to catch himself with his right hand, but now the hand hurts. Reported neck pain. Patient has AV fistula placement in the right wrist region. Patient currently on Coumadin. Alert and oriented. GCS 15. Heart rate and rhythm normal. Lungs clear to auscultation. Superficial abrasions localized to right aspect of the forehead, just above right eyebrow, superficial abrasion localized  to the nasal bridge. Superficial abrasion localized to the dorsal aspect of the right hand, at base of right thumb - bleeding moderately controlled. Pain upon palpation to the right shoulder with no deformity noted. Pain upon palpation to right hand and right wrist with deformity noted to the right wrist-swelling with ecchymosis. Cap refill less than 3 seconds. Radial and DP pulses 2+-patient has below the knee of dictation the right lower extremity. Full range of motion to left lower extremity. Discomfort upon palpation to the midthoracic region-mid spinal. Strength intact upper extremities bilaterally with resistance applied to mildly decreased to the right secondary to pain in the right hand. Strength intact to left lower extremity with resistance applied. Cranial nerves grossly intact. Sensation intact. 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  EKG noted atrial flutter - nonspecific intraventricular block noted. Negative elevated troponin. CBC noted mild elevated white cell count 16.1 with elevated neutrophils of 13.6, 85 absolute. CMP noted elevated BUN and creatinine-BUN 53, creatinine 5.94-patient has end-stage renal disease on dialysis. Elevated INR and PT-PT 20.2, INR 2.76. Plain film of right hand negative for acute osseous injury-large soft tissue swelling involving the distal aspect of the forearm. Elevated APTT of 48. Plain film of right hand negative for acute osseous injury, large soft tissue swelling localized to the distal aspect of the right forearm. Plain film of right wrist negative for acute osseous injury-hematoma noted to the distal radial aspect. Right shoulder-negative acute abnormalities-subacute to remote third, fourth rib fractures. 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 Plain film of thoracic spine negative for acute abnormalities. CT head noted laceration above the right eye without  underlying fracture acute intercranial abnormality. CT cervical spine noted-negative acute abnormalities. Emphysema with extensive bilateral upper lobe airspace disease compatible with pneumonia. Discussed case with Dr. Lyman Speller - patient to be admitted for altered mental status, bilateral pneumonia, fracture ribs of 3 and 4, and fall. Patient placed on IV fluids controlled since ESRD patient and on dialysis. Patient started on antibiotics. Patient to be admitted to the hospital at Telemetry, observation. Patient stable for transfer.   Jamse Mead, PA-C 04/21/13 1104

## 2013-04-20 NOTE — ED Notes (Signed)
MD at bedside. 

## 2013-04-20 NOTE — Progress Notes (Signed)
Hypoglycemic Event  CBG: 68  Treatment: D50 IV 50 mL  Symptoms: None     Chris Vance O  Remember to initiate Hypoglycemia Order Set & complete

## 2013-04-20 NOTE — ED Notes (Addendum)
Patient arrived via [facility] from [place] where he is a resident at. Patient was sitting on the commode when he leaned to far forward and fell hitting his head. Patient is on Coumadin. He has a laceration to his right eyebrow and a laceration to the bridge of his nose and has complaints of head, right hand and right leg pain. Denies any LOC. Patient is a dialysis M, W, F. Patient and has a right BKA and uses a wheelchair to get around.

## 2013-04-20 NOTE — Progress Notes (Signed)
ANTICOAGULATION/ANTIBIOTIC CONSULT NOTE - Initial Consult  Pharmacy Consult for Coumadin and Levaquin Indication: atrial fibrillation; PNA   Allergies  Allergen Reactions  . Ace Inhibitors   . Lisinopril Cough    Patient Measurements:  Weight: 72kg  Vital Signs: Temp: 97.6 F (36.4 C) (02/23 1700) Temp src: Oral (02/23 1700) BP: 114/65 mmHg (02/23 1700) Pulse Rate: 84 (02/23 1700)  Labs:  Recent Labs  04/20/13 1205  HGB 10.8*  HCT 32.7*  PLT 345  APTT 48*  LABPROT 28.2*  INR 2.76*  CREATININE 5.94*  TROPONINI <0.30    The CrCl is unknown because both a height and weight (above a minimum accepted value) are required for this calculation.   Medical History: Past Medical History  Diagnosis Date  . Renal disorder   . Diabetes mellitus   . Hypertension   . Anemia   . Hyperlipidemia   . Thyroid disease   . Anxiety   . Skin cancer     Medications:  Prescriptions prior to admission  Medication Sig Dispense Refill  . acetaminophen (TYLENOL) 325 MG tablet Take 2 tablets (650 mg total) by mouth every 6 (six) hours as needed for mild pain or moderate pain (or Fever >/= 101).  120 tablet  0  . amiodarone (PACERONE) 200 MG tablet Take 1 tablet (200 mg total) by mouth 2 (two) times daily.  60 tablet  0  . calcium acetate, Phos Binder, (PHOSLYRA) 667 MG/5ML SOLN 3 tablets daily with meals      . camphor-menthol (SARNA) lotion Apply topically as needed for itching.  222 mL  0  . feeding supplement, RESOURCE BREEZE, (RESOURCE BREEZE) LIQD Take 1 Container by mouth 3 (three) times daily with meals.  90 Container  0  . gabapentin (NEURONTIN) 300 MG capsule Take 300 mg by mouth at bedtime.      Marland Kitchen HYDROcodone-acetaminophen (NORCO/VICODIN) 5-325 MG per tablet Take one tablet by mouth every 6 hours as needed for moderate or severe pain  120 tablet  0  . insulin glargine (LANTUS) 100 UNIT/ML injection Inject 40 Units into the skin at bedtime.       . lidocaine-prilocaine (EMLA)  cream Apply 1 application topically 3 (three) times a week. Dialysis med      . metoprolol (LOPRESSOR) 100 MG tablet Take 1 tablet (100 mg total) by mouth 2 (two) times daily.  60 tablet  0  . omeprazole (PRILOSEC) 20 MG capsule Take 20 mg by mouth daily.      Marland Kitchen warfarin (COUMADIN) 5 MG tablet Take 4 mg by mouth daily.         Assessment: 1. 72yom to continue Coumadin for Afib. Admit INR is therapeutic at 2.76. However, per NH, Coumadin has been on hold since 2/13 for elevated INR. Patient had previously been on 4mg  daily - will restart at lower dose and follow-up daily INR.  2. Patient will also receive Levaquin for PNA seen on CT. Patient is currently afebrile but WBC are 16. Patient has ESRD and receives HD on MWF.  Goal of Therapy:  INR 2-3   Plan:  1. Coumadin 1mg  po x 1 today 2. Levaquin 750mg  x 1 (IV ordered by ED MD), then 500mg  PO q48h - next dose on 2/25 3. Daily INR 4. Monitor cultures, clinical progress and adjust as indicated  Earleen Newport [phone] 04/20/2013,6:38 PM

## 2013-04-20 NOTE — ED Notes (Signed)
Patient transported to CT 

## 2013-04-20 NOTE — ED Notes (Signed)
Attempted to do In and Out cath but was unsuccessful. Pt stated after this EMT had attempted the In and Out that he quit producing urine "a long time ago" RN and PA made aware.

## 2013-04-20 NOTE — ED Notes (Signed)
Report called to [facility], RN unit 6E.

## 2013-04-20 NOTE — H&P (Signed)
[facility] History and Physical  Chris Vance IRS:[id number] DOB: [birth year] DOA: 04/20/2013  Referring physician: Jamse Mead PCP: Tamsen Roers, MD   Chief Complaint: [place]  HPI: Chris Vance is a 73 y.o. male  Past medical history of end-stage renal disease, diastolic heart failure and diabetes status post right BKA who is normally a wheelchair who fell out of his wheelchair today when he lost his balance hitting his head. Patient was brought into the emergency room. Lab work was initially normal except for white blood cell count 16.1. Patient was noted to have an incidental pneumonia by chest x-ray. His vital signs were otherwise stable and his INR was therapeutic. No evidence of fractures or bleeding. Patient plan was to send patient home, however the patient's brother who saw him in the emergency room raise concerns that the patient looked to be confused. Confusion was not noted in by GERD ending or the hospitalist called. Given acute pneumonia with white count and family report of confusion, felt best to watch patient overnight. Incidentally, patient was scheduled for dialysis Monday and Wednesday and Friday and missed because of being in the emergency room.   Review of Systems:  Patient seen down in emergency room. In okay. Denies any headaches, vision changes, dysphagia, chest pain, palpitations, shortness of breath, wheeze, cough, abdominal pain, hematuria, dysuria, constipation, diarrhea. No focal extremity numbness or weakness or pain. Really patient, has no complaints.  Past Medical History  Diagnosis Date  . Renal disorder   . Diabetes mellitus   . Hypertension   . Anemia   . Hyperlipidemia   . Thyroid disease   . Anxiety   . Skin cancer    Past Surgical History  Procedure Laterality Date  . Av fistula placement    . Amputation Right 02/13/2013    Procedure: Right Below Knee Amputation;  Surgeon: Newt Minion, MD;  Location: [facility];  Service:  Orthopedics;  Laterality: Right;  Right Below Knee Amputation   Social History:  reports that he has quit smoking. His smoking use included Cigarettes. He started smoking about 56 years ago. He has a 55 pack-year smoking history. He has never used smokeless tobacco. He reports that he does not drink alcohol or use illicit drugs.  Allergies  Allergen Reactions  . Ace Inhibitors   . Lisinopril Cough    Family History  Problem Relation Age of Onset  . Asthma Father      Prior to Admission medications   Medication Sig Start Date End Date Taking? Authorizing Provider  acetaminophen (TYLENOL) 325 MG tablet Take 2 tablets (650 mg total) by mouth every 6 (six) hours as needed for mild pain or moderate pain (or Fever >/= 101). 02/16/13   Marrion Coy, MD  amiodarone (PACERONE) 200 MG tablet Take 1 tablet (200 mg total) by mouth 2 (two) times daily. 02/16/13   Marrion Coy, MD  calcium acetate, Phos Binder, (PHOSLYRA) 667 MG/5ML SOLN 3 tablets daily with meals    Historical Provider, MD  camphor-menthol [facility]) lotion Apply topically as needed for itching. 02/16/13   Marrion Coy, MD  feeding supplement, RESOURCE BREEZE, (RESOURCE BREEZE) LIQD Take 1 Container by mouth 3 (three) times daily with meals. 02/16/13   Marrion Coy, MD  gabapentin (NEURONTIN) 300 MG capsule Take 300 mg by mouth at bedtime.    Historical Provider, MD  HYDROcodone-acetaminophen (NORCO/VICODIN) 5-325 MG per tablet Take one tablet by mouth every 6 hours as needed for moderate or severe pain 02/17/13  Estill Dooms, MD  insulin glargine (LANTUS) 100 UNIT/ML injection Inject 40 Units into the skin at bedtime.     Historical Provider, MD  lidocaine-prilocaine (EMLA) cream Apply 1 application topically 3 (three) times a week. Dialysis med    Historical Provider, MD  metoprolol (LOPRESSOR) 100 MG tablet Take 1 tablet (100 mg total) by mouth 2 (two) times daily. 02/16/13   Marrion Coy, MD    omeprazole (PRILOSEC) 20 MG capsule Take 20 mg by mouth daily.    Historical Provider, MD  warfarin (COUMADIN) 5 MG tablet Take 4 mg by mouth daily.     Historical Provider, MD   Physical Exam: Filed Vitals:   04/20/13 1700  BP: 114/65  Pulse: 84  Temp: 97.6 F (36.4 C)  Resp: 16    BP 114/65  Pulse 84  Temp(Src) 97.6 F (36.4 C) (Oral)  Resp 16  SpO2 95%  General:  Appears calm and comfortable, no acute distress HEENT: Normocephalic, bruising above the around right eye, mucous membranes are slightly dry Cardiovascular: Irregular rhythm, rate controlled, 2/6 systolic ejection murmur Lungs: Clear to auscultation bilaterally Abdomen: Soft, nontender, nondistended, positive bowel sounds Extremities: No clubbing or cyanosis, status post right BKA with erythema and swelling around stump. No evidence of acute active drainage Neuro: No focal deficits, speech is slightly slurred but this looks more because he is mildly dehydrated Psych: Patient is appropriate, no evidence of psychoses           Labs on Admission:  Basic Metabolic Panel:  Recent Labs Lab 04/20/13 1205  NA 137  K 4.7  CL 93*  CO2 25  GLUCOSE 85  BUN 53*  CREATININE 5.94*  CALCIUM 9.8   Liver Function Tests:  Recent Labs Lab 04/20/13 1205  AST 12  ALT 9  ALKPHOS 137*  BILITOT 0.7  PROT 6.9  ALBUMIN 2.4*   No results found for this basename: LIPASE, AMYLASE,  in the last 168 hours No results found for this basename: AMMONIA,  in the last 168 hours CBC:  Recent Labs Lab 04/20/13 1205  WBC 16.1*  NEUTROABS 13.6*  HGB 10.8*  HCT 32.7*  MCV 96.5  PLT 345   Cardiac Enzymes:  Recent Labs Lab 04/20/13 1205  TROPONINI <0.30    BNP (last 3 results) No results found for this basename: PROBNP,  in the last 8760 hours CBG:  Recent Labs Lab 04/20/13 1128  GLUCAP 94    Radiological Exams on Admission: Dg Thoracic Spine 2 View  04/20/2013   CLINICAL DATA:  Fall.  EXAM: THORACIC SPINE  - 2 VIEW  COMPARISON:  PA and lateral chest 01/29/2013.  FINDINGS: No fracture is identified. Marked multilevel degenerative change is seen. Aortic atherosclerosis is noted.  IMPRESSION: No acute finding.   Electronically Signed   By: Inge Rise M.D.   On: 04/20/2013 13:46   Dg Shoulder Right  04/20/2013   CLINICAL DATA:  Fall.  EXAM: RIGHT SHOULDER - 2+ VIEW  COMPARISON:  None.  FINDINGS: The humerus is located and the acromioclavicular joint is intact. The humerus is high-riding consistent with chronic rotator cuff tear. Acromioclavicular degenerative change is seen. There are subacute to remote fractures of the right third and fourth ribs.  IMPRESSION: No acute finding.  Subacute to remote right third and fourth rib fractures.  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   Electronically Signed   By: Inge Rise M.D.   On: 04/20/2013 13:48  Dg Wrist Complete Right  04/20/2013   CLINICAL DATA:  [place].  Wrist and hand pain.  Lacerations.  EXAM: RIGHT WRIST - COMPLETE 3+ VIEW  COMPARISON:  Views of the hand on the same day  FINDINGS: There is normal alignment of the carpals. No evidence for acute fracture or dislocation.  There are surgical clips in the region of the wrist. A soft tissue mass projects over the soft tissues adjacent to the distal radius, measuring 4.9 x 3.5 cm. Scar represent hematoma or other soft tissue mass. There is dense atherosclerotic calcification of the small vessels in the region of the wrist and hand.  IMPRESSION: 1. No evidence for acute fracture. 2. Soft tissue mass or hematoma adjacent to the distal radius. 3. Postoperative changes.   Electronically Signed   By: Shon Hale M.D.   On: 04/20/2013 13:51   Ct Head Wo Contrast  04/20/2013   CLINICAL DATA:  Status post fall with a blow to the head. The patient is anticoagulated. Laceration above right eyebrow.  EXAM: CT HEAD WITHOUT CONTRAST  CT CERVICAL SPINE WITHOUT  CONTRAST  TECHNIQUE: Multidetector CT imaging of the head and cervical spine was performed following the standard protocol without intravenous contrast. Multiplanar CT image reconstructions of the cervical spine were also generated.  COMPARISON:  Head CT scan 01/30/2013.  FINDINGS: CT HEAD FINDINGS  Laceration above the right eye is seen. Cortical atrophy and chronic microvascular ischemic change are identified. There is no evidence of acute intracranial abnormality including infarct, hemorrhage, mass lesion, mass effect, midline shift or abnormal extra-axial fluid collection. No hydrocephalus or pneumocephalus. The calvarium is intact. Small mucous retention cysts or polyps are seen in the left maxillary sinus. Mild mucosal thickening right maxillary sinus is identified. No fracture is seen.  CT CERVICAL SPINE FINDINGS  No fracture or malalignment is identified. The patient has marked multilevel degenerative disease. Lung apices demonstrate extensive emphysema. Bilateral upper lobe airspace disease is present, worse on the left.  IMPRESSION: Laceration above the right eye without underlying fracture or acute intracranial abnormality.  No acute abnormality cervical spine.  Emphysema with extensive bilateral upper lobe airspace disease most compatible with pneumonia.  Atrophy and microvascular ischemic change.  Multilevel spondylosis.   Electronically Signed   By: Inge Rise M.D.   On: 04/20/2013 13:31   Ct Cervical Spine Wo Contrast  04/20/2013   CLINICAL DATA:  Status post fall with a blow to the head. The patient is anticoagulated. Laceration above right eyebrow.  EXAM: CT HEAD WITHOUT CONTRAST  CT CERVICAL SPINE WITHOUT CONTRAST  TECHNIQUE: Multidetector CT imaging of the head and cervical spine was performed following the standard protocol without intravenous contrast. Multiplanar CT image reconstructions of the cervical spine were also generated.  COMPARISON:  Head CT scan 01/30/2013.  FINDINGS: CT  HEAD FINDINGS  Laceration above the right eye is seen. Cortical atrophy and chronic microvascular ischemic change are identified. There is no evidence of acute intracranial abnormality including infarct, hemorrhage, mass lesion, mass effect, midline shift or abnormal extra-axial fluid collection. No hydrocephalus or pneumocephalus. The calvarium is intact. Small mucous retention cysts or polyps are seen in the left maxillary sinus. Mild mucosal thickening right maxillary sinus is identified. No fracture is seen.  CT CERVICAL SPINE FINDINGS  No fracture or malalignment is identified. The patient has marked multilevel degenerative disease. Lung apices demonstrate extensive emphysema. Bilateral upper lobe airspace disease is present, worse on the left.  IMPRESSION: Laceration above the right eye without  underlying fracture or acute intracranial abnormality.  No acute abnormality cervical spine.  Emphysema with extensive bilateral upper lobe airspace disease most compatible with pneumonia.  Atrophy and microvascular ischemic change.  Multilevel spondylosis.   Electronically Signed   By: Inge Rise M.D.   On: 04/20/2013 13:31   Dg Chest Port 1 View  04/20/2013   CLINICAL DATA:  Status post fall  EXAM: PORTABLE CHEST - 1 VIEW  COMPARISON:  DG THORACIC SPINE dated 04/20/2013; DG RIBS BILATERAL W/CHEST dated 01/30/2013; DG CHEST 2 VIEW dated 01/29/2013  FINDINGS: The lungs are well-expanded. The interstitial markings are increased bilaterally as compared to the previous studies. The cardiopericardial silhouette remains enlarged. The pulmonary vascularity is indistinct. There is no pleural effusion. The observed portions of the bony thorax exhibit no acute abnormalities.  IMPRESSION: Increased interstitial markings bilaterally are worrisome for pulmonary interstitial edema likely secondary to cardiac etiologies. No alveolar pneumonia is demonstrated.   Electronically Signed   By: David  Martinique   On: 04/20/2013 15:18    Dg Hand Complete Right  04/20/2013   CLINICAL DATA:  [place]. Lacerations of the right hand and wrist. Wrist pain.  EXAM: RIGHT HAND - COMPLETE 3+ VIEW  COMPARISON:  None.  FINDINGS: There is a large soft tissue mass along the radial aspect of the distal forearm. No radiopaque foreign body identified. Surgical clips are identified in the wrist. There is no evidence for acute fracture or dislocation. Degenerative changes are present. These primarily involve the distal interphalangeal joints in the radiocarpal joint. There is significant atherosclerotic calcification of the small vessels.  IMPRESSION: 1.  No evidence for acute osseous abnormality. 2. Large soft tissue swelling/mass involving the distal aspect of the forearm. 3. Postoperative changes.   Electronically Signed   By: Shon Hale M.D.   On: 04/20/2013 13:49    EKG: Independently reviewed. A-flutter  Assessment/Plan Active Problems:   Hyperlipidemia:    End stage renal disease: Nephrology consulted, pt was supposed to have dialysis today. Spoke with nephrology, they recommended given patient's stability-potassium only 4.7, that patient be discharged early and can go to the dialysis center immediately after discharge and then resume normal schedule on Wednesday. If there is a delay, patient can be dialyzed tomorrow.    Multiple rib fractures: Stable, breathing stable.  Trauma service.    Hx of right BKA: As per primary care, not responding to local wound therapy, wound care consulted to see for non-healing stump    Type II or unspecified type diabetes mellitus with peripheral circulatory disorders, uncontrolled(250.72): Sliding scale plus reduced dose Lantus.    Atrial fibrillation: rt controlled, INR therapeutic. Continue Coumadin    HCAP (healthcare-associated pneumonia): po Levaquin per pharmacy.  Treat as healthcare associated given nursing home status    Diastolic CHF, chronic: Looks to be stable.       Code Status: Full  code  Family Communication: Left  Disposition Plan: Back to SNF tomorrow  Time spent: 61 minutes  [facility] Pager [phone]

## 2013-04-21 DIAGNOSIS — E1159 Type 2 diabetes mellitus with other circulatory complications: Secondary | ICD-10-CM

## 2013-04-21 DIAGNOSIS — I4891 Unspecified atrial fibrillation: Secondary | ICD-10-CM

## 2013-04-21 LAB — BASIC METABOLIC PANEL
BUN: 63 mg/dL — AB (ref 6–23)
CHLORIDE: 93 meq/L — AB (ref 96–112)
CO2: 27 mEq/L (ref 19–32)
Calcium: 9.8 mg/dL (ref 8.4–10.5)
Creatinine, Ser: 6.68 mg/dL — ABNORMAL HIGH (ref 0.50–1.35)
GFR calc Af Amer: 9 mL/min — ABNORMAL LOW (ref >90)
GFR calc non Af Amer: 7 mL/min — ABNORMAL LOW (ref >90)
Glucose, Bld: 61 mg/dL — ABNORMAL LOW (ref 70–99)
POTASSIUM: 4.6 meq/L (ref 3.7–5.3)
Sodium: 135 mEq/L — ABNORMAL LOW (ref 137–147)

## 2013-04-21 LAB — GLUCOSE, CAPILLARY
GLUCOSE-CAPILLARY: 83 mg/dL (ref 70–99)
Glucose-Capillary: 69 mg/dL — ABNORMAL LOW (ref 70–99)
Glucose-Capillary: 97 mg/dL (ref 70–99)
Glucose-Capillary: 119 mg/dL — ABNORMAL HIGH (ref 70–99)

## 2013-04-21 LAB — CBC
HEMATOCRIT: 29.5 % — AB (ref 39.0–52.0)
Hemoglobin: 9.9 g/dL — ABNORMAL LOW (ref 13.0–17.0)
MCH: 31.9 pg (ref 26.0–34.0)
MCHC: 33.6 g/dL (ref 30.0–36.0)
MCV: 95.2 fL (ref 78.0–100.0)
Platelets: 323 10*3/uL (ref 150–400)
RBC: 3.1 MIL/uL — ABNORMAL LOW (ref 4.22–5.81)
RDW: 16.9 % — ABNORMAL HIGH (ref 11.5–15.5)
WBC: 13.4 10*3/uL — AB (ref 4.0–10.5)

## 2013-04-21 LAB — HIV ANTIBODY (ROUTINE TESTING W REFLEX): HIV: NONREACTIVE

## 2013-04-21 LAB — PROTIME-INR
INR: 2.55 — ABNORMAL HIGH (ref 0.00–1.49)
PROTHROMBIN TIME: 26.6 s — AB (ref 11.6–15.2)

## 2013-04-21 MED ORDER — NEPRO/CARBSTEADY PO LIQD
237.0000 mL | ORAL | Status: DC | PRN
Start: 2013-04-21 — End: 2013-04-21
  Filled 2013-04-21: qty 237

## 2013-04-21 MED ORDER — LIDOCAINE-PRILOCAINE 2.5-2.5 % EX CREA
1.0000 | TOPICAL_CREAM | CUTANEOUS | Status: DC | PRN
Start: 2013-04-21 — End: 2013-04-21
  Filled 2013-04-21: qty 5

## 2013-04-21 MED ORDER — HEPARIN SODIUM (PORCINE) 1000 UNIT/ML DIALYSIS
1000.0000 [IU] | INTRAMUSCULAR | Status: DC | PRN
Start: 2013-04-21 — End: 2013-04-21

## 2013-04-21 MED ORDER — DARBEPOETIN ALFA-POLYSORBATE 25 MCG/0.42ML IJ SOLN
INTRAMUSCULAR | Status: AC
  Filled 2013-04-21: qty 0.42

## 2013-04-21 MED ORDER — SODIUM CHLORIDE 0.9 % IV SOLN: 100.0000 mL | INTRAVENOUS | Status: DC | PRN

## 2013-04-21 MED ORDER — LIDOCAINE HCL (PF) 1 % IJ SOLN: 5.0000 mL | INTRAMUSCULAR | Status: DC | PRN

## 2013-04-21 MED ORDER — ALTEPLASE 2 MG IJ SOLR
2.0000 mg | Freq: Once | INTRAMUSCULAR | Status: DC | PRN
  Filled 2013-04-21: qty 2

## 2013-04-21 MED ORDER — WARFARIN SODIUM 1 MG PO TABS
1.0000 mg | ORAL_TABLET | Freq: Once | ORAL | Status: AC
  Administered 2013-04-21: 1 mg via ORAL
  Filled 2013-04-21: qty 1

## 2013-04-21 MED ORDER — LEVOFLOXACIN 500 MG PO TABS: 500.0000 mg | ORAL_TABLET | ORAL | Status: DC

## 2013-04-21 MED ORDER — PENTAFLUOROPROP-TETRAFLUOROETH EX AERO: 1.0000 "application " | INHALATION_SPRAY | CUTANEOUS | Status: DC | PRN

## 2013-04-21 MED ORDER — DARBEPOETIN ALFA-POLYSORBATE 25 MCG/0.42ML IJ SOLN
25.0000 ug | INTRAMUSCULAR | Status: DC
  Administered 2013-04-21: 25 ug via INTRAVENOUS

## 2013-04-21 NOTE — ED Provider Notes (Signed)
Medical screening examination/treatment/procedure(s) were performed by non-physician practitioner and as supervising physician I was immediately available for consultation/collaboration.     Threasa Beards, MD 04/21/13 [phone]

## 2013-04-21 NOTE — Progress Notes (Signed)
CBG: 69  Treatment: 15 GM carbohydrate snack/Breakfast  Symptoms: None  Follow-up CBG: Time:09:07 CBG Result:97  Possible Reasons for Event: Inadequate meal intake  Comments/MD notified:Dr. Tat notified. No new orders given at this time. Will continue to encourage patient's PO food intake.    Chris Vance

## 2013-04-21 NOTE — Discharge Summary (Signed)
Physician Discharge Summary  Chris Vance NTZ:[id number] DOB: 04-Dec-1940 DOA: 04/20/2013  PCP: Tamsen Roers, MD  Admit date: 04/20/2013 Discharge date: 04/21/2013  Recommendations for Outpatient Follow-up:  1. Pt will need to follow up with PCP in 2 weeks post discharge 2. Please obtain BMP to evaluate electrolytes and kidney function 3. Please also check CBC to evaluate Hg and Hct levels 4. Please check INR on 04/23/13 and adjust warfarin accordingly   Discharge Diagnoses:  Active Problems:   Hyperlipidemia   End stage renal disease   Multiple rib fractures   Hx of right BKA   Type II or unspecified type diabetes mellitus with peripheral circulatory disorders, uncontrolled(250.72)   Atrial fibrillation   HCAP (healthcare-associated pneumonia)   Diastolic CHF, chronic Pulmonary infitrate/Pneumonia -WBC improved with levofloxacin -finish one week of levofloxacin 500mg  every 48 hrs x 3 doses startin 04/22/13 -stable clinically on room air -afebrile and hemodynamically stable Mechanical Fall -CT brain negative for any acute intracranial abnormalities -CT cervical spine negative for any fractures or dislocation -X-ray right shoulder no acute findings but did show remote to subacute right third and fourth rib fractures -Chest x-ray showed bilateral interstitial infiltrates Rib fractures -Patient has a history of falls and fell approximately 6 months ago -X-ray findings suggest remote to subacute right third and fourth rib fractures ESRD -Patient missed dialysis due to coming to the ED -Spoke with nephrology -The patient will be dialyzed prior to sending the patient back to his skilled nursing facility Right hand/wrist swelling noted on x-ray -This is due to the patient's right upper extremity fistula Chronic atrial fibrillation -Rate controlled -Continue amiodarone -continue warfarin although continuation may need to be re-evaluated if pt continues to have mechanical  falls -Please check INR on 01/21/2014 and adjust warfarin dose accordingly. His INR will need to be monitored closely as the patient is on levofloxacin for the next week. Diabetes mellitus type 2 -NovoLog sliding scale the patient is in hospital -The patient had episodes of mild hypoglycemia--stopped Lantus -His long-acting insulin was discontinued -Will not restart Lantus at this time -CBG check should be continued at his nursing facility to determine if the patient is to be restarted on his long-acting insulin in the future. -Hemoglobin A1c 6.5 on 01/31/2013 -Discontinue to perform Accu-Cheks 4 times daily to determine if the patient needs to be restarted on insulin in the future. Chronic diastolic CHF -EF 67-59%, grade 1 diastolic dysfunction -Continue dialysis for fluid management -Appears compensated at this time -Continue metoprolol Discharge Condition:  Stable Disposition:  skilled nursing facility   Diet:renal Wt Readings from Last 3 Encounters:  04/21/13 75.6 kg (166 lb 10.7 oz)  02/17/13 71.1 kg (156 lb 12 oz)  02/17/13 71.1 kg (156 lb 12 oz)    History of present illness:  73 y.o M with PMHx of DM, HTN, HLD, anemia, thyroid disease, anxiety, ESRD on dialysis (due to get dialysis today) presenting to the ED with a mechanical fall at his nursing facility. The patient was trying to get up off the commode when he lost his balance and hit the floor. He landed on his head and right shoulder. Radiographs were obtained and were negative for any acute abnormalities in the brain or cervical spine. Radiographs to review a remote to subacute right third and fourth rib fractures. The patient was essentially asymptomatic. There was no respiratory distress. Radiographs suggested bilateral upper lobe airspace disease. With the patient's WBC is 16.1, the patient was started on levofloxacin. His WBC count improved  on antibiotics. He remained afebrile and hemodynamically stable. The patient was  noted to have episodes of mild hypoglycemia with CBG in the upper 60s. His Lantus was discontinued. His Lantus will continue to be held, and the patient wanted to be reevaluated at his skilled nursing facility for further adjustment of his diabetic regimen. His hemoglobin A1c was 6.5 on 01/31/2013. The patient was continued on his warfarin for his initial fibrillation. However this may need to be reevaluated in the future if the patient continues to have mechanical falls.     Consultants: nephrology  Discharge Exam: Filed Vitals:   04/21/13 1153  BP: 109/69  Pulse: 85  Temp: 97.5 F (36.4 C)  Resp: 17   Filed Vitals:   04/21/13 0510 04/21/13 0600 04/21/13 0820 04/21/13 1153  BP: 121/68  125/76 109/69  Pulse: 84  90 85  Temp: 97.4 F (36.3 C)  97.7 F (36.5 C) 97.5 F (36.4 C)  TempSrc: Oral  Oral Oral  Resp: 15  16 17   Height:  5\' 10"  (1.778 m)    Weight:  75.6 kg (166 lb 10.7 oz)    SpO2: 93%  95% 93%   General: awake and alert, NAD, pleasant, cooperative Cardiovascular: IRRR, no rub, no gallop,  Respiratory: bibasilar crackles. No wheezing good air movement.  Abdomen:soft, nontender, nondistended, positive bowel sounds Extremities: No edema, No lymphangitis, no petechiae; right BKA without any erythema, drainage, induration.   Discharge Instructions       Future Appointments Provider Department Dept Phone   05/05/2013 9:30 AM [facility]       Medication List    STOP taking these medications       insulin glargine 100 UNIT/ML injection  Commonly known as:  LANTUS      TAKE these medications       acetaminophen 325 MG tablet  Commonly known as:  TYLENOL  Take 650 mg by mouth every 6 (six) hours as needed for mild pain or moderate pain (or Fever >/= 101).     amiodarone 200 MG tablet  Commonly known as:  PACERONE  Take 200 mg by mouth 2 (two) times daily.     calcium acetate 667 MG  capsule  Commonly known as:  PHOSLO  Take 2,001 mg by mouth 3 (three) times daily with meals.     escitalopram 10 MG tablet  Commonly known as:  LEXAPRO  Take 10 mg by mouth daily.     feeding supplement (PRO-STAT SUGAR FREE 64) Liqd  Take 30 mLs by mouth 2 (two) times daily.     gabapentin 300 MG capsule  Commonly known as:  NEURONTIN  Take 300 mg by mouth at bedtime.     HYDROcodone-acetaminophen 5-325 MG per tablet  Commonly known as:  NORCO/VICODIN  Take 1 tablet by mouth every 6 (six) hours as needed for moderate pain.     levofloxacin 500 MG tablet  Commonly known as:  LEVAQUIN  Take 1 tablet (500 mg total) by mouth every other day. Start 04/22/13  Start taking on:  04/22/2013     lidocaine-prilocaine cream  Commonly known as:  EMLA  Apply 1 application topically 3 (three) times a week. Dialysis med     metoprolol 100 MG tablet  Commonly known as:  LOPRESSOR  Take 100 mg by mouth 2 (two) times daily.     omeprazole 20 MG capsule  Commonly known as:  PRILOSEC  Take 20  mg by mouth daily.     warfarin 1 MG tablet  Commonly known as:  COUMADIN  Take 1 mg by mouth daily.         The results of significant diagnostics from this hospitalization (including imaging, microbiology, ancillary and laboratory) are listed below for reference.    Significant Diagnostic Studies: Dg Thoracic Spine 2 View  04/20/2013   CLINICAL DATA:  Fall.  EXAM: THORACIC SPINE - 2 VIEW  COMPARISON:  PA and lateral chest 01/29/2013.  FINDINGS: No fracture is identified. Marked multilevel degenerative change is seen. Aortic atherosclerosis is noted.  IMPRESSION: No acute finding.   Electronically Signed   By: Inge Rise M.D.   On: 04/20/2013 13:46   Dg Shoulder Right  04/20/2013   CLINICAL DATA:  Fall.  EXAM: RIGHT SHOULDER - 2+ VIEW  COMPARISON:  None.  FINDINGS: The humerus is located and the acromioclavicular joint is intact. The humerus is high-riding consistent with chronic rotator cuff  tear. Acromioclavicular degenerative change is seen. There are subacute to remote fractures of the right third and fourth ribs.  IMPRESSION: No acute finding.  Subacute to remote right third and fourth rib fractures.  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   Electronically Signed   By: Inge Rise M.D.   On: 04/20/2013 13:48   Dg Wrist Complete Right  04/20/2013   CLINICAL DATA:  [place].  Wrist and hand pain.  Lacerations.  EXAM: RIGHT WRIST - COMPLETE 3+ VIEW  COMPARISON:  Views of the hand on the same day  FINDINGS: There is normal alignment of the carpals. No evidence for acute fracture or dislocation.  There are surgical clips in the region of the wrist. A soft tissue mass projects over the soft tissues adjacent to the distal radius, measuring 4.9 x 3.5 cm. Scar represent hematoma or other soft tissue mass. There is dense atherosclerotic calcification of the small vessels in the region of the wrist and hand.  IMPRESSION: 1. No evidence for acute fracture. 2. Soft tissue mass or hematoma adjacent to the distal radius. 3. Postoperative changes.   Electronically Signed   By: Shon Hale M.D.   On: 04/20/2013 13:51   Ct Head Wo Contrast  04/20/2013   CLINICAL DATA:  Status post fall with a blow to the head. The patient is anticoagulated. Laceration above right eyebrow.  EXAM: CT HEAD WITHOUT CONTRAST  CT CERVICAL SPINE WITHOUT CONTRAST  TECHNIQUE: Multidetector CT imaging of the head and cervical spine was performed following the standard protocol without intravenous contrast. Multiplanar CT image reconstructions of the cervical spine were also generated.  COMPARISON:  Head CT scan 01/30/2013.  FINDINGS: CT HEAD FINDINGS  Laceration above the right eye is seen. Cortical atrophy and chronic microvascular ischemic change are identified. There is no evidence of acute intracranial abnormality including infarct, hemorrhage, mass lesion, mass effect, midline  shift or abnormal extra-axial fluid collection. No hydrocephalus or pneumocephalus. The calvarium is intact. Small mucous retention cysts or polyps are seen in the left maxillary sinus. Mild mucosal thickening right maxillary sinus is identified. No fracture is seen.  CT CERVICAL SPINE FINDINGS  No fracture or malalignment is identified. The patient has marked multilevel degenerative disease. Lung apices demonstrate extensive emphysema. Bilateral upper lobe airspace disease is present, worse on the left.  IMPRESSION: Laceration above the right eye without underlying fracture or acute intracranial abnormality.  No acute abnormality cervical spine.  Emphysema with extensive bilateral upper lobe  airspace disease most compatible with pneumonia.  Atrophy and microvascular ischemic change.  Multilevel spondylosis.   Electronically Signed   By: Inge Rise M.D.   On: 04/20/2013 13:31   Ct Cervical Spine Wo Contrast  04/20/2013   CLINICAL DATA:  Status post fall with a blow to the head. The patient is anticoagulated. Laceration above right eyebrow.  EXAM: CT HEAD WITHOUT CONTRAST  CT CERVICAL SPINE WITHOUT CONTRAST  TECHNIQUE: Multidetector CT imaging of the head and cervical spine was performed following the standard protocol without intravenous contrast. Multiplanar CT image reconstructions of the cervical spine were also generated.  COMPARISON:  Head CT scan 01/30/2013.  FINDINGS: CT HEAD FINDINGS  Laceration above the right eye is seen. Cortical atrophy and chronic microvascular ischemic change are identified. There is no evidence of acute intracranial abnormality including infarct, hemorrhage, mass lesion, mass effect, midline shift or abnormal extra-axial fluid collection. No hydrocephalus or pneumocephalus. The calvarium is intact. Small mucous retention cysts or polyps are seen in the left maxillary sinus. Mild mucosal thickening right maxillary sinus is identified. No fracture is seen.  CT CERVICAL SPINE  FINDINGS  No fracture or malalignment is identified. The patient has marked multilevel degenerative disease. Lung apices demonstrate extensive emphysema. Bilateral upper lobe airspace disease is present, worse on the left.  IMPRESSION: Laceration above the right eye without underlying fracture or acute intracranial abnormality.  No acute abnormality cervical spine.  Emphysema with extensive bilateral upper lobe airspace disease most compatible with pneumonia.  Atrophy and microvascular ischemic change.  Multilevel spondylosis.   Electronically Signed   By: Inge Rise M.D.   On: 04/20/2013 13:31   Dg Chest Port 1 View  04/20/2013   CLINICAL DATA:  Status post fall  EXAM: PORTABLE CHEST - 1 VIEW  COMPARISON:  DG THORACIC SPINE dated 04/20/2013; DG RIBS BILATERAL W/CHEST dated 01/30/2013; DG CHEST 2 VIEW dated 01/29/2013  FINDINGS: The lungs are well-expanded. The interstitial markings are increased bilaterally as compared to the previous studies. The cardiopericardial silhouette remains enlarged. The pulmonary vascularity is indistinct. There is no pleural effusion. The observed portions of the bony thorax exhibit no acute abnormalities.  IMPRESSION: Increased interstitial markings bilaterally are worrisome for pulmonary interstitial edema likely secondary to cardiac etiologies. No alveolar pneumonia is demonstrated.   Electronically Signed   By: Tyquarius Paglia  [place]   On: 04/20/2013 15:18   Dg Hand Complete Right  04/20/2013   CLINICAL DATA:  [place]. Lacerations of the right hand and wrist. Wrist pain.  EXAM: RIGHT HAND - COMPLETE 3+ VIEW  COMPARISON:  None.  FINDINGS: There is a large soft tissue mass along the radial aspect of the distal forearm. No radiopaque foreign body identified. Surgical clips are identified in the wrist. There is no evidence for acute fracture or dislocation. Degenerative changes are present. These primarily involve the distal interphalangeal joints in the radiocarpal joint. There is  significant atherosclerotic calcification of the small vessels.  IMPRESSION: 1.  No evidence for acute osseous abnormality. 2. Large soft tissue swelling/mass involving the distal aspect of the forearm. 3. Postoperative changes.   Electronically Signed   By: Shon Hale M.D.   On: 04/20/2013 13:49     Microbiology: Recent Results (from the past 240 hour(s))  MRSA PCR SCREENING     Status: None   Collection Time    04/20/13  8:15 PM      Result Value Ref Range Status   MRSA by PCR NEGATIVE  NEGATIVE Final  Comment:            The GeneXpert MRSA Assay (FDA     approved for NASAL specimens     only), is one component of a     comprehensive MRSA colonization     surveillance program. It is not     intended to diagnose MRSA     infection nor to guide or     monitor treatment for     MRSA infections.     Labs: Basic Metabolic Panel:  Recent Labs Lab 04/20/13 1205 04/21/13 0533  NA 137 135*  K 4.7 4.6  CL 93* 93*  CO2 25 27  GLUCOSE 85 61*  BUN 53* 63*  CREATININE 5.94* 6.68*  CALCIUM 9.8 9.8   Liver Function Tests:  Recent Labs Lab 04/20/13 1205  AST 12  ALT 9  ALKPHOS 137*  BILITOT 0.7  PROT 6.9  ALBUMIN 2.4*   No results found for this basename: LIPASE, AMYLASE,  in the last 168 hours No results found for this basename: AMMONIA,  in the last 168 hours CBC:  Recent Labs Lab 04/20/13 1205 04/21/13 0533  WBC 16.1* 13.4*  NEUTROABS 13.6*  --   HGB 10.8* 9.9*  HCT 32.7* 29.5*  MCV 96.5 95.2  PLT 345 323   Cardiac Enzymes:  Recent Labs Lab 04/20/13 1205  TROPONINI <0.30   BNP: No components found with this basename: POCBNP,  CBG:  Recent Labs Lab 04/20/13 1801 04/20/13 1920 04/20/13 2200 04/21/13 0818 04/21/13 0901  GLUCAP 68* 126* 103* 69* 97    Time coordinating discharge:  Greater than 30 minutes  Signed:  Dymphna Wadley, DO [facility] Pager: [phone] 04/21/2013, 12:11 PM

## 2013-04-21 NOTE — Progress Notes (Signed)
ANTICOAGULATION CONSULT NOTE - [place] for Coumadin Indication: atrial fibrillation   Allergies  Allergen Reactions  . Ace Inhibitors   . Lisinopril Cough    Patient Measurements: Height: 5\' 10"  (177.8 cm) Weight: 162 lb 4.1 oz (73.6 kg) IBW/kg (Calculated) : 73  Vital Signs: Temp: 97.3 F (36.3 C) (02/24 1350) Temp src: Axillary (02/24 1350) BP: 119/57 mmHg (02/24 1500) Pulse Rate: 83 (02/24 1500)  Labs:  Recent Labs  04/20/13 1205 04/21/13 0533  HGB 10.8* 9.9*  HCT 32.7* 29.5*  PLT 345 323  APTT 48*  --   LABPROT 28.2* 26.6*  INR 2.76* 2.55*  CREATININE 5.94* 6.68*  TROPONINI <0.30  --     Estimated Creatinine Clearance: 10.5 ml/min (by C-G formula based on Cr of 6.68).   Medical History: Past Medical History  Diagnosis Date  . Renal disorder   . Diabetes mellitus   . Hypertension   . Anemia   . Hyperlipidemia   . Thyroid disease   . Anxiety   . Skin cancer     Medications:  Prescriptions prior to admission  Medication Sig Dispense Refill  . acetaminophen (TYLENOL) 325 MG tablet Take 650 mg by mouth every 6 (six) hours as needed for mild pain or moderate pain (or Fever >/= 101).      . Amino Acids-Protein Hydrolys (FEEDING SUPPLEMENT, PRO-STAT SUGAR FREE 64,) LIQD Take 30 mLs by mouth 2 (two) times daily.      Marland Kitchen amiodarone (PACERONE) 200 MG tablet Take 200 mg by mouth 2 (two) times daily.      . calcium acetate (PHOSLO) 667 MG capsule Take 2,001 mg by mouth 3 (three) times daily with meals.      Marland Kitchen escitalopram (LEXAPRO) 10 MG tablet Take 10 mg by mouth daily.      Marland Kitchen gabapentin (NEURONTIN) 300 MG capsule Take 300 mg by mouth at bedtime.      Marland Kitchen HYDROcodone-acetaminophen (NORCO/VICODIN) 5-325 MG per tablet Take 1 tablet by mouth every 6 (six) hours as needed for moderate pain.      Marland Kitchen lidocaine-prilocaine (EMLA) cream Apply 1 application topically 3 (three) times a week. Dialysis med      . metoprolol (LOPRESSOR) 100 MG  tablet Take 100 mg by mouth 2 (two) times daily.      Marland Kitchen omeprazole (PRILOSEC) 20 MG capsule Take 20 mg by mouth daily.      Marland Kitchen warfarin (COUMADIN) 1 MG tablet Take 1 mg by mouth daily.      . [DISCONTINUED] acetaminophen (TYLENOL) 325 MG tablet Take 2 tablets (650 mg total) by mouth every 6 (six) hours as needed for mild pain or moderate pain (or Fever >/= 101).  120 tablet  0  . [DISCONTINUED] amiodarone (PACERONE) 200 MG tablet Take 1 tablet (200 mg total) by mouth 2 (two) times daily.  60 tablet  0  . [DISCONTINUED] HYDROcodone-acetaminophen (NORCO/VICODIN) 5-325 MG per tablet Take one tablet by mouth every 6 hours as needed for moderate or severe pain  120 tablet  0  . [DISCONTINUED] insulin glargine (LANTUS) 100 UNIT/ML injection Inject 30 Units into the skin at bedtime.       . [DISCONTINUED] metoprolol (LOPRESSOR) 100 MG tablet Take 1 tablet (100 mg total) by mouth 2 (two) times daily.  60 tablet  0    Assessment: 73 yo M to presented 2/24 after fall.  Pt is to continue Coumadin for Afib. INR is therapeutic at 2.55. Will continue with previous  NH plans of Coumadin 1mg  daily.  Next INR check 2/26.    Goal of Therapy:  INR 2-3   Plan:  1. Coumadin 1mg  po x 1 today 2. Anticipate discharge back to NH with next INR check 2/26.   [employer], Pharm.D., BCPS Clinical Pharmacist Pager [phone] 04/21/2013 3:16 PM

## 2013-04-21 NOTE — Procedures (Signed)
I was present at this dialysis session, have reviewed the session itself and made  appropriate changes  Kelly Splinter MD (pgr) [phone]    ([phone] 04/21/2013, 2:53 PM

## 2013-04-21 NOTE — Consult Note (Signed)
WOC wound consult note Reason for Consult:Right BKA incision line cellulitis Wound type:surgical Pressure Ulcer POA: No Measurement:20cm incision line that is 75% closely approximated and healed.  Scattered areas of dried eschar present making up 25% of incision line, the largest area is located at the medial end and measures 2cm x 1.5cm. There is no periwound erythema, induration or exudate noted. Wound bed:As described above. Drainage (amount, consistency, odor) None Periwound:Unremarkable Dressing procedure/placement/frequency:I will implement a twice daily saline dressing to support the autolysis of the dried eschar and expect that this will eventually dry and fall off atraumatically. I have also provided a Prevalon pressure redistribution boot for his left LE and ordered for topical bacitracin to the two abraded areas on his face at the right brow and the bridge of nose. [place] nursing team will not follow, but will remain available to this patient, the nursing and medical team.  Please re-consult if needed. Thanks, Maudie Flakes, MSN, RN, [place], [place], [place] [phone])

## 2013-04-21 NOTE — Progress Notes (Signed)
UR completed 

## 2013-04-21 NOTE — Consult Note (Signed)
Indication for Consultation:  Management of ESRD/hemodialysis; anemia, hypertension/volume and secondary hyperparathyroidism  HPI: Chris Vance is a 73 y.o. male who was admitted after falling from his wheelchair and hitting his head yesterday at the SNF. He receives HD MWF @ [place], history of HTN, DM. He was admitted for further eval given increased confusion noted but a family member, he is currently confused and unable to give history.   Past Medical History  Diagnosis Date  . Renal disorder   . Diabetes mellitus   . Hypertension   . Anemia   . Hyperlipidemia   . Thyroid disease   . Anxiety   . Skin cancer    Past Surgical History  Procedure Laterality Date  . Av fistula placement    . Amputation Right 02/13/2013    Procedure: Right Below Knee Amputation;  Surgeon: Newt Minion, MD;  Location: [place];  Service: Orthopedics;  Laterality: Right;  Right Below Knee Amputation   Family History  Problem Relation Age of Onset  . Asthma Father    Social History:  reports that he has quit smoking. His smoking use included Cigarettes. He started smoking about 56 years ago. He has a 55 pack-year smoking history. He has never used smokeless tobacco. He reports that he does not drink alcohol or use illicit drugs. Allergies  Allergen Reactions  . Ace Inhibitors   . Lisinopril Cough   Prior to Admission medications   Medication Sig Start Date End Date Taking? Authorizing Provider  acetaminophen (TYLENOL) 325 MG tablet Take 650 mg by mouth every 6 (six) hours as needed for mild pain or moderate pain (or Fever >/= 101). 02/16/13  Yes Marrion Coy, MD  Amino Acids-Protein Hydrolys (FEEDING SUPPLEMENT, PRO-STAT SUGAR FREE 64,) LIQD Take 30 mLs by mouth 2 (two) times daily.   Yes Historical Provider, MD  amiodarone (PACERONE) 200 MG tablet Take 200 mg by mouth 2 (two) times daily. 02/16/13  Yes Marrion Coy, MD  calcium acetate (PHOSLO) 667 MG capsule Take 2,001 mg by mouth  3 (three) times daily with meals.   Yes Historical Provider, MD  escitalopram (LEXAPRO) 10 MG tablet Take 10 mg by mouth daily.   Yes Historical Provider, MD  gabapentin (NEURONTIN) 300 MG capsule Take 300 mg by mouth at bedtime.   Yes Historical Provider, MD  HYDROcodone-acetaminophen (NORCO/VICODIN) 5-325 MG per tablet Take 1 tablet by mouth every 6 (six) hours as needed for moderate pain. 02/17/13  Yes Estill Dooms, MD  lidocaine-prilocaine (EMLA) cream Apply 1 application topically 3 (three) times a week. Dialysis med   Yes Historical Provider, MD  metoprolol (LOPRESSOR) 100 MG tablet Take 100 mg by mouth 2 (two) times daily. 02/16/13  Yes Marrion Coy, MD  omeprazole (PRILOSEC) 20 MG capsule Take 20 mg by mouth daily.   Yes Historical Provider, MD  warfarin (COUMADIN) 1 MG tablet Take 1 mg by mouth daily.   Yes Historical Provider, MD  levofloxacin (LEVAQUIN) 500 MG tablet Take 1 tablet (500 mg total) by mouth every other day. Start 04/22/13 04/22/13   Orson Eva, MD   Current Facility-Administered Medications  Medication Dose Route Frequency Provider Last Rate Last Dose  . acetaminophen (TYLENOL) tablet 650 mg  650 mg Oral Q6H PRN Annita Brod, MD      . amiodarone (PACERONE) tablet 200 mg  200 mg Oral BID Annita Brod, MD   200 mg at 04/21/13 0920  . calcium acetate (Phos Binder) (PHOSLYRA) 667 MG/5ML oral  solution 2,001 mg  2,001 mg Oral TID WC Annita Brod, MD      . darbepoetin [facility]) injection 25 mcg  25 mcg Intravenous Q Tue-HD Marlena Clipper, NP      . feeding supplement (RESOURCE BREEZE) (RESOURCE BREEZE) liquid 1 Container  1 Container Oral TID WC Annita Brod, MD   1 Container at 04/21/13 0800  . gabapentin (NEURONTIN) capsule 300 mg  300 mg Oral QHS Annita Brod, MD   300 mg at 04/20/13 2201  . HYDROcodone-acetaminophen (NORCO/VICODIN) 5-325 MG per tablet 1 tablet  1 tablet Oral Q6H PRN Annita Brod, MD      . insulin aspart (novoLOG)  injection 0-9 Units  0-9 Units Subcutaneous TID WC Annita Brod, MD      . Derrill Memo ON 04/22/2013] levofloxacin [facility]) tablet 500 mg  500 mg Oral Q48H Patsey Berthold [place], RPH      . lidocaine-prilocaine (EMLA) cream 1 application  1 application Topical Once per day on Mon Wed Fri Annita Brod, MD      . metoprolol (LOPRESSOR) tablet 100 mg  100 mg Oral BID Annita Brod, MD   100 mg at 04/21/13 0920  . pantoprazole (PROTONIX) EC tablet 40 mg  40 mg Oral Daily Annita Brod, MD   40 mg at 04/21/13 [id number]  . Warfarin - Pharmacist Dosing Inpatient   Does not apply Nelson, [facility]       Labs: Basic Metabolic Panel:  Recent Labs Lab 04/20/13 1205 04/21/13 0533  NA 137 135*  K 4.7 4.6  CL 93* 93*  CO2 25 27  GLUCOSE 85 61*  BUN 53* 63*  CREATININE 5.94* 6.68*  CALCIUM 9.8 9.8   Liver Function Tests:  Recent Labs Lab 04/20/13 1205  AST 12  ALT 9  ALKPHOS 137*  BILITOT 0.7  PROT 6.9  ALBUMIN 2.4*   No results found for this basename: LIPASE, AMYLASE,  in the last 168 hours No results found for this basename: AMMONIA,  in the last 168 hours CBC:  Recent Labs Lab 04/20/13 1205 04/21/13 0533  WBC 16.1* 13.4*  NEUTROABS 13.6*  --   HGB 10.8* 9.9*  HCT 32.7* 29.5*  MCV 96.5 95.2  PLT 345 323   Cardiac Enzymes:  Recent Labs Lab 04/20/13 1205  TROPONINI <0.30   CBG:  Recent Labs Lab 04/20/13 1920 04/20/13 2200 04/21/13 0818 04/21/13 0901 04/21/13 1150  GLUCAP 126* 103* 69* 97 119*   Iron Studies: No results found for this basename: IRON, TIBC, TRANSFERRIN, FERRITIN,  in the last 72 hours Studies/Results: Dg Thoracic Spine 2 View  04/20/2013   CLINICAL DATA:  Fall.  EXAM: THORACIC SPINE - 2 VIEW  COMPARISON:  PA and lateral chest 01/29/2013.  FINDINGS: No fracture is identified. Marked multilevel degenerative change is seen. Aortic atherosclerosis is noted.  IMPRESSION: No acute finding.   Electronically Signed   By: Inge Rise M.D.   On: 04/20/2013 13:46   Dg Shoulder Right  04/20/2013   CLINICAL DATA:  Fall.  EXAM: RIGHT SHOULDER - 2+ VIEW  COMPARISON:  None.  FINDINGS: The humerus is located and the acromioclavicular joint is intact. The humerus is high-riding consistent with chronic rotator cuff tear. Acromioclavicular degenerative change is seen. There are subacute to remote fractures of the right third and fourth ribs.  IMPRESSION: No acute finding.  Subacute to remote right third and fourth rib fractures.  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   Electronically Signed   By: Inge Rise M.D.   On: 04/20/2013 13:48   Dg Wrist Complete Right  04/20/2013   CLINICAL DATA:  [place].  Wrist and hand pain.  Lacerations.  EXAM: RIGHT WRIST - COMPLETE 3+ VIEW  COMPARISON:  Views of the hand on the same day  FINDINGS: There is normal alignment of the carpals. No evidence for acute fracture or dislocation.  There are surgical clips in the region of the wrist. A soft tissue mass projects over the soft tissues adjacent to the distal radius, measuring 4.9 x 3.5 cm. Scar represent hematoma or other soft tissue mass. There is dense atherosclerotic calcification of the small vessels in the region of the wrist and hand.  IMPRESSION: 1. No evidence for acute fracture. 2. Soft tissue mass or hematoma adjacent to the distal radius. 3. Postoperative changes.   Electronically Signed   By: Shon Hale M.D.   On: 04/20/2013 13:51   Ct Head Wo Contrast  04/20/2013   CLINICAL DATA:  Status post fall with a blow to the head. The patient is anticoagulated. Laceration above right eyebrow.  EXAM: CT HEAD WITHOUT CONTRAST  CT CERVICAL SPINE WITHOUT CONTRAST  TECHNIQUE: Multidetector CT imaging of the head and cervical spine was performed following the standard protocol without intravenous contrast. Multiplanar CT image reconstructions of the cervical spine were also generated.  COMPARISON:   Head CT scan 01/30/2013.  FINDINGS: CT HEAD FINDINGS  Laceration above the right eye is seen. Cortical atrophy and chronic microvascular ischemic change are identified. There is no evidence of acute intracranial abnormality including infarct, hemorrhage, mass lesion, mass effect, midline shift or abnormal extra-axial fluid collection. No hydrocephalus or pneumocephalus. The calvarium is intact. Small mucous retention cysts or polyps are seen in the left maxillary sinus. Mild mucosal thickening right maxillary sinus is identified. No fracture is seen.  CT CERVICAL SPINE FINDINGS  No fracture or malalignment is identified. The patient has marked multilevel degenerative disease. Lung apices demonstrate extensive emphysema. Bilateral upper lobe airspace disease is present, worse on the left.  IMPRESSION: Laceration above the right eye without underlying fracture or acute intracranial abnormality.  No acute abnormality cervical spine.  Emphysema with extensive bilateral upper lobe airspace disease most compatible with pneumonia.  Atrophy and microvascular ischemic change.  Multilevel spondylosis.   Electronically Signed   By: Inge Rise M.D.   On: 04/20/2013 13:31   Ct Cervical Spine Wo Contrast  04/20/2013   CLINICAL DATA:  Status post fall with a blow to the head. The patient is anticoagulated. Laceration above right eyebrow.  EXAM: CT HEAD WITHOUT CONTRAST  CT CERVICAL SPINE WITHOUT CONTRAST  TECHNIQUE: Multidetector CT imaging of the head and cervical spine was performed following the standard protocol without intravenous contrast. Multiplanar CT image reconstructions of the cervical spine were also generated.  COMPARISON:  Head CT scan 01/30/2013.  FINDINGS: CT HEAD FINDINGS  Laceration above the right eye is seen. Cortical atrophy and chronic microvascular ischemic change are identified. There is no evidence of acute intracranial abnormality including infarct, hemorrhage, mass lesion, mass effect, midline  shift or abnormal extra-axial fluid collection. No hydrocephalus or pneumocephalus. The calvarium is intact. Small mucous retention cysts or polyps are seen in the left maxillary sinus. Mild mucosal thickening right maxillary sinus is identified. No fracture is seen.  CT CERVICAL SPINE FINDINGS  No fracture or malalignment is identified. The patient has marked multilevel degenerative  disease. Lung apices demonstrate extensive emphysema. Bilateral upper lobe airspace disease is present, worse on the left.  IMPRESSION: Laceration above the right eye without underlying fracture or acute intracranial abnormality.  No acute abnormality cervical spine.  Emphysema with extensive bilateral upper lobe airspace disease most compatible with pneumonia.  Atrophy and microvascular ischemic change.  Multilevel spondylosis.   Electronically Signed   By: Inge Rise M.D.   On: 04/20/2013 13:31   Dg Chest Port 1 View  04/20/2013   CLINICAL DATA:  Status post fall  EXAM: PORTABLE CHEST - 1 VIEW  COMPARISON:  DG THORACIC SPINE dated 04/20/2013; DG RIBS BILATERAL W/CHEST dated 01/30/2013; DG CHEST 2 VIEW dated 01/29/2013  FINDINGS: The lungs are well-expanded. The interstitial markings are increased bilaterally as compared to the previous studies. The cardiopericardial silhouette remains enlarged. The pulmonary vascularity is indistinct. There is no pleural effusion. The observed portions of the bony thorax exhibit no acute abnormalities.  IMPRESSION: Increased interstitial markings bilaterally are worrisome for pulmonary interstitial edema likely secondary to cardiac etiologies. No alveolar pneumonia is demonstrated.   Electronically Signed   By: David  [place]ique   On: 04/20/2013 15:18   Dg Hand Complete Right  04/20/2013   CLINICAL DATA:  [place]. Lacerations of the right hand and wrist. Wrist pain.  EXAM: RIGHT HAND - COMPLETE 3+ VIEW  COMPARISON:  None.  FINDINGS: There is a large soft tissue mass along the radial aspect of the  distal forearm. No radiopaque foreign body identified. Surgical clips are identified in the wrist. There is no evidence for acute fracture or dislocation. Degenerative changes are present. These primarily involve the distal interphalangeal joints in the radiocarpal joint. There is significant atherosclerotic calcification of the small vessels.  IMPRESSION: 1.  No evidence for acute osseous abnormality. 2. Large soft tissue swelling/mass involving the distal aspect of the forearm. 3. Postoperative changes.   Electronically Signed   By: Shon Hale M.D.   On: 04/20/2013 13:49    ROS: Unable to obtain due to altered mental status.   Physical Exam: Filed Vitals:   04/21/13 0510 04/21/13 0600 04/21/13 0820 04/21/13 1153  BP: 121/68  125/76 109/69  Pulse: 84  90 85  Temp: 97.4 F (36.3 C)  97.7 F (36.5 C) 97.5 F (36.4 C)  TempSrc: Oral  Oral Oral  Resp: 15  16 17   Height:  5\' 10"  (1.778 m)    Weight:  75.6 kg (166 lb 10.7 oz)    SpO2: 93%  95% 93%     General: Well developed, well nourished, in no acute distress. Head: Normocephalic, sclera non-icteric, mucus membranes are moist. R eye bruised and facial laceration Neck: Supple. Slight JVD Lungs: Clear bilaterally to auscultation without wheezes, rales, or rhonchi. Breathing is unlabored, dim bases. Heart: irregular with S1 S2. systolic murmur. No rubs, or gallops appreciated. Abdomen: Soft, non-tender, non-distended with normoactive bowel sounds. No rebound/guarding. No obvious abdominal masses. M-S:  Strength and tone appear normal for age. Lower extremities: R BKA  Neuro: Awake, confused. Does not follow commands or answer questions appropriatly  Dialysis Access: R AVF +bruit/thrill  Dialysis Orders:  MWF @ [place] 3:45  72kgs 2K/2.5ca  180  450/1.5   7800 Heparin  R AVF Profile 4 No hecotorol  Epogen2000  Units IV/HD  No Venofer  *   Assessment/Plan: 1. S/P mechanical fall- Head CT- no fracture. R shoulder and Hand xray- no acute  abnormality. Thoracic spine- no acute finding 2. PNA- chest xray-  bilat infiltrates. blood cultures pending. Started levaquin. WBC 13.4- afebrile 3. ESRD -  MWF @ [place], missed yesterday. Pending today K+4.6 4. Hypertension/volume  - 109/69. Home metop 5. Anemia  - hgb 9.9. Cont epo. No outpt Iron 6. Metabolic bone disease -  Ca 9.8phoslo with meals 7. Nutrition - alb 2.4. Encourage protien. Renal diet  Shelle Iron, NP [place] [phone] 04/21/2013, 12:57 PM   I have seen and examined patient, discussed with PA and agree with assessment and plan as outlined above. Kelly Splinter MD pager [phone]    cell [phone] 04/21/2013, 3:02 PM

## 2013-04-21 NOTE — Clinical Social Work Psychosocial (Addendum)
Clinical Social Work Department BRIEF PSYCHOSOCIAL ASSESSMENT 04/21/2013  Patient:  Chris Vance, Chris Vance     Account Number:  [id number]     Admit date:  04/20/2013  Clinical Social Worker:  Frederico Hamman  Date/Time:  04/21/2013 12:00 M  Referred by:  Physician  Date Referred:  04/21/2013 Referred for  SNF Placement   Other Referral:   Interview type:  Other - See comment Other interview type:   CSW talked with admissions staff at [place] Admitted from facility:  [facility] Level of care:  [place] Primary support name:  Chris Vance Primary support relationship to patient:  CHILD, ADULT Degree of support available:   Level of support unknown    CURRENT CONCERNS Current Concerns  Post-Acute Placement   Other Concerns:    SOCIAL WORK ASSESSMENT / PLAN CSW advised that patient ready for discharge today. Facility contacted and informed and d/c summary transmitted to SNF. CSW talked with patient in the dialysis center and could detect that he is currently confused as he felt the staff were holding him there. CSW contacted patient's son Chris Vance [phone]) and informed him that patient will discharge back to SNF this evening after dialysis.   Assessment/plan status:  No Further Intervention Required Other assessment/ plan:   Information/referral to community resources:   None needed or requested at this time.    PATIENT'S/FAMILY'S RESPONSE TO PLAN OF CARE: Patient was alert and confused. Son thanked CSW for notification.  **Patient will discharge back to [facility] skilled nursing facility this evening after dialysis, transported by ambulance.

## 2013-04-25 ENCOUNTER — Emergency Department (HOSPITAL_COMMUNITY)
Admission: EM | Admit: 2013-04-25 | Discharge: 2013-04-25 | Disposition: A | Discharge status: Home / Self Care | Payer: Medicare Other | Attending: Emergency Medicine | Admitting: Emergency Medicine

## 2013-04-25 ENCOUNTER — Encounter (HOSPITAL_COMMUNITY): Payer: Self-pay | Admitting: Emergency Medicine

## 2013-04-25 ENCOUNTER — Emergency Department (HOSPITAL_COMMUNITY): Payer: Medicare Other

## 2013-04-25 DIAGNOSIS — Z862 Personal history of diseases of the blood and blood-forming organs and certain disorders involving the immune mechanism: Secondary | ICD-10-CM | POA: Insufficient documentation

## 2013-04-25 DIAGNOSIS — Y9389 Activity, other specified: Secondary | ICD-10-CM | POA: Insufficient documentation

## 2013-04-25 DIAGNOSIS — R040 Epistaxis: Secondary | ICD-10-CM | POA: Insufficient documentation

## 2013-04-25 DIAGNOSIS — R5383 Other fatigue: Secondary | ICD-10-CM

## 2013-04-25 DIAGNOSIS — Y9210 Unspecified residential institution as the place of occurrence of the external cause: Secondary | ICD-10-CM | POA: Insufficient documentation

## 2013-04-25 DIAGNOSIS — R29810 Facial weakness: Secondary | ICD-10-CM | POA: Insufficient documentation

## 2013-04-25 DIAGNOSIS — R5381 Other malaise: Secondary | ICD-10-CM | POA: Insufficient documentation

## 2013-04-25 DIAGNOSIS — E119 Type 2 diabetes mellitus without complications: Secondary | ICD-10-CM | POA: Insufficient documentation

## 2013-04-25 DIAGNOSIS — W19XXXA Unspecified fall, initial encounter: Secondary | ICD-10-CM

## 2013-04-25 DIAGNOSIS — R0989 Other specified symptoms and signs involving the circulatory and respiratory systems: Secondary | ICD-10-CM | POA: Insufficient documentation

## 2013-04-25 DIAGNOSIS — I10 Essential (primary) hypertension: Secondary | ICD-10-CM | POA: Insufficient documentation

## 2013-04-25 DIAGNOSIS — Z87448 Personal history of other diseases of urinary system: Secondary | ICD-10-CM | POA: Insufficient documentation

## 2013-04-25 DIAGNOSIS — Z7901 Long term (current) use of anticoagulants: Secondary | ICD-10-CM | POA: Insufficient documentation

## 2013-04-25 DIAGNOSIS — F411 Generalized anxiety disorder: Secondary | ICD-10-CM | POA: Insufficient documentation

## 2013-04-25 DIAGNOSIS — S0990XA Unspecified injury of head, initial encounter: Secondary | ICD-10-CM | POA: Insufficient documentation

## 2013-04-25 DIAGNOSIS — R4789 Other speech disturbances: Secondary | ICD-10-CM | POA: Insufficient documentation

## 2013-04-25 DIAGNOSIS — Z87891 Personal history of nicotine dependence: Secondary | ICD-10-CM | POA: Insufficient documentation

## 2013-04-25 DIAGNOSIS — W06XXXA Fall from bed, initial encounter: Secondary | ICD-10-CM | POA: Insufficient documentation

## 2013-04-25 DIAGNOSIS — Z85828 Personal history of other malignant neoplasm of skin: Secondary | ICD-10-CM | POA: Insufficient documentation

## 2013-04-25 DIAGNOSIS — Z79899 Other long term (current) drug therapy: Secondary | ICD-10-CM | POA: Insufficient documentation

## 2013-04-25 DIAGNOSIS — S022XXA Fracture of nasal bones, initial encounter for closed fracture: Secondary | ICD-10-CM | POA: Insufficient documentation

## 2013-04-25 DIAGNOSIS — S0180XA Unspecified open wound of other part of head, initial encounter: Secondary | ICD-10-CM | POA: Insufficient documentation

## 2013-04-25 DIAGNOSIS — S0181XA Laceration without foreign body of other part of head, initial encounter: Secondary | ICD-10-CM

## 2013-04-25 LAB — BASIC METABOLIC PANEL
BUN: 21 mg/dL (ref 6–23)
CHLORIDE: 96 meq/L (ref 96–112)
CO2: 28 meq/L (ref 19–32)
CREATININE: 3.18 mg/dL — AB (ref 0.50–1.35)
Calcium: 8.9 mg/dL (ref 8.4–10.5)
GFR calc Af Amer: 21 mL/min — ABNORMAL LOW (ref >90)
GFR calc non Af Amer: 18 mL/min — ABNORMAL LOW (ref >90)
GLUCOSE: 136 mg/dL — AB (ref 70–99)
Potassium: 3.3 mEq/L — ABNORMAL LOW (ref 3.7–5.3)
Sodium: 136 mEq/L — ABNORMAL LOW (ref 137–147)

## 2013-04-25 LAB — CBC WITH DIFFERENTIAL/PLATELET
BASOS PCT: 0 % (ref 0–1)
Basophils Absolute: 0 10*3/uL (ref 0.0–0.1)
Eosinophils Absolute: 0.1 10*3/uL (ref 0.0–0.7)
Eosinophils Relative: 1 % (ref 0–5)
HCT: 30.5 % — ABNORMAL LOW (ref 39.0–52.0)
Hemoglobin: 10 g/dL — ABNORMAL LOW (ref 13.0–17.0)
LYMPHS PCT: 6 % — AB (ref 12–46)
Lymphs Abs: 0.6 10*3/uL — ABNORMAL LOW (ref 0.7–4.0)
MCH: 31.3 pg (ref 26.0–34.0)
MCHC: 32.8 g/dL (ref 30.0–36.0)
MCV: 95.6 fL (ref 78.0–100.0)
MONO ABS: 1 10*3/uL (ref 0.1–1.0)
MONOS PCT: 9 % (ref 3–12)
NEUTROS ABS: 8.7 10*3/uL — AB (ref 1.7–7.7)
Neutrophils Relative %: 84 % — ABNORMAL HIGH (ref 43–77)
Platelets: 301 10*3/uL (ref 150–400)
RBC: 3.19 MIL/uL — ABNORMAL LOW (ref 4.22–5.81)
RDW: 16.3 % — ABNORMAL HIGH (ref 11.5–15.5)
WBC: 10.5 10*3/uL (ref 4.0–10.5)

## 2013-04-25 LAB — PROTIME-INR
INR: 1.9 — ABNORMAL HIGH (ref 0.00–1.49)
Prothrombin Time: 21.2 seconds — ABNORMAL HIGH (ref 11.6–15.2)

## 2013-04-25 MED ORDER — SODIUM CHLORIDE 0.9 % IV BOLUS (SEPSIS)
1000.0000 mL | Freq: Once | INTRAVENOUS | Status: AC
  Administered 2013-04-25: 1000 mL via INTRAVENOUS

## 2013-04-25 NOTE — ED Notes (Signed)
Pt asked for urine sample reports he hasn't made urine in 6 months.

## 2013-04-25 NOTE — ED Notes (Signed)
Per EMS- Pt comes from [facility] after he fell out of bed this morning. Has hx of same last week. Has 1.5 inch laceration to forehead, bleeding controlled is taking coumadin. Has abrasion to left elbow skin tear which is new. Has various other bandages to arms from previous falls. Is dialysis patient. Facility report he is at baseline. Currently being treated for UTI with levaquin. Denies h/a, pupils equal and reactive. BP 141/73, HR 86, 96% RA, 18 RR, CBG 173.

## 2013-04-25 NOTE — ED Notes (Signed)
[facility] arrived to transport pt back to [facility]. Pt has cell phone and wallet in his lap at time of discharge.

## 2013-04-25 NOTE — ED Provider Notes (Signed)
CSN: [id number]     Arrival date & time 04/25/13  [id number] History   First MD Initiated Contact with Patient 04/25/13 [phone]     Chief Complaint  Patient presents with  . Fall     (Consider location/radiation/quality/duration/timing/severity/associated sxs/prior Treatment) HPI 73 yo male presents after fall out of his bed this morning around 10 am. Patient lives at [facility], nursing facility. Patient states he was sitting on the edge of his bed and then remembers falling on the floor.  Patient denies LOC, chest pain, SOB, dizziness, lightheadedness, HA, or palpitations. Patient admits to some generalized weakness. Patient is currently on Coumadin therapy. Admits to Head and facial trauma during fall.  Denies hx of stroke. PMH significant for hx of Bell's Palsy, HTN, HLD, T2DM, and A.fib.  Past Medical History  Diagnosis Date  . Renal disorder   . Diabetes mellitus   . Hypertension   . Anemia   . Hyperlipidemia   . Thyroid disease   . Anxiety   . Skin cancer    Past Surgical History  Procedure Laterality Date  . Av fistula placement    . Amputation Right 02/13/2013    Procedure: Right Below Knee Amputation;  Surgeon: Newt Minion, MD;  Location: [facility];  Service: Orthopedics;  Laterality: Right;  Right Below Knee Amputation   Family History  Problem Relation Age of Onset  . Asthma Father    History  Substance Use Topics  . Smoking status: Former Smoker -- 1.00 packs/day for 55 years    Types: Cigarettes    Start date: 02/26/1957  . Smokeless tobacco: Never Used     Comment: No cigarettes x 3-4 weeks.  . Alcohol Use: No    Review of Systems  All other systems reviewed and are negative.      Allergies  Ace inhibitors and Lisinopril  Home Medications   Current Outpatient Rx  Name  Route  Sig  Dispense  Refill  . acetaminophen (TYLENOL) 325 MG tablet   Oral   Take 650 mg by mouth every 6 (six) hours as needed for mild pain or moderate pain (or Fever >/= 101).        . Amino Acids-Protein Hydrolys (FEEDING SUPPLEMENT, PRO-STAT SUGAR FREE 64,) LIQD   Oral   Take 30 mLs by mouth 2 (two) times daily.         Marland Kitchen amiodarone (PACERONE) 200 MG tablet   Oral   Take 200 mg by mouth 2 (two) times daily.         . calcium acetate (PHOSLO) 667 MG capsule   Oral   Take 2,001 mg by mouth 3 (three) times daily with meals.         Marland Kitchen escitalopram (LEXAPRO) 10 MG tablet   Oral   Take 10 mg by mouth daily.         Marland Kitchen gabapentin (NEURONTIN) 300 MG capsule   Oral   Take 300 mg by mouth at bedtime.         Marland Kitchen HYDROcodone-acetaminophen (NORCO/VICODIN) 5-325 MG per tablet   Oral   Take 1 tablet by mouth every 6 (six) hours as needed for moderate pain.         Marland Kitchen levofloxacin (LEVAQUIN) 500 MG tablet   Oral   Take 1 tablet (500 mg total) by mouth every other day. Start 04/22/13   3 tablet   0   . lidocaine-prilocaine (EMLA) cream   Topical   Apply 1 application topically 3 (  three) times a week. Dialysis med         . metoprolol (LOPRESSOR) 100 MG tablet   Oral   Take 100 mg by mouth 2 (two) times daily.         Marland Kitchen omeprazole (PRILOSEC) 20 MG capsule   Oral   Take 20 mg by mouth daily.         Marland Kitchen warfarin (COUMADIN) 1 MG tablet   Oral   Take 1 mg by mouth daily.          BP 147/96  Pulse 89  Temp(Src) 97.5 F (36.4 C) (Oral)  Resp 26  SpO2 100% Physical Exam  Nursing note and vitals reviewed. Constitutional: He is oriented to person, place, and time. He appears well-developed and well-nourished. No distress.  HENT:  Head: Normocephalic and atraumatic.    Nose: Epistaxis (bleeding controlled) is observed.  Mouth/Throat: Uvula is midline and oropharynx is clear and moist. Mucous membranes are dry.  4 cm head laceration. No foreign bodies or debris visualized. Bleeding controlled. No bruising.   Tenderness at nasal bridge. Ecchymosis at right inferior orbit. No periorbital edema present.   Epistaxis of Left nares,  controlled. Dried blood in posterior pharnyx. Appears to be draining from nose. No active bleeding noted. No oral lesions or lacerations noted.   Tongue very dry and sticky.     Eyes: Conjunctivae and EOM are normal. Pupils are equal, round, and reactive to light. Right conjunctiva is not injected. Right conjunctiva has no hemorrhage. Left conjunctiva is not injected. Left conjunctiva has no hemorrhage.  Neck: Trachea normal, normal range of motion and phonation normal. Neck supple. No JVD present. Carotid bruit is present (RIGHT carotid artery). No tracheal deviation present.  Cardiovascular: Normal rate and regular rhythm.  Exam reveals no gallop and no friction rub.   No murmur heard. Pulmonary/Chest: Effort normal and breath sounds normal. No respiratory distress. He has no wheezes. He has no rhonchi. He has no rales.  Musculoskeletal: Normal range of motion. He exhibits no edema.  Neurological: He is alert and oriented to person, place, and time. He has normal strength. No sensory deficit. GCS eye subscore is 4. GCS verbal subscore is 5. GCS motor subscore is 6.  Right sided facial droop.  Slurred speech.  No unilateral extremity weakness.  Cerebellar function grossly intact with finger to nose.   Skin: Skin is warm and dry. He is not diaphoretic.     Right below knee amputation. Healing Decubitus ulcer at right distal lateral limb as depicted above in diagram. No evidence of cellulitis or purulent drainage.    Psychiatric: He has a normal mood and affect. His behavior is normal.    ED Course  Procedures (including critical care time) Labs Review Labs Reviewed  CBC WITH DIFFERENTIAL - Abnormal; Notable for the following:    RBC 3.19 (*)    Hemoglobin 10.0 (*)    HCT 30.5 (*)    RDW 16.3 (*)    Neutrophils Relative % 84 (*)    Neutro Abs 8.7 (*)    Lymphocytes Relative 6 (*)    Lymphs Abs 0.6 (*)    All other components within normal limits  BASIC METABOLIC PANEL - Abnormal;  Notable for the following:    Sodium 136 (*)    Potassium 3.3 (*)    Glucose, Bld 136 (*)    Creatinine, Ser 3.18 (*)    GFR calc non Af Amer 18 (*)    GFR calc  Af Amer 21 (*)    All other components within normal limits  PROTIME-INR - Abnormal; Notable for the following:    Prothrombin Time 21.2 (*)    INR 1.90 (*)    All other components within normal limits  URINALYSIS, ROUTINE W REFLEX MICROSCOPIC   Imaging Review Ct Head Wo Contrast  04/25/2013   CLINICAL DATA:  [place] out of bed this morning, laceration to forehead  EXAM: CT HEAD WITHOUT CONTRAST  CT MAXILLOFACIAL WITHOUT CONTRAST  TECHNIQUE: Multidetector CT imaging of the head and maxillofacial structures were performed using the standard protocol without intravenous contrast. Multiplanar CT image reconstructions of the maxillofacial structures were also generated.  COMPARISON:  CT HEAD W/O CM dated 04/20/2013; CT C SPINE W/O CM dated 04/20/2013  FINDINGS: CT HEAD FINDINGS  Age-related atrophy and small-vessel ischemic change to the white matter is stable. No hemorrhage, fracture, or extra-axial fluid. No infarct or mass effect.  CT MAXILLOFACIAL FINDINGS  Left maxillary incisor dentigerous cyst seen incidentally. Nondisplaced fracture at the bridge of the nose. No other facial bone fractures. Mild inflammatory change in the sinuses.  IMPRESSION: No acute intracranial abnormality. Nondisplaced fracture at the bridge of the nose. No other nasal bone fractures.   Electronically Signed   By: Skipper Cliche M.D.   On: 04/25/2013 10:59   Ct Maxillofacial Wo Cm  04/25/2013   CLINICAL DATA:  [place] out of bed this morning, laceration to forehead  EXAM: CT HEAD WITHOUT CONTRAST  CT MAXILLOFACIAL WITHOUT CONTRAST  TECHNIQUE: Multidetector CT imaging of the head and maxillofacial structures were performed using the standard protocol without intravenous contrast. Multiplanar CT image reconstructions of the maxillofacial structures were also generated.   COMPARISON:  CT HEAD W/O CM dated 04/20/2013; CT C SPINE W/O CM dated 04/20/2013  FINDINGS: CT HEAD FINDINGS  Age-related atrophy and small-vessel ischemic change to the white matter is stable. No hemorrhage, fracture, or extra-axial fluid. No infarct or mass effect.  CT MAXILLOFACIAL FINDINGS  Left maxillary incisor dentigerous cyst seen incidentally. Nondisplaced fracture at the bridge of the nose. No other facial bone fractures. Mild inflammatory change in the sinuses.  IMPRESSION: No acute intracranial abnormality. Nondisplaced fracture at the bridge of the nose. No other nasal bone fractures.   Electronically Signed   By: Skipper Cliche M.D.   On: 04/25/2013 10:59    EKG Interpretation   Date/Time:  Saturday April 25 2013 11:21:22 EST Ventricular Rate:  89 PR Interval:    QRS Duration: 130 QT Interval:  478 QTC Calculation: 582 R Axis:   65 Text Interpretation:  Atrial flutter LVH with secondary repolarization  abnormality Prolonged QT interval Baseline wander in lead(s) V4 No  significant change was found Confirmed by CAMPOS  MD, Lennette Bihari ([id number]) on  04/25/2013 1:19:28 PM     LACERATION REPAIR Performed by: Sherrie George Consent: Verbal consent obtained. Risks and benefits: risks, benefits and alternatives were discussed Patient identity confirmed: provided demographic data Time out performed prior to procedure Prepped and Draped in normal sterile fashion Wound explored Laceration Location: forehead Laceration Length: 4cm No Foreign Bodies seen or palpated Anesthesia: local infiltration Local anesthetic:N/A Anesthetic total: N/A Irrigation method: syringe Amount of cleaning: standard Skin closure: Dermabond  Patient tolerance: Patient tolerated the procedure well with no immediate complications.  MDM   Final diagnoses:  Fall  Nasal fracture  Laceration of forehead    Patient afebrile with normal VS.  Hgb stable at 10 No leukocytosis Creatinine stable at  3.18  INR at 1.9 Mild hypokalemia CT shows nondisplaced nasal fracture. No intracranial abnormalities.   Patient discussed with Dr. Jola Schmidt.   plan to have patient follow up with PCP. Discussed results with patient. Advised return to ED should patient develop any unilateral weakness, HA, slurred speech, or change in mental status.   Meds given in ED:  Medications  sodium chloride 0.9 % bolus 1,000 mL (0 mLs Intravenous Stopped 04/25/13 1330)    New Prescriptions   No medications on file         Sherrie George, [place] 04/25/13 1604

## 2013-04-25 NOTE — ED Notes (Signed)
Pt provided sprite and graham cracker. Waiting for [facility] transport.

## 2013-04-25 NOTE — ED Notes (Signed)
Pt provided sprite. MD to bedside to assess patient.

## 2013-04-25 NOTE — ED Notes (Signed)
Pt to beside to apply dermabond. Pt's son calling and speaking with patient.

## 2013-04-25 NOTE — ED Notes (Signed)
1 inch laceration to forehead. Gauze applied, bleeding ceased. Pt takes coumadin.

## 2013-04-25 NOTE — ED Notes (Signed)
Attempt to call patient's son with no answer.

## 2013-04-26 NOTE — ED Provider Notes (Signed)
Medical screening examination/treatment/procedure(s) were conducted as a shared visit with non-physician practitioner(s) and myself.  I personally evaluated the patient during the encounter.   EKG Interpretation   Date/Time:  Saturday April 25 2013 11:21:22 EST Ventricular Rate:  89 PR Interval:    QRS Duration: 130 QT Interval:  478 QTC Calculation: 582 R Axis:   65 Text Interpretation:  Atrial flutter LVH with secondary repolarization  abnormality Prolonged QT interval Baseline wander in lead(s) V4 No  significant change was found Confirmed by Helene Bernstein  MD, Lamonica Trueba ([id number]) on  04/25/2013 1:19:28 PM      Fowler mechanical fall.  Doubt stroke.  5 out of 5 strength in bilateral upper lower extremity major muscle groups.  Nasal fracture, outpatient PCP followup.  C-spine nontender.  Full range of motion of bilateral hips.  Right BKA without signs of infection. Dg Thoracic Spine 2 View  04/20/2013   CLINICAL DATA:  Fall.  EXAM: THORACIC SPINE - 2 VIEW  COMPARISON:  PA and lateral chest 01/29/2013.  FINDINGS: No fracture is identified. Marked multilevel degenerative change is seen. Aortic atherosclerosis is noted.  IMPRESSION: No acute finding.   Electronically Signed   By: Drusilla Kanner M.D.   On: 04/20/2013 13:46   Dg Shoulder Right  04/20/2013   CLINICAL DATA:  Fall.  EXAM: RIGHT SHOULDER - 2+ VIEW  COMPARISON:  None.  FINDINGS: The humerus is located and the acromioclavicular joint is intact. The humerus is high-riding consistent with chronic rotator cuff tear. Acromioclavicular degenerative change is seen. There are subacute to remote fractures of the right third and fourth ribs.  IMPRESSION: No acute finding.  Subacute to remote right third and fourth rib fractures.  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   Electronically Signed   By: Drusilla Kanner M.D.   On: 04/20/2013 13:48   Dg Wrist Complete Right  04/20/2013   CLINICAL  DATA:  Larey Seat.  Wrist and hand pain.  Lacerations.  EXAM: RIGHT WRIST - COMPLETE 3+ VIEW  COMPARISON:  Views of the hand on the same day  FINDINGS: There is normal alignment of the carpals. No evidence for acute fracture or dislocation.  There are surgical clips in the region of the wrist. A soft tissue mass projects over the soft tissues adjacent to the distal radius, measuring 4.9 x 3.5 cm. Scar represent hematoma or other soft tissue mass. There is dense atherosclerotic calcification of the small vessels in the region of the wrist and hand.  IMPRESSION: 1. No evidence for acute fracture. 2. Soft tissue mass or hematoma adjacent to the distal radius. 3. Postoperative changes.   Electronically Signed   By: Rosalie Gums M.D.   On: 04/20/2013 13:51   Ct Head Wo Contrast  04/25/2013   CLINICAL DATA:  Lare[place] Seat out of bed this morning, laceration to forehead  EXAM: CT HEAD WITHOUT CONTRAST  CT MAXILLOFACIAL WITHOUT CONTRAST  TECHNIQUE: Multidetector CT imaging of the head and maxillofacial structures were performed using the standard protocol without intravenous contrast. Multiplanar CT image reconstructions of the maxillofacial structures were also generated.  COMPARISON:  CT HEAD W/O CM dated 04/20/2013; CT C SPINE W/O CM dated 04/20/2013  FINDINGS: CT HEAD FINDINGS  Age-related atrophy and small-vessel ischemic change to the white matter is stable. No hemorrhage, fracture, or extra-axial fluid. No infarct or mass effect.  CT MAXILLOFACIAL FINDINGS  Left maxillary incisor dentigerous cyst seen incidentally. Nondisplaced fracture at the bridge of the nose. No other  facial bone fractures. Mild inflammatory change in the sinuses.  IMPRESSION: No acute intracranial abnormality. Nondisplaced fracture at the bridge of the nose. No other nasal bone fractures.   Electronically Signed   By: Skipper Cliche M.D.   On: 04/25/2013 10:59   Ct Head Wo Contrast  04/20/2013   CLINICAL DATA:  Status post fall with a blow to the head. The  patient is anticoagulated. Laceration above right eyebrow.  EXAM: CT HEAD WITHOUT CONTRAST  CT CERVICAL SPINE WITHOUT CONTRAST  TECHNIQUE: Multidetector CT imaging of the head and cervical spine was performed following the standard protocol without intravenous contrast. Multiplanar CT image reconstructions of the cervical spine were also generated.  COMPARISON:  Head CT scan 01/30/2013.  FINDINGS: CT HEAD FINDINGS  Laceration above the right eye is seen. Cortical atrophy and chronic microvascular ischemic change are identified. There is no evidence of acute intracranial abnormality including infarct, hemorrhage, mass lesion, mass effect, midline shift or abnormal extra-axial fluid collection. No hydrocephalus or pneumocephalus. The calvarium is intact. Small mucous retention cysts or polyps are seen in the left maxillary sinus. Mild mucosal thickening right maxillary sinus is identified. No fracture is seen.  CT CERVICAL SPINE FINDINGS  No fracture or malalignment is identified. The patient has marked multilevel degenerative disease. Lung apices demonstrate extensive emphysema. Bilateral upper lobe airspace disease is present, worse on the left.  IMPRESSION: Laceration above the right eye without underlying fracture or acute intracranial abnormality.  No acute abnormality cervical spine.  Emphysema with extensive bilateral upper lobe airspace disease most compatible with pneumonia.  Atrophy and microvascular ischemic change.  Multilevel spondylosis.   Electronically Signed   By: Inge Rise M.D.   On: 04/20/2013 13:31   Ct Cervical Spine Wo Contrast  04/20/2013   CLINICAL DATA:  Status post fall with a blow to the head. The patient is anticoagulated. Laceration above right eyebrow.  EXAM: CT HEAD WITHOUT CONTRAST  CT CERVICAL SPINE WITHOUT CONTRAST  TECHNIQUE: Multidetector CT imaging of the head and cervical spine was performed following the standard protocol without intravenous contrast. Multiplanar CT  image reconstructions of the cervical spine were also generated.  COMPARISON:  Head CT scan 01/30/2013.  FINDINGS: CT HEAD FINDINGS  Laceration above the right eye is seen. Cortical atrophy and chronic microvascular ischemic change are identified. There is no evidence of acute intracranial abnormality including infarct, hemorrhage, mass lesion, mass effect, midline shift or abnormal extra-axial fluid collection. No hydrocephalus or pneumocephalus. The calvarium is intact. Small mucous retention cysts or polyps are seen in the left maxillary sinus. Mild mucosal thickening right maxillary sinus is identified. No fracture is seen.  CT CERVICAL SPINE FINDINGS  No fracture or malalignment is identified. The patient has marked multilevel degenerative disease. Lung apices demonstrate extensive emphysema. Bilateral upper lobe airspace disease is present, worse on the left.  IMPRESSION: Laceration above the right eye without underlying fracture or acute intracranial abnormality.  No acute abnormality cervical spine.  Emphysema with extensive bilateral upper lobe airspace disease most compatible with pneumonia.  Atrophy and microvascular ischemic change.  Multilevel spondylosis.   Electronically Signed   By: Inge Rise M.D.   On: 04/20/2013 13:31   Dg Chest Port 1 View  04/20/2013   CLINICAL DATA:  Status post fall  EXAM: PORTABLE CHEST - 1 VIEW  COMPARISON:  DG THORACIC SPINE dated 04/20/2013; DG RIBS BILATERAL W/CHEST dated 01/30/2013; DG CHEST 2 VIEW dated 01/29/2013  FINDINGS: The lungs are well-expanded. The interstitial markings  are increased bilaterally as compared to the previous studies. The cardiopericardial silhouette remains enlarged. The pulmonary vascularity is indistinct. There is no pleural effusion. The observed portions of the bony thorax exhibit no acute abnormalities.  IMPRESSION: Increased interstitial markings bilaterally are worrisome for pulmonary interstitial edema likely secondary to cardiac  etiologies. No alveolar pneumonia is demonstrated.   Electronically Signed   By: David  Martinique   On: 04/20/2013 15:18   Dg Hand Complete Right  04/20/2013   CLINICAL DATA:  [place]. Lacerations of the right hand and wrist. Wrist pain.  EXAM: RIGHT HAND - COMPLETE 3+ VIEW  COMPARISON:  None.  FINDINGS: There is a large soft tissue mass along the radial aspect of the distal forearm. No radiopaque foreign body identified. Surgical clips are identified in the wrist. There is no evidence for acute fracture or dislocation. Degenerative changes are present. These primarily involve the distal interphalangeal joints in the radiocarpal joint. There is significant atherosclerotic calcification of the small vessels.  IMPRESSION: 1.  No evidence for acute osseous abnormality. 2. Large soft tissue swelling/mass involving the distal aspect of the forearm. 3. Postoperative changes.   Electronically Signed   By: Shon Hale M.D.   On: 04/20/2013 13:49   Ct Maxillofacial Wo Cm  04/25/2013   CLINICAL DATA:  [place] out of bed this morning, laceration to forehead  EXAM: CT HEAD WITHOUT CONTRAST  CT MAXILLOFACIAL WITHOUT CONTRAST  TECHNIQUE: Multidetector CT imaging of the head and maxillofacial structures were performed using the standard protocol without intravenous contrast. Multiplanar CT image reconstructions of the maxillofacial structures were also generated.  COMPARISON:  CT HEAD W/O CM dated 04/20/2013; CT C SPINE W/O CM dated 04/20/2013  FINDINGS: CT HEAD FINDINGS  Age-related atrophy and small-vessel ischemic change to the white matter is stable. No hemorrhage, fracture, or extra-axial fluid. No infarct or mass effect.  CT MAXILLOFACIAL FINDINGS  Left maxillary incisor dentigerous cyst seen incidentally. Nondisplaced fracture at the bridge of the nose. No other facial bone fractures. Mild inflammatory change in the sinuses.  IMPRESSION: No acute intracranial abnormality. Nondisplaced fracture at the bridge of the nose. No other  nasal bone fractures.   Electronically Signed   By: Skipper Cliche M.D.   On: 04/25/2013 10:59  I personally reviewed the imaging tests through PACS system I reviewed available ER/hospitalization records through the [facility], MD 04/26/13 [phone]

## 2013-04-27 LAB — CULTURE, BLOOD (ROUTINE X 2)
CULTURE: NO GROWTH
Culture: NO GROWTH

## 2013-04-28 ENCOUNTER — Non-Acute Institutional Stay (SKILLED_NURSING_FACILITY): Payer: Medicare Other | Admitting: Internal Medicine

## 2013-04-28 DIAGNOSIS — E1159 Type 2 diabetes mellitus with other circulatory complications: Secondary | ICD-10-CM

## 2013-04-28 DIAGNOSIS — J189 Pneumonia, unspecified organism: Secondary | ICD-10-CM

## 2013-04-28 DIAGNOSIS — N186 End stage renal disease: Secondary | ICD-10-CM

## 2013-04-29 NOTE — Progress Notes (Addendum)
Patient ID: Chris Vance, male   DOB: 1940/10/12, 73 y.o.   MRN: [id number]                 HISTORY & PHYSICAL  DATE:  04/28/2013    FACILITY: [facility]    LEVEL OF CARE:   SNF   CHIEF COMPLAINT:  Readmission to the facility, post stay at [facility].    HISTORY OF PRESENT ILLNESS:  Chris Vance is a gentleman who came here in early January.  He had been admitted to hospital for deteriorating wounds in his right foot.  An MRI of the foot showed diffuse cellulitis and myositis.  He ultimately underwent a right below-knee amputation.    The patient was admitted on this occasion after having fallen.  He was discovered to have multiple rib fractures and an incidental pneumonia.  He was treated with Levaquin.  CT scan of the brain and C-spine were negative.  Chest x-ray showed bibasilar infiltrates.  X-ray suggested remote to subacute right third and fourth rib fractures.    PAST MEDICAL HISTORY/PROBLEM LIST:  Right below-knee amputation.    Atrial fibrillation, on Coumadin.    End-stage renal disease, on dialysis.    Healthcare-acquired pneumonia.    Type 2 diabetes with PCD.    Diastolic heart failure.    Nonhealing wound over the left fifth metatarsal head which is probing down 1 cm.    Scattered areas of eschar over the incision line of his right stump.  This is most problematic in the medial aspect and will likely require further debridement.    CURRENT MEDICATIONS:  Discharge medications include:    Amiodarone 200 b.i.d.    PhosLo 2000 three times daily.    Lexapro 10 q.d.    Neurontin 300 at bedtime.    Norco 5/325, 1 tablet q.6 p.r.n.    Levaquin 500 every other day.    Metoprolol 100 b.i.d.    Prilosec 20 q.d.    Coumadin 1 mg daily.  INR should be next checked on 05/01/2013.    SOCIAL HISTORY: HOUSING:  The patient was admitted here in early January.  Premorbidly, he was living on his own in [place].   RESPONSIBLE PARTY:  He is his own  responsible party.   FUNCTIONAL STATUS:  He is on dialysis Monday, Wednesday, and Friday.  His exact previous functional status is unclear.   CODE STATUS:  He has no other advanced directives.   TOBACCO USE:  He quit smoking many years ago.    FAMILY HISTORY: FATHER:  Asthma in his father.    REVIEW OF SYSTEMS:   GENERAL:  The patient is not in any distress although he had another recent fall, suffering a skin tear on his right hand and a laceration on his forehead.   CHEST/RESPIRATORY:  No cough.  No sputum.   CARDIAC:   No chest pain.   No syncope.   GI:  No nausea,  vomiting or abdominal pain.  No diarrhea.    PHYSICAL EXAMINATION:   GENERAL APPEARANCE:  The patient looks to be not in any distress.   CHEST/RESPIRATORY:  Clear air entry bilaterally.   CARDIOVASCULAR:  CARDIAC:  Pansystolic murmur over the PMI that does not radiate.  He appears to be euvolemic.   GASTROINTESTINAL:  LIVER/SPLEEN/KIDNEYS:  No liver, no spleen.  No tenderness.   SKIN:  INSPECTION:  Extremities:  He has scattered areas of eschar over his right stump.  As mentioned, these are going to  need further debridement.  Continue Santyl to loosen the eschar.  There is also an area over his left fifth metatarsal head which is small and probes down 1 cm.  This has been roughly unchanged since his arrival here.   PSYCHIATRIC:   MENTAL STATUS:   Although there is some complaint from the nurses that he is more confused, I see none of this today.     ASSESSMENT/PLAN:  Healthcare-acquired pneumonia, recent fall with subacute rib fractures.  He appears to be stable.    Atrial fibrillation.  On Coumadin.  He is on 1 mg.  I am somewhat concerned that off the quinolone, his INR will drop.  Therefore, I will recheck this later this week.    Chronic renal failure.  On longstanding dialysis.    History of multiple rib fractures.  Presumably secondary to falls.    Hyperlipidemia.    Anemia secondary to chronic disease.     Nonhealing wounds over his stump.    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  We will use Iodosorb here.    Skin tear over his right hand.   I will use Santyl on this area, as well.  This very well could need further debridement, as well.

## 2013-05-05 ENCOUNTER — Encounter (HOSPITAL_BASED_OUTPATIENT_CLINIC_OR_DEPARTMENT_OTHER): Payer: Medicare Other

## 2013-05-05 ENCOUNTER — Non-Acute Institutional Stay (SKILLED_NURSING_FACILITY): Payer: Medicare Other | Admitting: Internal Medicine

## 2013-05-05 DIAGNOSIS — R404 Transient alteration of awareness: Secondary | ICD-10-CM

## 2013-05-05 DIAGNOSIS — N186 End stage renal disease: Secondary | ICD-10-CM

## 2013-05-05 DIAGNOSIS — L97509 Non-pressure chronic ulcer of other part of unspecified foot with unspecified severity: Secondary | ICD-10-CM

## 2013-05-05 DIAGNOSIS — E1159 Type 2 diabetes mellitus with other circulatory complications: Secondary | ICD-10-CM

## 2013-05-07 NOTE — Progress Notes (Signed)
Patient ID: Chris Vance, male   DOB: Jun 13, 1940, 73 y.o.   MRN: [id number]                  PROGRESS NOTE  DATE:  05/05/2013    FACILITY: [facility]    LEVEL OF CARE:   SNF   Acute Visit   CHIEF COMPLAINT:  Follow up increasing confusion, falls.     HISTORY OF PRESENT ILLNESS:  I have received notification on Chris Vance that multiple staff members are finding him more confused.  He had several falls, more recently hit his head and was sent to the ER.  CT scan of the head from 04/25/2013 showed no acute cranial abnormalities.    I just readmitted him to the facility on 04/28/2013.  At that point, he was in the hospital for a fall and discovered to have multiple rib fractures and incidental pneumonia.  Chest x-ray showed bibasilar infiltrates.  He was discharged here on Levaquin.    REVIEW OF SYSTEMS:   CHEST/RESPIRATORY:  The patient is not complaining of shortness of breath.   CARDIAC:   No clear chest pain.   GI:  He is able to tell me he has no appetite, but has not vomited.    PHYSICAL EXAMINATION:   VITAL SIGNS:   BLOOD PRESSURE:  128/62.   PULSE:  78.   TEMPERATURE:  98.9.   RESPIRATIONS:  16.   GENERAL APPEARANCE:  His speech is somewhat lethargic but he is awake, coherent, orientated.   CHEST/RESPIRATORY:  He has right lower lobe crackles greater than left lower lobe crackles.  However, his work of breathing is normal.  There is no wheezing.   CARDIOVASCULAR:  CARDIAC:   Heart sounds are normal.  If anything, he is slightly volume-contracted.   GASTROINTESTINAL:  LIVER/SPLEEN/KIDNEYS:  No liver, no spleen.   ABDOMEN:  He has some tenderness in the right upper quadrant, but no guarding or rebound.   SKIN:  INSPECTION:  He has wounds over his original BKA that have not changed all that much.   He has a wound over his left fifth metatarsal head that has some sanguineous drainage which I have re-cultured.  ASSESSMENT/PLAN:  Question mild delirium.   I am going  to repeat lab work.    Atrial fibrillation.  On Coumadin and amiodarone.  I have seen amiodarone cause a failure to thrive syndrome in nursing home patients before.  I may consider reducing his dose.    Diastolic heart failure.  I see no evidence of this.    Recent bibasilar pneumonia for which he completed Levaquin.  The auscultation may reflect this.     Chronic renal failure.  On dialysis.    Deteriorating wound over the left fifth metatarsal head, which I have re-cultured.  I will change the dressing to silver alginate.      CPT CODE: 54627

## 2013-05-11 ENCOUNTER — Non-Acute Institutional Stay (SKILLED_NURSING_FACILITY): Payer: Medicare Other | Admitting: Nurse Practitioner

## 2013-05-11 ENCOUNTER — Encounter: Payer: Self-pay | Admitting: Nurse Practitioner

## 2013-05-11 DIAGNOSIS — E785 Hyperlipidemia, unspecified: Secondary | ICD-10-CM

## 2013-05-11 DIAGNOSIS — Z89511 Acquired absence of right leg below knee: Secondary | ICD-10-CM

## 2013-05-11 DIAGNOSIS — N039 Chronic nephritic syndrome with unspecified morphologic changes: Secondary | ICD-10-CM

## 2013-05-11 DIAGNOSIS — I739 Peripheral vascular disease, unspecified: Secondary | ICD-10-CM

## 2013-05-11 DIAGNOSIS — N186 End stage renal disease: Secondary | ICD-10-CM

## 2013-05-11 DIAGNOSIS — D631 Anemia in chronic kidney disease: Secondary | ICD-10-CM

## 2013-05-11 DIAGNOSIS — S2249XA Multiple fractures of ribs, unspecified side, initial encounter for closed fracture: Secondary | ICD-10-CM

## 2013-05-11 DIAGNOSIS — E039 Hypothyroidism, unspecified: Secondary | ICD-10-CM

## 2013-05-11 DIAGNOSIS — S91309A Unspecified open wound, unspecified foot, initial encounter: Secondary | ICD-10-CM

## 2013-05-11 DIAGNOSIS — S91302A Unspecified open wound, left foot, initial encounter: Secondary | ICD-10-CM

## 2013-05-11 DIAGNOSIS — I4891 Unspecified atrial fibrillation: Secondary | ICD-10-CM

## 2013-05-11 DIAGNOSIS — F411 Generalized anxiety disorder: Secondary | ICD-10-CM

## 2013-05-11 DIAGNOSIS — S88119A Complete traumatic amputation at level between knee and ankle, unspecified lower leg, initial encounter: Secondary | ICD-10-CM

## 2013-05-11 DIAGNOSIS — E1159 Type 2 diabetes mellitus with other circulatory complications: Secondary | ICD-10-CM

## 2013-05-11 DIAGNOSIS — N189 Chronic kidney disease, unspecified: Secondary | ICD-10-CM

## 2013-05-11 DIAGNOSIS — J189 Pneumonia, unspecified organism: Secondary | ICD-10-CM

## 2013-05-11 DIAGNOSIS — F419 Anxiety disorder, unspecified: Secondary | ICD-10-CM

## 2013-05-11 NOTE — Progress Notes (Signed)
Patient ID: Chris Vance, male   DOB: 1941-01-23, 73 y.o.   MRN: [id number]   Nursing Home Location:  [place] [facility] Service: SNF (22)  PCP: Tamsen Roers, MD  Allergies  Allergen Reactions  . Ace Inhibitors   . Lisinopril Cough    Chief Complaint  Patient presents with  . Medical Managment of Chronic Issues    HPI:  73 year old gentleman who is seen today for routine follow up and maintenance of routine health issues including AF, CHF, PVD, T2DM, HCAP, ESRD, Right BKA, left foot wound, and sepsis. Staff has been noticing increased confusion and disorientation with patient. The staff reports multiple falls within the last several days without significant injury. Safety measures are in place including, mattress on the floor beside patient's bed. His left foot wound is looking worse with more drainage with elevation of white count. He is currently taking Augmentin 543m for this wound that is positive for S. Aureus. The wound care nurse of the facility is performing regular wound care. Of note, on 05/06/13, his TSH was reported as 14.7. He is tolerating po meals.  Review of Systems:  Review of Systems  Constitutional: Negative for chills, weight loss, malaise/fatigue and diaphoresis. Fever: temperature reading 98.8 (o)  HENT: Negative for congestion.   Eyes: Negative for blurred vision.  Respiratory: Negative for cough, sputum production, shortness of breath and wheezing.   Cardiovascular: Negative for chest pain, palpitations and leg swelling.  Gastrointestinal: Negative for heartburn, nausea, diarrhea and constipation.  Genitourinary:       ESRD patient on HD through right arm fistula.   Musculoskeletal: Positive for falls (frequently falling, no acute injury). Negative for back pain, joint pain and myalgias.  Skin: Negative for rash.  Neurological: Negative for dizziness, sensory change, seizures and headaches. Weakness: denies.  Endo/Heme/Allergies:  Bruises/bleeds easily.  Psychiatric/Behavioral: Positive for memory loss (as reported by staff). Negative for depression, suicidal ideas and hallucinations. The patient is not nervous/anxious and does not have insomnia.      Past Medical History  Diagnosis Date  . Renal disorder   . Diabetes mellitus   . Hypertension   . Anemia   . Hyperlipidemia   . Thyroid disease   . Anxiety   . Skin cancer    Past Surgical History  Procedure Laterality Date  . Av fistula placement    . Amputation Right 02/13/2013    Procedure: Right Below Knee Amputation;  Surgeon: MNewt Minion MD;  [place] [facility]  Service: Orthopedics;  Laterality: Right;  Right Below Knee Amputation   Social History:   reports that he has quit smoking. His smoking use included Cigarettes. He started smoking about 56 years ago. He has a 55 pack-year smoking history. He has never used smokeless tobacco. He reports that he does not drink alcohol or use illicit drugs.  Family History  Problem Relation Age of Onset  . Asthma Father     Medications: Patient's Medications  New Prescriptions   No medications on file  Previous Medications   ACETAMINOPHEN (TYLENOL) 325 MG TABLET    Take 650 mg by mouth every 6 (six) hours as needed for mild pain or moderate pain (or Fever >/= 101).   AMINO ACIDS-PROTEIN HYDROLYS (FEEDING SUPPLEMENT, PRO-STAT SUGAR FREE 64,) LIQD    Take 30 mLs by mouth 2 (two) times daily.   AMIODARONE (PACERONE) 200 MG TABLET    Take 200 mg by mouth 2 (two) times daily.  CALCIUM ACETATE (PHOSLO) 667 MG CAPSULE    Take 2,001 mg by mouth 3 (three) times daily with meals.   ESCITALOPRAM (LEXAPRO) 10 MG TABLET    Take 10 mg by mouth daily.   GABAPENTIN (NEURONTIN) 300 MG CAPSULE    Take 300 mg by mouth at bedtime.   HYDROCODONE-ACETAMINOPHEN (NORCO/VICODIN) 5-325 MG PER TABLET    Take 1 tablet by mouth every 6 (six) hours as needed for moderate pain.   LEVOFLOXACIN (LEVAQUIN) 500 MG TABLET    Take 1 tablet  (500 mg total) by mouth every other day. Start 04/22/13   LIDOCAINE-PRILOCAINE (EMLA) CREAM    Apply 1 application topically 3 (three) times a week. Dialysis med   METOPROLOL (LOPRESSOR) 100 MG TABLET    Take 100 mg by mouth 2 (two) times daily.   OMEPRAZOLE (PRILOSEC) 20 MG CAPSULE    Take 20 mg by mouth daily.   WARFARIN (COUMADIN) 1 MG TABLET    Take 1 mg by mouth daily.  Modified Medications   No medications on file  Discontinued Medications   No medications on file     Physical Exam:  Filed Vitals:   05/11/13 1150  BP: 124/68  Pulse: 71  Temp: 98.8 F (37.1 C)  Resp: 18  Weight: 160 lb (72.576 kg)  SpO2: 98%  Physical Exam  Nursing note and vitals reviewed. Constitutional: He is oriented to person, place, and time. No distress.  Alert, pleasant, and cooperative gentlman. Assisted back to bed for exam from W/C.   HENT:  Head: Normocephalic and atraumatic.  Eyes: Conjunctivae are normal. Pupils are equal, round, and reactive to light.  Neck: Neck supple.  Cardiovascular: Normal rate and regular rhythm.   Pulmonary/Chest: Effort normal and breath sounds normal. No respiratory distress. He has no wheezes.  Abdominal: Soft. Bowel sounds are normal. He exhibits no distension and no mass. There is no tenderness. There is no guarding.  Musculoskeletal: He exhibits no edema and no tenderness.  Neurological: He is alert and oriented to person, place, and time. Abnormal coordination: chronic, related to right BKA.  Patient well oriented to person, place, month/year, on exam and participates during exam, reporting areas of pain and other specific details appropriately.  Skin: Skin is warm and dry. No rash noted. He is not diaphoretic. There is erythema. No pallor.  Thick burgundy purulent discharge of moderate amount from left foot wound with mild odor. Scattered circular erythemic areas to right stump, the largest of which has a boggy center and is tender.    Psychiatric: Mood,  memory, affect and judgment normal.       Labs reviewed:   PT (Prothrombin Time)    Result: 05/11/2013 11:21 AM   ( Status: F )     C Prothrombin Time 20.5   H 11.6-15.2 seconds SLN   INR 1.80   H <1.50  SLN C   Sensitivity for: STAPHYLOCOCCUS AUREUS Source: LT 5TH PLANTER DIABETIC ULCER   Result: 05/10/2013 7:42 AM   ( Status: F )       PENICILLIN R, >=0.5   >=0.5      OXACILLIN S, 0.5   0.5      CEFAZOLIN S     GENTAMICIN S, <=0.5   <=0.5      CIPROFLOXACIN R, >=8   >=8      LEVOFLOXACIN I, 4   4      MOXIFLOXACIN R, 2   2      TRIMETH/SULFA S, <=  10   <=10      VANCOMYCIN S, 1   1      CLINDAMYCIN S, <=0.25   <=0.25      ERYTHROMYCIN S, <=0.25   <=0.25      LINEZOLID S, 2   2      QUINUPRISTIN/DALF S, <=0.25   <=0.25      RIFAMPIN S, <=0.5   <=0.5      TETRACYCLINE S, <=1   <=1      -- END OF REPORT --   CBC with Diff    Result: 05/06/2013 10:50 AM   ( Status: F )     C WBC 12.1   H 4.0-10.5 K/uL SLN   RBC 3.02   L 4.22-5.81 MIL/uL SLN   Hemoglobin 9.1   L 13.0-17.0 g/dL SLN   Hematocrit 28.4   L 39.0-52.0 % SLN   MCV 94.0     78.0-100.0 fL SLN   MCH 30.1     26.0-34.0 pg SLN   MCHC 32.0     30.0-36.0 g/dL SLN   RDW 15.7   H 11.5-15.5 % SLN   Platelet Count 401   H 150-400 K/uL SLN   Granulocyte % 84   H 43-77 % SLN   Absolute Gran 10.2   H 1.7-7.7 K/uL SLN   Lymph % 5   L 12-46 % SLN   Absolute Lymph 0.6   L 0.7-4.0 K/uL SLN   Mono % 10     3-12 % SLN   Absolute Mono 1.2   H 0.1-1.0 K/uL SLN   Eos % 1     0-5 % SLN   Absolute Eos 0.1     0.0-0.7 K/uL SLN   Baso % 0     0-1 % SLN   Absolute Baso 0.0     0.0-0.1 K/uL SLN   Smear Review Criteria for review not met  SLN    Comprehensive Metabolic Panel    Result: 05/06/2013 2:24 PM   ( Status: F )       Sodium 137     135-145 mEq/L SLN   Potassium 3.3   L 3.5-5.3 mEq/L SLN   Chloride 97     96-112 mEq/L SLN   CO2 26     19-32 mEq/L SLN   Glucose 99     70-99 mg/dL SLN   BUN 29   H 6-23 mg/dL SLN     Creatinine 4.73   H 0.50-1.35 mg/dL SLN   Bilirubin, Total 0.7     0.2-1.2 mg/dL SLN   Alkaline Phosphatase 102     39-117 U/L SLN   AST/SGOT 6     0-37 U/L SLN   ALT/SGPT <8     0-53 U/L SLN   Total Protein 5.7   L 6.0-8.3 g/dL SLN   Albumin 2.6   L 3.5-5.2 g/dL SLN   Calcium 8.7     8.4-10.5 mg/dL SLN   Lipase    Result: 05/06/2013 2:24 PM   ( Status: F )       Lipase <10     0-75 U/L SLN    TSH, Ultrasensitive    Result: 05/06/2013 12:39 PM   ( Status: F )       TSH 14.739   H 0.350-4.500 uIU/mL SLN    Recent Labs  02/06/13 0927  02/09/13 0555  02/17/13 0712 04/20/13 1205 04/21/13 0533 04/25/13 0949  NA 140  < > 140  < >  135 137 135* 136*  K 4.5  < > 4.3  < > 3.7 4.7 4.6 3.3*  CL 98  < > 96  < > 95* 93* 93* 96  CO2 24  < > 25  < > '27 25 27 28  ' GLUCOSE 139*  < > 149*  < > 164* 85 61* 136*  BUN 36*  < > 37*  < > 17 53* 63* 21  CREATININE 6.02*  < > 6.77*  < > 4.34* 5.94* 6.68* 3.18*  CALCIUM 9.8  < > 9.8  < > 8.8 9.8 9.8 8.9  PHOS 5.4*  --  4.9*  --  3.0  --   --   --   < > = values in this interval not displayed. Liver Function Tests:  Recent Labs  01/30/13 1541  02/06/13 0927 02/17/13 0712 04/20/13 1205  AST 14  --  16  --  12  ALT 12  --  11  --  9  ALKPHOS 141*  --  103  --  137*  BILITOT 1.7*  --  0.9  --  0.7  PROT 7.3  --  6.2  --  6.9  ALBUMIN 3.4*  < > 2.6*  2.6* 2.3* 2.4*  < > = values in this interval not displayed. No results found for this basename: LIPASE, AMYLASE,  in the last 8760 hours No results found for this basename: AMMONIA,  in the last 8760 hours CBC:  Recent Labs  02/16/13 0545  04/20/13 1205 04/21/13 0533 04/25/13 0949  WBC 13.5*  < > 16.1* 13.4* 10.5  NEUTROABS 11.0*  --  13.6*  --  8.7*  HGB 12.8*  < > 10.8* 9.9* 10.0*  HCT 39.9  < > 32.7* 29.5* 30.5*  MCV 98.8  < > 96.5 95.2 95.6  PLT 235  < > 345 323 301  < > = values in this interval not displayed.    Assessment/Plan 1. Peripheral vascular disease Will monitor  for further complication and treat as needed.  2. Type II or unspecified type diabetes mellitus with peripheral circulatory disorders, uncontrolled(250.72) FSBS controlled, with FSBS fasting less than 120-160 routinely. Will continue current regimen and monitor for hypoglycemia.   3. Atrial fibrillation Stable, no issues with rapid ventricular rate. Continue amiodarone and warfarin. INR 1.8 today. Will recheck in 3 days and monitor for bleeding in light of increased falling.   4. HCAP (healthcare-associated pneumonia) Stable, no respiratory issues. Will continue to monitor.  5. Hypothyroidism  TSH 14.739 on 3/11. Pt currently not on any medications for thyroid; Will start Synthroid 32mg daily and recheck TSH in 6 weeks.   6. Multiple rib fractures From old fall; no pain or respiratory distress or complication. Will continue to monitor.   7. End stage renal disease Continues a MWF schedule-having treatment today. Fistula without complication.   8. Anemia of chronic renal failure Most recent Hgb 9.1. Will continue to monitor.   9. Anxiety Continues on Lexapro. Will monitor for any needs, but mood is currently stable.  10. Open wound of left foot Due to worsening presentation of left foot wound at metatarsal head and leukocytosis plan is to change antibiotics and dialysis MD plans to treat with Vancomycin and Fortaz for 5 days by staff report. Will order xray of left foot to r/o osteomyelitis. Continues to receive regular wound care from facility wound care nurse.   11. Hx of right BKA Will monitor for worsening; as some areas appear with  increased bogginess and tenderness. No open lesions at this point to stump, but may occur- conts to follow up with treatment nurse and wound care.   12. Hypoalbuminemia Total protein and Albumin both low. Continue supplementation and monitor levels for repletion requirements as needed. Encourage po intake and document meal consumption.

## 2013-05-12 ENCOUNTER — Non-Acute Institutional Stay (SKILLED_NURSING_FACILITY): Payer: Medicare Other | Admitting: Internal Medicine

## 2013-05-12 ENCOUNTER — Inpatient Hospital Stay (HOSPITAL_COMMUNITY)
Admission: EM | Admit: 2013-05-12 | Discharge: 2013-05-22 | DRG: 255 | Disposition: A | Discharge status: Skilled Nursing Facility | Payer: Medicare Other | Attending: Family Medicine | Admitting: Family Medicine

## 2013-05-12 ENCOUNTER — Emergency Department (HOSPITAL_COMMUNITY): Payer: Medicare Other

## 2013-05-12 ENCOUNTER — Encounter (HOSPITAL_COMMUNITY): Payer: Self-pay | Admitting: Emergency Medicine

## 2013-05-12 DIAGNOSIS — I120 Hypertensive chronic kidney disease with stage 5 chronic kidney disease or end stage renal disease: Secondary | ICD-10-CM | POA: Diagnosis present

## 2013-05-12 DIAGNOSIS — D62 Acute posthemorrhagic anemia: Secondary | ICD-10-CM | POA: Diagnosis not present

## 2013-05-12 DIAGNOSIS — L97509 Non-pressure chronic ulcer of other part of unspecified foot with unspecified severity: Secondary | ICD-10-CM | POA: Diagnosis present

## 2013-05-12 DIAGNOSIS — S88119A Complete traumatic amputation at level between knee and ankle, unspecified lower leg, initial encounter: Secondary | ICD-10-CM

## 2013-05-12 DIAGNOSIS — L0390 Cellulitis, unspecified: Secondary | ICD-10-CM

## 2013-05-12 DIAGNOSIS — E1169 Type 2 diabetes mellitus with other specified complication: Secondary | ICD-10-CM

## 2013-05-12 DIAGNOSIS — Z87891 Personal history of nicotine dependence: Secondary | ICD-10-CM

## 2013-05-12 DIAGNOSIS — S91309A Unspecified open wound, unspecified foot, initial encounter: Secondary | ICD-10-CM

## 2013-05-12 DIAGNOSIS — E1159 Type 2 diabetes mellitus with other circulatory complications: Principal | ICD-10-CM

## 2013-05-12 DIAGNOSIS — I471 Supraventricular tachycardia, unspecified: Secondary | ICD-10-CM

## 2013-05-12 DIAGNOSIS — J189 Pneumonia, unspecified organism: Secondary | ICD-10-CM

## 2013-05-12 DIAGNOSIS — E785 Hyperlipidemia, unspecified: Secondary | ICD-10-CM

## 2013-05-12 DIAGNOSIS — E039 Hypothyroidism, unspecified: Secondary | ICD-10-CM | POA: Diagnosis present

## 2013-05-12 DIAGNOSIS — M869 Osteomyelitis, unspecified: Secondary | ICD-10-CM

## 2013-05-12 DIAGNOSIS — M86679 Other chronic osteomyelitis, unspecified ankle and foot: Secondary | ICD-10-CM | POA: Diagnosis present

## 2013-05-12 DIAGNOSIS — I5032 Chronic diastolic (congestive) heart failure: Secondary | ICD-10-CM

## 2013-05-12 DIAGNOSIS — R0600 Dyspnea, unspecified: Secondary | ICD-10-CM

## 2013-05-12 DIAGNOSIS — Z992 Dependence on renal dialysis: Secondary | ICD-10-CM

## 2013-05-12 DIAGNOSIS — N19 Unspecified kidney failure: Secondary | ICD-10-CM

## 2013-05-12 DIAGNOSIS — S91302A Unspecified open wound, left foot, initial encounter: Secondary | ICD-10-CM

## 2013-05-12 DIAGNOSIS — S98139A Complete traumatic amputation of one unspecified lesser toe, initial encounter: Secondary | ICD-10-CM

## 2013-05-12 DIAGNOSIS — A4901 Methicillin susceptible Staphylococcus aureus infection, unspecified site: Secondary | ICD-10-CM

## 2013-05-12 DIAGNOSIS — E1142 Type 2 diabetes mellitus with diabetic polyneuropathy: Secondary | ICD-10-CM | POA: Diagnosis present

## 2013-05-12 DIAGNOSIS — E1149 Type 2 diabetes mellitus with other diabetic neurological complication: Secondary | ICD-10-CM | POA: Diagnosis present

## 2013-05-12 DIAGNOSIS — R410 Disorientation, unspecified: Secondary | ICD-10-CM

## 2013-05-12 DIAGNOSIS — S2249XA Multiple fractures of ribs, unspecified side, initial encounter for closed fracture: Secondary | ICD-10-CM

## 2013-05-12 DIAGNOSIS — Z89511 Acquired absence of right leg below knee: Secondary | ICD-10-CM

## 2013-05-12 DIAGNOSIS — N186 End stage renal disease: Secondary | ICD-10-CM

## 2013-05-12 DIAGNOSIS — L02619 Cutaneous abscess of unspecified foot: Secondary | ICD-10-CM | POA: Diagnosis present

## 2013-05-12 DIAGNOSIS — N039 Chronic nephritic syndrome with unspecified morphologic changes: Secondary | ICD-10-CM

## 2013-05-12 DIAGNOSIS — E11621 Type 2 diabetes mellitus with foot ulcer: Secondary | ICD-10-CM

## 2013-05-12 DIAGNOSIS — J811 Chronic pulmonary edema: Secondary | ICD-10-CM

## 2013-05-12 DIAGNOSIS — C4490 Unspecified malignant neoplasm of skin, unspecified: Secondary | ICD-10-CM

## 2013-05-12 DIAGNOSIS — D631 Anemia in chronic kidney disease: Secondary | ICD-10-CM

## 2013-05-12 DIAGNOSIS — E876 Hypokalemia: Secondary | ICD-10-CM

## 2013-05-12 DIAGNOSIS — N2581 Secondary hyperparathyroidism of renal origin: Secondary | ICD-10-CM

## 2013-05-12 DIAGNOSIS — L97529 Non-pressure chronic ulcer of other part of left foot with unspecified severity: Secondary | ICD-10-CM

## 2013-05-12 DIAGNOSIS — A419 Sepsis, unspecified organism: Secondary | ICD-10-CM

## 2013-05-12 DIAGNOSIS — L089 Local infection of the skin and subcutaneous tissue, unspecified: Secondary | ICD-10-CM

## 2013-05-12 DIAGNOSIS — F419 Anxiety disorder, unspecified: Secondary | ICD-10-CM

## 2013-05-12 DIAGNOSIS — F05 Delirium due to known physiological condition: Secondary | ICD-10-CM

## 2013-05-12 DIAGNOSIS — I509 Heart failure, unspecified: Secondary | ICD-10-CM | POA: Diagnosis present

## 2013-05-12 DIAGNOSIS — L03119 Cellulitis of unspecified part of limb: Secondary | ICD-10-CM

## 2013-05-12 DIAGNOSIS — E46 Unspecified protein-calorie malnutrition: Secondary | ICD-10-CM | POA: Diagnosis present

## 2013-05-12 DIAGNOSIS — N189 Chronic kidney disease, unspecified: Secondary | ICD-10-CM

## 2013-05-12 DIAGNOSIS — I96 Gangrene, not elsewhere classified: Secondary | ICD-10-CM | POA: Diagnosis present

## 2013-05-12 DIAGNOSIS — F17200 Nicotine dependence, unspecified, uncomplicated: Secondary | ICD-10-CM

## 2013-05-12 DIAGNOSIS — R4182 Altered mental status, unspecified: Secondary | ICD-10-CM | POA: Diagnosis not present

## 2013-05-12 DIAGNOSIS — Z09 Encounter for follow-up examination after completed treatment for conditions other than malignant neoplasm: Secondary | ICD-10-CM

## 2013-05-12 DIAGNOSIS — G589 Mononeuropathy, unspecified: Secondary | ICD-10-CM | POA: Diagnosis present

## 2013-05-12 DIAGNOSIS — I4891 Unspecified atrial fibrillation: Secondary | ICD-10-CM

## 2013-05-12 DIAGNOSIS — Z794 Long term (current) use of insulin: Secondary | ICD-10-CM

## 2013-05-12 DIAGNOSIS — M9080 Osteopathy in diseases classified elsewhere, unspecified site: Secondary | ICD-10-CM

## 2013-05-12 DIAGNOSIS — I739 Peripheral vascular disease, unspecified: Secondary | ICD-10-CM

## 2013-05-12 LAB — CBC WITH DIFFERENTIAL/PLATELET
Basophils Absolute: 0 10*3/uL (ref 0.0–0.1)
Basophils Relative: 0 % (ref 0–1)
EOS ABS: 0 10*3/uL (ref 0.0–0.7)
Eosinophils Relative: 1 % (ref 0–5)
HCT: 49.9 % (ref 39.0–52.0)
HEMOGLOBIN: 16.6 g/dL (ref 13.0–17.0)
LYMPHS ABS: 0.3 10*3/uL — AB (ref 0.7–4.0)
Lymphocytes Relative: 6 % — ABNORMAL LOW (ref 12–46)
MCH: 31.1 pg (ref 26.0–34.0)
MCHC: 33.3 g/dL (ref 30.0–36.0)
MCV: 93.6 fL (ref 78.0–100.0)
MONOS PCT: 8 % (ref 3–12)
Monocytes Absolute: 0.4 10*3/uL (ref 0.1–1.0)
NEUTROS ABS: 4.3 10*3/uL (ref 1.7–7.7)
NEUTROS PCT: 85 % — AB (ref 43–77)
Platelets: 141 10*3/uL — ABNORMAL LOW (ref 150–400)
RBC: 5.33 MIL/uL (ref 4.22–5.81)
RDW: 16 % — ABNORMAL HIGH (ref 11.5–15.5)
WBC: 5.1 10*3/uL (ref 4.0–10.5)

## 2013-05-12 LAB — COMPREHENSIVE METABOLIC PANEL
ALBUMIN: 2 g/dL — AB (ref 3.5–5.2)
ALT: <5 U/L (ref 0–53)
AST: 9 U/L (ref 0–37)
Alkaline Phosphatase: 106 U/L (ref 39–117)
BUN: 18 mg/dL (ref 6–23)
CALCIUM: 8.5 mg/dL (ref 8.4–10.5)
CO2: 30 mEq/L (ref 19–32)
CREATININE: 3.61 mg/dL — AB (ref 0.50–1.35)
Chloride: 95 mEq/L — ABNORMAL LOW (ref 96–112)
GFR calc Af Amer: 18 mL/min — ABNORMAL LOW (ref >90)
GFR calc non Af Amer: 15 mL/min — ABNORMAL LOW (ref >90)
Glucose, Bld: 105 mg/dL — ABNORMAL HIGH (ref 70–99)
Potassium: 2.8 mEq/L — CL (ref 3.7–5.3)
Sodium: 139 mEq/L (ref 137–147)
TOTAL PROTEIN: 6 g/dL (ref 6.0–8.3)
Total Bilirubin: 0.4 mg/dL (ref 0.3–1.2)

## 2013-05-12 LAB — VANCOMYCIN, RANDOM

## 2013-05-12 LAB — PRO B NATRIURETIC PEPTIDE

## 2013-05-12 LAB — LIPASE, BLOOD: LIPASE: 23 U/L (ref 11–59)

## 2013-05-12 LAB — PROTIME-INR
INR: 2.39 — AB (ref 0.00–1.49)
PROTHROMBIN TIME: 25.3 s — AB (ref 11.6–15.2)

## 2013-05-12 LAB — GLUCOSE, CAPILLARY: GLUCOSE-CAPILLARY: 162 mg/dL — AB (ref 70–99)

## 2013-05-12 MED ORDER — VANCOMYCIN HCL 10 G IV SOLR
1500.0000 mg | Freq: Once | INTRAVENOUS | Status: AC
  Administered 2013-05-12: 1500 mg via INTRAVENOUS
  Filled 2013-05-12: qty 1500

## 2013-05-12 MED ORDER — ALUM & MAG HYDROXIDE-SIMETH 200-200-20 MG/5ML PO SUSP: 30.0000 mL | Freq: Four times a day (QID) | ORAL | Status: DC | PRN

## 2013-05-12 MED ORDER — INSULIN ASPART 100 UNIT/ML ~~LOC~~ SOLN
0.0000 [IU] | Freq: Every day | SUBCUTANEOUS | Status: DC
  Administered 2013-05-21: 2 [IU] via SUBCUTANEOUS

## 2013-05-12 MED ORDER — OXYCODONE HCL 5 MG PO TABS: 5.0000 mg | ORAL_TABLET | ORAL | Status: DC | PRN

## 2013-05-12 MED ORDER — HYDROCODONE-ACETAMINOPHEN 5-325 MG PO TABS: 1.0000 | ORAL_TABLET | Freq: Four times a day (QID) | ORAL | Status: DC | PRN

## 2013-05-12 MED ORDER — LIDOCAINE-PRILOCAINE 2.5-2.5 % EX CREA
1.0000 "application " | TOPICAL_CREAM | CUTANEOUS | Status: DC
  Filled 2013-05-12: qty 5

## 2013-05-12 MED ORDER — ACETAMINOPHEN 325 MG PO TABS: 650.0000 mg | ORAL_TABLET | Freq: Four times a day (QID) | ORAL | Status: DC | PRN

## 2013-05-12 MED ORDER — ESCITALOPRAM OXALATE 10 MG PO TABS
10.0000 mg | ORAL_TABLET | Freq: Every day | ORAL | Status: DC
  Administered 2013-05-14 – 2013-05-22 (×8): 10 mg via ORAL
  Filled 2013-05-12 (×11): qty 1

## 2013-05-12 MED ORDER — INSULIN ASPART 100 UNIT/ML ~~LOC~~ SOLN
0.0000 [IU] | Freq: Three times a day (TID) | SUBCUTANEOUS | Status: DC
  Administered 2013-05-13 (×2): 3 [IU] via SUBCUTANEOUS
  Administered 2013-05-13: 2 [IU] via SUBCUTANEOUS
  Administered 2013-05-14: 3 [IU] via SUBCUTANEOUS
  Administered 2013-05-15: 2 [IU] via SUBCUTANEOUS
  Administered 2013-05-16: 3 [IU] via SUBCUTANEOUS
  Administered 2013-05-16: 2 [IU] via SUBCUTANEOUS
  Administered 2013-05-17: 5 [IU] via SUBCUTANEOUS
  Administered 2013-05-17: 3 [IU] via SUBCUTANEOUS
  Administered 2013-05-17 – 2013-05-18 (×2): 2 [IU] via SUBCUTANEOUS
  Administered 2013-05-18: 3 [IU] via SUBCUTANEOUS
  Administered 2013-05-19 (×2): 2 [IU] via SUBCUTANEOUS
  Administered 2013-05-19: 3 [IU] via SUBCUTANEOUS
  Administered 2013-05-20: 8 [IU] via SUBCUTANEOUS
  Administered 2013-05-21: 3 [IU] via SUBCUTANEOUS
  Administered 2013-05-21: 8 [IU] via SUBCUTANEOUS
  Administered 2013-05-21: 2 [IU] via SUBCUTANEOUS

## 2013-05-12 MED ORDER — ACETAMINOPHEN 650 MG RE SUPP: 650.0000 mg | Freq: Four times a day (QID) | RECTAL | Status: DC | PRN

## 2013-05-12 MED ORDER — CALCIUM ACETATE 667 MG PO CAPS
2001.0000 mg | ORAL_CAPSULE | Freq: Three times a day (TID) | ORAL | Status: DC
  Administered 2013-05-13 – 2013-05-14 (×4): 2001 mg via ORAL
  Filled 2013-05-12 (×14): qty 3

## 2013-05-12 MED ORDER — PIPERACILLIN-TAZOBACTAM IN DEX 2-0.25 GM/50ML IV SOLN
2.2500 g | Freq: Three times a day (TID) | INTRAVENOUS | Status: DC
  Administered 2013-05-12 – 2013-05-22 (×27): 2.25 g via INTRAVENOUS
  Filled 2013-05-12 (×32): qty 50

## 2013-05-12 MED ORDER — ONDANSETRON HCL 4 MG/2ML IJ SOLN: 4.0000 mg | Freq: Four times a day (QID) | INTRAMUSCULAR | Status: DC | PRN

## 2013-05-12 MED ORDER — PRO-STAT SUGAR FREE PO LIQD
30.0000 mL | Freq: Two times a day (BID) | ORAL | Status: DC
  Administered 2013-05-13 – 2013-05-16 (×4): 30 mL via ORAL
  Filled 2013-05-12 (×11): qty 30

## 2013-05-12 MED ORDER — METOPROLOL TARTRATE 100 MG PO TABS
100.0000 mg | ORAL_TABLET | Freq: Two times a day (BID) | ORAL | Status: DC
  Administered 2013-05-12 – 2013-05-21 (×13): 100 mg via ORAL
  Filled 2013-05-12 (×8): qty 1
  Filled 2013-05-12: qty 2
  Filled 2013-05-12 (×11): qty 1

## 2013-05-12 MED ORDER — SODIUM CHLORIDE 0.9 % IV SOLN: 250.0000 mL | INTRAVENOUS | Status: DC | PRN

## 2013-05-12 MED ORDER — ONDANSETRON HCL 4 MG PO TABS: 4.0000 mg | ORAL_TABLET | Freq: Four times a day (QID) | ORAL | Status: DC | PRN

## 2013-05-12 MED ORDER — WARFARIN SODIUM 1 MG PO TABS
1.0000 mg | ORAL_TABLET | Freq: Once | ORAL | Status: AC
  Administered 2013-05-12: 1 mg via ORAL
  Filled 2013-05-12: qty 1

## 2013-05-12 MED ORDER — SODIUM CHLORIDE 0.9 % IV SOLN
INTRAVENOUS | Status: DC
Start: 2013-05-12 — End: 2013-05-12
  Administered 2013-05-12: 13:00:00 via INTRAVENOUS

## 2013-05-12 MED ORDER — SODIUM CHLORIDE 0.9 % IJ SOLN
3.0000 mL | Freq: Two times a day (BID) | INTRAMUSCULAR | Status: DC
  Administered 2013-05-12 – 2013-05-21 (×16): 3 mL via INTRAVENOUS

## 2013-05-12 MED ORDER — LEVOTHYROXINE SODIUM 25 MCG PO TABS
25.0000 ug | ORAL_TABLET | Freq: Every day | ORAL | Status: DC
  Administered 2013-05-13 – 2013-05-22 (×9): 25 ug via ORAL
  Filled 2013-05-12 (×11): qty 1

## 2013-05-12 MED ORDER — PIPERACILLIN-TAZOBACTAM IN DEX 2-0.25 GM/50ML IV SOLN: 2.2500 g | Freq: Four times a day (QID) | INTRAVENOUS | Status: DC

## 2013-05-12 MED ORDER — AMIODARONE HCL 100 MG PO TABS: 100.0000 mg | ORAL_TABLET | Freq: Two times a day (BID) | ORAL | Status: DC

## 2013-05-12 MED ORDER — POTASSIUM CHLORIDE 10 MEQ/100ML IV SOLN
10.0000 meq | Freq: Once | INTRAVENOUS | Status: AC
  Administered 2013-05-12: 10 meq via INTRAVENOUS
  Filled 2013-05-12: qty 100

## 2013-05-12 MED ORDER — SODIUM CHLORIDE 0.9 % IJ SOLN: 3.0000 mL | INTRAMUSCULAR | Status: DC | PRN

## 2013-05-12 MED ORDER — GABAPENTIN 300 MG PO CAPS
300.0000 mg | ORAL_CAPSULE | Freq: Every day | ORAL | Status: DC
  Administered 2013-05-12 – 2013-05-20 (×8): 300 mg via ORAL
  Filled 2013-05-12 (×11): qty 1

## 2013-05-12 MED ORDER — MORPHINE SULFATE 2 MG/ML IJ SOLN: 2.0000 mg | INTRAMUSCULAR | Status: DC | PRN

## 2013-05-12 MED ORDER — WARFARIN - PHARMACIST DOSING INPATIENT: Freq: Every day | Status: DC

## 2013-05-12 MED ORDER — AMIODARONE HCL 100 MG PO TABS
100.0000 mg | ORAL_TABLET | Freq: Two times a day (BID) | ORAL | Status: DC
  Administered 2013-05-12 – 2013-05-22 (×16): 100 mg via ORAL
  Filled 2013-05-12 (×21): qty 1

## 2013-05-12 MED ORDER — VANCOMYCIN HCL IN DEXTROSE 750-5 MG/150ML-% IV SOLN
750.0000 mg | INTRAVENOUS | Status: DC
  Administered 2013-05-13 – 2013-05-22 (×5): 750 mg via INTRAVENOUS
  Filled 2013-05-12 (×9): qty 150

## 2013-05-12 NOTE — Progress Notes (Signed)
Patient ID: Chris Vance, male   DOB: 1940-04-19, 73 y.o.   MRN: [id number] Facility; [facility] Chief complaint; rapidly deteriorating wound over the left fifth metatarsal head History; this is a patient who I admitted to the facility after undergoing a right BKA in early January. He is a type II diabetic on Lantus with known PAD and nephropathy on dialysis.  At the time of visit original admission here or shortly thereafter he developed a small wound over the plantar left fifth metatarsal head. Has had a very tiny opening but with roughly a centimeter of depth. It did not probe to bone.  When I reviewed him a week ago at which time the primary problem was an early delirium, I noted a sanguinous drainage coming out of this wound which I cultured. He was started empirically on Augmentin, and as I understand things has since been transitioned to vancomycin and Zosyn at dialysis although I don't have concrete information from them. Staff noted deterioration in the wound late last week. I am looking at this today in followup, he was seen by our service yesterday, and apparently reviewed at dialysis although I don't have any information on the latter.   I discontinued Pepcid and reduce the amiodarone dose to 100 twice a day last week as possible contributors to the altered mental status and anorexia.  My original culture from a week ago showed methicillin sensitive staph aureus, and group B strep.   Review of systems Respiratory; no complaints of cough or shortness of breath Cardiac no chest pain GI no abdominal pain or diarrhea Musculoskeletal; he is not complaining of pain in his left foot although I suspect he is neuropathic  Medication; these are reviewed and updated in [facility] link. As I understand things he is on IV vancomycin and? Zosyn at dialysis  Physical examination Gen. the patient actually looks a lot better than last week he is alert conversation Vitals ; Respiratory clear  entry bilaterally Cardiac; heart sounds are irregular, he appears to be euvolemic Abdomen soft and nontender Left foot; there is mention of a considerable deterioration in the status of his wound which originally was on the base of the left fifth metatarsal. This now has necrotic tissue extending around the side of his foot. There is erythema extending proximally.   Impression/plan #1 rapidly progressive deteriorating [place]ers 4 wound with spreading necrosis and infection around the side of his foot into the dorsal part of the fifth metatarsal phalangeal joint. At this point this is not a medical issue. He will need surgery. I am hoping that they foot salvage procedure can be entertained. I am going to send him to the emergency room for prompt surgical evaluation.

## 2013-05-12 NOTE — ED Provider Notes (Signed)
CSN: [id number]     Arrival date & time 05/12/13  1127 History   First MD Initiated Contact with Patient 05/12/13 1128     Chief Complaint  Patient presents with  . Foot Ulcer     (Consider location/radiation/quality/duration/timing/severity/associated sxs/prior Treatment) The history is provided by the patient. The history is limited by the condition of the patient.   patient sent in from nursing home [facility] for a left foot ulcer infection. 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 Patient is dialyzed Monday Wednesdays and Fridays was dialyzed yesterday best we can tell and was given Zosyn and Vanco during the dialysis. [facility] is followed by [facility]. Cultures were sent and does show a staph aureus and group B strep. Patient has no specific complaints. However level V caveat my apply for confusion.  Level V caveat applies to to the patient's confusion.  Past Medical History  Diagnosis Date  . Renal disorder   . Diabetes mellitus   . Hypertension   . Anemia   . Hyperlipidemia   . Thyroid disease   . Anxiety   . Skin cancer    Past Surgical History  Procedure Laterality Date  . Av fistula placement    . Amputation Right 02/13/2013    Procedure: Right Below Knee Amputation;  Surgeon: Newt Minion, MD;  Location: [place];  Service: Orthopedics;  Laterality: Right;  Right Below Knee Amputation   Family History  Problem Relation Age of Onset  . Asthma Father    History  Substance Use Topics  . Smoking status: Former Smoker -- 1.00 packs/day for 55 years    Types: Cigarettes    Start date: 02/26/1957  . Smokeless tobacco: Never Used     Comment: No cigarettes x 3-4 weeks.  . Alcohol Use: No    Review of Systems  Unable to perform ROS  level V caveat patient appears to have some confusion. However has no complaints.    Allergies  Ace inhibitors and Lisinopril  Home Medications    Current Outpatient Rx  Name  Route  Sig  Dispense  Refill  . acetaminophen (TYLENOL) 325 MG tablet   Oral   Take 650 mg by mouth every 6 (six) hours as needed for mild pain or moderate pain (or Fever >/= 101).         Marland Kitchen amiodarone (PACERONE) 100 MG tablet   Oral   Take 1 tablet (100 mg total) by mouth 2 (two) times daily.         Marland Kitchen amoxicillin-clavulanate (AUGMENTIN) 500-125 MG per tablet   Oral   Take 1 tablet by mouth 2 (two) times daily. For 14 days         . calcium acetate (PHOSLO) 667 MG capsule   Oral   Take 2,001 mg by mouth 3 (three) times daily with meals.         Marland Kitchen escitalopram (LEXAPRO) 10 MG tablet   Oral   Take 10 mg by mouth daily.         Marland Kitchen gabapentin (NEURONTIN) 300 MG capsule   Oral   Take 300 mg by mouth at bedtime.         Marland Kitchen HYDROcodone-acetaminophen (NORCO/VICODIN) 5-325 MG per tablet   Oral   Take 1 tablet by mouth every 6 (six) hours as needed for moderate pain.         Marland Kitchen insulin lispro (HUMALOG)  100 UNIT/ML injection   Subcutaneous   Inject 2-10 Units into the skin 4 (four) times daily -  before meals and at bedtime.         Marland Kitchen levothyroxine (SYNTHROID, LEVOTHROID) 25 MCG tablet   Oral   Take 25 mcg by mouth daily before breakfast.         . lidocaine-prilocaine (EMLA) cream   Topical   Apply 1 application topically 3 (three) times a week. Dialysis med         . metoprolol (LOPRESSOR) 100 MG tablet   Oral   Take 100 mg by mouth 2 (two) times daily.         Marland Kitchen warfarin (COUMADIN) 1 MG tablet   Oral   Take 1 mg by mouth daily.         . Amino Acids-Protein Hydrolys (FEEDING SUPPLEMENT, PRO-STAT SUGAR FREE 64,) LIQD   Oral   Take 30 mLs by mouth 2 (two) times daily.          BP 152/57  Pulse 80  Temp(Src) 98.2 F (36.8 C) (Oral)  Resp 22  SpO2 93% Physical Exam  Nursing note and vitals reviewed. Constitutional: He appears well-developed and well-nourished.  HENT:  Head: Normocephalic and atraumatic.   Mouth/Throat: Oropharynx is clear and moist.  Except for a 4 cm laceration to the 4 head scalp area that is scabbed over.  Eyes: Conjunctivae and EOM are normal. Pupils are equal, round, and reactive to light.  Neck: Normal range of motion.  Cardiovascular: Normal rate.   No murmur heard. Irregular rhythm  Pulmonary/Chest: Effort normal and breath sounds normal. No respiratory distress.  Abdominal: Soft. There is no tenderness.  Musculoskeletal: He exhibits edema.  Swelling and redness to the lateral aspect of the left foot with open wound at the metatarsal phalange junction.  Refill is about 2-3 seconds to the toes. No evidence of crepitance. Redness is not spreading up into the shin. All extremities with scattered bruising and abrasions all healing. Right leg with below the knee amputation stump that appears normal.  Neurological: He is alert. No cranial nerve deficit. He exhibits normal muscle tone. Coordination normal.  Skin: Skin is warm.  Scattered bruising and abrasions.    ED Course  Procedures (including critical care time) Labs Review Labs Reviewed  PRO B NATRIURETIC PEPTIDE - Abnormal; Notable for the following:    Pro B Natriuretic peptide (BNP) >70000.0 (*)    All other components within normal limits  COMPREHENSIVE METABOLIC PANEL - Abnormal; Notable for the following:    Potassium 2.8 (*)    Chloride 95 (*)    Glucose, Bld 105 (*)    Creatinine, Ser 3.61 (*)    Albumin 2.0 (*)    GFR calc non Af Amer 15 (*)    GFR calc Af Amer 18 (*)    All other components within normal limits  PROTIME-INR - Abnormal; Notable for the following:    Prothrombin Time 25.3 (*)    INR 2.39 (*)    All other components within normal limits  CBC WITH DIFFERENTIAL - Abnormal; Notable for the following:    RDW 16.0 (*)    Platelets 141 (*)    Neutrophils Relative % 85 (*)    Lymphocytes Relative 6 (*)    Lymphs Abs 0.3 (*)    All other components within normal limits  LIPASE,  BLOOD  URINALYSIS, ROUTINE W REFLEX MICROSCOPIC   Results for orders placed during the hospital encounter  of 05/12/13  PRO B NATRIURETIC PEPTIDE      Result Value Ref Range   Pro B Natriuretic peptide (BNP) >70000.0 (*) 0 - 125 pg/mL  COMPREHENSIVE METABOLIC PANEL      Result Value Ref Range   Sodium 139  137 - 147 mEq/L   Potassium 2.8 (*) 3.7 - 5.3 mEq/L   Chloride 95 (*) 96 - 112 mEq/L   CO2 30  19 - 32 mEq/L   Glucose, Bld 105 (*) 70 - 99 mg/dL   BUN 18  6 - 23 mg/dL   Creatinine, Ser 3.61 (*) 0.50 - 1.35 mg/dL   Calcium 8.5  8.4 - 10.5 mg/dL   Total Protein 6.0  6.0 - 8.3 g/dL   Albumin 2.0 (*) 3.5 - 5.2 g/dL   AST 9  0 - 37 U/L   ALT <5  0 - 53 U/L   Alkaline Phosphatase 106  39 - 117 U/L   Total Bilirubin 0.4  0.3 - 1.2 mg/dL   GFR calc non Af Amer 15 (*) >90 mL/min   GFR calc Af Amer 18 (*) >90 mL/min  LIPASE, BLOOD      Result Value Ref Range   Lipase 23  11 - 59 U/L  PROTIME-INR      Result Value Ref Range   Prothrombin Time 25.3 (*) 11.6 - 15.2 seconds   INR 2.39 (*) 0.00 - 1.49  CBC WITH DIFFERENTIAL      Result Value Ref Range   WBC 5.1  4.0 - 10.5 K/uL   RBC 5.33  4.22 - 5.81 MIL/uL   Hemoglobin 16.6  13.0 - 17.0 g/dL   HCT 49.9  39.0 - 52.0 %   MCV 93.6  78.0 - 100.0 fL   MCH 31.1  26.0 - 34.0 pg   MCHC 33.3  30.0 - 36.0 g/dL   RDW 16.0 (*) 11.5 - 15.5 %   Platelets 141 (*) 150 - 400 K/uL   Neutrophils Relative % 85 (*) 43 - 77 %   Neutro Abs 4.3  1.7 - 7.7 K/uL   Lymphocytes Relative 6 (*) 12 - 46 %   Lymphs Abs 0.3 (*) 0.7 - 4.0 K/uL   Monocytes Relative 8  3 - 12 %   Monocytes Absolute 0.4  0.1 - 1.0 K/uL   Eosinophils Relative 1  0 - 5 %   Eosinophils Absolute 0.0  0.0 - 0.7 K/uL   Basophils Relative 0  0 - 1 %   Basophils Absolute 0.0  0.0 - 0.1 K/uL    Imaging Review Dg Chest 2 View  05/12/2013   CLINICAL DATA:  Diabetes, hypertension, foot ulcer  EXAM: CHEST  2 VIEW  COMPARISON:  DG CHEST 1V PORT dated 04/20/2013; DG THORACIC SPINE dated  04/20/2013  FINDINGS: Moderate to severe cardiac enlargement with stable calcification of the aortic arch. Vascular congestion with mild diffuse interstitial prominence. Small bilateral pleural effusions.  IMPRESSION: Findings suggest congestive heart failure with mild interstitial pulmonary edema.   Electronically Signed   By: Skipper Cliche M.D.   On: 05/12/2013 13:51   Dg Foot Complete Left  05/12/2013   CLINICAL DATA:  Ulcer lateral fifth metacarpophalangeal joint  EXAM: LEFT FOOT - COMPLETE 3+ VIEW  COMPARISON:  MR FOOT*L* W/O CM dated 02/01/2013  FINDINGS: Diffuse small vessel calcification. 4 mm foreign body along the sole underlying the distal fourth metacarpal. Soft tissue defect laterally over the fifth metacarpophalangeal joint. No periosteal reaction or cortical  destruction. No fracture or dislocation.  IMPRESSION: No acute osseous abnormalities. Findings consistent with soft tissue ulcer with no definite evidence of osteomyelitis.  A foreign body is identified as described above.   Electronically Signed   By: Skipper Cliche M.D.   On: 05/12/2013 13:55     EKG Interpretation   Date/Time:  Tuesday May 12 2013 12:59:55 EDT Ventricular Rate:  69 PR Interval:    QRS Duration: 130 QT Interval:  547 QTC Calculation: 586 R Axis:   65 Text Interpretation:  Atrial flutter with predominant 3:1 AV block LVH  with secondary repolarization abnormality Prolonged QT interval No  significant change since Confirmed by Horst Ostermiller  MD, Matilyn Fehrman [phone]) on  05/12/2013 1:37:01 PM      MDM   Final diagnoses:  Hypokalemia  Pulmonary edema  Renal failure  Atrial fibrillation  Foot infection    Patient normally dialyzed on Mondays Wednesdays and Fridays. Center nursing home for cellulitis infection to left foot which is present. X-ray showed no osteomyelitis. Some question of a foreign body at church that's old or not. Patient also had a below the knee amputation of the right leg. Patient appears  nontoxic no acute distress. Has long-standing atrial fib rate is controlled. Patient supposedly given Zosyn and Vanco dialysis yesterday that should be still in his system. Have contacted the hospitalist for admission. They will discuss with nephrology about continuing dialysis.  Chest x-ray shows some mild pulmonary edema that can be corrected with bowels patient in no respiratory distress. Patient also afebrile not tachycardic.  Patient's potassium was low 2.8 but patient is a dialysis patient. We'll give 10 mEq of potassium IV just to protect the heart low bit. We'll not give any more. Have not started antibiotics because he got some yesterday.    Mervin Kung, MD 05/12/13 [phone]

## 2013-05-12 NOTE — ED Notes (Signed)
Restricted extremity bracelet placed on pt Rt arm.

## 2013-05-12 NOTE — Progress Notes (Signed)
ANTICOAGULATION CONSULT NOTE - Initial Consult  Pharmacy Consult for warfarin Indication: atrial fibrillation  Allergies  Allergen Reactions  . Ace Inhibitors Other (See Comments)    Unknown   . Lisinopril Cough    Patient Measurements: Height: 5' 10.08" (178 cm) Weight: 160 lb 0.9 oz (72.6 kg) IBW/kg (Calculated) : 73.18  Vital Signs: Temp: 98.2 F (36.8 C) (03/17 1145) Temp src: Oral (03/17 1145) BP: 144/57 mmHg (03/17 1600) Pulse Rate: 81 (03/17 1600)  Labs:  Recent Labs  05/12/13 1311  HGB 16.6  HCT 49.9  PLT 141*  LABPROT 25.3*  INR 2.39*  CREATININE 3.61*    Estimated Creatinine Clearance: 19 ml/min (by C-G formula based on Cr of 3.61).   Medical History: Past Medical History  Diagnosis Date  . Renal disorder   . Diabetes mellitus   . Hypertension   . Anemia   . Hyperlipidemia   . Thyroid disease   . Anxiety   . Skin cancer     Medications:  Infusions:  . piperacillin-tazobactam (ZOSYN)  IV      Assessment: 73 yo M with DM and ESRD/HD MWF presents from nursing home for further evaluation of left foot ulcer.  Pharmacy consulted to continue PTA warfarin.  Nursing Home Medication Administration Record reports patient takes 1mg  daily.  I spoke with [facility], and they confirmed patient has not received dose today (they give all warfarin doses at 1700).  Baseline INR is 2.39.  H/H is wnl and initial Plt's are 141. No bleeding issues reported.  Drug-Drug Interactions: Patient has been receiving antibiotics outpatient (recent Augmentin, and noted Zosyn/Vanc at HD), as well as amiodarone PTA.  He is continuing on vancomycin and Zosyn inpatient.  Will monitor.  Goal of Therapy:  INR 2-3 Monitor platelets by anticoagulation protocol: Yes   Plan:  - give warfarin PO 1mg  x1 dose tonight - daily INR - monitor for s/s of bleeding  Ovid Curd E. Jacqlyn Larsen, PharmD Clinical Pharmacist - Resident Pager: [phone] Pharmacy:  [phone] 05/12/2013 5:25 PM  ]

## 2013-05-12 NOTE — ED Notes (Signed)
RN not ready for report will call down when available.

## 2013-05-12 NOTE — ED Notes (Signed)
MD at bedside. Internal Med 

## 2013-05-12 NOTE — Progress Notes (Signed)
Patient ID: Chris Vance, male   DOB: 1940/04/09, 73 y.o.   MRN: [id number] Chris Vance is a 73 yo male well-known to Ortho and my partner Dr. Sharol Given.  He had a right BKA this past December due to PVD and chronic/ischemic wounds.  He now presents to the ER from his PCP clinic with a left foot ulcer that is draining and has associated cellulitis.  He said that he has been trying to take care of his foot and that the wound worsened over the past few days.  On exam of his left foot, there is an area of necrosis over the 5th ray with purulent drainage and soft-tissue necrosis.  [facility] is being [facility] and being started on IV antibiotics.  He will likely need an I&D of that left foot and possible a 5th ray resection.  I'll have Dr. Sharol Given come by and see him tomorrow.

## 2013-05-12 NOTE — ED Notes (Signed)
Dr. Venita Sheffield notified of critical Potassium.

## 2013-05-12 NOTE — ED Notes (Signed)
73 yo male arrived via [facility] from [facility] with c/o Left Foot ulcer of the Left Fifth Metatarsal Head. Hx Diabetes, A.FIB, Rt BKA and Dialysis. Currently the foot is wrapped in gauze and has no pain.  Pt was taking Augmentin, Zosyn and Vancomycin. Per Dr. Dellia Nims MD at for [facility], cultures were sent showing sightns of Methicillin sensitve staph aureus and group B strep. Pt is A/O.

## 2013-05-12 NOTE — Progress Notes (Signed)
Called to receive report from ED nurse but again no answer. Instructed Financial controller to call my phone when nurse calls back.

## 2013-05-12 NOTE — ED Notes (Signed)
Attempted to call for report. RN needs 32mins

## 2013-05-12 NOTE — Progress Notes (Signed)
Attempted to call number provided to receive report but did not get an answer; will try again in 6min.

## 2013-05-12 NOTE — Progress Notes (Signed)
ANTIBIOTIC CONSULT NOTE - FOLLOW UP  Pharmacy Consult for Vancomycin and Zosyn Indication: osteomyelitis  Allergies  Allergen Reactions  . Ace Inhibitors Other (See Comments)    Unknown   . Lisinopril Cough    Patient Measurements: Height: 5' 10.08" (178 cm) Weight: 149 lb 0.5 oz (67.6 kg) IBW/kg (Calculated) : 73.18  Vital Signs: Temp: 98.4 F (36.9 C) (03/17 1759) Temp src: Oral (03/17 1759) BP: 127/64 mmHg (03/17 1759) Pulse Rate: 82 (03/17 1759) Intake/Output from previous day:   Intake/Output from this shift:    Labs:  Recent Labs  05/12/13 1311  WBC 5.1  HGB 16.6  PLT 141*  CREATININE 3.61*   Estimated Creatinine Clearance: 17.7 ml/min (by C-G formula based on Cr of 3.61).  Recent Labs  05/12/13 1945  VANCORANDOM <5.0     Microbiology: Recent Results (from the past 720 hour(s))  CULTURE, BLOOD (ROUTINE X 2)     Status: None   Collection Time    04/20/13  7:07 PM      Result Value Ref Range Status   Specimen Description BLOOD LEFT UPPER ARM   Final   Special Requests     Final   Value: BOTTLES DRAWN AEROBIC AND ANAEROBIC RED 8CC BLUE 10CC   Culture  Setup Time     Final   Value: 04/21/2013 00:58     Performed at [facility]   Culture     Final   Value: NO GROWTH 5 DAYS     Performed at [facility]   Report Status 04/27/2013 FINAL   Final  CULTURE, BLOOD (ROUTINE X 2)     Status: None   Collection Time    04/20/13  7:20 PM      Result Value Ref Range Status   Specimen Description BLOOD LEFT ARM   Final   Special Requests     Final   Value: BOTTLES DRAWN AEROBIC AND ANAEROBIC 10 CC EACH BOTTLE   Culture  Setup Time     Final   Value: 04/21/2013 00:58     Performed at [facility]   Culture     Final   Value: NO GROWTH 5 DAYS     Performed at [facility]   Report Status 04/27/2013 FINAL   Final  MRSA PCR SCREENING     Status: None   Collection Time    04/20/13  8:15 PM      Result Value Ref Range  Status   MRSA by PCR NEGATIVE  NEGATIVE Final   Comment:            The GeneXpert MRSA Assay (FDA     approved for NASAL specimens     only), is one component of a     comprehensive MRSA colonization     surveillance program. It is not     intended to diagnose MRSA     infection nor to guide or     monitor treatment for     MRSA infections.    Anti-infectives   Start     Dose/Rate Route Frequency Ordered Stop   05/12/13 1800  piperacillin-tazobactam (ZOSYN) IVPB 2.25 g  Status:  Discontinued     2.25 g 100 mL/hr over 30 Minutes Intravenous 4 times per day 05/12/13 1747 05/12/13 1750   05/12/13 1730  piperacillin-tazobactam (ZOSYN) IVPB 2.25 g     2.25 g 100 mL/hr over 30 Minutes Intravenous Every 8 hours 05/12/13 1711  Assessment: 73 year old male admitted with osteomyelitis.  Per report, he was receiving Vancomycin with hemodialysis as an outpatient.  His Vancomycin random level is undetectable.  He will require a loading dose of Vancomycin.  Goal of Therapy:  preHD Vancomycin level 15-25 mcg/ml  Plan:  Vancomycin 1500mg  IV x 1 loading dose Vancomycin 750mg  IV after each HD session Zosyn 2.25gm IV q8h Follow for HD plans and tolerance Follow available micro data  Legrand Como, Pharm.D., BCPS, AAHIVP Clinical Pharmacist Phone: [phone] or [phone] 05/12/2013, 9:11 PM

## 2013-05-12 NOTE — H&P (Signed)
[facility] History and Physical  Chris Vance [id number] DOB: 14-Jul-1940 DOA: 05/12/2013  Referring physician:  PCP: Tamsen Roers, MD   Chief Complaint: Left foot ulcer   HPI: Chris Vance is a 73 y.o. 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 Sharol Given of orthopedic surgery. He is presently a nursing home resident, transferred to the emergent apartment at [facility] for further evaluation of a left foot ulcer. It appears that small wound over the plantar left fifth metatarsal head was noted in January of 2015. patient was assessed about a week ago, noted to have sanguinous drainage from his wound. He was initially started on Augmentin therapy, then transitioned to vancomycin and Zosyn at dialysis. Cultures from skilled nursing facility growing methicillin stat sensitive Staphylococcus aureus and group B strep. Over the past week there has been rapid progressive deterioration of left foot ulcer.an x-ray of the foot performed in the emergency room showed no acute osseous abnormalities, findings consistent with a soft tissue ulcer no definite evidence of osteomyelitis. I have consulted Dr. Ninfa Linden of orthopedic surgery and have ordered an MRI of his left foot. with his history of end-stage renal disease I also spoke with Dr. Joelyn Oms regarding patient's need for hemodialysis in the inpatient setting. Patient is a poor historian, however, he does not report fevers, chills, shortness of breath, chest pain, abdominal pain, nausea, vomiting.                                                                                                                                                                 Review of Systems:  Constitutional:  No weight loss, night sweats, Fevers,  chills, fatigue.  HEENT:  No headaches, Difficulty swallowing,Tooth/dental problems,Sore throat,  No sneezing, itching, ear ache, nasal congestion, post nasal drip,  Cardio-vascular:  No chest pain, Orthopnea, PND, swelling in lower extremities, anasarca, dizziness, palpitations  GI:  No heartburn, indigestion, abdominal pain, nausea, vomiting, diarrhea, change in bowel habits, loss of appetite  Resp:  No shortness of breath with exertion or at rest. No excess mucus, no productive cough, No non-productive cough, No coughing up of blood.No change in color of mucus.No wheezing.No chest wall deformity  Skin:  no rash or lesions.  GU:  no dysuria, change in color of urine, no urgency or frequency. No flank pain.  Musculoskeletal:  Patient complaining of pain over his left foot, with associated swelling Psych:  No change in mood or affect. No depression or anxiety. No memory loss.   Past Medical History  Diagnosis Date  . Renal disorder   . Diabetes mellitus   .  Hypertension   . Anemia   . Hyperlipidemia   . Thyroid disease   . Anxiety   . Skin cancer    Past Surgical History  Procedure Laterality Date  . Av fistula placement    . Amputation Right 02/13/2013    Procedure: Right Below Knee Amputation;  Surgeon: Newt Minion, MD;  Location: [facility];  Service: Orthopedics;  Laterality: Right;  Right Below Knee Amputation   Social History:  reports that he has quit smoking. His smoking use included Cigarettes. He started smoking about 56 years ago. He has a 55 pack-year smoking history. He has never used smokeless tobacco. He reports that he does not drink alcohol or use illicit drugs.  Allergies  Allergen Reactions  . Ace Inhibitors Other (See Comments)    Unknown   . Lisinopril Cough    Family History  Problem Relation Age of Onset  . Asthma Father      Prior to Admission medications   Medication Sig Start Date End Date Taking? Authorizing Provider  acetaminophen (TYLENOL)  325 MG tablet Take 650 mg by mouth every 6 (six) hours as needed for mild pain or moderate pain (or Fever >/= 101). 02/16/13  Yes Marrion Coy, MD  amiodarone (PACERONE) 100 MG tablet Take 1 tablet (100 mg total) by mouth 2 (two) times daily. 05/12/13  Yes Ricard Dillon, MD  amoxicillin-clavulanate (AUGMENTIN) 500-125 MG per tablet Take 1 tablet by mouth 2 (two) times daily. For 14 days   Yes Historical Provider, MD  calcium acetate (PHOSLO) 667 MG capsule Take 2,001 mg by mouth 3 (three) times daily with meals.   Yes Historical Provider, MD  escitalopram (LEXAPRO) 10 MG tablet Take 10 mg by mouth daily.   Yes Historical Provider, MD  gabapentin (NEURONTIN) 300 MG capsule Take 300 mg by mouth at bedtime.   Yes Historical Provider, MD  HYDROcodone-acetaminophen (NORCO/VICODIN) 5-325 MG per tablet Take 1 tablet by mouth every 6 (six) hours as needed for moderate pain. 02/17/13  Yes Estill Dooms, MD  insulin lispro (HUMALOG) 100 UNIT/ML injection Inject 2-10 Units into the skin 4 (four) times daily -  before meals and at bedtime.   Yes Historical Provider, MD  levothyroxine (SYNTHROID, LEVOTHROID) 25 MCG tablet Take 25 mcg by mouth daily before breakfast.   Yes Historical Provider, MD  lidocaine-prilocaine (EMLA) cream Apply 1 application topically 3 (three) times a week. Dialysis med   Yes Historical Provider, MD  metoprolol (LOPRESSOR) 100 MG tablet Take 100 mg by mouth 2 (two) times daily. 02/16/13  Yes Marrion Coy, MD  warfarin (COUMADIN) 1 MG tablet Take 1 mg by mouth daily.   Yes Historical Provider, MD  Amino Acids-Protein Hydrolys (FEEDING SUPPLEMENT, PRO-STAT SUGAR FREE 64,) LIQD Take 30 mLs by mouth 2 (two) times daily.    Historical Provider, MD   Physical Exam: Filed Vitals:   05/12/13 1600  BP: 144/57  Pulse: 81  Temp:   Resp: 17    BP 144/57  Pulse 81  Temp(Src) 98.2 F (36.8 C) (Oral)  Resp 17  SpO2 96%  General:  Appears calm and comfortable, he does  not appear to be in acute distress. Poor historian Eyes: PERRL, normal lids, irises & conjunctiva ENT: grossly normal hearing, lips & tongue Neck: no LAD, masses or thyromegaly Cardiovascular: RRR, no m/r/g. No LE edema. Telemetry: SR, no arrhythmias  Respiratory: CTA bilaterally, no w/r/r. Normal respiratory effort. Abdomen: soft, ntnd Skin: Patient having ulcerations involving plantar aspect  of left fifth metatarsal, with evidence of necrotic tissue, associated erythema involving foot, no purulence was expressed. Musculoskeletal: Status post right below the knee amputation Psychiatric: grossly normal mood and affect, speech fluent and appropriate Neurologic: grossly non-focal.          Labs on Admission:  Basic Metabolic Panel:  Recent Labs Lab 05/12/13 1311  NA 139  K 2.8*  CL 95*  CO2 30  GLUCOSE 105*  BUN 18  CREATININE 3.61*  CALCIUM 8.5   Liver Function Tests:  Recent Labs Lab 05/12/13 1311  AST 9  ALT <5  ALKPHOS 106  BILITOT 0.4  PROT 6.0  ALBUMIN 2.0*    Recent Labs Lab 05/12/13 1311  LIPASE 23   No results found for this basename: AMMONIA,  in the last 168 hours CBC:  Recent Labs Lab 05/12/13 1311  WBC 5.1  NEUTROABS 4.3  HGB 16.6  HCT 49.9  MCV 93.6  PLT 141*   Cardiac Enzymes: No results found for this basename: CKTOTAL, CKMB, CKMBINDEX, TROPONINI,  in the last 168 hours  BNP (last 3 results)  Recent Labs  05/12/13 1311  PROBNP >70000.0*   CBG: No results found for this basename: GLUCAP,  in the last 168 hours  Radiological Exams on Admission: Dg Chest 2 View  05/12/2013   CLINICAL DATA:  Diabetes, hypertension, foot ulcer  EXAM: CHEST  2 VIEW  COMPARISON:  DG CHEST 1V PORT dated 04/20/2013; DG THORACIC SPINE dated 04/20/2013  FINDINGS: Moderate to severe cardiac enlargement with stable calcification of the aortic arch. Vascular congestion with mild diffuse interstitial prominence. Small bilateral pleural effusions.  IMPRESSION:  Findings suggest congestive heart failure with mild interstitial pulmonary edema.   Electronically Signed   By: Skipper Cliche M.D.   On: 05/12/2013 13:51   Dg Foot Complete Left  05/12/2013   CLINICAL DATA:  Ulcer lateral fifth metacarpophalangeal joint  EXAM: LEFT FOOT - COMPLETE 3+ VIEW  COMPARISON:  MR FOOT*L* W/O CM dated 02/01/2013  FINDINGS: Diffuse small vessel calcification. 4 mm foreign body along the sole underlying the distal fourth metacarpal. Soft tissue defect laterally over the fifth metacarpophalangeal joint. No periosteal reaction or cortical destruction. No fracture or dislocation.  IMPRESSION: No acute osseous abnormalities. Findings consistent with soft tissue ulcer with no definite evidence of osteomyelitis.  A foreign body is identified as described above.   Electronically Signed   By: Skipper Cliche M.D.   On: 05/12/2013 13:55    EKG: Independently reviewed.   Assessment/Plan Principal Problem:   Diabetic ulcer of left foot Active Problems:   End stage renal disease   Peripheral vascular disease   Hx of right BKA   Atrial fibrillation   Diastolic CHF, chronic   Cellulitis   1. Suspected osteomyelitis of the fifth metatarsal. Patient presenting with significant progression of diaphragmatic foot ulcer involving left foot. Over the past week staff at skilled nursing facility noticing worsening necrosis, erythema and swelling. Although initial x-ray did not reveal evidence of osteomyelitis, or further workup with MRI of foot without contrast. I have consulted Dr. Ninfa Linden of orthopedic surgery. Will continue broad-spectrum IV antibiotic therapy with Zosyn and vancomycin. Obtain blood cultures. It appears cultures drawn at skilled nursing facility growing methicillin sensitive Staphylococcus aureus and group B strep. Await further recommendations from orthopedic surgery.  2. End-stage renal disease. Patient's with history of end-stage renal disease on hemodialysis Mondays  Wednesdays and Fridays, notified Dr. Joelyn Oms of patient's hospitalization. 3. Type 2 diabetes mellitus,  insulin-dependent, will perform Accu-Cheks every 6 each bedtime with sliding scale coverage. 4. Hypertension. Continue metoprolol 100 mg by mouth twice a day 5. History of atrial fibrillation, continue beta blocker therapy, amiodarone and anticoagulation. Lab work showed a therapeutic INR of 2.39, will will consult pharmacy for Coumadin management. 6. Chronic diastolic congestive heart failure. Patient appears stable, in no acute respiratory distress, satting 98% on room air. 7. DVT prophylaxis. Patient is anticoagulated    Code Status: Full Code Family Communication: Family not present Disposition Plan: I anticipate he will require greater than 2 nights hospitalization  Time spent: 21 min  Kelvin Cellar [facility] Pager [phone]

## 2013-05-13 ENCOUNTER — Other Ambulatory Visit (HOSPITAL_COMMUNITY): Payer: Self-pay | Admitting: Orthopedic Surgery

## 2013-05-13 DIAGNOSIS — L0390 Cellulitis, unspecified: Secondary | ICD-10-CM

## 2013-05-13 DIAGNOSIS — L0291 Cutaneous abscess, unspecified: Secondary | ICD-10-CM

## 2013-05-13 DIAGNOSIS — I4891 Unspecified atrial fibrillation: Secondary | ICD-10-CM

## 2013-05-13 LAB — PROTIME-INR
INR: 1.59 — ABNORMAL HIGH (ref 0.00–1.49)
INR: 1.39 (ref 0.00–1.49)
Prothrombin Time: 18.5 seconds — ABNORMAL HIGH (ref 11.6–15.2)
Prothrombin Time: 16.7 seconds — ABNORMAL HIGH (ref 11.6–15.2)

## 2013-05-13 LAB — BASIC METABOLIC PANEL
BUN: 25 mg/dL — ABNORMAL HIGH (ref 6–23)
CO2: 27 mEq/L (ref 19–32)
Calcium: 8.6 mg/dL (ref 8.4–10.5)
Chloride: 94 mEq/L — ABNORMAL LOW (ref 96–112)
Creatinine, Ser: 4.36 mg/dL — ABNORMAL HIGH (ref 0.50–1.35)
GFR calc Af Amer: 14 mL/min — ABNORMAL LOW (ref >90)
GFR calc non Af Amer: 12 mL/min — ABNORMAL LOW (ref >90)
Glucose, Bld: 137 mg/dL — ABNORMAL HIGH (ref 70–99)
Potassium: 3.2 mEq/L — ABNORMAL LOW (ref 3.7–5.3)
Sodium: 137 mEq/L (ref 137–147)

## 2013-05-13 LAB — CBC
HCT: 29.6 % — ABNORMAL LOW (ref 39.0–52.0)
HEMOGLOBIN: 9.6 g/dL — AB (ref 13.0–17.0)
MCH: 30.1 pg (ref 26.0–34.0)
MCHC: 32.4 g/dL (ref 30.0–36.0)
MCV: 92.8 fL (ref 78.0–100.0)
Platelets: 318 10*3/uL (ref 150–400)
RBC: 3.19 MIL/uL — AB (ref 4.22–5.81)
RDW: 15.9 % — ABNORMAL HIGH (ref 11.5–15.5)
WBC: 11.7 10*3/uL — AB (ref 4.0–10.5)

## 2013-05-13 LAB — GLUCOSE, CAPILLARY
GLUCOSE-CAPILLARY: 138 mg/dL — AB (ref 70–99)
Glucose-Capillary: 162 mg/dL — ABNORMAL HIGH (ref 70–99)
Glucose-Capillary: 134 mg/dL — ABNORMAL HIGH (ref 70–99)
Glucose-Capillary: 130 mg/dL — ABNORMAL HIGH (ref 70–99)

## 2013-05-13 MED ORDER — NEPRO/CARBSTEADY PO LIQD
237.0000 mL | ORAL | Status: DC
  Administered 2013-05-13 – 2013-05-18 (×6): 237 mL via ORAL

## 2013-05-13 MED ORDER — CHLORHEXIDINE GLUCONATE 4 % EX LIQD
60.0000 mL | Freq: Once | CUTANEOUS | Status: AC
  Administered 2013-05-15: 4 via TOPICAL
  Filled 2013-05-13 (×2): qty 60

## 2013-05-13 MED ORDER — CEFAZOLIN SODIUM-DEXTROSE 2-3 GM-% IV SOLR
2.0000 g | INTRAVENOUS | Status: DC
  Filled 2013-05-13: qty 50

## 2013-05-13 MED ORDER — DARBEPOETIN ALFA-POLYSORBATE 100 MCG/0.5ML IJ SOLN
INTRAMUSCULAR | Status: AC
  Administered 2013-05-13: 100 ug via INTRAVENOUS
  Filled 2013-05-13: qty 0.5

## 2013-05-13 MED ORDER — DARBEPOETIN ALFA-POLYSORBATE 100 MCG/0.5ML IJ SOLN
100.0000 ug | INTRAMUSCULAR | Status: DC
  Administered 2013-05-13 – 2013-05-20 (×2): 100 ug via INTRAVENOUS
  Filled 2013-05-13 (×2): qty 0.5

## 2013-05-13 MED ORDER — POTASSIUM CHLORIDE CRYS ER 20 MEQ PO TBCR: 40.0000 meq | EXTENDED_RELEASE_TABLET | ORAL | Status: AC

## 2013-05-13 MED ORDER — ENOXAPARIN SODIUM 30 MG/0.3ML ~~LOC~~ SOLN
30.0000 mg | SUBCUTANEOUS | Status: DC
  Administered 2013-05-13 – 2013-05-20 (×7): 30 mg via SUBCUTANEOUS
  Filled 2013-05-13 (×9): qty 0.3

## 2013-05-13 NOTE — Clinical Documentation Improvement (Signed)
Possible Clinical Conditions? Severe Malnutrition   Protein Calorie Malnutrition Severe Protein Calorie Malnutrition Other Condition Cannot clinically determine  Supporting Information: NUTRITION ASSESSMENT by Erlene Quan, RD at 05/13/2013 10:28 AM Risk Factors:Admitting Dx: Diabetic ulcer of left foot, PMHx significant for DM, ESRD on HD MWF, osteomyelitis/gangrene R ankle s/p BKA. From SNF. Admitted with L foot ulcer, pt has had increased drainage x 1 week. Work-up reveals diabetic gangrenous ulcer of L fifth metatarsal. Signs & Symptoms: Diagnostics:DOCUMENTATION CODES Per approved criteria  -Non-severe (modere) malnutrition in the context of chronic illness at INTERVENTION: Add renal MVI daily. Discussed with Ernest Haber PA-C. Continue 30 ml Prostat po BID. Add Nepro Shake po daily, each supplement provides 425 kcal and 19 grams protein. RD to continue to follow nutrition care plan.  Thank You, Alessandra Grout, RN, BSN, CCDS, Clinical Documentation Specialist:  [phone]   Cell=[phone] [place]- Health Information Management

## 2013-05-13 NOTE — Progress Notes (Signed)
INITIAL NUTRITION ASSESSMENT  DOCUMENTATION CODES Per approved criteria  -Non-severe (moderate) malnutrition in the context of chronic illness   INTERVENTION: Add renal MVI daily. Discussed with Ernest Haber PA-C. Continue 30 ml Prostat po BID. Add Nepro Shake po daily, each supplement provides 425 kcal and 19 grams protein. RD to continue to follow nutrition care plan.  NUTRITION DIAGNOSIS: Increased nutrient needs related to HD and wound healing as evidenced by estimated needs.   Goal: Intake to meet >90% of estimated nutrition needs.  Monitor:  weight trends, lab trends, I/O's, PO intake, supplement tolerance  Reason for Assessment: Malnutrition Screening Tool  73 y.o. male  Admitting Dx: Diabetic ulcer of left foot  ASSESSMENT: PMHx significant for DM, ESRD on HD MWF, osteomyelitis/gangrene R ankle s/p BKA. From SNF. Admitted with L foot ulcer, pt has had increased drainage x 1 week. Work-up reveals diabetic gangrenous ulcer of L fifth metatarsal.  Ortho has evaluated pt and recommends fifth ray L amputation to attempt salvage of L foot. This is planned for Friday, 3/20.   Per SNF MAR, pt receives 30 ml Prostat po BID and requires assistance with meals. Continues with this supplement at this time. Pt unable to state how much he has consumed this morning. Pt appears thin. He tells me that they over-restrict his fluid intake at his facility.  Nutrition Focused Physical Exam:  Subcutaneous Fat:  Orbital Region: WNL Upper Arm Region: moderate depletion Thoracic and Lumbar Region: WNL  Muscle:  Temple Region: WNL Clavicle Bone Region: severe depletion Clavicle and Acromion Bone Region: severe depletion Scapular Bone Region: n/a Dorsal Hand: severe depletion Patellar Region: severe depletion Anterior Thigh Region: severe depletion Posterior Calf Region: severe depletion  Edema: none  Pt meets criteria for moderate MALNUTRITION in the context of chronic illness as  evidenced by moderate fat mass loss and severe muscle mass loss.  Potassium is low at 3.2.  Height: Ht Readings from Last 1 Encounters:  05/12/13 5' 10.08" (1.78 m)    Weight: Wt Readings from Last 1 Encounters:  05/12/13 152 lb 1.9 oz (69 kg)    Ideal Body Weight: 155 lb (adjusted for BKA)  % Ideal Body Weight: 98%  Wt Readings from Last 15 Encounters:  05/12/13 152 lb 1.9 oz (69 kg)  05/11/13 160 lb (72.576 kg)  04/21/13 158 lb 1.1 oz (71.7 kg)  02/17/13 156 lb 12 oz (71.1 kg)  02/17/13 156 lb 12 oz (71.1 kg)  02/17/13 156 lb 12 oz (71.1 kg)  02/17/13 156 lb 12 oz (71.1 kg)  12/09/12 183 lb 4.8 oz (83.144 kg)  06/05/12 175 lb 9.6 oz (79.652 kg)  BKA in 02/13/13  Usual Body Weight: 156 - 160 lb  % Usual Body Weight: 96%  BMI:  23.3 (adjusted for BKA) ---  WNL  Estimated Nutritional Needs: Kcal: 1900 - 2100 Protein: at least 86 grams Fluid: 1.2 liters daily  Skin: L foot ulcers  Diet Order: Diabetic and Renal with 1200 ml fluid  EDUCATION NEEDS: -No education needs identified at this time   Intake/Output Summary (Last 24 hours) at 05/13/13 1028 Last data filed at 05/13/13 0600  Gross per 24 hour  Intake    100 ml  Output      0 ml  Net    100 ml    Last BM: 3/17  Labs:   Recent Labs Lab 05/12/13 1311 05/13/13 0534  NA 139 137  K 2.8* 3.2*  CL 95* 94*  CO2 30 27  BUN 18 25*  CREATININE 3.61* 4.36*  CALCIUM 8.5 8.6  GLUCOSE 105* 137*    CBG (last 3)   Recent Labs  05/12/13 2210 05/13/13 0757  GLUCAP 162* 138*    Scheduled Meds: . amiodarone  100 mg Oral BID  . calcium acetate  2,001 mg Oral TID WC  . [START ON 05/15/2013]  ceFAZolin (ANCEF) IV  2 g Intravenous On Call to OR  . [START ON 05/15/2013] chlorhexidine  60 mL Topical Once  . enoxaparin (LOVENOX) injection  30 mg Subcutaneous Q24H  . escitalopram  10 mg Oral Daily  . feeding supplement (PRO-STAT SUGAR FREE 64)  30 mL Oral BID  . gabapentin  300 mg Oral QHS  . insulin  aspart  0-15 Units Subcutaneous TID WC  . insulin aspart  0-5 Units Subcutaneous QHS  . levothyroxine  25 mcg Oral QAC breakfast  . lidocaine-prilocaine  1 application Topical Once per day on Mon Wed Fri  . metoprolol  100 mg Oral BID  . piperacillin-tazobactam (ZOSYN)  IV  2.25 g Intravenous Q8H  . sodium chloride  3 mL Intravenous Q12H  . vancomycin  750 mg Intravenous Q M,W,F-HD  . Warfarin - Pharmacist Dosing Inpatient   Does not apply q1800    Continuous Infusions:   Past Medical History  Diagnosis Date  . Renal disorder   . Diabetes mellitus   . Hypertension   . Anemia   . Hyperlipidemia   . Thyroid disease   . Anxiety   . Skin cancer     Past Surgical History  Procedure Laterality Date  . Av fistula placement    . Amputation Right 02/13/2013    Procedure: Right Below Knee Amputation;  Surgeon: Newt Minion, MD;  Location: [facility];  Service: Orthopedics;  Laterality: Right;  Right Below Knee Amputation    Inda Coke MS, RD, LDN Inpatient Registered Dietitian Pager: [phone] After-hours pager: [phone]

## 2013-05-13 NOTE — Progress Notes (Signed)
Pt in dialysis. Pt alert with some confusion noted.

## 2013-05-13 NOTE — Procedures (Signed)
I was present at this dialysis session. I have reviewed the session itself and made appropriate changes.   Pearson Grippe  MD 05/13/2013, 4:50 PM

## 2013-05-13 NOTE — Consult Note (Signed)
Reason for Consult: Gangrenous ulcer left foot fifth metatarsal head Referring Physician: Dr Shawnee Knapp is an 73 y.o. male.  HPI: Patient is a 73 year old gentleman with diabetic insensate neuropathy end-stage renal disease on dialysis who is status post a right transtibial amputation who presents at this time with ischemic gangrenous ulcer of the left foot fifth metatarsal head.  Past Medical History  Diagnosis Date  . Renal disorder   . Diabetes mellitus   . Hypertension   . Anemia   . Hyperlipidemia   . Thyroid disease   . Anxiety   . Skin cancer     Past Surgical History  Procedure Laterality Date  . Av fistula placement    . Amputation Right 02/13/2013    Procedure: Right Below Knee Amputation;  Surgeon: Newt Minion, MD;  Location: [facility];  Service: Orthopedics;  Laterality: Right;  Right Below Knee Amputation    Family History  Problem Relation Age of Onset  . Asthma Father     Social History:  reports that he has quit smoking. His smoking use included Cigarettes. He started smoking about 56 years ago. He has a 55 pack-year smoking history. He has never used smokeless tobacco. He reports that he does not drink alcohol or use illicit drugs.  Allergies:  Allergies  Allergen Reactions  . Ace Inhibitors Other (See Comments)    Unknown   . Lisinopril Cough    Medications: I have reviewed the patient's current medications.  Results for orders placed during the hospital encounter of 05/12/13 (from the past 48 hour(s))  PRO B NATRIURETIC PEPTIDE     Status: Abnormal   Collection Time    05/12/13  1:11 PM      Result Value Ref Range   Pro B Natriuretic peptide (BNP) >70000.0 (*) 0 - 125 pg/mL  COMPREHENSIVE METABOLIC PANEL     Status: Abnormal   Collection Time    05/12/13  1:11 PM      Result Value Ref Range   Sodium 139  137 - 147 mEq/L   Potassium 2.8 (*) 3.7 - 5.3 mEq/L   Comment: CRITICAL RESULT CALLED TO, READ BACK BY AND VERIFIED WITH:   BRANDY LEAK,RN AT 1411 05/12/13 BY ZBEECH.   Chloride 95 (*) 96 - 112 mEq/L   CO2 30  19 - 32 mEq/L   Glucose, Bld 105 (*) 70 - 99 mg/dL   BUN 18  6 - 23 mg/dL   Creatinine, Ser 3.61 (*) 0.50 - 1.35 mg/dL   Calcium 8.5  8.4 - 10.5 mg/dL   Total Protein 6.0  6.0 - 8.3 g/dL   Albumin 2.0 (*) 3.5 - 5.2 g/dL   AST 9  0 - 37 U/L   ALT <5  0 - 53 U/L   Alkaline Phosphatase 106  39 - 117 U/L   Total Bilirubin 0.4  0.3 - 1.2 mg/dL   GFR calc non Af Amer 15 (*) >90 mL/min   GFR calc Af Amer 18 (*) >90 mL/min   Comment: (NOTE)     The eGFR has been calculated using the CKD EPI equation.     This calculation has not been validated in all clinical situations.     eGFR's persistently <90 mL/min signify possible Chronic Kidney     Disease.  LIPASE, BLOOD     Status: None   Collection Time    05/12/13  1:11 PM      Result Value Ref Range  Lipase 23  11 - 59 U/L  PROTIME-INR     Status: Abnormal   Collection Time    05/12/13  1:11 PM      Result Value Ref Range   Prothrombin Time 25.3 (*) 11.6 - 15.2 seconds   INR 2.39 (*) 0.00 - 1.49  CBC WITH DIFFERENTIAL     Status: Abnormal   Collection Time    05/12/13  1:11 PM      Result Value Ref Range   WBC 5.1  4.0 - 10.5 K/uL   RBC 5.33  4.22 - 5.81 MIL/uL   Hemoglobin 16.6  13.0 - 17.0 g/dL   HCT 49.9  39.0 - 52.0 %   MCV 93.6  78.0 - 100.0 fL   MCH 31.1  26.0 - 34.0 pg   MCHC 33.3  30.0 - 36.0 g/dL   RDW 16.0 (*) 11.5 - 15.5 %   Platelets 141 (*) 150 - 400 K/uL   Neutrophils Relative % 85 (*) 43 - 77 %   Neutro Abs 4.3  1.7 - 7.7 K/uL   Lymphocytes Relative 6 (*) 12 - 46 %   Lymphs Abs 0.3 (*) 0.7 - 4.0 K/uL   Monocytes Relative 8  3 - 12 %   Monocytes Absolute 0.4  0.1 - 1.0 K/uL   Eosinophils Relative 1  0 - 5 %   Eosinophils Absolute 0.0  0.0 - 0.7 K/uL   Basophils Relative 0  0 - 1 %   Basophils Absolute 0.0  0.0 - 0.1 K/uL  VANCOMYCIN, RANDOM     Status: None   Collection Time    05/12/13  7:45 PM      Result Value Ref Range    Vancomycin Rm <5.0     Comment:            Random Vancomycin therapeutic     range is dependent on dosage and     time of specimen collection.     A peak range is 20.0-40.0 ug/mL     A trough range is 5.0-15.0 ug/mL             GLUCOSE, CAPILLARY     Status: Abnormal   Collection Time    05/12/13 10:10 PM      Result Value Ref Range   Glucose-Capillary 162 (*) 70 - 99 mg/dL  PROTIME-INR     Status: Abnormal   Collection Time    05/13/13  5:34 AM      Result Value Ref Range   Prothrombin Time 18.5 (*) 11.6 - 15.2 seconds   INR 1.59 (*) 0.00 - 1.49    Dg Chest 2 View  05/12/2013   CLINICAL DATA:  Diabetes, hypertension, foot ulcer  EXAM: CHEST  2 VIEW  COMPARISON:  DG CHEST 1V PORT dated 04/20/2013; DG THORACIC SPINE dated 04/20/2013  FINDINGS: Moderate to severe cardiac enlargement with stable calcification of the aortic arch. Vascular congestion with mild diffuse interstitial prominence. Small bilateral pleural effusions.  IMPRESSION: Findings suggest congestive heart failure with mild interstitial pulmonary edema.   Electronically Signed   By: Skipper Cliche M.D.   On: 05/12/2013 13:51   Dg Foot Complete Left  05/12/2013   CLINICAL DATA:  Ulcer lateral fifth metacarpophalangeal joint  EXAM: LEFT FOOT - COMPLETE 3+ VIEW  COMPARISON:  MR FOOT*L* W/O CM dated 02/01/2013  FINDINGS: Diffuse small vessel calcification. 4 mm foreign body along the sole underlying the distal fourth metacarpal. Soft tissue defect laterally over  the fifth metacarpophalangeal joint. No periosteal reaction or cortical destruction. No fracture or dislocation.  IMPRESSION: No acute osseous abnormalities. Findings consistent with soft tissue ulcer with no definite evidence of osteomyelitis.  A foreign body is identified as described above.   Electronically Signed   By: Skipper Cliche M.D.   On: 05/12/2013 13:55    Review of Systems  All other systems reviewed and are negative.   Blood pressure 123/61, pulse 80,  temperature 97.5 F (36.4 C), temperature source Oral, resp. rate 16, height 5' 10.08" (1.78 m), weight 69 kg (152 lb 1.9 oz), SpO2 100.00%. Physical Exam On examination patient is alert and oriented. He has a palpable dorsalis pedis pulse on the left. He has an ischemic ulcer over the fifth metatarsal head left foot which is 2 cm x 4 cm. There is no a sending cellulitis but he does have palpable bone. Radiographs are reviewed which shows no destructive bony changes. Examination of the right transtibial amputation shows some mild ischemic changes along the incision line diagnosis skin breakdown no cellulitis no drainage no signs of infection. Assessment/Plan: Assessment: Severe peripheral vascular disease status post right transtibial amputation with ischemic gangrenous ulcer left foot fifth metatarsal head.  Plan: Discussed with the patient that he does have an increased risk of difficulty with wound healing. With his right transtibial amputation patient would like to try for left foot salvage. We will proceed with a fifth ray amputation on the left. Discussed that to avoid all potential future surgery a transtibial amputation would be the best option but I feel that foot salvage is the best in patient's current situation. Patient will plan for surgery Friday afternoon,  will need to schedule dialysis to insure he is available for surgery Friday afternoon.  DUDA,MARCUS V 05/13/2013, 6:46 AM

## 2013-05-13 NOTE — Progress Notes (Addendum)
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 Cultures from SNF with MSSA and group B strep -Appreciate Dr. Trevor Mace input-surgery for this ray amputation planned on Friday 3/20 Active Problems:  End stage renal disease  -Dialysis per renal, and MWF Type 2 diabetes mellitus -Continue monitoring Accu-Cheks and follow with sliding scale insulin Hypertension -BP controlled, Continue metoprolol Atrial fibrillation  -Rate controlled on metoprolol and amiodarone -INR is subtherapeutic at 1.59 today and given the surgery as planned Friday as above will hold Coumadin. -Lovenox for DVT prophylaxis while INR is therapeutic Diastolic CHF, chronic  -Compensated -Volume control continuing with dialysis per renal Peripheral vascular disease  Hx of right BKA  Hypokalemia -Replace K.   Code Status: Full code Family Communication: None at bedside Disposition Plan: Back to SNF when medically ready   Consultants:  Orthopedics  Renal  Procedures:  None  Antibiotics:  Vancomycin started 3/17  Zosyn started 3/17  HPI/Subjective:   Objective: Filed Vitals:   05/13/13 0450  BP: 123/61  Pulse: 80  Temp: 97.5 F (36.4 C)  Resp: 16    Intake/Output Summary (Last 24 hours) at 05/13/13 0958 Last data filed at 05/13/13 0600  Gross per 24 hour  Intake    100 ml  Output      0 ml  Net    100 ml   Filed Weights   05/12/13 1700 05/12/13 1759 05/12/13 2213  Weight: 72.6 kg (160 lb 0.9 oz) 67.6 kg (149 lb 0.5 oz) 69 kg (152 lb 1.9 oz)    Exam:  General: alert & oriented x 3 In NAD Cardiovascular: RRR, nl S1 s2 Respiratory: CTAB Abdomen: soft +BS NT/ND, no masses  palpable Extremities: No cyanosis and no edema, left fifth metatarsal area with gangrenous laterally, ulcer with serous drainage present.    Data Reviewed: Basic Metabolic Panel:  Recent Labs Lab 05/12/13 1311 05/13/13 0534  NA 139 137  K 2.8* 3.2*  CL 95* 94*  CO2 30 27  GLUCOSE 105* 137*  BUN 18 25*  CREATININE 3.61* 4.36*  CALCIUM 8.5 8.6   Liver Function Tests:  Recent Labs Lab 05/12/13 1311  AST 9  ALT <5  ALKPHOS 106  BILITOT 0.4  PROT 6.0  ALBUMIN 2.0*    Recent Labs Lab 05/12/13 1311  LIPASE 23   No results found for this basename: AMMONIA,  in the last 168 hours CBC:  Recent Labs Lab 05/12/13 1311 05/13/13 0534  WBC 5.1 11.7*  NEUTROABS 4.3  --   HGB 16.6 9.6*  HCT 49.9 29.6*  MCV 93.6 92.8  PLT 141* 318   Cardiac Enzymes: No results found for this basename: CKTOTAL, CKMB, CKMBINDEX, TROPONINI,  in the last 168 hours BNP (last 3 results)  Recent Labs  05/12/13 1311  PROBNP >70000.0*   CBG:  Recent Labs Lab 05/12/13 2210 05/13/13 0757  GLUCAP 162* 138*    No results found for this or any previous visit (from the past 240 hour(s)).   Studies: Dg Chest 2 View  05/12/2013   CLINICAL DATA:  Diabetes, hypertension, foot ulcer  EXAM: CHEST  2 VIEW  COMPARISON:  DG CHEST 1V PORT dated 04/20/2013; DG THORACIC SPINE dated 04/20/2013  FINDINGS: Moderate to severe cardiac enlargement with stable  calcification of the aortic arch. Vascular congestion with mild diffuse interstitial prominence. Small bilateral pleural effusions.  IMPRESSION: Findings suggest congestive heart failure with mild interstitial pulmonary edema.   Electronically Signed   By: Skipper Cliche M.D.   On: 05/12/2013 13:51   Dg Foot Complete Left  05/12/2013   CLINICAL DATA:  Ulcer lateral fifth metacarpophalangeal joint  EXAM: LEFT FOOT - COMPLETE 3+ VIEW  COMPARISON:  MR FOOT*L* W/O CM dated 02/01/2013  FINDINGS: Diffuse small vessel calcification. 4 mm foreign body along the  sole underlying the distal fourth metacarpal. Soft tissue defect laterally over the fifth metacarpophalangeal joint. No periosteal reaction or cortical destruction. No fracture or dislocation.  IMPRESSION: No acute osseous abnormalities. Findings consistent with soft tissue ulcer with no definite evidence of osteomyelitis.  A foreign body is identified as described above.   Electronically Signed   By: Skipper Cliche M.D.   On: 05/12/2013 13:55    Scheduled Meds: . amiodarone  100 mg Oral BID  . calcium acetate  2,001 mg Oral TID WC  .  ceFAZolin (ANCEF) IV  2 g Intravenous On Call to OR  . chlorhexidine  60 mL Topical Once  . escitalopram  10 mg Oral Daily  . feeding supplement (PRO-STAT SUGAR FREE 64)  30 mL Oral BID  . gabapentin  300 mg Oral QHS  . insulin aspart  0-15 Units Subcutaneous TID WC  . insulin aspart  0-5 Units Subcutaneous QHS  . levothyroxine  25 mcg Oral QAC breakfast  . lidocaine-prilocaine  1 application Topical Once per day on Mon Wed Fri  . metoprolol  100 mg Oral BID  . piperacillin-tazobactam (ZOSYN)  IV  2.25 g Intravenous Q8H  . sodium chloride  3 mL Intravenous Q12H  . vancomycin  750 mg Intravenous Q M,W,F-HD  . Warfarin - Pharmacist Dosing Inpatient   Does not apply q1800   Continuous Infusions:   Principal Problem:   Diabetic ulcer of left foot Active Problems:   End stage renal disease   Peripheral vascular disease   Hx of right BKA   Atrial fibrillation   Diastolic CHF, chronic   Cellulitis    Time spent: [facility] Pager [phone]. If 7PM-7AM, please contact night-coverage at [email or web address], password [facility] 05/13/2013, 9:58 AM  LOS: 1 day

## 2013-05-13 NOTE — Progress Notes (Signed)
[facility] for lovenox Indication: VTE px while INR <2.0  Allergies  Allergen Reactions  . Ace Inhibitors Other (See Comments)    Unknown   . Lisinopril Cough    Patient Measurements: Height: 5' 10.08" (178 cm) Weight: 152 lb 1.9 oz (69 kg) IBW/kg (Calculated) : 73.18  Vital Signs: Temp: 97.5 F (36.4 C) (03/18 0450) Temp src: Oral (03/18 0450) BP: 123/61 mmHg (03/18 0450) Pulse Rate: 80 (03/18 0450)  Labs:  Recent Labs  05/12/13 1311 05/13/13 0534 05/13/13 0928  HGB 16.6 9.6*  --   HCT 49.9 29.6*  --   PLT 141* 318  --   LABPROT 25.3* 18.5* 16.7*  INR 2.39* 1.59* 1.39  CREATININE 3.61* 4.36*  --     Estimated Creatinine Clearance: 14.9 ml/min (by C-G formula based on Cr of 4.36).   Medical History: Past Medical History  Diagnosis Date  . Renal disorder   . Diabetes mellitus   . Hypertension   . Anemia   . Hyperlipidemia   . Thyroid disease   . Anxiety   . Skin cancer     Medications:  Infusions:     Assessment: 73 yo M with DM and ESRD/HD MWF presents from nursing home for further evaluation of left foot ulcer. His coumadin for afib will be held for surgery on Friday.  INR today 1.39.  Asked to give VTE px Lovenox while INR <2.0    Goal of Therapy:  INR 2-3 Monitor platelets by anticoagulation protocol: Yes   Plan:  -Lovenox 30 mg sq q24 hours - monitor for s/s of bleeding  Excell Seltzer, PharmD Clinical Pharmacist  Pager: [phone] Pharmacy: [phone] 05/13/2013 10:25 AM  ]

## 2013-05-13 NOTE — Consult Note (Signed)
[facility] Renal Consultation Note  Indication for Consultation:  Management of ESRD/hemodialysis; anemia, hypertension/volume and secondary hyperparathyroidism  HPI: Chris Vance is a 73 y.o. male  sent in from nursing home [facility] senior living facility for a left foot ulcer infection. . Nursing home reported Cultures  show a staph aureus and group B strep.He was dialyzed at [facility]  Monday, 05/11/13  was given Zosyn and Vanco during the dialysis. This admit ,Ortho consulted and Dr.Duda noted ischemic gangrenous ulcer of the left foot fifth metatarsal head with plans for fifth ray amputation on the left on Friday 05/15/13.Now no cos , but "feeling kind of rough all over." No sob, chest pain or GI symptoms reported    Past Medical History  Diagnosis Date  . Renal disorder   . Diabetes mellitus   . Hypertension   . Anemia   . Hyperlipidemia   . Thyroid disease   . Anxiety   . Skin cancer     Past Surgical History  Procedure Laterality Date  . Av fistula placement    . Amputation Right 02/13/2013    Procedure: Right Below Knee Amputation;  Surgeon: Newt Minion, MD;  Location: [facility];  Service: Orthopedics;  Laterality: Right;  Right Below Knee Amputation      Family History  Problem Relation Age of Onset  . Asthma Father       reports that he has quit smoking. His smoking use included Cigarettes. He started smoking about 56 years ago. He has a 55 pack-year smoking history. He has never used smokeless tobacco. He reports that he does not drink alcohol or use illicit drugs.   Allergies  Allergen Reactions  . Ace Inhibitors Other (See Comments)    Unknown   . Lisinopril Cough    Prior to Admission medications   Medication Sig Start Date End Date Taking? Authorizing Provider  acetaminophen (TYLENOL) 325 MG tablet Take 650 mg by mouth every 6 (six) hours as needed for mild pain or moderate pain (or Fever >/= 101). 02/16/13  Yes Marrion Coy, MD   amiodarone (PACERONE) 100 MG tablet Take 1 tablet (100 mg total) by mouth 2 (two) times daily. 05/12/13  Yes Ricard Dillon, MD  amoxicillin-clavulanate (AUGMENTIN) 500-125 MG per tablet Take 1 tablet by mouth 2 (two) times daily. For 14 days   Yes Historical Provider, MD  calcium acetate (PHOSLO) 667 MG capsule Take 2,001 mg by mouth 3 (three) times daily with meals.   Yes Historical Provider, MD  escitalopram (LEXAPRO) 10 MG tablet Take 10 mg by mouth daily.   Yes Historical Provider, MD  gabapentin (NEURONTIN) 300 MG capsule Take 300 mg by mouth at bedtime.   Yes Historical Provider, MD  HYDROcodone-acetaminophen (NORCO/VICODIN) 5-325 MG per tablet Take 1 tablet by mouth every 6 (six) hours as needed for moderate pain. 02/17/13  Yes Estill Dooms, MD  insulin lispro (HUMALOG) 100 UNIT/ML injection Inject 2-10 Units into the skin 4 (four) times daily -  before meals and at bedtime.   Yes Historical Provider, MD  levothyroxine (SYNTHROID, LEVOTHROID) 25 MCG tablet Take 25 mcg by mouth daily before breakfast.   Yes Historical Provider, MD  lidocaine-prilocaine (EMLA) cream Apply 1 application topically 3 (three) times a week. Dialysis med   Yes Historical Provider, MD  metoprolol (LOPRESSOR) 100 MG tablet Take 100 mg by mouth 2 (two) times daily. 02/16/13  Yes Marrion Coy, MD  warfarin (COUMADIN) 1 MG tablet Take 1  mg by mouth daily.   Yes Historical Provider, MD  Amino Acids-Protein Hydrolys (FEEDING SUPPLEMENT, PRO-STAT SUGAR FREE 64,) LIQD Take 30 mLs by mouth 2 (two) times daily.    Historical Provider, MD     Anti-infectives   Start     Dose/Rate Route Frequency Ordered Stop   05/15/13 0600  ceFAZolin (ANCEF) IVPB 2 g/50 mL premix     2 g 100 mL/hr over 30 Minutes Intravenous On call to O.R. 05/13/13 0955 05/16/13 0559   05/13/13 1200  vancomycin (VANCOCIN) IVPB 750 mg/150 ml premix     750 mg 150 mL/hr over 60 Minutes Intravenous Every M-W-F (Hemodialysis) 05/12/13 2112      05/12/13 2200  vancomycin (VANCOCIN) 1,500 mg in sodium chloride 0.9 % 500 mL IVPB     1,500 mg 250 mL/hr over 120 Minutes Intravenous  Once 05/12/13 2112 05/13/13 0057   05/12/13 1800  piperacillin-tazobactam (ZOSYN) IVPB 2.25 g  Status:  Discontinued     2.25 g 100 mL/hr over 30 Minutes Intravenous 4 times per day 05/12/13 1747 05/12/13 1750   05/12/13 1730  piperacillin-tazobactam (ZOSYN) IVPB 2.25 g     2.25 g 100 mL/hr over 30 Minutes Intravenous Every 8 hours 05/12/13 1711        Results for orders placed during the hospital encounter of 05/12/13 (from the past 48 hour(s))  PRO B NATRIURETIC PEPTIDE     Status: Abnormal   Collection Time    05/12/13  1:11 PM      Result Value Ref Range   Pro B Natriuretic peptide (BNP) >70000.0 (*) 0 - 125 pg/mL  COMPREHENSIVE METABOLIC PANEL     Status: Abnormal   Collection Time    05/12/13  1:11 PM      Result Value Ref Range   Sodium 139  137 - 147 mEq/L   Potassium 2.8 (*) 3.7 - 5.3 mEq/L   Comment: CRITICAL RESULT CALLED TO, READ BACK BY AND VERIFIED WITH:     BRANDY LEAK,RN AT 1411 05/12/13 BY ZBEECH.   Chloride 95 (*) 96 - 112 mEq/L   CO2 30  19 - 32 mEq/L   Glucose, Bld 105 (*) 70 - 99 mg/dL   BUN 18  6 - 23 mg/dL   Creatinine, Ser 3.61 (*) 0.50 - 1.35 mg/dL   Calcium 8.5  8.4 - 10.5 mg/dL   Total Protein 6.0  6.0 - 8.3 g/dL   Albumin 2.0 (*) 3.5 - 5.2 g/dL   AST 9  0 - 37 U/L   ALT <5  0 - 53 U/L   Alkaline Phosphatase 106  39 - 117 U/L   Total Bilirubin 0.4  0.3 - 1.2 mg/dL   GFR calc non Af Amer 15 (*) >90 mL/min   GFR calc Af Amer 18 (*) >90 mL/min   Comment: (NOTE)     The eGFR has been calculated using the CKD EPI equation.     This calculation has not been validated in all clinical situations.     eGFR's persistently <90 mL/min signify possible Chronic Kidney     Disease.  LIPASE, BLOOD     Status: None   Collection Time    05/12/13  1:11 PM      Result Value Ref Range   Lipase 23  11 - 59 U/L  PROTIME-INR      Status: Abnormal   Collection Time    05/12/13  1:11 PM      Result Value Ref  Range   Prothrombin Time 25.3 (*) 11.6 - 15.2 seconds   INR 2.39 (*) 0.00 - 1.49  CBC WITH DIFFERENTIAL     Status: Abnormal   Collection Time    05/12/13  1:11 PM      Result Value Ref Range   WBC 5.1  4.0 - 10.5 K/uL   RBC 5.33  4.22 - 5.81 MIL/uL   Hemoglobin 16.6  13.0 - 17.0 g/dL   HCT 49.9  39.0 - 52.0 %   MCV 93.6  78.0 - 100.0 fL   MCH 31.1  26.0 - 34.0 pg   MCHC 33.3  30.0 - 36.0 g/dL   RDW 16.0 (*) 11.5 - 15.5 %   Platelets 141 (*) 150 - 400 K/uL   Neutrophils Relative % 85 (*) 43 - 77 %   Neutro Abs 4.3  1.7 - 7.7 K/uL   Lymphocytes Relative 6 (*) 12 - 46 %   Lymphs Abs 0.3 (*) 0.7 - 4.0 K/uL   Monocytes Relative 8  3 - 12 %   Monocytes Absolute 0.4  0.1 - 1.0 K/uL   Eosinophils Relative 1  0 - 5 %   Eosinophils Absolute 0.0  0.0 - 0.7 K/uL   Basophils Relative 0  0 - 1 %   Basophils Absolute 0.0  0.0 - 0.1 K/uL  VANCOMYCIN, RANDOM     Status: None   Collection Time    05/12/13  7:45 PM      Result Value Ref Range   Vancomycin Rm <5.0     Comment:            Random Vancomycin therapeutic     range is dependent on dosage and     time of specimen collection.     A peak range is 20.0-40.0 ug/mL     A trough range is 5.0-15.0 ug/mL             GLUCOSE, CAPILLARY     Status: Abnormal   Collection Time    05/12/13 10:10 PM      Result Value Ref Range   Glucose-Capillary 162 (*) 70 - 99 mg/dL  PROTIME-INR     Status: Abnormal   Collection Time    05/13/13  5:34 AM      Result Value Ref Range   Prothrombin Time 18.5 (*) 11.6 - 15.2 seconds   INR 1.59 (*) 0.00 - 6.54  BASIC METABOLIC PANEL     Status: Abnormal   Collection Time    05/13/13  5:34 AM      Result Value Ref Range   Sodium 137  137 - 147 mEq/L   Potassium 3.2 (*) 3.7 - 5.3 mEq/L   Chloride 94 (*) 96 - 112 mEq/L   CO2 27  19 - 32 mEq/L   Glucose, Bld 137 (*) 70 - 99 mg/dL   BUN 25 (*) 6 - 23 mg/dL   Creatinine, Ser  4.36 (*) 0.50 - 1.35 mg/dL   Calcium 8.6  8.4 - 10.5 mg/dL   GFR calc non Af Amer 12 (*) >90 mL/min   GFR calc Af Amer 14 (*) >90 mL/min   Comment: (NOTE)     The eGFR has been calculated using the CKD EPI equation.     This calculation has not been validated in all clinical situations.     eGFR's persistently <90 mL/min signify possible Chronic Kidney     Disease.  CBC     Status: Abnormal  Collection Time    05/13/13  5:34 AM      Result Value Ref Range   WBC 11.7 (*) 4.0 - 10.5 K/uL   RBC 3.19 (*) 4.22 - 5.81 MIL/uL   Hemoglobin 9.6 (*) 13.0 - 17.0 g/dL   Comment: SPECIMEN CHECKED FOR CLOTS     REPEATED TO VERIFY   HCT 29.6 (*) 39.0 - 52.0 %   MCV 92.8  78.0 - 100.0 fL   MCH 30.1  26.0 - 34.0 pg   MCHC 32.4  30.0 - 36.0 g/dL   RDW 15.9 (*) 11.5 - 15.5 %   Platelets 318  150 - 400 K/uL   Comment: SPECIMEN CHECKED FOR CLOTS     REPEATED TO VERIFY    ROS: see hpi  Physical Exam: Filed Vitals:   05/13/13 0450  BP: 123/61  Pulse: 80  Temp: 97.5 F (36.4 C)  Resp: 16     General: Alert, elderly  Breaded WM  chronically ill wm appears older than stated age, no distress communicative  HEENT: Healing midline forehead abrasion, non icteric. MMM Neck: supple no jvd Heart: RRR, no rub or mur Lungs:C TA bilat. Abdomen: bs pos. Soft, NT,ND Extremities: no pedal edema Skin: L 5th  Toe / metatarsal  Area with gangrenous ulcer Neuro: Alert, [facility] , Tuesday, "i know you from  HD",=  normal pleasantly confused MS for Him, No acute deficits moves all extremities  Dialysis Access: Pos. Bruit R FA AVF  Dialysis Orders: Center: [facility]  on MWF . EDW 68.5kg HD Bath 2k,2.5 ca  Time 3hr 11mn Heparin  7,000. Access R FA AVF BFR 450  DFR af 1.5    Hectorol 0 mcg IV/HD Epogen 2600   Units IV/HD  Venofer  0  Other 0  Assessment/Plan 1. L Gangrenous/ Ischemic 5th Metatarsal Head = iv vanco/ Zosyn and amputation surgery for this Friday 2. ESRD -  Low k noted po supplement . Added k bath on  hd MWF HD  3. Hypertension/volume  - bp stable uf 2 l on hd as tol. / lopressor 1077mbid 4. Anemia  - hgb 9.6 esa on hd 5. Metabolic bone disease -  phoslo with meals/ no vit d  6. Nutrition - renal/carbmod diet, Renal vitamin on prostat 7. Afib= lopressor, amiodarone , coumadin  DaErnest HaberPA-C CaNorth [phone]/18/2015, 8:23 AM

## 2013-05-13 NOTE — Consult Note (Signed)
I saw the patient and agree with the above assessment and plan.    Doing well.  No c/o.  Note plans for amputation 05/15/13

## 2013-05-14 ENCOUNTER — Encounter (HOSPITAL_BASED_OUTPATIENT_CLINIC_OR_DEPARTMENT_OTHER): Payer: Medicare Other | Attending: Internal Medicine

## 2013-05-14 DIAGNOSIS — I5032 Chronic diastolic (congestive) heart failure: Secondary | ICD-10-CM

## 2013-05-14 DIAGNOSIS — I509 Heart failure, unspecified: Secondary | ICD-10-CM

## 2013-05-14 LAB — GLUCOSE, CAPILLARY
GLUCOSE-CAPILLARY: 148 mg/dL — AB (ref 70–99)
GLUCOSE-CAPILLARY: 164 mg/dL — AB (ref 70–99)
GLUCOSE-CAPILLARY: 97 mg/dL (ref 70–99)
Glucose-Capillary: 108 mg/dL — ABNORMAL HIGH (ref 70–99)
Glucose-Capillary: 130 mg/dL — ABNORMAL HIGH (ref 70–99)

## 2013-05-14 LAB — PROTIME-INR
INR: 1.6 — ABNORMAL HIGH (ref 0.00–1.49)
Prothrombin Time: 18.6 seconds — ABNORMAL HIGH (ref 11.6–15.2)

## 2013-05-14 MED ORDER — POTASSIUM CHLORIDE CRYS ER 20 MEQ PO TBCR
40.0000 meq | EXTENDED_RELEASE_TABLET | Freq: Once | ORAL | Status: AC
  Administered 2013-05-14: 40 meq via ORAL
  Filled 2013-05-14: qty 2

## 2013-05-14 NOTE — Progress Notes (Signed)
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 Cultures from SNF with MSSA and group B strep -Appreciate Dr. Jess Barters input-surgery for this ray amputation planned on Friday 3/20>> pt requesting to talk with him prior to surgery and his office notified Active Problems:  End stage renal disease  -Dialysis per renal, and MWF Type 2 diabetes mellitus -Continue monitoring Accu-Cheks and follow with sliding scale insulin Hypertension -BP controlled, Continue metoprolol Atrial fibrillation  -Rate controlled on metoprolol and amiodarone -INR is subtherapeutic at 1.59 today and given the surgery as planned Friday as above will hold Coumadin. -Lovenox for DVT prophylaxis while INR is therapeutic Diastolic CHF, chronic  -Compensated -Volume control continuing with dialysis per renal Peripheral vascular disease  Hx of right BKA  Hypokalemia -again Replace K.   Code Status: Full code Family Communication: None at bedside Disposition Plan: Back to SNF when medically ready   Consultants:  Orthopedics  Renal  Procedures:  None  Antibiotics:  Vancomycin started 3/17  Zosyn started 3/17  HPI/Subjective: Pt denies any new complaints today, states he wants to talk with Dr Sharol Given prior to the surgery  Objective: Filed Vitals:   05/14/13 1738  BP: 115/62  Pulse: 85  Temp: 98.1 F (36.7 C)  Resp: 18    Intake/Output Summary (Last 24 hours) at 05/14/13 1808 Last data filed at 05/14/13 1700  Gross per 24 hour  Intake    850 ml  Output   2000 ml  Net  -1150 ml   Filed Weights   05/12/13 1759 05/12/13 2213 05/13/13 1817  Weight: 67.6 kg (149 lb 0.5 oz) 69 kg (152 lb  1.9 oz) 65.8 kg (145 lb 1 oz)    Exam:  General: alert & oriented x 3 In NAD Cardiovascular: RRR, nl S1 s2 Respiratory: CTAB Abdomen: soft +BS NT/ND, no masses palpable Extremities: No cyanosis and no edema, left fifth metatarsal area with gangrenous laterally, ulcer with serous drainage present.    Data Reviewed: Basic Metabolic Panel:  Recent Labs Lab 05/12/13 1311 05/13/13 0534  NA 139 137  K 2.8* 3.2*  CL 95* 94*  CO2 30 27  GLUCOSE 105* 137*  BUN 18 25*  CREATININE 3.61* 4.36*  CALCIUM 8.5 8.6   Liver Function Tests:  Recent Labs Lab 05/12/13 1311  AST 9  ALT <5  ALKPHOS 106  BILITOT 0.4  PROT 6.0  ALBUMIN 2.0*    Recent Labs Lab 05/12/13 1311  LIPASE 23   No results found for this basename: AMMONIA,  in the last 168 hours CBC:  Recent Labs Lab 05/12/13 1311 05/13/13 0534  WBC 5.1 11.7*  NEUTROABS 4.3  --   HGB 16.6 9.6*  HCT 49.9 29.6*  MCV 93.6 92.8  PLT 141* 318   Cardiac Enzymes: No results found for this basename: CKTOTAL, CKMB, CKMBINDEX, TROPONINI,  in the last 168 hours BNP (last 3 results)  Recent Labs  05/12/13 1311  PROBNP >70000.0*   CBG:  Recent Labs Lab 05/13/13 2158 05/14/13 0736 05/14/13 0753 05/14/13 1151 05/14/13 1659  GLUCAP 130* 148* 108* 164* 97    No results found for this or any previous visit (from the past 240 hour(s)).   Studies: No results found.  Scheduled Meds: . amiodarone  100 mg Oral BID  . calcium acetate  2,001 mg Oral TID WC  . [START ON 05/15/2013]  ceFAZolin (ANCEF) IV  2 g Intravenous On Call to OR  . [START ON 05/15/2013] chlorhexidine  60 mL Topical Once  . darbepoetin (ARANESP) injection - DIALYSIS  100 mcg Intravenous Q Wed-HD  . enoxaparin (LOVENOX) injection  30 mg Subcutaneous Q24H  . escitalopram  10 mg Oral Daily  . feeding supplement (NEPRO CARB STEADY)  237 mL Oral Q24H  . feeding supplement (PRO-STAT SUGAR FREE 64)  30 mL Oral BID  . gabapentin  300 mg Oral QHS  .  insulin aspart  0-15 Units Subcutaneous TID WC  . insulin aspart  0-5 Units Subcutaneous QHS  . levothyroxine  25 mcg Oral QAC breakfast  . lidocaine-prilocaine  1 application Topical Once per day on Mon Wed Fri  . metoprolol  100 mg Oral BID  . piperacillin-tazobactam (ZOSYN)  IV  2.25 g Intravenous Q8H  . sodium chloride  3 mL Intravenous Q12H  . vancomycin  750 mg Intravenous Q M,W,F-HD   Continuous Infusions:   Principal Problem:   Diabetic ulcer of left foot Active Problems:   End stage renal disease   Peripheral vascular disease   Hx of right BKA   Atrial fibrillation   Diastolic CHF, chronic   Cellulitis    Time spent: [facility] Pager [phone]. If 7PM-7AM, please contact night-coverage at [email or web address], password [facility] 05/14/2013, 6:08 PM  LOS: 2 days

## 2013-05-14 NOTE — Progress Notes (Signed)
Pt requested to speak with Dr. Sharol Given before amputation tomorrow. Called to alert Dr. Jess Barters office and staff states they will make him aware.

## 2013-05-14 NOTE — Progress Notes (Signed)
Subjective:  Tolerated hd yest./ for am R ft surgery slightly depressed about it but knows he has to have it Objective Vital signs in last 24 hours: Filed Vitals:   05/13/13 1850 05/13/13 2200 05/14/13 0500 05/14/13 0948  BP: 138/60 120/48 134/57 132/62  Pulse: 85 79 86 85  Temp: 98 F (36.7 C)  97.8 F (36.6 C) 98.6 F (37 C)  TempSrc: Oral  Oral Oral  Resp: 16  18 18   Height:      Weight:      SpO2: 98%  95% 96%  General: Alert, NAD  Heart: RRR, no rub or mur  Lungs:C TA bilat.  Abdomen: bs pos. Soft, NT,ND  Extremities: no pedal edema  Skin: L 5th Toe / metatarsal Area with gangrenous ulcer  Dialysis Access: Pos. Bruit R FA AVF   Dialysis Orders: Center: [facility] on MWF .  EDW 68.5kg HD Bath 2k,2.5 ca Time 3hr 80min Heparin 7,000. Access R FA AVF BFR 450 DFR af 1.5  Hectorol 0 mcg IV/HD Epogen 2600 Units IV/HD Venofer 0  Other 0   Assessment/Plan  1. L Gangrenous/ Ischemic 5th Metatarsal Head = iv vanco/ Zosyn and amputation surgery  For tomorrow  2. ESRD - HD on schedule MWF/ k 3.2 yesterday  Used 4kbath  / fu labs 3. Hypertension/volume - bp stable uf 2 l on hd yesterday at his edw / lopressor 100mg  bid 4. Anemia - hgb 9.6 esa on hd 5. Metabolic bone disease - phoslo with meals/ no vit d  6. Nutrition - renal/carbmod diet, Renal vitamin on prostat 7. Afib= lopressor, amiodarone , coumadin  Weight change: -6.8 kg (-14 lb 15.9 oz)  Labs: Basic Metabolic Panel:  Recent Labs Lab 05/12/13 1311 05/13/13 0534  NA 139 137  K 2.8* 3.2*  CL 95* 94*  CO2 30 27  GLUCOSE 105* 137*  BUN 18 25*  CREATININE 3.61* 4.36*  CALCIUM 8.5 8.6   Liver Function Tests:  Recent Labs Lab 05/12/13 1311  AST 9  ALT <5  ALKPHOS 106  BILITOT 0.4  PROT 6.0  ALBUMIN 2.0*    Recent Labs Lab 05/12/13 1311  LIPASE 23  CBC:  Recent Labs Lab 05/12/13 1311 05/13/13 0534  WBC 5.1 11.7*  NEUTROABS 4.3  --   HGB 16.6 9.6*  HCT 49.9 29.6*  MCV 93.6 92.8  PLT 141* 318     Recent Labs Lab 05/13/13 1137 05/13/13 1847 05/13/13 2158 05/14/13 0736 05/14/13 0753  GLUCAP 162* 134* 130* 148* 108*    Studies/Results: Dg Chest 2 View  05/12/2013   CLINICAL DATA:  Diabetes, hypertension, foot ulcer  EXAM: CHEST  2 VIEW  COMPARISON:  DG CHEST 1V PORT dated 04/20/2013; DG THORACIC SPINE dated 04/20/2013  FINDINGS: Moderate to severe cardiac enlargement with stable calcification of the aortic arch. Vascular congestion with mild diffuse interstitial prominence. Small bilateral pleural effusions.  IMPRESSION: Findings suggest congestive heart failure with mild interstitial pulmonary edema.   Electronically Signed   By: Skipper Cliche M.D.   On: 05/12/2013 13:51   Dg Foot Complete Left  05/12/2013   CLINICAL DATA:  Ulcer lateral fifth metacarpophalangeal joint  EXAM: LEFT FOOT - COMPLETE 3+ VIEW  COMPARISON:  MR FOOT*L* W/O CM dated 02/01/2013  FINDINGS: Diffuse small vessel calcification. 4 mm foreign body along the sole underlying the distal fourth metacarpal. Soft tissue defect laterally over the fifth metacarpophalangeal joint. No periosteal reaction or cortical destruction. No fracture or dislocation.  IMPRESSION: No acute  osseous abnormalities. Findings consistent with soft tissue ulcer with no definite evidence of osteomyelitis.  A foreign body is identified as described above.   Electronically Signed   By: Skipper Cliche M.D.   On: 05/12/2013 13:55   Medications:   . amiodarone  100 mg Oral BID  . calcium acetate  2,001 mg Oral TID WC  . [START ON 05/15/2013]  ceFAZolin (ANCEF) IV  2 g Intravenous On Call to OR  . [START ON 05/15/2013] chlorhexidine  60 mL Topical Once  . darbepoetin (ARANESP) injection - DIALYSIS  100 mcg Intravenous Q Wed-HD  . enoxaparin (LOVENOX) injection  30 mg Subcutaneous Q24H  . escitalopram  10 mg Oral Daily  . feeding supplement (NEPRO CARB STEADY)  237 mL Oral Q24H  . feeding supplement (PRO-STAT SUGAR FREE 64)  30 mL Oral BID  .  gabapentin  300 mg Oral QHS  . insulin aspart  0-15 Units Subcutaneous TID WC  . insulin aspart  0-5 Units Subcutaneous QHS  . levothyroxine  25 mcg Oral QAC breakfast  . lidocaine-prilocaine  1 application Topical Once per day on Mon Wed Fri  . metoprolol  100 mg Oral BID  . piperacillin-tazobactam (ZOSYN)  IV  2.25 g Intravenous Q8H  . sodium chloride  3 mL Intravenous Q12H  . vancomycin  750 mg Intravenous Q M,W,F-HD    Ernest Haber, PA-C [place] [phone] 05/14/2013,10:05 AM  LOS: 2 days

## 2013-05-14 NOTE — Progress Notes (Signed)
I saw the patient and agree with the above assessment and plan.

## 2013-05-15 ENCOUNTER — Encounter (HOSPITAL_COMMUNITY)
Admission: EM | Disposition: A | Discharge status: Skilled Nursing Facility | Payer: Self-pay | Source: Home / Self Care | Attending: Internal Medicine

## 2013-05-15 ENCOUNTER — Encounter (HOSPITAL_COMMUNITY): Payer: Medicare Other | Admitting: Anesthesiology

## 2013-05-15 ENCOUNTER — Encounter (HOSPITAL_COMMUNITY): Payer: Self-pay | Admitting: Anesthesiology

## 2013-05-15 ENCOUNTER — Inpatient Hospital Stay (HOSPITAL_COMMUNITY): Payer: Medicare Other

## 2013-05-15 ENCOUNTER — Inpatient Hospital Stay (HOSPITAL_COMMUNITY): Payer: Medicare Other | Admitting: Anesthesiology

## 2013-05-15 HISTORY — PX: AMPUTATION: SHX166

## 2013-05-15 LAB — RENAL FUNCTION PANEL
Albumin: 2.1 g/dL — ABNORMAL LOW (ref 3.5–5.2)
BUN: 26 mg/dL — AB (ref 6–23)
CALCIUM: 9 mg/dL (ref 8.4–10.5)
CHLORIDE: 94 meq/L — AB (ref 96–112)
CO2: 25 meq/L (ref 19–32)
CREATININE: 4.04 mg/dL — AB (ref 0.50–1.35)
GFR calc Af Amer: 16 mL/min — ABNORMAL LOW (ref >90)
GFR, EST NON AFRICAN AMERICAN: 14 mL/min — AB (ref >90)
GLUCOSE: 135 mg/dL — AB (ref 70–99)
Phosphorus: 2.4 mg/dL (ref 2.3–4.6)
Potassium: 4.3 mEq/L (ref 3.7–5.3)
Sodium: 137 mEq/L (ref 137–147)

## 2013-05-15 LAB — CBC
HEMATOCRIT: 31.8 % — AB (ref 39.0–52.0)
Hemoglobin: 10.3 g/dL — ABNORMAL LOW (ref 13.0–17.0)
MCH: 30.3 pg (ref 26.0–34.0)
MCHC: 32.4 g/dL (ref 30.0–36.0)
MCV: 93.5 fL (ref 78.0–100.0)
PLATELETS: 425 10*3/uL — AB (ref 150–400)
RBC: 3.4 MIL/uL — AB (ref 4.22–5.81)
RDW: 15.9 % — ABNORMAL HIGH (ref 11.5–15.5)
WBC: 15.5 10*3/uL — ABNORMAL HIGH (ref 4.0–10.5)

## 2013-05-15 LAB — GLUCOSE, CAPILLARY
Glucose-Capillary: 127 mg/dL — ABNORMAL HIGH (ref 70–99)
Glucose-Capillary: 122 mg/dL — ABNORMAL HIGH (ref 70–99)
Glucose-Capillary: 128 mg/dL — ABNORMAL HIGH (ref 70–99)
Glucose-Capillary: 140 mg/dL — ABNORMAL HIGH (ref 70–99)
Glucose-Capillary: 129 mg/dL — ABNORMAL HIGH (ref 70–99)

## 2013-05-15 LAB — PROTIME-INR
INR: 1.53 — ABNORMAL HIGH (ref 0.00–1.49)
Prothrombin Time: 18 seconds — ABNORMAL HIGH (ref 11.6–15.2)

## 2013-05-15 SURGERY — AMPUTATION, FOOT, RAY
Anesthesia: General | Site: Foot | Laterality: Left

## 2013-05-15 MED ORDER — 0.9 % SODIUM CHLORIDE (POUR BTL) OPTIME
TOPICAL | Status: DC | PRN
  Administered 2013-05-15: 1000 mL

## 2013-05-15 MED ORDER — HYDROMORPHONE HCL PF 1 MG/ML IJ SOLN: 0.5000 mg | INTRAMUSCULAR | Status: DC | PRN

## 2013-05-15 MED ORDER — PROPOFOL 10 MG/ML IV BOLUS
INTRAVENOUS | Status: DC | PRN
  Administered 2013-05-15 (×2): 50 mg via INTRAVENOUS
  Administered 2013-05-15: 30 mg via INTRAVENOUS

## 2013-05-15 MED ORDER — LIDOCAINE HCL (CARDIAC) 20 MG/ML IV SOLN
INTRAVENOUS | Status: DC | PRN
  Administered 2013-05-15: 50 mg via INTRAVENOUS

## 2013-05-15 MED ORDER — FENTANYL CITRATE 0.05 MG/ML IJ SOLN
25.0000 ug | INTRAMUSCULAR | Status: DC | PRN
Start: 2013-05-15 — End: 2013-05-15

## 2013-05-15 MED ORDER — ONDANSETRON HCL 4 MG/2ML IJ SOLN: 4.0000 mg | Freq: Once | INTRAMUSCULAR | Status: DC | PRN

## 2013-05-15 MED ORDER — METOCLOPRAMIDE HCL 5 MG PO TABS: 5.0000 mg | ORAL_TABLET | Freq: Three times a day (TID) | ORAL | Status: DC | PRN

## 2013-05-15 MED ORDER — ONDANSETRON HCL 4 MG/2ML IJ SOLN
INTRAMUSCULAR | Status: AC
  Filled 2013-05-15: qty 2

## 2013-05-15 MED ORDER — ONDANSETRON HCL 4 MG/2ML IJ SOLN
4.0000 mg | Freq: Four times a day (QID) | INTRAMUSCULAR | Status: DC | PRN
  Administered 2013-05-20: 4 mg via INTRAVENOUS
  Filled 2013-05-15: qty 2

## 2013-05-15 MED ORDER — LACTATED RINGERS IV SOLN
INTRAVENOUS | Status: DC | PRN
  Administered 2013-05-15: 14:00:00 via INTRAVENOUS

## 2013-05-15 MED ORDER — CEFAZOLIN SODIUM-DEXTROSE 2-3 GM-% IV SOLR
INTRAVENOUS | Status: AC
  Filled 2013-05-15: qty 50

## 2013-05-15 MED ORDER — ONDANSETRON HCL 4 MG/2ML IJ SOLN
INTRAMUSCULAR | Status: DC | PRN
  Administered 2013-05-15: 4 mg via INTRAVENOUS

## 2013-05-15 MED ORDER — PROPOFOL 10 MG/ML IV BOLUS
INTRAVENOUS | Status: AC
  Filled 2013-05-15: qty 20

## 2013-05-15 MED ORDER — OXYCODONE HCL 5 MG/5ML PO SOLN: 5.0000 mg | Freq: Once | ORAL | Status: DC | PRN

## 2013-05-15 MED ORDER — OXYCODONE-ACETAMINOPHEN 5-325 MG PO TABS: 1.0000 | ORAL_TABLET | ORAL | Status: DC | PRN

## 2013-05-15 MED ORDER — METOCLOPRAMIDE HCL 5 MG/ML IJ SOLN: 5.0000 mg | Freq: Three times a day (TID) | INTRAMUSCULAR | Status: DC | PRN

## 2013-05-15 MED ORDER — FENTANYL CITRATE 0.05 MG/ML IJ SOLN
INTRAMUSCULAR | Status: AC
  Filled 2013-05-15: qty 5

## 2013-05-15 MED ORDER — LIDOCAINE HCL (CARDIAC) 20 MG/ML IV SOLN
INTRAVENOUS | Status: AC
  Filled 2013-05-15: qty 5

## 2013-05-15 MED ORDER — ONDANSETRON HCL 4 MG PO TABS: 4.0000 mg | ORAL_TABLET | Freq: Four times a day (QID) | ORAL | Status: DC | PRN

## 2013-05-15 MED ORDER — MIDAZOLAM HCL 5 MG/5ML IJ SOLN
INTRAMUSCULAR | Status: DC | PRN
  Administered 2013-05-15: 1 mg via INTRAVENOUS

## 2013-05-15 MED ORDER — PENTAFLUOROPROP-TETRAFLUOROETH EX AERO
INHALATION_SPRAY | CUTANEOUS | Status: AC
  Administered 2013-05-15: 09:00:00
  Filled 2013-05-15: qty 103.5

## 2013-05-15 MED ORDER — FENTANYL CITRATE 0.05 MG/ML IJ SOLN
INTRAMUSCULAR | Status: DC | PRN
  Administered 2013-05-15: 25 ug via INTRAVENOUS

## 2013-05-15 MED ORDER — MIDAZOLAM HCL 2 MG/2ML IJ SOLN
INTRAMUSCULAR | Status: AC
  Filled 2013-05-15: qty 2

## 2013-05-15 MED ORDER — OXYCODONE HCL 5 MG PO TABS: 5.0000 mg | ORAL_TABLET | Freq: Once | ORAL | Status: DC | PRN

## 2013-05-15 SURGICAL SUPPLY — 29 items
BANDAGE GAUZE ELAST BULKY 4 IN (GAUZE/BANDAGES/DRESSINGS) ×3 IMPLANT
BLADE SAW SGTL MED 73X18.5 STR (BLADE) IMPLANT
BNDG COHESIVE 4X5 TAN STRL (GAUZE/BANDAGES/DRESSINGS) ×3 IMPLANT
COVER SURGICAL LIGHT HANDLE (MISCELLANEOUS) ×3 IMPLANT
DRAPE U-SHAPE 47X51 STRL (DRAPES) ×6 IMPLANT
DRSG ADAPTIC 3X8 NADH LF (GAUZE/BANDAGES/DRESSINGS) ×3 IMPLANT
DRSG PAD ABDOMINAL 8X10 ST (GAUZE/BANDAGES/DRESSINGS) ×6 IMPLANT
DURAPREP 26ML APPLICATOR (WOUND CARE) ×3 IMPLANT
ELECT REM PT RETURN 9FT ADLT (ELECTROSURGICAL) ×3
ELECTRODE REM PT RTRN 9FT ADLT (ELECTROSURGICAL) ×1 IMPLANT
GLOVE BIOGEL PI IND STRL 9 (GLOVE) ×1 IMPLANT
GLOVE BIOGEL PI INDICATOR 9 (GLOVE) ×2
GLOVE SURG ORTHO 9.0 STRL STRW (GLOVE) ×3 IMPLANT
GOWN STRL REUS W/ TWL XL LVL3 (GOWN DISPOSABLE) ×2 IMPLANT
GOWN STRL REUS W/TWL XL LVL3 (GOWN DISPOSABLE) ×6
KIT BASIN OR (CUSTOM PROCEDURE TRAY) ×3 IMPLANT
KIT ROOM TURNOVER OR (KITS) ×3 IMPLANT
NS IRRIG 1000ML POUR BTL (IV SOLUTION) ×3 IMPLANT
PACK ORTHO EXTREMITY (CUSTOM PROCEDURE TRAY) ×3 IMPLANT
PAD ABD 8X10 STRL (GAUZE/BANDAGES/DRESSINGS) ×2 IMPLANT
PAD ARMBOARD 7.5X6 YLW CONV (MISCELLANEOUS) ×6 IMPLANT
SPONGE GAUZE 4X4 12PLY (GAUZE/BANDAGES/DRESSINGS) ×3 IMPLANT
SPONGE LAP 18X18 X RAY DECT (DISPOSABLE) ×3 IMPLANT
STOCKINETTE IMPERVIOUS LG (DRAPES) IMPLANT
SUT ETHILON 2 0 PSLX (SUTURE) ×6 IMPLANT
TOWEL OR 17X24 6PK STRL BLUE (TOWEL DISPOSABLE) ×3 IMPLANT
TOWEL OR 17X26 10 PK STRL BLUE (TOWEL DISPOSABLE) ×3 IMPLANT
UNDERPAD 30X30 INCONTINENT (UNDERPADS AND DIAPERS) ×3 IMPLANT
WATER STERILE IRR 1000ML POUR (IV SOLUTION) ×3 IMPLANT

## 2013-05-15 NOTE — H&P (Signed)
Chris Vance is an 73 y.o. male.   Chief Complaint: Osteomyelitis left foot fifth metatarsal head HPI: Patient is a 73 year old gentleman diabetes and peripheral vascular disease who is status post a fifth toe amputation. 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  Past Medical History  Diagnosis Date  . Renal disorder   . Diabetes mellitus   . Hypertension   . Anemia   . Hyperlipidemia   . Thyroid disease   . Anxiety   . Skin cancer     Past Surgical History  Procedure Laterality Date  . Av fistula placement    . Amputation Right 02/13/2013    Procedure: Right Below Knee Amputation;  Surgeon: Newt Minion, MD;  Location: [facility];  Service: Orthopedics;  Laterality: Right;  Right Below Knee Amputation    Family History  Problem Relation Age of Onset  . Asthma Father    Social History:  reports that he has quit smoking. His smoking use included Cigarettes. He started smoking about 56 years ago. He has a 55 pack-year smoking history. He has never used smokeless tobacco. He reports that he does not drink alcohol or use illicit drugs.  Allergies:  Allergies  Allergen Reactions  . Ace Inhibitors Other (See Comments)    Unknown   . Lisinopril Cough    Medications Prior to Admission  Medication Sig Dispense Refill  . acetaminophen (TYLENOL) 325 MG tablet Take 650 mg by mouth every 6 (six) hours as needed for mild pain or moderate pain (or Fever >/= 101).      Marland Kitchen amiodarone (PACERONE) 100 MG tablet Take 1 tablet (100 mg total) by mouth 2 (two) times daily.      Marland Kitchen amoxicillin-clavulanate (AUGMENTIN) 500-125 MG per tablet Take 1 tablet by mouth 2 (two) times daily. For 14 days      . calcium acetate (PHOSLO) 667 MG capsule Take 2,001 mg by mouth 3 (three) times daily with meals.      Marland Kitchen escitalopram (LEXAPRO) 10 MG tablet Take 10 mg by mouth daily.      Marland Kitchen gabapentin (NEURONTIN) 300 MG capsule Take 300 mg by mouth at  bedtime.      Marland Kitchen HYDROcodone-acetaminophen (NORCO/VICODIN) 5-325 MG per tablet Take 1 tablet by mouth every 6 (six) hours as needed for moderate pain.      Marland Kitchen insulin lispro (HUMALOG) 100 UNIT/ML injection Inject 2-10 Units into the skin 4 (four) times daily -  before meals and at bedtime.      Marland Kitchen levothyroxine (SYNTHROID, LEVOTHROID) 25 MCG tablet Take 25 mcg by mouth daily before breakfast.      . lidocaine-prilocaine (EMLA) cream Apply 1 application topically 3 (three) times a week. Dialysis med      . metoprolol (LOPRESSOR) 100 MG tablet Take 100 mg by mouth 2 (two) times daily.      Marland Kitchen warfarin (COUMADIN) 1 MG tablet Take 1 mg by mouth daily.      . Amino Acids-Protein Hydrolys (FEEDING SUPPLEMENT, PRO-STAT SUGAR FREE 64,) LIQD Take 30 mLs by mouth 2 (two) times daily.        Results for orders placed during the hospital encounter of 05/12/13 (from the past 48 hour(s))  GLUCOSE, CAPILLARY     Status: Abnormal   Collection Time    05/13/13  7:57 AM      Result Value Ref Range   Glucose-Capillary 138 (*) 70 - 99 mg/dL  PROTIME-INR  Status: Abnormal   Collection Time    05/13/13  9:28 AM      Result Value Ref Range   Prothrombin Time 16.7 (*) 11.6 - 15.2 seconds   INR 1.39  0.00 - 1.49  GLUCOSE, CAPILLARY     Status: Abnormal   Collection Time    05/13/13 11:37 AM      Result Value Ref Range   Glucose-Capillary 162 (*) 70 - 99 mg/dL  GLUCOSE, CAPILLARY     Status: Abnormal   Collection Time    05/13/13  6:47 PM      Result Value Ref Range   Glucose-Capillary 134 (*) 70 - 99 mg/dL  GLUCOSE, CAPILLARY     Status: Abnormal   Collection Time    05/13/13  9:58 PM      Result Value Ref Range   Glucose-Capillary 130 (*) 70 - 99 mg/dL  PROTIME-INR     Status: Abnormal   Collection Time    05/14/13  6:06 AM      Result Value Ref Range   Prothrombin Time 18.6 (*) 11.6 - 15.2 seconds   INR 1.60 (*) 0.00 - 1.49  GLUCOSE, CAPILLARY     Status: Abnormal   Collection Time    05/14/13   7:36 AM      Result Value Ref Range   Glucose-Capillary 148 (*) 70 - 99 mg/dL  GLUCOSE, CAPILLARY     Status: Abnormal   Collection Time    05/14/13  7:53 AM      Result Value Ref Range   Glucose-Capillary 108 (*) 70 - 99 mg/dL  GLUCOSE, CAPILLARY     Status: Abnormal   Collection Time    05/14/13 11:51 AM      Result Value Ref Range   Glucose-Capillary 164 (*) 70 - 99 mg/dL  GLUCOSE, CAPILLARY     Status: None   Collection Time    05/14/13  4:59 PM      Result Value Ref Range   Glucose-Capillary 97  70 - 99 mg/dL  GLUCOSE, CAPILLARY     Status: Abnormal   Collection Time    05/14/13  9:29 PM      Result Value Ref Range   Glucose-Capillary 130 (*) 70 - 99 mg/dL  PROTIME-INR     Status: Abnormal   Collection Time    05/15/13  4:54 AM      Result Value Ref Range   Prothrombin Time 18.0 (*) 11.6 - 15.2 seconds   INR 1.53 (*) 0.00 - 1.49   No results found.  Review of Systems  All other systems reviewed and are negative.    Blood pressure 110/58, pulse 77, temperature 97.4 F (36.3 C), temperature source Oral, resp. rate 18, height 5' 10.08" (1.78 m), weight 65.8 kg (145 lb 1 oz), SpO2 95.00%. Physical Exam  On examination patient has a chronic nonhealing wound over the fifth metatarsal head. 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  Plan: Will plan for at minimum a fifth ray amputation. Discussed patient he is at increased risk of the wound not healing. Patient states he understands and wished to proceed at this time.  Chassity Ludke V 05/15/2013, 6:14 AM

## 2013-05-15 NOTE — Progress Notes (Signed)
ANTIBIOTIC CONSULT NOTE - FOLLOW UP  Pharmacy Consult for Vancomycin and Zosyn Indication: osteomyelitis  Allergies  Allergen Reactions  . Ace Inhibitors Other (See Comments)    Unknown   . Lisinopril Cough    Patient Measurements: Height: 5' 10.08" (178 cm) Weight: 145 lb 1 oz (65.8 kg) (bed weight) IBW/kg (Calculated) : 73.18  Vital Signs: Temp: 97.4 F (36.3 C) (03/20 0630) Temp src: Oral (03/20 0630) BP: 128/74 mmHg (03/20 0630) Pulse Rate: 102 (03/20 0630) Intake/Output from previous day: 03/19 0701 - 03/20 0700 In: 603 [P.O.:600; I.V.:3] Out: -  Intake/Output from this shift:    Labs:  Recent Labs  05/12/13 1311 05/13/13 0534  WBC 5.1 11.7*  HGB 16.6 9.6*  PLT 141* 318  CREATININE 3.61* 4.36*   Estimated Creatinine Clearance: 14.3 ml/min (by C-G formula based on Cr of 4.36).  Recent Labs  05/12/13 1945  VANCORANDOM <5.0     Microbiology: Recent Results (from the past 720 hour(s))  CULTURE, BLOOD (ROUTINE X 2)     Status: None   Collection Time    04/20/13  7:07 PM      Result Value Ref Range Status   Specimen Description BLOOD LEFT UPPER ARM   Final   Special Requests     Final   Value: BOTTLES DRAWN AEROBIC AND ANAEROBIC RED 8CC BLUE 10CC   Culture  Setup Time     Final   Value: 04/21/2013 00:58     Performed at [facility]   Culture     Final   Value: NO GROWTH 5 DAYS     Performed at [facility]   Report Status 04/27/2013 FINAL   Final  CULTURE, BLOOD (ROUTINE X 2)     Status: None   Collection Time    04/20/13  7:20 PM      Result Value Ref Range Status   Specimen Description BLOOD LEFT ARM   Final   Special Requests     Final   Value: BOTTLES DRAWN AEROBIC AND ANAEROBIC 10 CC EACH BOTTLE   Culture  Setup Time     Final   Value: 04/21/2013 00:58     Performed at [facility]   Culture     Final   Value: NO GROWTH 5 DAYS     Performed at [facility]   Report Status 04/27/2013 FINAL   Final  MRSA  PCR SCREENING     Status: None   Collection Time    04/20/13  8:15 PM      Result Value Ref Range Status   MRSA by PCR NEGATIVE  NEGATIVE Final   Comment:            The GeneXpert MRSA Assay (FDA     approved for NASAL specimens     only), is one component of a     comprehensive MRSA colonization     surveillance program. It is not     intended to diagnose MRSA     infection nor to guide or     monitor treatment for     MRSA infections.    Anti-infectives   Start     Dose/Rate Route Frequency Ordered Stop   05/15/13 0600  ceFAZolin (ANCEF) IVPB 2 g/50 mL premix     2 g 100 mL/hr over 30 Minutes Intravenous On call to O.R. 05/13/13 0955 05/16/13 0559   05/13/13 1200  vancomycin (VANCOCIN) IVPB 750 mg/150 ml premix  750 mg 150 mL/hr over 60 Minutes Intravenous Every M-W-F (Hemodialysis) 05/12/13 2112     05/12/13 2200  vancomycin (VANCOCIN) 1,500 mg in sodium chloride 0.9 % 500 mL IVPB     1,500 mg 250 mL/hr over 120 Minutes Intravenous  Once 05/12/13 2112 05/13/13 0057   05/12/13 1800  piperacillin-tazobactam (ZOSYN) IVPB 2.25 g  Status:  Discontinued     2.25 g 100 mL/hr over 30 Minutes Intravenous 4 times per day 05/12/13 1747 05/12/13 1750   05/12/13 1730  piperacillin-tazobactam (ZOSYN) IVPB 2.25 g     2.25 g 100 mL/hr over 30 Minutes Intravenous Every 8 hours 05/12/13 1711        Assessment: 73 year old male admitted with osteomyelitis.  He continues on vanc and zosyn.  Plan OR today for minimum ray amputation.  Goal of Therapy:  preHD Vancomycin level 15-25 mcg/ml  Plan:  Vancomycin 750mg  IV after each HD session Zosyn 2.25gm IV q8h Follow for HD plans and tolerance Follow available micro data  Najeh Credit, Pharm.D. Clinical Pharmacist Phone: [phone] or [phone] 05/15/2013, 8:17 AM

## 2013-05-15 NOTE — Clinical Social Work Note (Signed)
Patient from [facility] skilled nursing facility. CSW will follow-up with patient to confirm return to facility.  Shequita Peplinski Givens, MSW, LCSW [phone]

## 2013-05-15 NOTE — Procedures (Signed)
I was present at this dialysis session. I have reviewed the session itself and made appropriate changes.   To OR this PM for amputation.  No events.  No heparin given in HD.  Pearson Grippe  MD 05/15/2013, 10:33 AM

## 2013-05-15 NOTE — Anesthesia Preprocedure Evaluation (Addendum)
Anesthesia Evaluation  Patient identified by MRN, date of birth, ID band Patient awake    Reviewed: Allergy & Precautions, H&P , NPO status , Patient's Chart, lab work & pertinent test results, reviewed documented beta blocker date and time   History of Anesthesia Complications Negative for: history of anesthetic complications  Airway Mallampati: II TM Distance: >3 FB Neck ROM: Full    Dental  (+) Teeth Intact,    Pulmonary shortness of breath and with exertion, former smoker,  breath sounds clear to auscultation        Cardiovascular hypertension, Pt. on medications and Pt. on home beta blockers + Peripheral Vascular Disease and +CHF Rhythm:Regular Rate:Normal     Neuro/Psych PSYCHIATRIC DISORDERS Anxiety    GI/Hepatic negative GI ROS, Neg liver ROS,   Endo/Other  diabetes, Type 2, Insulin DependentHypothyroidism   Renal/GU ESRF and DialysisRenal diseaseAVF right upper extremity with palpable thrill     Musculoskeletal   Abdominal   Peds  Hematology  (+) anemia ,   Anesthesia Other Findings   Reproductive/Obstetrics                         Anesthesia Physical Anesthesia Plan  ASA: III  Anesthesia Plan: General   Post-op Pain Management:    Induction: Intravenous  Airway Management Planned: LMA  Additional Equipment: None  Intra-op Plan:   Post-operative Plan: Extubation in OR  Informed Consent: I have reviewed the patients History and Physical, chart, labs and discussed the procedure including the risks, benefits and alternatives for the proposed anesthesia with the patient or authorized representative who has indicated his/her understanding and acceptance.   Dental advisory given  Plan Discussed with: CRNA and Surgeon  Anesthesia Plan Comments:         Anesthesia Quick Evaluation

## 2013-05-15 NOTE — Progress Notes (Signed)
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 Cultures from SNF with MSSA and group B strep -Appreciate Dr. Jess Barters input-surgery/ray amputation planned today 3/20 Active Problems:  End stage renal disease  -Dialysis per renal, and MWF Type 2 diabetes mellitus -Continue monitoring Accu-Cheks and follow with sliding scale insulin Hypertension -BP controlled, Continue metoprolol Atrial fibrillation  -Rate controlled on metoprolol and amiodarone -INR is subtherapeutic at 1.59 today and given the surgery as planned Friday as above will hold Coumadin. -Lovenox for DVT prophylaxis while INR is therapeutic Diastolic CHF, chronic  -Compensated -Volume control continuing with dialysis per renal Peripheral vascular disease  Hx of right BKA  Hypokalemia -again Replace K. Protein calorie malnutrition -Continue nutritional supplements  Code Status: Full code Family Communication: None at bedside Disposition Plan: Back to SNF when medically ready   Consultants:  Orthopedics  Renal  Procedures:  None  Antibiotics:  Vancomycin started 3/17  Zosyn started 3/17  HPI/Subjective: Patient seen on dialysis, denies any complaints. Awaiting surgery today  Objective: Filed Vitals:   05/15/13 1247  BP: 142/59  Pulse: 80  Temp: 98.2 F (36.8 C)  Resp: 18    Intake/Output Summary (Last 24 hours) at 05/15/13 1532 Last data filed at 05/15/13 1529  Gross per 24 hour  Intake    743 ml  Output   1030 ml  Net   -287 ml   Filed Weights   05/13/13 1817 05/15/13 0916 05/15/13 1221  Weight: 65.8 kg (145 lb 1 oz) 67.3 kg (148 lb 5.9 oz) 66.4 kg (146 lb 6.2 oz)    Exam:   General: alert & oriented x 3 In NAD Cardiovascular: RRR, nl S1 s2 Respiratory: CTAB Abdomen: soft +BS NT/ND, no masses palpable Extremities: No cyanosis and no edema, left fifth metatarsal area with gangrenous laterally, ulcer with serous drainage present.    Data Reviewed: Basic Metabolic Panel:  Recent Labs Lab 05/12/13 1311 05/13/13 0534 05/15/13 0928  NA 139 137 137  K 2.8* 3.2* 4.3  CL 95* 94* 94*  CO2 30 27 25   GLUCOSE 105* 137* 135*  BUN 18 25* 26*  CREATININE 3.61* 4.36* 4.04*  CALCIUM 8.5 8.6 9.0  PHOS  --   --  2.4   Liver Function Tests:  Recent Labs Lab 05/12/13 1311 05/15/13 0928  AST 9  --   ALT <5  --   ALKPHOS 106  --   BILITOT 0.4  --   PROT 6.0  --   ALBUMIN 2.0* 2.1*    Recent Labs Lab 05/12/13 1311  LIPASE 23   No results found for this basename: AMMONIA,  in the last 168 hours CBC:  Recent Labs Lab 05/12/13 1311 05/13/13 0534 05/15/13 0928  WBC 5.1 11.7* 15.5*  NEUTROABS 4.3  --   --   HGB 16.6 9.6* 10.3*  HCT 49.9 29.6* 31.8*  MCV 93.6 92.8 93.5  PLT 141* 318 425*   Cardiac Enzymes: No results found for this basename: CKTOTAL, CKMB, CKMBINDEX, TROPONINI,  in the last 168 hours BNP (last 3 results)  Recent Labs  05/12/13 1311  PROBNP >70000.0*   CBG:  Recent Labs Lab 05/14/13 2129 05/15/13 0821 05/15/13 1245 05/15/13 1347 05/15/13 1523  GLUCAP 130* 127* 122* 128* 140*  No results found for this or any previous visit (from the past 240 hour(s)).   Studies: Mr Foot Left Wo Contrast  05/15/2013   CLINICAL DATA:  Diabetic ulcer of left foot.  EXAM: MRI OF THE LEFT FOREFOOT WITHOUT CONTRAST  TECHNIQUE: Multiplanar, multisequence MR imaging was performed. No intravenous contrast was administered.  COMPARISON:  Radiographs dated 05/12/2013  FINDINGS: There is a deep ulceration at the lateral aspect of the fifth metatarsal phalangeal joint which exposes the head of the fifth metatarsal and the base of the proximal  phalanx and the joint. There is an ill-defined fluid collection surrounding the head of the fifth metatarsal consistent with pus. There is slight fragmentation of the plantar aspect of the base of the proximal phalanx which is probably old. There is a soft tissue fluid collection adjacent to the tip of the tuft of the little toe which probably also represents pus.  The other bones of the forefoot demonstrate no acute abnormalities. Slight degenerative changes of the first metatarsal phalangeal joint.  IMPRESSION: Septic fifth metatarsal phalangeal joint. Cellulitis of the fifth toe and surrounding the base of the fifth metatarsal. The bones are exposed at that joint and edema in the bones most likely represents osteomyelitis.   Electronically Signed   By: Rozetta Nunnery M.D.   On: 05/15/2013 08:30    Scheduled Meds: . [facility] HOLD] amiodarone  100 mg Oral BID  . [facility] HOLD] calcium acetate  2,001 mg Oral TID WC  . ceFAZolin      .  ceFAZolin (ANCEF) IV  2 g Intravenous On Call to OR  . [[facility] HOLD] darbepoetin (ARANESP) injection - DIALYSIS  100 mcg Intravenous Q Wed-HD  . [[facility] HOLD] enoxaparin (LOVENOX) injection  30 mg Subcutaneous Q24H  . [[facility] HOLD] escitalopram  10 mg Oral Daily  . [[facility] HOLD] feeding supplement (NEPRO CARB STEADY)  237 mL Oral Q24H  . [[facility] HOLD] feeding supplement (PRO-STAT SUGAR FREE 64)  30 mL Oral BID  . [facility] HOLD] gabapentin  300 mg Oral QHS  . [[facility] HOLD] insulin aspart  0-15 Units Subcutaneous TID WC  . [MAR HOLD] insulin aspart  0-5 Units Subcutaneous QHS  . [facility] HOLD] levothyroxine  25 mcg Oral QAC breakfast  . [[facility] HOLD] lidocaine-prilocaine  1 application Topical Once per day on Mon Wed Fri  . [facility] HOLD] metoprolol  100 mg Oral BID  . [[facility] HOLD] piperacillin-tazobactam (ZOSYN)  IV  2.25 g Intravenous Q8H  . [[facility] HOLD] sodium chloride  3 mL Intravenous Q12H  . [facility] HOLD] vancomycin  750 mg Intravenous Q M,W,F-HD   Continuous Infusions:   Principal Problem:   Diabetic  ulcer of left foot Active Problems:   End stage renal disease   Peripheral vascular disease   Hx of right BKA   Atrial fibrillation   Diastolic CHF, chronic   Cellulitis    Time spent: [facility] Pager [phone]. If 7PM-7AM, please contact night-coverage at [email or web address], password [facility] 05/15/2013, 3:32 PM  LOS: 3 days

## 2013-05-15 NOTE — Op Note (Addendum)
OPERATIVE REPORT  DATE OF SURGERY: 05/15/2013  PATIENT:  Chris Vance,  73 y.o. male  PRE-OPERATIVE DIAGNOSIS:  Gangrenous Ulcer Left Foot 5th metatarsal head  POST-OPERATIVE DIAGNOSIS:  Gangrenous Ulcer Left Foot 5th metatarsal head  PROCEDURE:  Procedure(s): AMPUTATION metatarsals fourth and fifth left foot  Left fourth and fifth ray amputation  SURGEON:  Surgeon(s): Newt Minion, MD  ANESTHESIA:   general  EBL:  Minimal ML  SPECIMEN:  No Specimen  TOURNIQUET:  * No tourniquets in log *  PROCEDURE DETAILS: Patient is a 73 year old gentleman with diabetic insensate neuropathy peripheral vascular disease with gangrenous ulcer beneath the fifth metatarsal head left foot. Patient is failed conservative treatment and presents at this time for fifth ray amputation. Risks and benefits were discussed including infection neurovascular injury nonhealing of the wound potential for additional amputation at the time of surgery. Patient states he understands and wished to proceed at this time. Description of procedure patient was brought to the operating room and underwent a general anesthetic. After adequate levels of anesthesia were obtained patient's left lower extremity was prepped using DuraPrep draped into a sterile field. A racquet incision was made around the toe and the necrotic wound. This involves sufficient amount of soft tissue the patient did not have enough soft tissue to cover the fourth metatarsal head. Patient underwent a fourth ray amputation as well. The wound was irrigated with normal saline patient had very small amount petechial bleeding still concerns for ischemic changes. Incision was closed without tension the skin with 2-0 nylon. The wound was covered Adaptic orthopedic sponges AB dressing Kerlix and Coban. Patient was extubated taken to the PACU in stable condition.  PLAN OF CARE: Admit to inpatient   PATIENT DISPOSITION:  PACU - hemodynamically stable.    Newt Minion, MD 05/15/2013 4:04 PM

## 2013-05-15 NOTE — Anesthesia Postprocedure Evaluation (Signed)
  Anesthesia Post-op Note  Patient: Chris Vance  Procedure(s) Performed: Procedure(s): AMPUTATION metatarsal (Left)  Patient Location: PACU  Anesthesia Type:General  Level of Consciousness: awake and alert   Airway and Oxygen Therapy: Patient Spontanous Breathing  Post-op Pain: mild  Post-op Assessment: Post-op Vital signs reviewed, Patient's Cardiovascular Status Stable, Respiratory Function Stable, Patent Airway, No signs of Nausea or vomiting and Pain level controlled  Post-op Vital Signs: Reviewed and stable  Complications: No apparent anesthesia complications

## 2013-05-15 NOTE — Transfer of Care (Signed)
Immediate Anesthesia Transfer of Care Note  Patient: Chris Vance  Procedure(s) Performed: Procedure(s): AMPUTATION metatarsal (Left)  Patient Location: PACU  Anesthesia Type:General  Level of Consciousness: awake, alert  and oriented  Airway & Oxygen Therapy: Patient Spontanous Breathing and Patient connected to face mask oxygen  Post-op Assessment: Report given to PACU RN and Post -op Vital signs reviewed and stable  Post vital signs: Reviewed and stable  Complications: No apparent anesthesia complications

## 2013-05-15 NOTE — Preoperative (Signed)
Beta Blockers   Reason not to administer Beta Blockers:Not Applicable 

## 2013-05-16 DIAGNOSIS — L089 Local infection of the skin and subcutaneous tissue, unspecified: Secondary | ICD-10-CM

## 2013-05-16 DIAGNOSIS — F411 Generalized anxiety disorder: Secondary | ICD-10-CM

## 2013-05-16 LAB — BASIC METABOLIC PANEL
BUN: 17 mg/dL (ref 6–23)
CALCIUM: 8.7 mg/dL (ref 8.4–10.5)
CHLORIDE: 97 meq/L (ref 96–112)
CO2: 26 meq/L (ref 19–32)
Creatinine, Ser: 3.14 mg/dL — ABNORMAL HIGH (ref 0.50–1.35)
GFR calc Af Amer: 21 mL/min — ABNORMAL LOW (ref >90)
GFR calc non Af Amer: 18 mL/min — ABNORMAL LOW (ref >90)
GLUCOSE: 150 mg/dL — AB (ref 70–99)
Potassium: 3.4 mEq/L — ABNORMAL LOW (ref 3.7–5.3)
Sodium: 140 mEq/L (ref 137–147)

## 2013-05-16 LAB — CBC
HEMATOCRIT: 32.4 % — AB (ref 39.0–52.0)
Hemoglobin: 10.3 g/dL — ABNORMAL LOW (ref 13.0–17.0)
MCH: 30.2 pg (ref 26.0–34.0)
MCHC: 31.8 g/dL (ref 30.0–36.0)
MCV: 95 fL (ref 78.0–100.0)
Platelets: 351 10*3/uL (ref 150–400)
RBC: 3.41 MIL/uL — AB (ref 4.22–5.81)
RDW: 16.1 % — ABNORMAL HIGH (ref 11.5–15.5)
WBC: 14.1 10*3/uL — AB (ref 4.0–10.5)

## 2013-05-16 LAB — GLUCOSE, CAPILLARY
GLUCOSE-CAPILLARY: 135 mg/dL — AB (ref 70–99)
Glucose-Capillary: 112 mg/dL — ABNORMAL HIGH (ref 70–99)
Glucose-Capillary: 157 mg/dL — ABNORMAL HIGH (ref 70–99)
Glucose-Capillary: 116 mg/dL — ABNORMAL HIGH (ref 70–99)

## 2013-05-16 LAB — PROTIME-INR
INR: 1.86 — ABNORMAL HIGH (ref 0.00–1.49)
Prothrombin Time: 20.9 seconds — ABNORMAL HIGH (ref 11.6–15.2)

## 2013-05-16 MED ORDER — POTASSIUM CHLORIDE CRYS ER 20 MEQ PO TBCR
20.0000 meq | EXTENDED_RELEASE_TABLET | Freq: Once | ORAL | Status: AC
  Administered 2013-05-16: 20 meq via ORAL
  Filled 2013-05-16: qty 1

## 2013-05-16 MED ORDER — WARFARIN SODIUM 1 MG PO TABS
1.0000 mg | ORAL_TABLET | Freq: Once | ORAL | Status: AC
  Administered 2013-05-16: 1 mg via ORAL
  Filled 2013-05-16: qty 1

## 2013-05-16 MED ORDER — WARFARIN - PHARMACIST DOSING INPATIENT: Freq: Every day | Status: DC

## 2013-05-16 MED ORDER — CALCIUM ACETATE 667 MG PO CAPS
1334.0000 mg | ORAL_CAPSULE | Freq: Three times a day (TID) | ORAL | Status: DC
  Administered 2013-05-16 – 2013-05-18 (×5): 1334 mg via ORAL
  Filled 2013-05-16 (×9): qty 2

## 2013-05-16 NOTE — Clinical Social Work Placement (Addendum)
Clinical Social Work Department CLINICAL SOCIAL WORK PLACEMENT NOTE 05/16/2013  Patient:  KIERNAN, ATKERSON  Account Number:  [id number] Admit date:  05/12/2013  Clinical Social Worker:  Carrington Clamp, LCSWA  Date/time:  05/16/2013 12:51 PM  Clinical Social Work is seeking post-discharge placement for this patient at the following level of care:   SKILLED NURSING   (*CSW will update this form in Epic as items are completed)   05/16/2013  Patient/family provided with [place] Department of Clinical Social Work's list of facilities offering this level of care within the geographic area requested by the patient (or if unable, by the patient's family).  05/16/2013  Patient/family informed of their freedom to choose among providers that offer the needed level of care, that participate in Medicare, Medicaid or managed care program needed by the patient, have an available bed and are willing to accept the patient.  05/16/2013  Patient/family informed of [facility]' ownership interest in [facility] as well as of the fact that they are under no obligation to receive care at this facility.  PASARR submitted to EDS on 02/04/2013 PASARR number received from EDS on 02/04/2013 - [id number]  FL2 transmitted to all facilities in geographic area requested by pt/family on  05/16/2013 FL2 transmitted to all facilities within larger geographic area on   Patient informed that his/her managed care company has contracts with or will negotiate with  certain facilities, including the following:     Patient/family informed of bed offers received:   Patient chooses bed at [facility] Physician recommends and patient chooses bed at    Patient to be transferred to [place] on 05/22/13  Patient to be transferred to facility by ambulance  The following physician request were entered in Epic:   Additional Comments:  Chiropodist,  [place] Weekend Clinical Social Worker [phone]

## 2013-05-16 NOTE — Clinical Social Work Psychosocial (Addendum)
Clinical Social Work Department BRIEF PSYCHOSOCIAL ASSESSMENT 05/16/2013  Patient:  Chris Vance, Chris Vance     Account Number:  [id number]     Admit date:  05/12/2013  Clinical Social Worker:  Hubert Azure  Date/Time:  05/16/2013 12:13 PM  Referred by:  Physician  Date Referred:  05/16/2013 Referred for  SNF Placement   Other Referral:   Interview type:  Patient Other interview type:    PSYCHOSOCIAL DATA Living Status:  FACILITY Admitted from facility:  [facility] Level of care:  [place] Primary support name:  Chris Vance Primary support relationship to patient:  CHILD, ADULT Degree of support available:   Good    CURRENT CONCERNS Current Concerns  Post-Acute Placement   Other Concerns:    SOCIAL WORK ASSESSMENT / PLAN Clinical Social Worker (CSW) met with patient who was alert and oriented x4 at bedside. CSW introduced self and explained role. CSW discussed d/c plan with patient. Patient reported he was residing at home alone, until one day he was found unconscious by police during a wellness check. Patient stated since the incident, he has been residing at [facility], and does not desire to return to the facility. Patient reported his son and brother are his support system. CSW verbalized understanding of the above and provided patient with [place] list. Patient reported he would review the list and follow-up with CSW.   Assessment/plan status:  Information/Referral to [employer] Other assessment/ plan:   Information/referral to community resources:   CSW provided patient with [facility] SNF community list.    PATIENT'S/FAMILY'S RESPONSE TO PLAN OF CARE: Patient thanked CSW for her time and stated he was agreeable to d/c plan to any SNF besides [facility]. Patient reported he preferred to review SNF community list provided to him and follow-up with CSW with choices at a later time.     [place], [place] Weekend Clinical Social Worker [phone]

## 2013-05-16 NOTE — Progress Notes (Signed)
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 Cultures from SNF with MSSA and group B strep -s/p-surgery/ray amputation 3/20 -ortho follow, to advise when ok to dc from their standpoint Active Problems:  End stage renal disease  -Dialysis per renal, and MWF Type 2 diabetes mellitus -Continue monitoring Accu-Cheks and follow with sliding scale insulin Hypertension -BP controlled, Continue metoprolol Atrial fibrillation  -Rate controlled on metoprolol and amiodarone -INR trending up, Coumadin resumed today- per Dr Erlinda Hong ok to resume. -Lovenox for DVT prophylaxis while INR is subtherapeutic Diastolic CHF, chronic  -Compensated -Volume control continuing with dialysis per renal Peripheral vascular disease  Hx of right BKA  Hypokalemia -again Replace K. Protein calorie malnutrition -Continue nutritional supplements  Code Status: Full code Family Communication: None at bedside Disposition Plan: Back to SNF when medically ready   Consultants:  Orthopedics  Renal  Procedures:  None  Antibiotics:  Vancomycin started 3/17  Zosyn started 3/17  HPI/Subjective: denies any complaints.  Objective: Filed Vitals:   05/16/13 1400  BP: 136/65  Pulse: 88  Temp: 98 F (36.7 C)  Resp: 18   No intake or output data in the 24 hours ending 05/16/13 1648 Filed Weights   05/13/13 1817 05/15/13 0916 05/15/13 1221  Weight: 65.8 kg (145 lb 1 oz) 67.3 kg (148 lb 5.9 oz) 66.4 kg (146 lb 6.2 oz)    Exam:  General: alert & oriented x 3 In NAD Cardiovascular: RRR, nl S1 s2 Respiratory: CTAB Abdomen: soft +BS NT/ND, no masses palpable Extremities: No cyanosis and no  edema, left foot with dressing dressing clean and dry   Data Reviewed: Basic Metabolic Panel:  Recent Labs Lab 05/12/13 1311 05/13/13 0534 05/15/13 0928 05/16/13 0600  NA 139 137 137 140  K 2.8* 3.2* 4.3 3.4*  CL 95* 94* 94* 97  CO2 30 27 25 26   GLUCOSE 105* 137* 135* 150*  BUN 18 25* 26* 17  CREATININE 3.61* 4.36* 4.04* 3.14*  CALCIUM 8.5 8.6 9.0 8.7  PHOS  --   --  2.4  --    Liver Function Tests:  Recent Labs Lab 05/12/13 1311 05/15/13 0928  AST 9  --   ALT <5  --   ALKPHOS 106  --   BILITOT 0.4  --   PROT 6.0  --   ALBUMIN 2.0* 2.1*    Recent Labs Lab 05/12/13 1311  LIPASE 23   No results found for this basename: AMMONIA,  in the last 168 hours CBC:  Recent Labs Lab 05/12/13 1311 05/13/13 0534 05/15/13 0928 05/16/13 0600  WBC 5.1 11.7* 15.5* 14.1*  NEUTROABS 4.3  --   --   --   HGB 16.6 9.6* 10.3* 10.3*  HCT 49.9 29.6* 31.8* 32.4*  MCV 93.6 92.8 93.5 95.0  PLT 141* 318 425* 351   Cardiac Enzymes: No results found for this basename: CKTOTAL, CKMB, CKMBINDEX, TROPONINI,  in the last 168 hours BNP (last 3 results)  Recent Labs  05/12/13 1311  PROBNP >70000.0*   CBG:  Recent Labs Lab 05/15/13 1347 05/15/13 1523 05/15/13 1610 05/15/13 2308 05/16/13 1253  GLUCAP 128* 140* 129* 112* 157*    No results found for this or any previous visit (from the past 240 hour(s)).  Studies: Mr Foot Left Wo Contrast  05/15/2013   CLINICAL DATA:  Diabetic ulcer of left foot.  EXAM: MRI OF THE LEFT FOREFOOT WITHOUT CONTRAST  TECHNIQUE: Multiplanar, multisequence MR imaging was performed. No intravenous contrast was administered.  COMPARISON:  Radiographs dated 05/12/2013  FINDINGS: There is a deep ulceration at the lateral aspect of the fifth metatarsal phalangeal joint which exposes the head of the fifth metatarsal and the base of the proximal phalanx and the joint. There is an ill-defined fluid collection surrounding the head of the fifth metatarsal  consistent with pus. There is slight fragmentation of the plantar aspect of the base of the proximal phalanx which is probably old. There is a soft tissue fluid collection adjacent to the tip of the tuft of the little toe which probably also represents pus.  The other bones of the forefoot demonstrate no acute abnormalities. Slight degenerative changes of the first metatarsal phalangeal joint.  IMPRESSION: Septic fifth metatarsal phalangeal joint. Cellulitis of the fifth toe and surrounding the base of the fifth metatarsal. The bones are exposed at that joint and edema in the bones most likely represents osteomyelitis.   Electronically Signed   By: Rozetta Nunnery M.D.   On: 05/15/2013 08:30    Scheduled Meds: . amiodarone  100 mg Oral BID  . calcium acetate  1,334 mg Oral TID WC  . darbepoetin (ARANESP) injection - DIALYSIS  100 mcg Intravenous Q Wed-HD  . enoxaparin (LOVENOX) injection  30 mg Subcutaneous Q24H  . escitalopram  10 mg Oral Daily  . feeding supplement (NEPRO CARB STEADY)  237 mL Oral Q24H  . feeding supplement (PRO-STAT SUGAR FREE 64)  30 mL Oral BID  . gabapentin  300 mg Oral QHS  . insulin aspart  0-15 Units Subcutaneous TID WC  . insulin aspart  0-5 Units Subcutaneous QHS  . levothyroxine  25 mcg Oral QAC breakfast  . lidocaine-prilocaine  1 application Topical Once per day on Mon Wed Fri  . metoprolol  100 mg Oral BID  . piperacillin-tazobactam (ZOSYN)  IV  2.25 g Intravenous Q8H  . sodium chloride  3 mL Intravenous Q12H  . vancomycin  750 mg Intravenous Q M,W,F-HD  . warfarin  1 mg Oral ONCE-1800  . Warfarin - Pharmacist Dosing Inpatient   Does not apply q1800   Continuous Infusions:   Principal Problem:   Diabetic ulcer of left foot Active Problems:   End stage renal disease   Peripheral vascular disease   Hx of right BKA   Atrial fibrillation   Diastolic CHF, chronic   Cellulitis    Time spent: [place] [facility] Pager [phone].  If 7PM-7AM, please contact night-coverage at [email or web address], password [facility] 05/16/2013, 4:48 PM  LOS: 4 days

## 2013-05-16 NOTE — Progress Notes (Signed)
[place] for lovenox/warfarin Indication: VTE px while INR <2.0, Afib  Allergies  Allergen Reactions  . Ace Inhibitors Other (See Comments)    Unknown   . Lisinopril Cough    Patient Measurements: Height: 5' 10.08" (178 cm) Weight: 146 lb 6.2 oz (66.4 kg) IBW/kg (Calculated) : 73.18  Vital Signs: Temp: 97.2 F (36.2 C) (03/21 0528) Temp src: Oral (03/21 0528) BP: 134/61 mmHg (03/21 0528) Pulse Rate: 82 (03/21 0528)  Labs:  Recent Labs  05/14/13 0606 05/15/13 0454 05/15/13 0928 05/16/13 0600  HGB  --   --  10.3* 10.3*  HCT  --   --  31.8* 32.4*  PLT  --   --  425* 351  LABPROT 18.6* 18.0*  --  20.9*  INR 1.60* 1.53*  --  1.86*  CREATININE  --   --  4.04* 3.14*    Estimated Creatinine Clearance: 20 ml/min (by C-G formula based on Cr of 3.14).   Assessment: 73 yo M with DM and ESRD/HD MWF. Warfarin dose from nursing home 1mg  daily.  His coumadin for afib held for 3 days for surgery on Friday.  Now S/P toe amputation to resume coumadin today.  INR today 1.86 increased despite no coumadin for 3 days. Will give usual home dose since INR already increased. Continue VTE px Lovenox while INR <2.0. No bleeding noted.  Goal of Therapy:  INR 2-3 Monitor platelets by anticoagulation protocol: Yes   Plan:  -Lovenox 30 mg sq q24 hours -Warfarin 1 mg tonight -daily INRs, CBC Q3 days while on lovenox -monitor for s/s of bleeding  Thank you, Vivia Ewing, PharmD Clinical Pharmacist - Resident Pager: [phone] Pharmacy: [phone] 05/16/2013 12:27 PM

## 2013-05-16 NOTE — Evaluation (Signed)
Physical Therapy Evaluation Patient Details Name: Chris Vance MRN: [id number] DOB: 12/09/1940 Today's Date: 05/16/2013 Time: 5465-0354 PT Time Calculation (min): 16 min  PT Assessment / Plan / Recommendation History of Present Illness  Chris Vance is a 73 y.o. 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 Sharol Given of orthopedic surgery. He is presently a nursing home resident, transferred to the emergent apartment at [facility] for further evaluation of a left foot ulcer.  Patient with osteomyelitis.  Is s/p Lt foot 5th toe and ray amputation - NWB LLE.  Clinical Impression  Patient presents with problems listed below.  Will benefit from acute PT for strengthening, mobility/transfer training while NWB on LLE.  Spoke with RN regarding using lift equipment for OOB.  Recommend SNF at discharge for continued therapy.    PT Assessment  Patient needs continued PT services    Follow Up Recommendations  SNF    Does the patient have the potential to tolerate intense rehabilitation      Barriers to Discharge Decreased caregiver support      Equipment Recommendations  Wheelchair (measurements PT);Wheelchair cushion (measurements PT)    Recommendations for Other Services     Frequency Min 2X/week    Precautions / Restrictions Precautions Precautions: Fall Precaution Comments: Patient reports he has fallen twice at the SNF. Restrictions Weight Bearing Restrictions: Yes LLE Weight Bearing: Non weight bearing   Pertinent Vitals/Pain       Mobility  Bed Mobility Overal bed mobility: Needs Assistance Bed Mobility: Supine to Sit;Sit to Supine Supine to sit: Min assist Sit to supine: Min assist General bed mobility comments: Verbal cues for technique.  Assist to  bring trunk to sitting position.  Once upright, patient able to maintain sitting balance with min guard assist.  Attempted to have patient scoot backward using bil. UE's.  Instead, patient returned to supine.  Assist to control trunk descent to bed.    Exercises     PT Diagnosis: Generalized weakness;Altered mental status  PT Problem List: Decreased strength;Decreased activity tolerance;Decreased balance;Decreased mobility;Decreased cognition;Decreased safety awareness;Impaired sensation PT Treatment Interventions: DME instruction;Functional mobility training;Therapeutic exercise;Balance training;Cognitive remediation;Patient/family education     PT Goals(Current goals can be found in the care plan section) Acute Rehab PT Goals Patient Stated Goal: None stated today PT Goal Formulation: With patient Time For Goal Achievement: 05/30/13 Potential to Achieve Goals: Fair  Visit Information  Last PT Received On: 05/16/13 Assistance Needed: +2 History of Present Illness: Chris Vance is a 73 y.o. 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 Sharol Given of orthopedic surgery. He is presently a nursing home resident, transferred to the emergent apartment at [facility] for further evaluation of a left foot ulcer.  Patient with osteomyelitis.  Is s/p Lt foot 5th toe and ray amputation - NWB LLE.       Prior [place] expects to be discharged to:: Skilled nursing facility Prior Function Level of Independence: Needs assistance Gait / Transfers Assistance Needed: Transfers with assist Communication Communication: No difficulties Dominant Hand: Left    Cognition  Cognition Arousal/Alertness: Awake/alert Behavior During Therapy: [facility] for tasks  assessed/performed Overall Cognitive Status: Impaired/Different from baseline Area of Impairment: Orientation;Memory;Following commands;Safety/judgement Orientation Level: Disoriented to;Time;Situation;Place (Initially states he is at [place]. ) Following Commands: Follows one step commands inconsistently (Needs repeated commands/cues to perform tasks) Safety/Judgement: Decreased awareness of safety General Comments: Discussed with patient NWB status on LLE - not able to stand.  After this conversation, he asked nursing about standing to get to chair.  Initially disoriented to place.  Once corrected, he states "Oh yeah, for the surgery".  Reports he fell x2 at the SNF "doing stupid things I shouldn't have done"    Extremity/Trunk Assessment Upper Extremity Assessment Upper Extremity Assessment: Generalized weakness Lower Extremity Assessment Lower Extremity Assessment: RLE deficits/detail;LLE deficits/detail RLE Deficits / Details: BKA. Noted decreased knee extension of about 15*. LLE Deficits / Details: Strength grossly 3/5 at hip/knee.  LLE Sensation: decreased light touch (from knee down)   Balance Balance Overall balance assessment: Needs assistance Sitting-balance support: Single extremity supported Sitting balance-Leahy Scale: Fair Sitting balance - Comments: Patient sat x 7 minutes. Postural control: Posterior lean (Flexed posture)  End of Session PT - End of Session Equipment Utilized During Treatment: Oxygen Activity Tolerance: Patient limited by fatigue Patient left: in bed;with call bell/phone within reach;with bed alarm set;with nursing/sitter in room Nurse Communication: Mobility status;Need for lift equipment (Need to be cleaned - BM)  GP     Despina Pole 05/16/2013, 2:12 PM Carita Pian. Sanjuana Kava, [place] Pager [phone]

## 2013-05-16 NOTE — Progress Notes (Signed)
Loss of IV access last night. Unsuccessful attempts to restart by floor RN and IV team. Pt now refusing IV. Zosyn scheduled. Message sent to MD. Continue to monitor. Manya Silvas, RN

## 2013-05-16 NOTE — Clinical Social Work Note (Signed)
CSW spoke with patient's son, Brodrick Curran. Per son, patient's home is not habitable, but patient refuses to reside with any family members. Patient's son stated he desires for patient to transfer to ALF post SNF d/c. CSW verbalized understanding of the above and will continue to be available as needs arise.   [place], [place] Weekend Clinical Social Worker [phone]

## 2013-05-16 NOTE — Progress Notes (Signed)
   Subjective:  Patient reports no events.  Objective:   VITALS:   Filed Vitals:   05/15/13 1554 05/15/13 1708 05/15/13 1900 05/16/13 0528  BP: 131/64 129/62 125/57 134/61  Pulse: 84 87 87 82  Temp: 97.8 F (36.6 C) 97.1 F (36.2 C) 97.7 F (36.5 C) 97.2 F (36.2 C)  TempSrc:  Oral Oral Oral  Resp:  18 16 16   Height:      Weight:      SpO2: 98% 100% 98% 95%    Dressing intact. no drainage   Lab Results  Component Value Date   WBC 14.1* 05/16/2013   HGB 10.3* 05/16/2013   HCT 32.4* 05/16/2013   MCV 95.0 05/16/2013   PLT 351 05/16/2013     Assessment/Plan: 1 Day Post-Op   Problem List Items Addressed This Visit     Cardiovascular and Mediastinum   Peripheral vascular disease (Chronic)   Relevant Medications      amiodarone (PACERONE) tablet 100 mg      metoprolol (LOPRESSOR) tablet 100 mg      warfarin (COUMADIN) tablet 1 mg (Completed)      enoxaparin (LOVENOX) injection 30 mg   Diastolic CHF, chronic (Chronic)   Atrial fibrillation     Endocrine   *Diabetic ulcer of left foot   Relevant Medications      insulin lispro (HUMALOG) 100 UNIT/ML injection      insulin aspart (novoLOG) injection 0-15 Units      insulin aspart (novoLOG) injection 0-5 Units     Genitourinary   End stage renal disease     Other   Anemia of chronic renal failure (Chronic)   Relevant Medications      darbepoetin (ARANESP) injection 100 mcg      darbepoetin (ARANESP) 100 MCG/0.5ML injection (Completed)   Open wound of left foot   Hx of right BKA   Cellulitis    Other Visit Diagnoses   Hypokalemia    -  Primary    Pulmonary edema        Renal failure        Foot infection        Relevant Medications       vancomycin (VANCOCIN) IVPB 750 mg/150 ml premix       Expected postop acute blood loss anemia - will monitor for symptoms Up with PT/OT DVT ppx - SCDs, ambulation NWB left lower extremity Pain control Discharge planning   Marianna Payment 05/16/2013, 8:51  AM [phone]

## 2013-05-16 NOTE — Progress Notes (Signed)
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  EDW 68.5kg HD Bath 2k,2.5 ca Time 3hr 62min Heparin 7,000. Access R FA AVF BFR 450 DFR af 1.5  Hectorol 0 mcg IV/HD Epogen 2600 Units IV/HD Venofer 0  Other 0  Assessment/Plan: 1. s/p gangrenous left 5th toe - Dr. Sharol Given 3/20 - IV Vanc and Zosyn 2. ESRD - MWF K 3.4 - low due to HD yesterday on 2 K bath with little intake due to surgery 3. Anemia - Hgb 10.3; Aranesp 100 q Wed 4. Secondary hyperparathyroidism - P 2.4 - decrease phoslo to 2 ac 5. HTN/volume - controlled 6. Nutrition -  Renal diet + vit and prostat 7. Afib - BB, amio and coumadin previously - to be resumed; on lovenox now 8. DM/ hypothyroidism - usual meds  Myriam Jacobson, PA-C [place] [phone] 05/16/2013,9:38 AM  LOS: 4 days  Renal Attending: Agree with evaluation, assessment and plan as articulated above. Dana Dorner C   Additional Objective Labs: Basic Metabolic Panel:  Recent Labs Lab 05/13/13 0534 05/15/13 0928 05/16/13 0600  NA 137 137 140  K 3.2* 4.3 3.4*  CL 94* 94* 97  CO2 27 25 26   GLUCOSE 137* 135* 150*  BUN 25* 26* 17  CREATININE 4.36* 4.04* 3.14*  CALCIUM 8.6 9.0 8.7  PHOS  --  2.4  --    Liver Function Tests:  Recent Labs Lab 05/12/13 1311  05/15/13 0928  AST 9  --   ALT <5  --   ALKPHOS 106  --   BILITOT 0.4  --   PROT 6.0  --   ALBUMIN 2.0* 2.1*    Recent Labs Lab 05/12/13 1311  LIPASE 23   CBC:  Recent Labs Lab 05/12/13 1311 05/13/13 0534 05/15/13 0928 05/16/13 0600  WBC 5.1 11.7* 15.5* 14.1*  NEUTROABS 4.3  --   --   --   HGB 16.6 9.6* 10.3* 10.3*  HCT 49.9 29.6* 31.8* 32.4*  MCV 93.6 92.8 93.5 95.0  PLT 141* 318 425* 351  CBG:  Recent Labs Lab 05/15/13 1245 05/15/13 1347 05/15/13 1523 05/15/13 1610 05/15/13 2308  GLUCAP 122* 128* 140* 129* 112*  Studies/Results: Mr Foot Left Wo Contrast  05/15/2013   CLINICAL DATA:  Diabetic ulcer of left foot.  EXAM: MRI OF THE LEFT FOREFOOT WITHOUT CONTRAST  TECHNIQUE: Multiplanar, multisequence MR imaging was performed. No intravenous contrast was administered.  COMPARISON:  Radiographs dated 05/12/2013  FINDINGS: There is a deep ulceration at the lateral aspect of the fifth metatarsal phalangeal joint which exposes the head of the fifth metatarsal and the base of the proximal phalanx and the joint. There is an ill-defined fluid collection  surrounding the head of the fifth metatarsal consistent with pus. There is slight fragmentation of the plantar aspect of the base of the proximal phalanx which is probably old. There is a soft tissue fluid collection adjacent to the tip of the tuft of the little toe which probably also represents pus.  The other bones of the forefoot demonstrate no acute abnormalities. Slight degenerative changes of the first metatarsal phalangeal joint.  IMPRESSION: Septic fifth metatarsal phalangeal joint. Cellulitis of the fifth toe and surrounding the base of the fifth metatarsal. The bones are exposed at that joint and edema in the bones most likely represents osteomyelitis.   Electronically Signed   By: Rozetta Nunnery M.D.   On: 05/15/2013 08:30   Medications:   . amiodarone  100 mg Oral BID  . calcium acetate  2,001 mg Oral TID WC  .  darbepoetin (ARANESP) injection - DIALYSIS  100 mcg Intravenous Q Wed-HD  . enoxaparin (LOVENOX) injection  30 mg Subcutaneous Q24H  . escitalopram  10 mg Oral Daily  . feeding supplement (NEPRO CARB STEADY)  237 mL Oral Q24H  . feeding supplement (PRO-STAT SUGAR FREE 64)  30 mL Oral BID  . gabapentin  300 mg Oral QHS  . insulin aspart  0-15 Units Subcutaneous TID WC  . insulin aspart  0-5 Units Subcutaneous QHS  . levothyroxine  25 mcg Oral QAC breakfast  . lidocaine-prilocaine  1 application Topical Once per day on Mon Wed Fri  . metoprolol  100 mg Oral BID  . piperacillin-tazobactam (ZOSYN)  IV  2.25 g Intravenous Q8H  . sodium chloride  3 mL Intravenous Q12H  . vancomycin  750 mg Intravenous Q M,W,F-HD

## 2013-05-17 LAB — BASIC METABOLIC PANEL
BUN: 29 mg/dL — ABNORMAL HIGH (ref 6–23)
CHLORIDE: 98 meq/L (ref 96–112)
CO2: 27 mEq/L (ref 19–32)
Calcium: 8.7 mg/dL (ref 8.4–10.5)
Creatinine, Ser: 4.25 mg/dL — ABNORMAL HIGH (ref 0.50–1.35)
GFR calc non Af Amer: 13 mL/min — ABNORMAL LOW (ref >90)
GFR, EST AFRICAN AMERICAN: 15 mL/min — AB (ref >90)
Glucose, Bld: 127 mg/dL — ABNORMAL HIGH (ref 70–99)
POTASSIUM: 3.3 meq/L — AB (ref 3.7–5.3)
Sodium: 140 mEq/L (ref 137–147)

## 2013-05-17 LAB — PROTIME-INR
INR: 1.83 — ABNORMAL HIGH (ref 0.00–1.49)
Prothrombin Time: 20.6 seconds — ABNORMAL HIGH (ref 11.6–15.2)

## 2013-05-17 LAB — GLUCOSE, CAPILLARY
Glucose-Capillary: 125 mg/dL — ABNORMAL HIGH (ref 70–99)
Glucose-Capillary: 201 mg/dL — ABNORMAL HIGH (ref 70–99)
Glucose-Capillary: 154 mg/dL — ABNORMAL HIGH (ref 70–99)
Glucose-Capillary: 97 mg/dL (ref 70–99)

## 2013-05-17 MED ORDER — POTASSIUM CHLORIDE CRYS ER 20 MEQ PO TBCR
20.0000 meq | EXTENDED_RELEASE_TABLET | Freq: Once | ORAL | Status: AC
  Administered 2013-05-17: 20 meq via ORAL
  Filled 2013-05-17: qty 1

## 2013-05-17 MED ORDER — POTASSIUM CHLORIDE CRYS ER 20 MEQ PO TBCR: 20.0000 meq | EXTENDED_RELEASE_TABLET | Freq: Once | ORAL | Status: DC

## 2013-05-17 MED ORDER — HYDROCODONE-ACETAMINOPHEN 5-325 MG PO TABS
1.0000 | ORAL_TABLET | Freq: Three times a day (TID) | ORAL | Status: DC | PRN
  Filled 2013-05-17: qty 1

## 2013-05-17 MED ORDER — POTASSIUM CHLORIDE CRYS ER 20 MEQ PO TBCR: 40.0000 meq | EXTENDED_RELEASE_TABLET | Freq: Once | ORAL | Status: DC

## 2013-05-17 MED ORDER — WARFARIN SODIUM 1 MG PO TABS
1.0000 mg | ORAL_TABLET | Freq: Once | ORAL | Status: AC
  Administered 2013-05-17: 1 mg via ORAL
  Filled 2013-05-17: qty 1

## 2013-05-17 NOTE — Progress Notes (Signed)
[facility] Progress Note  Subjective:   You got to do something about my skin. RN tells me he dislikes prostat but drank 2 nepro yesterday  Objective Filed Vitals:   05/16/13 0528 05/16/13 1400 05/16/13 2046 05/17/13 0438  BP: 134/61 136/65 126/59 128/54  Pulse: 82 88 50 81  Temp: 97.2 F (36.2 C) 98 F (36.7 C) 97.6 F (36.4 C) 97.6 F (36.4 C)  TempSrc: Oral Oral Oral Oral  Resp: 16 18 16 16   Height:      Weight:      SpO2: 95% 94% 99% 98%   Physical Exam General: alert Heart: irreg irreg Lungs: crackles at bases Abdomen: soft NT Extremities: left foot wrapped; right BKA with scabbed areas on stump Dialysis Access: Right lower AVF + bruit with skin tear when I took off dressing  Dialysis Orders: Center: [facility] on MWF .  EDW 68.5kg HD Bath 2k,2.5 ca Time 3hr 69min Heparin 7,000. Access R FA AVF BFR 450 DFR af 1.5  Hectorol 0 mcg IV/HD Epogen 2600 Units IV/HD Venofer 0   Assessment/Plan:  1. s/p gangrenous left 5th toe - Dr. Sharol Given 3/20 - IV Vanc and Zosyn / duration 2. ESRD - MWF K 3.3 - use 4 k bath; given afib hx will hive 20 meQ KCl today (also received Saturday); discussed with RN the need to removed access dressings the day following HD exp with such fragile skin.- tight heparin  3. Anemia - Hgb 10.3; Aranesp 100 q Wed  4. Secondary hyperparathyroidism - P 2.4 - decrease phoslo to 2 ac - recheck Monday 5. HTN/volume - controlled  6. Nutrition - Alb 2.1 Renal diet + vit; since he is drinking 2 Nepro a da, will d/c prostat b/c he doesn't like it  7. Afib - BB, amio and coumadin being titrated by pharmacy 8. DM/ hypothyroidism - usual meds 9. Disp - to return to SNF; have schedule for first round HD Monday in anticipation of possible d/c  Myriam Jacobson, PA-C [place] [phone] 05/17/2013,8:46 AM  LOS: 5 days    Additional Objective Labs: Basic Metabolic Panel:  Recent Labs Lab 05/15/13 0928 05/16/13 0600  05/17/13 0530  NA 137 140 140  K 4.3 3.4* 3.3*  CL 94* 97 98  CO2 25 26 27   GLUCOSE 135* 150* 127*  BUN 26* 17 29*  CREATININE 4.04* 3.14* 4.25*  CALCIUM 9.0 8.7 8.7  PHOS 2.4  --   --    Liver Function Tests:  Recent Labs Lab 05/12/13 1311 05/15/13 0928  AST 9  --   ALT <5  --   ALKPHOS 106  --   BILITOT 0.4  --   PROT 6.0  --   ALBUMIN 2.0* 2.1*    Recent Labs Lab 05/12/13 1311  LIPASE 23   CBC:  Recent Labs Lab 05/12/13 1311 05/13/13 0534 05/15/13 0928 05/16/13 0600  WBC 5.1 11.7* 15.5* 14.1*  NEUTROABS 4.3  --   --   --   HGB 16.6 9.6* 10.3* 10.3*  HCT 49.9 29.6* 31.8* 32.4*  MCV 93.6 92.8 93.5 95.0  PLT 141* 318 425* 351   Lab Results  Component Value Date   INR 1.83* 05/17/2013   INR 1.86* 05/16/2013   INR 1.53* 05/15/2013   CBG:  Recent Labs Lab 05/15/13 2308 05/16/13 1253 05/16/13 1803 05/16/13 2148 05/17/13 0749  GLUCAP 112* 157* 135* 116* 125*  Medications:   . amiodarone  100 mg Oral BID  .  calcium acetate  1,334 mg Oral TID WC  . darbepoetin (ARANESP) injection - DIALYSIS  100 mcg Intravenous Q Wed-HD  . enoxaparin (LOVENOX) injection  30 mg Subcutaneous Q24H  . escitalopram  10 mg Oral Daily  . feeding supplement (NEPRO CARB STEADY)  237 mL Oral Q24H  . feeding supplement (PRO-STAT SUGAR FREE 64)  30 mL Oral BID  . gabapentin  300 mg Oral QHS  . insulin aspart  0-15 Units Subcutaneous TID WC  . insulin aspart  0-5 Units Subcutaneous QHS  . levothyroxine  25 mcg Oral QAC breakfast  . lidocaine-prilocaine  1 application Topical Once per day on Mon Wed Fri  . metoprolol  100 mg Oral BID  . piperacillin-tazobactam (ZOSYN)  IV  2.25 g Intravenous Q8H  . sodium chloride  3 mL Intravenous Q12H  . vancomycin  750 mg Intravenous Q M,W,F-HD  . warfarin  1 mg Oral ONCE-1800  . Warfarin - Pharmacist Dosing Inpatient   Does not apply [phone]

## 2013-05-17 NOTE — Progress Notes (Signed)
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 Cultures from SNF with MSSA and group B strep -s/p-surgery/ray amputation 3/20 -ortho follow, to advise when ok to dc from their standpoint Active Problems:  End stage renal disease  -Dialysis per renal, and MWF Type 2 diabetes mellitus -Continue monitoring Accu-Cheks and follow with sliding scale insulin Hypertension -BP controlled, Continue metoprolol Atrial fibrillation  -Rate controlled on metoprolol and amiodarone -INR trending up, Coumadin resumed today- per Dr Erlinda Hong ok to resume. -Lovenox for DVT prophylaxis while INR is subtherapeutic Diastolic CHF, chronic  -Compensated -Volume control continuing with dialysis per renal Peripheral vascular disease  Hx of right BKA  Hypokalemia -again Replace K. Protein calorie malnutrition -Continue nutritional supplements  Code Status: Full code Family Communication: None at bedside Disposition Plan: Back to SNF when medically ready   Consultants:  Orthopedics  Renal  Procedures:  None  Antibiotics:  Vancomycin started 3/17  Zosyn started 3/17  HPI/Subjective: denies any complaints.  Objective: Filed Vitals:   05/17/13 1100  BP: 120/57  Pulse: 85  Temp: 97.6 F (36.4 C)  Resp: 18    Intake/Output Summary (Last 24 hours) at 05/17/13 1442 Last data filed at 05/17/13 1136  Gross per 24 hour  Intake    663 ml  Output      0 ml  Net    663 ml   Filed Weights   05/13/13 1817 05/15/13 0916 05/15/13 1221  Weight: 65.8 kg (145 lb 1 oz) 67.3 kg (148 lb 5.9 oz) 66.4 kg (146 lb 6.2 oz)    Exam:  General: alert & oriented x 3 In NAD Cardiovascular: RRR, nl  S1 s2 Respiratory: CTAB Abdomen: soft +BS NT/ND, no masses palpable Extremities: No cyanosis and no edema, left foot with dressing dressing clean and dry   Data Reviewed: Basic Metabolic Panel:  Recent Labs Lab 05/12/13 1311 05/13/13 0534 05/15/13 0928 05/16/13 0600 05/17/13 0530  NA 139 137 137 140 140  K 2.8* 3.2* 4.3 3.4* 3.3*  CL 95* 94* 94* 97 98  CO2 30 27 25 26 27   GLUCOSE 105* 137* 135* 150* 127*  BUN 18 25* 26* 17 29*  CREATININE 3.61* 4.36* 4.04* 3.14* 4.25*  CALCIUM 8.5 8.6 9.0 8.7 8.7  PHOS  --   --  2.4  --   --    Liver Function Tests:  Recent Labs Lab 05/12/13 1311 05/15/13 0928  AST 9  --   ALT <5  --   ALKPHOS 106  --   BILITOT 0.4  --   PROT 6.0  --   ALBUMIN 2.0* 2.1*    Recent Labs Lab 05/12/13 1311  LIPASE 23   No results found for this basename: AMMONIA,  in the last 168 hours CBC:  Recent Labs Lab 05/12/13 1311 05/13/13 0534 05/15/13 0928 05/16/13 0600  WBC 5.1 11.7* 15.5* 14.1*  NEUTROABS 4.3  --   --   --   HGB 16.6 9.6* 10.3* 10.3*  HCT 49.9 29.6* 31.8* 32.4*  MCV 93.6 92.8 93.5 95.0  PLT 141* 318 425* 351   Cardiac Enzymes: No results found for this basename: CKTOTAL, CKMB, CKMBINDEX, TROPONINI,  in the last 168 hours BNP (last 3 results)  Recent Labs  05/12/13  1311  PROBNP >70000.0*   CBG:  Recent Labs Lab 05/16/13 1253 05/16/13 1803 05/16/13 2148 05/17/13 0749 05/17/13 1142  GLUCAP 157* 135* 116* 125* 201*    No results found for this or any previous visit (from the past 240 hour(s)).   Studies: No results found.  Scheduled Meds: . amiodarone  100 mg Oral BID  . calcium acetate  1,334 mg Oral TID WC  . darbepoetin (ARANESP) injection - DIALYSIS  100 mcg Intravenous Q Wed-HD  . enoxaparin (LOVENOX) injection  30 mg Subcutaneous Q24H  . escitalopram  10 mg Oral Daily  . feeding supplement (NEPRO CARB STEADY)  237 mL Oral Q24H  . gabapentin  300 mg Oral QHS  . insulin aspart  0-15 Units  Subcutaneous TID WC  . insulin aspart  0-5 Units Subcutaneous QHS  . levothyroxine  25 mcg Oral QAC breakfast  . lidocaine-prilocaine  1 application Topical Once per day on Mon Wed Fri  . metoprolol  100 mg Oral BID  . piperacillin-tazobactam (ZOSYN)  IV  2.25 g Intravenous Q8H  . potassium chloride  20 mEq Oral Once  . sodium chloride  3 mL Intravenous Q12H  . vancomycin  750 mg Intravenous Q M,W,F-HD  . warfarin  1 mg Oral ONCE-1800  . Warfarin - Pharmacist Dosing Inpatient   Does not apply q1800   Continuous Infusions:   Principal Problem:   Diabetic ulcer of left foot Active Problems:   End stage renal disease   Peripheral vascular disease   Hx of right BKA   Atrial fibrillation   Diastolic CHF, chronic   Cellulitis    Time spent: [facility] [facility] Pager [phone]. If 7PM-7AM, please contact night-coverage at [email or web address], password [facility] 05/17/2013, 2:42 PM  LOS: 5 days

## 2013-05-17 NOTE — Clinical Social Work Note (Signed)
CSW continues to follow with d/c plan. Per weekend supervisor at [facility], there is a bed available for patient d/c on 3/23. CSW attempted to meet with patient, however, patient was asleep. CSW informed patient's nurse of the above. No further needs at present time. CSW to follow tomorrow regarding d/c plan.   [place], [place] Weekend Clinical Social Worker [phone]

## 2013-05-17 NOTE — Progress Notes (Signed)
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 -Initial plain x-ray in the ED was no evidence of osteomyelitis, MRI still pending -Continue broad-spectrum IV antibiotics with vancomycin and Zosyn - Cultures from SNF with MSSA and group B strep -s/p-surgery/ray amputation 3/20 -discussed with Dr Erlinda Hong, and he states ok for DC froom ortho standpoint >>he recommends doxy or bactrim for 2wks and to follow up with Dr Sharol Given in 2weeks -awaiting SNF as pt does not want to return to [facility] Active Problems:  End stage renal disease  -Dialysis per renal, and MWF Type 2 diabetes mellitus -Continue monitoring Accu-Cheks and follow with sliding scale insulin Hypertension -BP controlled, Continue metoprolol Atrial fibrillation  -Rate controlled on metoprolol and amiodarone -INR trending up, Coumadin resumed 3/21- per Dr Erlinda Hong ok to resume. -Lovenox for DVT prophylaxis while INR is subtherapeutic Diastolic CHF, chronic  -Compensated -Volume control continuing with dialysis per renal Peripheral vascular disease  Hx of right BKA  Hypokalemia -again Replace K. Protein calorie malnutrition -Continue nutritional supplements  Code Status: Full code Family Communication: None at bedside Disposition Plan: Back to SNF when medically ready   Consultants:  Orthopedics  Renal  Procedures:  None  Antibiotics:  Vancomycin started 3/17  Zosyn started 3/17  HPI/Subjective: Denies any new c/o  Objective: Filed Vitals:   05/17/13 1100  BP: 120/57  Pulse: 85  Temp: 97.6 F (36.4 C)  Resp: 18    Intake/Output Summary (Last 24 hours) at 05/17/13 1442 Last data filed at 05/17/13 1136  Gross per 24 hour  Intake    663 ml  Output      0 ml  Net    663 ml   Filed Weights   05/13/13 1817 05/15/13 0916 05/15/13 1221  Weight:  65.8 kg (145 lb 1 oz) 67.3 kg (148 lb 5.9 oz) 66.4 kg (146 lb 6.2 oz)    Exam:  General: alert & oriented x 3 In NAD Cardiovascular: RRR, nl S1 s2 Respiratory: CTAB Abdomen: soft +BS NT/ND, no masses palpable Extremities: No cyanosis and no edema, left foot with dressing dressing clean and dry   Data Reviewed: Basic Metabolic Panel:  Recent Labs Lab 05/12/13 1311 05/13/13 0534 05/15/13 0928 05/16/13 0600 05/17/13 0530  NA 139 137 137 140 140  K 2.8* 3.2* 4.3 3.4* 3.3*  CL 95* 94* 94* 97 98  CO2 30 27 25 26 27   GLUCOSE 105* 137* 135* 150* 127*  BUN 18 25* 26* 17 29*  CREATININE 3.61* 4.36* 4.04* 3.14* 4.25*  CALCIUM 8.5 8.6 9.0 8.7 8.7  PHOS  --   --  2.4  --   --    Liver Function Tests:  Recent Labs Lab 05/12/13 1311 05/15/13 0928  AST 9  --   ALT <5  --   ALKPHOS 106  --   BILITOT 0.4  --   PROT 6.0  --   ALBUMIN 2.0* 2.1*    Recent Labs Lab 05/12/13 1311  LIPASE 23   No results found for this basename: AMMONIA,  in the last 168 hours CBC:  Recent Labs Lab 05/12/13 1311 05/13/13 0534 05/15/13 0928 05/16/13 0600  WBC 5.1 11.7* 15.5* 14.1*  NEUTROABS 4.3  --   --   --   HGB 16.6 9.6* 10.3* 10.3*  HCT 49.9 29.6* 31.8* 32.4*  MCV 93.6 92.8 93.5 95.0  PLT 141* 318  425* 351   Cardiac Enzymes: No results found for this basename: CKTOTAL, CKMB, CKMBINDEX, TROPONINI,  in the last 168 hours BNP (last 3 results)  Recent Labs  05/12/13 1311  PROBNP >70000.0*   CBG:  Recent Labs Lab 05/16/13 1253 05/16/13 1803 05/16/13 2148 05/17/13 0749 05/17/13 1142  GLUCAP 157* 135* 116* 125* 201*    No results found for this or any previous visit (from the past 240 hour(s)).   Studies: No results found.  Scheduled Meds: . amiodarone  100 mg Oral BID  . calcium acetate  1,334 mg Oral TID WC  . darbepoetin (ARANESP) injection - DIALYSIS  100 mcg Intravenous Q Wed-HD  . enoxaparin (LOVENOX) injection  30 mg Subcutaneous Q24H  . escitalopram  10 mg  Oral Daily  . feeding supplement (NEPRO CARB STEADY)  237 mL Oral Q24H  . gabapentin  300 mg Oral QHS  . insulin aspart  0-15 Units Subcutaneous TID WC  . insulin aspart  0-5 Units Subcutaneous QHS  . levothyroxine  25 mcg Oral QAC breakfast  . lidocaine-prilocaine  1 application Topical Once per day on Mon Wed Fri  . metoprolol  100 mg Oral BID  . piperacillin-tazobactam (ZOSYN)  IV  2.25 g Intravenous Q8H  . potassium chloride  20 mEq Oral Once  . sodium chloride  3 mL Intravenous Q12H  . vancomycin  750 mg Intravenous Q M,W,F-HD  . warfarin  1 mg Oral ONCE-1800  . Warfarin - Pharmacist Dosing Inpatient   Does not apply q1800   Continuous Infusions:   Principal Problem:   Diabetic ulcer of left foot Active Problems:   End stage renal disease   Peripheral vascular disease   Hx of right BKA   Atrial fibrillation   Diastolic CHF, chronic   Cellulitis    Time spent: [facility] Pager [phone]. If 7PM-7AM, please contact night-coverage at [email or web address], password [facility] 05/17/2013, 2:42 PM  LOS: 5 days

## 2013-05-17 NOTE — Discharge Instructions (Signed)
Information on my medicine - Coumadin®   (Warfarin) ° °This medication education was reviewed with me or my healthcare representative as part of my discharge preparation.  The pharmacist that spoke with me during my hospital stay was:  Miller, Evana Runnels L, RPH ° °Why was Coumadin prescribed for you? °Coumadin was prescribed for you because you have a blood clot or a medical condition that can cause an increased risk of forming blood clots. Blood clots can cause serious health problems by blocking the flow of blood to the heart, lung, or brain. Coumadin can prevent harmful blood clots from forming. °As a reminder your indication for Coumadin is:   Stroke Prevention Because Of Atrial Fibrillation ° °What test will check on my response to Coumadin? °While on Coumadin (warfarin) you will need to have an INR test regularly to ensure that your dose is keeping you in the desired range. The INR (international normalized ratio) number is calculated from the result of the laboratory test called prothrombin time (PT). ° °If an INR APPOINTMENT HAS NOT ALREADY BEEN MADE FOR YOU please schedule an appointment to have this lab work done by your health care provider within 7 days. °Your INR goal is usually a number between:  2 to 3 or your provider may give you a more narrow range like 2-2.5.  Ask your health care provider during an office visit what your goal INR is. ° °What  do you need to  know  About  COUMADIN? °Take Coumadin (warfarin) exactly as prescribed by your healthcare provider about the same time each day.  DO NOT stop taking without talking to the doctor who prescribed the medication.  Stopping without other blood clot prevention medication to take the place of Coumadin may increase your risk of developing a new clot or stroke.  Get refills before you run out. ° °What do you do if you miss a dose? °If you miss a dose, take it as soon as you remember on the same day then continue your regularly scheduled regimen the next  day.  Do not take two doses of Coumadin at the same time. ° °Important Safety Information °A possible side effect of Coumadin (Warfarin) is an increased risk of bleeding. You should call your healthcare provider right away if you experience any of the following: °  Bleeding from an injury or your nose that does not stop. °  Unusual colored urine (red or dark brown) or unusual colored stools (red or black). °  Unusual bruising for unknown reasons. °  A serious fall or if you hit your head (even if there is no bleeding). ° °Some foods or medicines interact with Coumadin® (warfarin) and might alter your response to warfarin. To help avoid this: °  Eat a balanced diet, maintaining a consistent amount of Vitamin K. °  Notify your provider about major diet changes you plan to make. °  Avoid alcohol or limit your intake to 1 drink for women and 2 drinks for men per day. °(1 drink is 5 oz. wine, 12 oz. beer, or 1.5 oz. liquor.) ° °Make sure that ANY health care provider who prescribes medication for you knows that you are taking Coumadin (warfarin).  Also make sure the healthcare provider who is monitoring your Coumadin knows when you have started a new medication including herbals and non-prescription products. ° °Coumadin® (Warfarin)  Major Drug Interactions  °Increased Warfarin Effect Decreased Warfarin Effect  °Alcohol (large quantities) °Antibiotics (esp. Septra/Bactrim, Flagyl, Cipro) °Amiodarone (Cordarone) °Aspirin (  ASA) °Cimetidine (Tagamet) °Megestrol (Megace) °NSAIDs (ibuprofen, naproxen, etc.) °Piroxicam (Feldene) °Propafenone (Rythmol SR) °Propranolol (Inderal) °Isoniazid (INH) °Posaconazole (Noxafil) Barbiturates (Phenobarbital) °Carbamazepine (Tegretol) °Chlordiazepoxide (Librium) °Cholestyramine (Questran) °Griseofulvin °Oral Contraceptives °Rifampin °Sucralfate (Carafate) °Vitamin K  ° °Coumadin® (Warfarin) Major Herbal Interactions  °Increased Warfarin Effect Decreased Warfarin Effect   °Garlic °Ginseng °Ginkgo biloba Coenzyme Q10 °Green tea °St. John’s wort   ° °Coumadin® (Warfarin) FOOD Interactions  °Eat a consistent number of servings per week of foods HIGH in Vitamin K °(1 serving = ½ cup)  °Collards (cooked, or boiled & drained) °Kale (cooked, or boiled & drained) °Mustard greens (cooked, or boiled & drained) °Parsley *serving size only = ¼ cup °Spinach (cooked, or boiled & drained) °Swiss chard (cooked, or boiled & drained) °Turnip greens (cooked, or boiled & drained)  °Eat a consistent number of servings per week of foods MEDIUM-HIGH in Vitamin K °(1 serving = 1 cup)  °Asparagus (cooked, or boiled & drained) °Broccoli (cooked, boiled & drained, or raw & chopped) °Brussel sprouts (cooked, or boiled & drained) *serving size only = ½ cup °Lettuce, raw (green leaf, endive, romaine) °Spinach, raw °Turnip greens, raw & chopped  ° °These websites have more information on Coumadin (warfarin):  www.coumadin.com; °www.ahrq.gov/consumer/coumadin.htm; ° ° ° °

## 2013-05-17 NOTE — Progress Notes (Signed)
Subjective:  Patient reports no events.  Objective:   VITALS:   Filed Vitals:   05/16/13 0528 05/16/13 1400 05/16/13 2046 05/17/13 0438  BP: 134/61 136/65 126/59 128/54  Pulse: 82 88 50 81  Temp: 97.2 F (36.2 C) 98 F (36.7 C) 97.6 F (36.4 C) 97.6 F (36.4 C)  TempSrc: Oral Oral Oral Oral  Resp: 16 18 16 16   Height:      Weight:      SpO2: 95% 94% 99% 98%    Dressing intact. no drainage   Lab Results  Component Value Date   WBC 14.1* 05/16/2013   HGB 10.3* 05/16/2013   HCT 32.4* 05/16/2013   MCV 95.0 05/16/2013   PLT 351 05/16/2013     Assessment/Plan: 2 Days Post-Op   Problem List Items Addressed This Visit     Cardiovascular and Mediastinum   Peripheral vascular disease (Chronic)   Relevant Medications      amiodarone (PACERONE) tablet 100 mg      metoprolol (LOPRESSOR) tablet 100 mg      warfarin (COUMADIN) tablet 1 mg (Completed)      enoxaparin (LOVENOX) injection 30 mg      Warfarin - Pharmacist Dosing Inpatient      warfarin (COUMADIN) tablet 1 mg (Completed)      warfarin (COUMADIN) tablet 1 mg (Start on 05/17/2013  8:56 PM)   Diastolic CHF, chronic (Chronic)   Atrial fibrillation     Endocrine   *Diabetic ulcer of left foot   Relevant Medications      insulin lispro (HUMALOG) 100 UNIT/ML injection      insulin aspart (novoLOG) injection 0-15 Units      insulin aspart (novoLOG) injection 0-5 Units     Genitourinary   End stage renal disease     Other   Anemia of chronic renal failure (Chronic)   Relevant Medications      darbepoetin (ARANESP) injection 100 mcg      darbepoetin (ARANESP) 100 MCG/0.5ML injection (Completed)   Anxiety   Relevant Medications      escitalopram (LEXAPRO) tablet 10 mg   Open wound of left foot   Hx of right BKA   Cellulitis    Other Visit Diagnoses   Hypokalemia    -  Primary    Pulmonary edema        Renal failure        Foot infection        Relevant Medications       vancomycin (VANCOCIN) IVPB 750  mg/150 ml premix       Needs SNF placement Continue surgical dressing NWB LLE   Marianna Payment 05/17/2013, 9:28 AM [phone]

## 2013-05-17 NOTE — Progress Notes (Signed)
[place] for lovenox/warfarin Indication: VTE px while INR <2.0, Afib  Allergies  Allergen Reactions  . Ace Inhibitors Other (See Comments)    Unknown   . Lisinopril Cough    Patient Measurements: Height: 5' 10.08" (178 cm) Weight: 146 lb 6.2 oz (66.4 kg) IBW/kg (Calculated) : 73.18  Vital Signs: Temp: 97.6 F (36.4 C) (03/22 0438) Temp src: Oral (03/22 0438) BP: 128/54 mmHg (03/22 0438) Pulse Rate: 81 (03/22 0438)  Labs:  Recent Labs  05/15/13 0454 05/15/13 0928 05/16/13 0600 05/17/13 0530  HGB  --  10.3* 10.3*  --   HCT  --  31.8* 32.4*  --   PLT  --  425* 351  --   LABPROT 18.0*  --  20.9* 20.6*  INR 1.53*  --  1.86* 1.83*  CREATININE  --  4.04* 3.14* 4.25*    Estimated Creatinine Clearance: 14.8 ml/min (by C-G formula based on Cr of 4.25).   Assessment: 73 yo M with DM and ESRD/HD MWF. Warfarin dose from nursing home 1mg  daily.  His coumadin for afib held for 3 days for surgery on Friday.  Now S/P toe amputation to resume coumadin.  INR today 1.83, which is a very slight decrease from yesterday, likely secondary to holding for 3 days. Will give usual home dose since INR already increased. Continue VTE px Lovenox while INR >2.0. No bleeding noted.  Goal of Therapy:  INR 2-3 Monitor platelets by anticoagulation protocol: Yes   Plan:  -Lovenox 30 mg sq q24 hours -Warfarin 1 mg tonight -daily INRs, CBC Q3 days while on lovenox -monitor for s/s of bleeding  Thank you, Vivia Ewing, PharmD Clinical Pharmacist - Resident Pager: [phone] Pharmacy: [phone] 05/17/2013 8:20 AM

## 2013-05-17 NOTE — Progress Notes (Signed)
I agree with note as articulated by Ms Reinaldo Meeker today.  Patient c/o confusion but reports limited to no pain. "They just keep giving it(pain meds) to me." Will reduce PO narcotic dosage.  To return to SNF upon discharge. Shandiin Eisenbeis C

## 2013-05-18 LAB — CBC
HCT: 30.1 % — ABNORMAL LOW (ref 39.0–52.0)
Hemoglobin: 9.7 g/dL — ABNORMAL LOW (ref 13.0–17.0)
MCH: 30.1 pg (ref 26.0–34.0)
MCHC: 32.2 g/dL (ref 30.0–36.0)
MCV: 93.5 fL (ref 78.0–100.0)
Platelets: 322 10*3/uL (ref 150–400)
RBC: 3.22 MIL/uL — ABNORMAL LOW (ref 4.22–5.81)
RDW: 16.2 % — ABNORMAL HIGH (ref 11.5–15.5)
WBC: 16.2 10*3/uL — ABNORMAL HIGH (ref 4.0–10.5)

## 2013-05-18 LAB — RENAL FUNCTION PANEL
Albumin: 1.8 g/dL — ABNORMAL LOW (ref 3.5–5.2)
BUN: 39 mg/dL — ABNORMAL HIGH (ref 6–23)
CO2: 24 mEq/L (ref 19–32)
Calcium: 9.1 mg/dL (ref 8.4–10.5)
Chloride: 95 mEq/L — ABNORMAL LOW (ref 96–112)
Creatinine, Ser: 5.27 mg/dL — ABNORMAL HIGH (ref 0.50–1.35)
GFR calc Af Amer: 11 mL/min — ABNORMAL LOW (ref >90)
GFR calc non Af Amer: 10 mL/min — ABNORMAL LOW (ref >90)
Glucose, Bld: 110 mg/dL — ABNORMAL HIGH (ref 70–99)
Phosphorus: 2.1 mg/dL — ABNORMAL LOW (ref 2.3–4.6)
Potassium: 3.4 mEq/L — ABNORMAL LOW (ref 3.7–5.3)
Sodium: 136 mEq/L — ABNORMAL LOW (ref 137–147)

## 2013-05-18 LAB — GLUCOSE, CAPILLARY
GLUCOSE-CAPILLARY: 185 mg/dL — AB (ref 70–99)
Glucose-Capillary: 128 mg/dL — ABNORMAL HIGH (ref 70–99)
Glucose-Capillary: 120 mg/dL — ABNORMAL HIGH (ref 70–99)

## 2013-05-18 LAB — PROTIME-INR
INR: 1.74 — ABNORMAL HIGH (ref 0.00–1.49)
Prothrombin Time: 19.8 seconds — ABNORMAL HIGH (ref 11.6–15.2)

## 2013-05-18 MED ORDER — WARFARIN SODIUM 2 MG PO TABS
2.0000 mg | ORAL_TABLET | Freq: Once | ORAL | Status: AC
  Administered 2013-05-18: 2 mg via ORAL
  Filled 2013-05-18: qty 1

## 2013-05-18 MED ORDER — ALTEPLASE 2 MG IJ SOLR
2.0000 mg | Freq: Once | INTRAMUSCULAR | Status: DC | PRN
  Filled 2013-05-18: qty 2

## 2013-05-18 MED ORDER — SODIUM CHLORIDE 0.9 % IV SOLN: 100.0000 mL | INTRAVENOUS | Status: DC | PRN

## 2013-05-18 MED ORDER — HEPARIN SODIUM (PORCINE) 1000 UNIT/ML DIALYSIS: 1000.0000 [IU] | INTRAMUSCULAR | Status: DC | PRN

## 2013-05-18 MED ORDER — HEPARIN SODIUM (PORCINE) 1000 UNIT/ML DIALYSIS: 20.0000 [IU]/kg | INTRAMUSCULAR | Status: DC | PRN

## 2013-05-18 MED ORDER — LIDOCAINE-PRILOCAINE 2.5-2.5 % EX CREA: 1.0000 "application " | TOPICAL_CREAM | CUTANEOUS | Status: DC | PRN

## 2013-05-18 MED ORDER — LIDOCAINE HCL (PF) 1 % IJ SOLN: 5.0000 mL | INTRAMUSCULAR | Status: DC | PRN

## 2013-05-18 MED ORDER — PENTAFLUOROPROP-TETRAFLUOROETH EX AERO: 1.0000 "application " | INHALATION_SPRAY | CUTANEOUS | Status: DC | PRN

## 2013-05-18 MED ORDER — NEPRO/CARBSTEADY PO LIQD: 237.0000 mL | ORAL | Status: DC | PRN

## 2013-05-18 NOTE — Progress Notes (Signed)
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 -Initial plain x-ray in the ED was no evidence of osteomyelitis, MRI still pending -Continue broad-spectrum IV antibiotics with vancomycin and Zosyn - Cultures from SNF with MSSA and group B strep -s/p-surgery/ray amputation 3/20 -discussed with Dr Erlinda Hong, and he states ok for DC froom ortho standpoint >>he recommends doxy or bactrim for 2wks and to follow up with Dr Sharol Given in 2weeks -awaiting SNF as pt does not want to return to [facility] Active Problems:  End stage renal disease  -Dialysis per renal, and MWF Type 2 diabetes mellitus -Continue monitoring Accu-Cheks and follow with sliding scale insulin Hypertension -BP controlled, Continue metoprolol Atrial fibrillation  -Rate controlled on metoprolol and amiodarone -INR trending up, Coumadin resumed 3/21- per Dr Erlinda Hong ok to resume. -Lovenox for DVT prophylaxis while INR is subtherapeutic Diastolic CHF, chronic  -Compensated -Volume control continuing with dialysis per renal Peripheral vascular disease  Hx of right BKA  Hypokalemia -again Replace K. Protein calorie malnutrition -Continue nutritional supplements  Code Status: Full code Family Communication: None at bedside Disposition Plan: Back to SNF when medically ready   Consultants:  Orthopedics  Renal  Procedures:  None  Antibiotics:  Vancomycin started 3/17  Zosyn started 3/17  HPI/Subjective: Denies any new c/o  Objective: Filed Vitals:   05/18/13 1730  BP: 103/50  Pulse: 94  Temp: 97.8 F (36.6 C)  Resp: 18    Intake/Output Summary (Last 24 hours) at 05/18/13 1740 Last data filed at 05/18/13 1210  Gross per 24 hour  Intake    240 ml  Output   3004 ml  Net  -2764 ml   Filed Weights   05/17/13 2148 05/18/13 0701 05/18/13 1058  Weight:  68.1 kg (150 lb 2.1 oz) 65.9 kg (145 lb 4.5 oz) 64.1 kg (141 lb 5 oz)    Exam:  General: alert & oriented x 3 In NAD Cardiovascular: RRR, nl S1 s2 Respiratory: CTAB Abdomen: soft +BS NT/ND, no masses palpable Extremities: No cyanosis and no edema, left foot with dressing dressing clean and dry   Data Reviewed: Basic Metabolic Panel:  Recent Labs Lab 05/13/13 0534 05/15/13 0928 05/16/13 0600 05/17/13 0530 05/18/13 0732  NA 137 137 140 140 136*  K 3.2* 4.3 3.4* 3.3* 3.4*  CL 94* 94* 97 98 95*  CO2 27 25 26 27 24   GLUCOSE 137* 135* 150* 127* 110*  BUN 25* 26* 17 29* 39*  CREATININE 4.36* 4.04* 3.14* 4.25* 5.27*  CALCIUM 8.6 9.0 8.7 8.7 9.1  PHOS  --  2.4  --   --  2.1*   Liver Function Tests:  Recent Labs Lab 05/12/13 1311 05/15/13 0928 05/18/13 0732  AST 9  --   --   ALT <5  --   --   ALKPHOS 106  --   --   BILITOT 0.4  --   --   PROT 6.0  --   --   ALBUMIN 2.0* 2.1* 1.8*    Recent Labs Lab 05/12/13 1311  LIPASE 23   No results found for this basename: AMMONIA,  in the last 168 hours CBC:  Recent Labs Lab 05/12/13 1311 05/13/13 0534 05/15/13 0928 05/16/13 0600 05/18/13 0731  WBC 5.1 11.7* 15.5* 14.1* 16.2*  NEUTROABS 4.3  --   --   --   --   HGB 16.6 9.6* 10.3*  10.3* 9.7*  HCT 49.9 29.6* 31.8* 32.4* 30.1*  MCV 93.6 92.8 93.5 95.0 93.5  PLT 141* 318 425* 351 322   Cardiac Enzymes: No results found for this basename: CKTOTAL, CKMB, CKMBINDEX, TROPONINI,  in the last 168 hours BNP (last 3 results)  Recent Labs  05/12/13 1311  PROBNP >70000.0*   CBG:  Recent Labs Lab 05/17/13 1142 05/17/13 1716 05/17/13 2147 05/18/13 1134 05/18/13 1620  GLUCAP 201* 154* 97 128* 185*    No results found for this or any previous visit (from the past 240 hour(s)).   Studies: No results found.  Scheduled Meds: . amiodarone  100 mg Oral BID  . darbepoetin (ARANESP) injection - DIALYSIS  100 mcg Intravenous Q Wed-HD  . enoxaparin (LOVENOX) injection   30 mg Subcutaneous Q24H  . escitalopram  10 mg Oral Daily  . feeding supplement (NEPRO CARB STEADY)  237 mL Oral Q24H  . gabapentin  300 mg Oral QHS  . insulin aspart  0-15 Units Subcutaneous TID WC  . insulin aspart  0-5 Units Subcutaneous QHS  . levothyroxine  25 mcg Oral QAC breakfast  . lidocaine-prilocaine  1 application Topical Once per day on Mon Wed Fri  . metoprolol  100 mg Oral BID  . piperacillin-tazobactam (ZOSYN)  IV  2.25 g Intravenous Q8H  . potassium chloride  20 mEq Oral Once  . sodium chloride  3 mL Intravenous Q12H  . vancomycin  750 mg Intravenous Q M,W,F-HD  . Warfarin - Pharmacist Dosing Inpatient   Does not apply q1800   Continuous Infusions:   Principal Problem:   Diabetic ulcer of left foot Active Problems:   End stage renal disease   Peripheral vascular disease   Hx of right BKA   Atrial fibrillation   Diastolic CHF, chronic   Cellulitis    Time spent: [facility] Pager [phone]. If 7PM-7AM, please contact night-coverage at [email or web address], password [facility] 05/18/2013, 5:40 PM  LOS: 6 days

## 2013-05-18 NOTE — Progress Notes (Signed)
ANTICOAGULATION CONSULT NOTE - Follow Up Consult  Pharmacy Consult for coumadin; vancomycin and zosyn Indication: atrial fibrillation, VTE prophylaxis; wound infection  Allergies  Allergen Reactions  . Ace Inhibitors Other (See Comments)    Unknown   . Lisinopril Cough    Patient Measurements: Height: 5' 10.08" (178 cm) Weight: 141 lb 5 oz (64.1 kg) IBW/kg (Calculated) : 73.18   Vital Signs: Temp: 97.5 F (36.4 C) (03/23 1058) Temp src: Oral (03/23 1058) BP: 112/57 mmHg (03/23 1058) Pulse Rate: 83 (03/23 1058)  Labs:  Recent Labs  05/16/13 0600 05/17/13 0530 05/18/13 0500 05/18/13 0731 05/18/13 0732  HGB 10.3*  --   --  9.7*  --   HCT 32.4*  --   --  30.1*  --   PLT 351  --   --  322  --   LABPROT 20.9* 20.6* 19.8*  --   --   INR 1.86* 1.83* 1.74*  --   --   CREATININE 3.14* 4.25*  --   --  5.27*    Estimated Creatinine Clearance: 11.5 ml/min (by C-G formula based on Cr of 5.27).  Assessment: Patient is a 73 y.o M on anticoagulation for VTE prophylaxis  and hx of Afib.  INR remains subtherapeutic at 1.74 (trending down with home dose of 1mg  daily).  No bleeding documented.  Patient's also on vancomycin and zosyn day #6 for MSSA and group B strep wound infection from cultures obtained at [facility].  No new cultures with this admit.  Will hold off on obtaining vancomycin level for now since note indicated that plan is to change to PO abx at discharge.  Goal of Therapy:  INR 2-3 Monitor platelets by anticoagulation protocol: Yes   Plan:  1) coumadin 2mg  PO x1 today 2) d/c lovenox when INR>2 3) no change for vancomycin and zosyn regimens  Ami Thornsberry P 05/18/2013,1:46 PM

## 2013-05-18 NOTE — Progress Notes (Signed)
Subjective:  Tolerated hd today/  Pleasantly confused "I think its wed /maybe at [facility]" Objective Vital signs in last 24 hours: Filed Vitals:   05/18/13 0930 05/18/13 1000 05/18/13 1030 05/18/13 1058  BP: 107/58 113/56 117/59 112/57  Pulse: 82 82 87 83  Temp:    97.5 F (36.4 C)  TempSrc:    Oral  Resp:    18  Height:      Weight:    64.1 kg (141 lb 5 oz)  SpO2:    97%   Physical Exam  General: awoken from sleep /and pleasantly confused  Heart: irreg irreg  vr satble 80's Lungs:CTA bilat Abdomen: soft NT. ND  Extremities: left foot wrapped dry clean/; right BKA with scabbed areas on stump  Dialysis Access: Right lower AVF + bruit with skin tear   Dialysis Orders: Center: [facility] on MWF .  EDW 68.5kg HD Bath 2k,2.5 ca Time 3hr 87min Heparin 7,000. Access R FA AVF BFR 450 DFR af 1.5  Hectorol 0 mcg IV/HD Epogen 2600 Units IV/HD Venofer 0   Problem/Plan: 1. 4/5th ray amp L foot for gangrene - Dr. Sharol Given 3/20 - IV Vanc and Zosyn / duration ? 2. ESRD - MWF K 3.4- used 4 k bath on hd today 3. AMS- sec to pain meds ? Sp surgery / infect 3. Anemia - Hgb 10.3;> 9.7 Aranesp 100 q Wed  4. Secondary hyperparathyroidism - P 2.1 Corrected calcium 10.9=  Hold  phoslo  -  5. HTN/volume - controlled / wt 64.1 lower edw at dc  6. Nutrition - Alb 2.1>1.8 Renal diet + vit; drinking 2 Nepro a day 7. Afib - BB, amio and coumadin being titrated by pharmacy  8. DM/ hypothyroidism - usual meds  9. Disp - to return to SNF  Ernest Haber, PA-C [place] [phone] 05/18/2013,2:50 PM  LOS: 6 days   Pt seen, examined and agree w A/P as above.  Kelly Splinter MD pager [phone]    cell [phone] 05/18/2013, 4:24 PM  Labs: Basic Metabolic Panel:  Recent Labs Lab 05/15/13 0928 05/16/13 0600 05/17/13 0530 05/18/13 0732  NA 137 140 140 136*  K 4.3 3.4* 3.3* 3.4*  CL 94* 97 98 95*  CO2 25 26 27 24   GLUCOSE 135* 150* 127* 110*  BUN 26* 17 29* 39*   CREATININE 4.04* 3.14* 4.25* 5.27*  CALCIUM 9.0 8.7 8.7 9.1  PHOS 2.4  --   --  2.1*   Liver Function Tests:  Recent Labs Lab 05/12/13 1311 05/15/13 0928 05/18/13 0732  AST 9  --   --   ALT <5  --   --   ALKPHOS 106  --   --   BILITOT 0.4  --   --   PROT 6.0  --   --   ALBUMIN 2.0* 2.1* 1.8*    Recent Labs Lab 05/12/13 1311  LIPASE 23  CBC:  Recent Labs Lab 05/12/13 1311 05/13/13 0534 05/15/13 0928 05/16/13 0600 05/18/13 0731  WBC 5.1 11.7* 15.5* 14.1* 16.2*  NEUTROABS 4.3  --   --   --   --   HGB 16.6 9.6* 10.3* 10.3* 9.7*  HCT 49.9 29.6* 31.8* 32.4* 30.1*  MCV 93.6 92.8 93.5 95.0 93.5  PLT 141* 318 425* 351 322  CBG:  Recent Labs Lab 05/17/13 0749 05/17/13 1142 05/17/13 1716 05/17/13 2147 05/18/13 1134  GLUCAP 125* 201* 154* 97 128*  Medications:   . amiodarone  100 mg  Oral BID  . calcium acetate  1,334 mg Oral TID WC  . darbepoetin (ARANESP) injection - DIALYSIS  100 mcg Intravenous Q Wed-HD  . enoxaparin (LOVENOX) injection  30 mg Subcutaneous Q24H  . escitalopram  10 mg Oral Daily  . feeding supplement (NEPRO CARB STEADY)  237 mL Oral Q24H  . gabapentin  300 mg Oral QHS  . insulin aspart  0-15 Units Subcutaneous TID WC  . insulin aspart  0-5 Units Subcutaneous QHS  . levothyroxine  25 mcg Oral QAC breakfast  . lidocaine-prilocaine  1 application Topical Once per day on Mon Wed Fri  . metoprolol  100 mg Oral BID  . piperacillin-tazobactam (ZOSYN)  IV  2.25 g Intravenous Q8H  . potassium chloride  20 mEq Oral Once  . sodium chloride  3 mL Intravenous Q12H  . vancomycin  750 mg Intravenous Q M,W,F-HD  . warfarin  2 mg Oral ONCE-1800  . Warfarin - Pharmacist Dosing Inpatient   Does not apply [phone]

## 2013-05-18 NOTE — Clinical Social Work Note (Signed)
Patient was to discharge to [place], however because he is set-up for dialysis in [facility], this facility unable to accept him. CSW was advised by [facility] admissions staff person that patient has exhausted his hundred Medicare days. CSW contacted [facility] (facility patient came to hosptial from and left 2 messages for admissions staff, but did not receive a return call. CSW learned form financial counselor that patient does not have a pending Medicaid application. CSW talked with patient regarding situation and he is unable to pay for a skilled facility to continue his rehab out-of-pocket. CSW will follow-up with nurse case manager regarding home health services for patient.  Shavon Ashmore Givens, MSW, LCSW [phone]

## 2013-05-19 ENCOUNTER — Encounter (HOSPITAL_COMMUNITY): Payer: Self-pay | Admitting: Orthopedic Surgery

## 2013-05-19 LAB — PROTIME-INR
INR: 1.93 — ABNORMAL HIGH (ref 0.00–1.49)
PROTHROMBIN TIME: 21.5 s — AB (ref 11.6–15.2)

## 2013-05-19 LAB — GLUCOSE, CAPILLARY
GLUCOSE-CAPILLARY: 141 mg/dL — AB (ref 70–99)
GLUCOSE-CAPILLARY: 120 mg/dL — AB (ref 70–99)
Glucose-Capillary: 181 mg/dL — ABNORMAL HIGH (ref 70–99)
Glucose-Capillary: 129 mg/dL — ABNORMAL HIGH (ref 70–99)

## 2013-05-19 MED ORDER — LIDOCAINE-PRILOCAINE 2.5-2.5 % EX CREA: 1.0000 "application " | TOPICAL_CREAM | CUTANEOUS | Status: DC | PRN

## 2013-05-19 MED ORDER — WARFARIN SODIUM 2 MG PO TABS
2.0000 mg | ORAL_TABLET | Freq: Once | ORAL | Status: AC
  Administered 2013-05-19: 2 mg via ORAL
  Filled 2013-05-19 (×2): qty 1

## 2013-05-19 MED ORDER — NEPRO/CARBSTEADY PO LIQD
237.0000 mL | Freq: Two times a day (BID) | ORAL | Status: DC
  Administered 2013-05-19 – 2013-05-22 (×6): 237 mL via ORAL

## 2013-05-19 MED ORDER — NEPRO/CARBSTEADY PO LIQD: 237.0000 mL | ORAL | Status: DC | PRN

## 2013-05-19 MED ORDER — SODIUM CHLORIDE 0.9 % IV SOLN: 100.0000 mL | INTRAVENOUS | Status: DC | PRN

## 2013-05-19 MED ORDER — HEPARIN SODIUM (PORCINE) 1000 UNIT/ML DIALYSIS
1000.0000 [IU] | INTRAMUSCULAR | Status: DC | PRN
  Filled 2013-05-19: qty 1

## 2013-05-19 MED ORDER — HEPARIN SODIUM (PORCINE) 1000 UNIT/ML DIALYSIS
4000.0000 [IU] | Freq: Once | INTRAMUSCULAR | Status: DC
  Filled 2013-05-19: qty 4

## 2013-05-19 MED ORDER — LIDOCAINE HCL (PF) 1 % IJ SOLN: 5.0000 mL | INTRAMUSCULAR | Status: DC | PRN

## 2013-05-19 MED ORDER — PENTAFLUOROPROP-TETRAFLUOROETH EX AERO: 1.0000 "application " | INHALATION_SPRAY | CUTANEOUS | Status: DC | PRN

## 2013-05-19 MED ORDER — ALTEPLASE 2 MG IJ SOLR
2.0000 mg | Freq: Once | INTRAMUSCULAR | Status: AC | PRN
  Filled 2013-05-19: qty 2

## 2013-05-19 NOTE — Progress Notes (Signed)
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 -Initial plain x-ray in the ED was no evidence of osteomyelitis, MRI still pending -Continue broad-spectrum IV antibiotics with vancomycin and Zosyn - Cultures from SNF with MSSA and group B strep -s/p-surgery/ray amputation 3/20 -discussed with Dr Erlinda Hong 3/22, and he states ok for DC from ortho standpoint >>he recommends doxy or bactrim on discharge for 2wks and to follow up with Dr Sharol Given in 2weeks -awaiting SNF as pt does not want to return to [facility] Active Problems:  End stage renal disease  -Dialysis per renal, and MWF Type 2 diabetes mellitus -Continue monitoring Accu-Cheks and follow with sliding scale insulin Hypertension -BP controlled, Continue metoprolol Atrial fibrillation  -Rate controlled on metoprolol and amiodarone -INR trending up, Coumadin resumed 3/21- per Dr Erlinda Hong ok to resume. -Lovenox for DVT prophylaxis while INR is subtherapeutic Diastolic CHF, chronic  -Compensated -Volume control continuing with dialysis per renal Peripheral vascular disease  Hx of right BKA  Hypokalemia -again Replace K. Protein calorie malnutrition -Continue nutritional supplements  Code Status: Full code Family Communication: None at bedside Disposition Plan: Back to SNF when medically ready   Consultants:  Orthopedics  Renal  Procedures:  None  Antibiotics:  Vancomycin started 3/17  Zosyn started 3/17  HPI/Subjective: Denies any new c/o  Objective: Filed Vitals:   05/19/13 1100  BP: 119/61  Pulse: 92  Temp: 98.2 F (36.8 C)  Resp: 18    Intake/Output Summary (Last 24 hours) at 05/19/13 1500 Last data filed at 05/19/13 1100  Gross per 24 hour  Intake    480 ml  Output      0 ml  Net    480 ml   Filed Weights   05/18/13 0701 05/18/13 1058 05/18/13  2116  Weight: 65.9 kg (145 lb 4.5 oz) 64.1 kg (141 lb 5 oz) 66.1 kg (145 lb 11.6 oz)    Exam:  General: alert & oriented x 3 In NAD Cardiovascular: RRR, nl S1 s2 Respiratory: CTAB Abdomen: soft +BS NT/ND, no masses palpable Extremities: No cyanosis and no edema, left foot with dressing dressing clean and dry   Data Reviewed: Basic Metabolic Panel:  Recent Labs Lab 05/13/13 0534 05/15/13 0928 05/16/13 0600 05/17/13 0530 05/18/13 0732  NA 137 137 140 140 136*  K 3.2* 4.3 3.4* 3.3* 3.4*  CL 94* 94* 97 98 95*  CO2 27 25 26 27 24   GLUCOSE 137* 135* 150* 127* 110*  BUN 25* 26* 17 29* 39*  CREATININE 4.36* 4.04* 3.14* 4.25* 5.27*  CALCIUM 8.6 9.0 8.7 8.7 9.1  PHOS  --  2.4  --   --  2.1*   Liver Function Tests:  Recent Labs Lab 05/15/13 0928 05/18/13 0732  ALBUMIN 2.1* 1.8*   No results found for this basename: LIPASE, AMYLASE,  in the last 168 hours No results found for this basename: AMMONIA,  in the last 168 hours CBC:  Recent Labs Lab 05/13/13 0534 05/15/13 0928 05/16/13 0600 05/18/13 0731  WBC 11.7* 15.5* 14.1* 16.2*  HGB 9.6* 10.3* 10.3* 9.7*  HCT 29.6* 31.8* 32.4* 30.1*  MCV 92.8 93.5 95.0 93.5  PLT 318 425* 351 322   Cardiac Enzymes: No results found for this basename: CKTOTAL, CKMB, CKMBINDEX, TROPONINI,  in the last 168 hours BNP (last 3 results)  Recent Labs  05/12/13 1311  PROBNP >70000.0*   CBG:  Recent Labs Lab 05/18/13 1134 05/18/13 1620 05/18/13 2115 05/19/13 0740 05/19/13 1240  GLUCAP 128* 185* 120* 141* 181*    No results found for this or any previous visit (from the past 240 hour(s)).   Studies: No results found.  Scheduled Meds: . amiodarone  100 mg Oral BID  . darbepoetin (ARANESP) injection - DIALYSIS  100 mcg Intravenous Q Wed-HD  . enoxaparin (LOVENOX) injection  30 mg Subcutaneous Q24H  . escitalopram  10 mg Oral Daily  . feeding supplement (NEPRO CARB STEADY)  237 mL Oral BID BM  . gabapentin  300 mg Oral QHS   . insulin aspart  0-15 Units Subcutaneous TID WC  . insulin aspart  0-5 Units Subcutaneous QHS  . levothyroxine  25 mcg Oral QAC breakfast  . lidocaine-prilocaine  1 application Topical Once per day on Mon Wed Fri  . metoprolol  100 mg Oral BID  . piperacillin-tazobactam (ZOSYN)  IV  2.25 g Intravenous Q8H  . potassium chloride  20 mEq Oral Once  . sodium chloride  3 mL Intravenous Q12H  . vancomycin  750 mg Intravenous Q M,W,F-HD  . warfarin  2 mg Oral ONCE-1800  . Warfarin - Pharmacist Dosing Inpatient   Does not apply q1800   Continuous Infusions:   Principal Problem:   Diabetic ulcer of left foot Active Problems:   End stage renal disease   Peripheral vascular disease   Hx of right BKA   Atrial fibrillation   Diastolic CHF, chronic   Cellulitis    Time spent: [place] [facility] Pager [phone]. If 7PM-7AM, please contact night-coverage at [email or web address], password [facility] 05/19/2013, 3:00 PM  LOS: 7 days

## 2013-05-19 NOTE — Progress Notes (Signed)
[facility] Progress Note  Subjective:   Nauseated - started about 5 minutes before I saw him. Can't eat.  Worried about lack of money for NH  Objective Filed Vitals:   05/18/13 1359 05/18/13 1730 05/18/13 2116 05/19/13 0616  BP: 104/60 103/50 105/75 121/63  Pulse: 85 94 84 88  Temp: 98.2 F (36.8 C) 97.8 F (36.6 C) 97.6 F (36.4 C) 98 F (36.7 C)  TempSrc: Oral Oral Oral Oral  Resp: 18 18 18 18   Height:      Weight:   66.1 kg (145 lb 11.6 oz)   SpO2:  98% 95% 98%   Physical Exam General: alert and articulate Heart: irreg irreg Lungs:  No wheezes or rales Abdomen: soft Extremities: left foot wrapped; no right BKA edema Dialysis Access: Right lower AVF + bruit   Dialysis Orders: Center: [facility] on MWF .  EDW 68.5kg HD Bath 2k,2.5 ca Time 3hr 65min Heparin 7,000. Access R FA AVF BFR 450 DFR af 1.5  Hectorol 0 mcg IV/HD Epogen 2600 Units IV/HD Venofer 0   Problem/Plan:  1. 4/5th ray amp L foot for gangrene - Dr. Sharol Given 3/20 - IV Vanc and Zosyn / duration ?; still with some leukocytosis, but afebrile 2. ESRD - MWF K 3.4- used 4 k bath Monday - HD Wed 3. AMS- sec to pain meds ? Sp surgery / infect - pretty much at baseline - some insight into the reality of his financial situation regarding d/c planning 3. Anemia - Hgb 10.3;> 9.7 Aranesp 100 q Wed  4. Secondary hyperparathyroidism - P 2.1 Corrected calcium 10.9= Hold phoslo - need to change to lower Ca bath at discharge 5. HTN/volume - controlled / wt 64.1 post HD Monday with UF 3 L 6. Malnutrition - Alb 2.1>1.8 Renal diet + vit; drinking 2 Nepro a day - stopped prostat b/c he dislikes 7. Afib - BB, amio and coumadin being titrated by pharmacy  8. DM/ hypothyroidism - usual meds  9. Disp - to return to SNF; has exhausted Medicare 100 days - not on Medicaid - pt does not have resources for SNF; SW/Case Manager working on the situation; he cannot go home - his house I believe was condemned; vermin; hoarding;  feces  Myriam Jacobson, PA-C [place] [phone] 05/19/2013,9:28 AM  LOS: 7 days   Pt seen, examined, agree w assess/plan as above with additions as indicated. Stable medically. Disposition issues at noted above. SW is involved.   Kelly Splinter MD pager [phone]    cell [phone] 05/19/2013, 12:23 PM      Additional Objective Labs: Lab Results  Component Value Date   INR 1.93* 05/19/2013   INR 1.74* 05/18/2013   INR 1.83* 1/44/3154    Basic Metabolic Panel:  Recent Labs Lab 05/15/13 0928 05/16/13 0600 05/17/13 0530 05/18/13 0732  NA 137 140 140 136*  K 4.3 3.4* 3.3* 3.4*  CL 94* 97 98 95*  CO2 25 26 27 24   GLUCOSE 135* 150* 127* 110*  BUN 26* 17 29* 39*  CREATININE 4.04* 3.14* 4.25* 5.27*  CALCIUM 9.0 8.7 8.7 9.1  PHOS 2.4  --   --  2.1*   Liver Function Tests:  Recent Labs Lab 05/12/13 1311 05/15/13 0928 05/18/13 0732  AST 9  --   --   ALT <5  --   --   ALKPHOS 106  --   --   BILITOT 0.4  --   --   PROT 6.0  --   --  ALBUMIN 2.0* 2.1* 1.8*    Recent Labs Lab 05/12/13 1311  LIPASE 23   CBC:  Recent Labs Lab 05/12/13 1311 05/13/13 0534 05/15/13 0928 05/16/13 0600 05/18/13 0731  WBC 5.1 11.7* 15.5* 14.1* 16.2*  NEUTROABS 4.3  --   --   --   --   HGB 16.6 9.6* 10.3* 10.3* 9.7*  HCT 49.9 29.6* 31.8* 32.4* 30.1*  MCV 93.6 92.8 93.5 95.0 93.5  PLT 141* 318 425* 351 322  CBG:  Recent Labs Lab 05/17/13 2147 05/18/13 1134 05/18/13 1620 05/18/13 2115 05/19/13 0740  GLUCAP 97 128* 185* 120* 141*   Medications:   . amiodarone  100 mg Oral BID  . darbepoetin (ARANESP) injection - DIALYSIS  100 mcg Intravenous Q Wed-HD  . enoxaparin (LOVENOX) injection  30 mg Subcutaneous Q24H  . escitalopram  10 mg Oral Daily  . feeding supplement (NEPRO CARB STEADY)  237 mL Oral Q24H  . gabapentin  300 mg Oral QHS  . insulin aspart  0-15 Units Subcutaneous TID WC  . insulin aspart  0-5 Units Subcutaneous QHS  . levothyroxine   25 mcg Oral QAC breakfast  . lidocaine-prilocaine  1 application Topical Once per day on Mon Wed Fri  . metoprolol  100 mg Oral BID  . piperacillin-tazobactam (ZOSYN)  IV  2.25 g Intravenous Q8H  . potassium chloride  20 mEq Oral Once  . sodium chloride  3 mL Intravenous Q12H  . vancomycin  750 mg Intravenous Q M,W,F-HD  . Warfarin - Pharmacist Dosing Inpatient   Does not apply [id number]

## 2013-05-19 NOTE — Progress Notes (Signed)
Physical Therapy Treatment Patient Details Name: Chris Vance MRN: [id number] DOB: 1940-04-30 Today's Date: 05/19/2013    History of Present Illness Chris Vance is a 73 y.o. 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 Sharol Given of orthopedic surgery. He is presently a nursing home resident, transferred to the emergent apartment at [facility] for further evaluation of a left foot ulcer.  Patient with osteomyelitis.  Is s/p Lt foot 5th toe and ray amputation - NWB LLE.    PT Comments    Progressing with mobilizing self despite initially not interested.  Will need SNF level rehab at d/c.  PT to follow acutely.  Follow Up Recommendations  SNF     Equipment Recommendations  Wheelchair (measurements PT);Wheelchair cushion (measurements PT) (sliding board)    Recommendations for Other Services   None     Precautions / Restrictions Precautions Precautions: Fall Precaution Comments: Patient reports he has fallen twice at the SNF. Restrictions LLE Weight Bearing: Non weight bearing    Mobility  Bed Mobility Overal bed mobility: Needs Assistance Bed Mobility: Supine to Sit     Supine to sit: Min assist Sit to supine: Min assist   General bed mobility comments: slow moving with increased cues and time to allow patient to participate with scooting and coming up to sit due to initially not wanting to sit up  Transfers Overall transfer level: Needs assistance Equipment used:  (sliding board) Transfers: Lateral/Scoot Transfers          Lateral/Scoot Transfers: With slide board;Mod assist;+2 safety/equipment General transfer comment: assist with board to scoot to chair level transfer with cues for technique, assist for balance, and +2 for safety with blocking  chair, etc.  overall pt needs about 50% assist         Balance Overall balance assessment: History of Falls;Needs assistance   Sitting balance-Leahy Scale: Fair Sitting balance - Comments: sat edge of bed about 10 minutes slowly scooting to ege of bed eventually with assist                            Cognition Arousal/Alertness: Awake/alert Behavior During Therapy: [facility] for tasks assessed/performed   Area of Impairment: Memory;Problem solving       Following Commands: Follows one step commands with increased time     Problem Solving: Slow processing;Decreased initiation;Requires verbal cues;Requires tactile cues      Exercises General Exercises - Lower Extremity Heel Slides: AROM;Both;10 reps;Supine Toe Raises: AAROM;Left;10 reps;Supine            Pertinent Vitals/Pain Min c/o pain in leg with handling           PT Goals (current goals can now be found in the care plan section) Progress towards PT goals: Progressing toward goals    Frequency  Min 2X/week    PT Plan Current plan remains appropriate    End of Session Equipment Utilized During Treatment: Gait belt Activity Tolerance: Patient limited by fatigue Patient left: in chair;with call bell/phone within reach     Time: 1005-1033 PT Time Calculation (min): 28 min  Charges:  $Therapeutic Exercise: 8-22 mins $Therapeutic Activity: 8-22 mins                    G Codes:  Clester Chlebowski,CYNDI 05/19/2013, 11:47 AM Magda Kiel, PT [phone] 05/19/2013

## 2013-05-19 NOTE — Progress Notes (Signed)
Agree with dietetic intern note and intervention. Pryor Ochoa RD, LDN Inpatient Clinical Dietitian Pager: [phone] After Hours Pager: [phone]

## 2013-05-19 NOTE — Progress Notes (Signed)
NUTRITION FOLLOW UP  Pt meets criteria for moderate MALNUTRITION in the context of chronic illness as evidenced by moderate fat mass loss and severe muscle mass loss.  DOCUMENTATION CODES  Per approved criteria   -Non-severe (moderate) malnutrition in the context of chronic illness    Intervention: Continue Nepro Shake. Provide Nepro BID between meals, each supplement provides 425 kcal and 19 grams of protein.  Nutrition Dx:   Increased nutrient needs related to HD and wound healing as evidenced by estimated needs; Ongoing  Goal:   Intake to meet >90% of estimated nutrition needs, unmet  Monitor:   weight trends, lab trends, I/O's, PO intake, supplement tolerance  Assessment:   PMHx significant for DM, ESRD on HD MWF, osteomyelitis/gangrene R ankle s/p BKA. From SNF. Admitted with L foot ulcer, pt has had increased drainage x 1 week. Work-up reveals diabetic gangrenous ulcer of L fifth metatarsal.  3/20-PROCEDURE: Procedure(s):  AMPUTATION metatarsals fourth and fifth left foot  3/24-Pt reports with a decreased appetite. He reports he is unsure of why he has no appetite. Pt reports eating ~50% of each meal. Per EP[facility] records, pt meal completion 50-75%. Pt reports drinking his Nepro drinks, but reports he would like them two a day instead of once daily--will modify order. Noted Prostat was discontinued. Pt reports he does not like the Prostat and refuses to take them. Pt was encouraged to eat and drink his Nepro shakes. Pt denies any stomach pains, nausea, or difficulties chewing or swallowing. Plans for pt to go back to SNF upon discharge.  Labs: Low sodium, potassium, phosphorous, chloride, albumin, and GFR. High glucose, BUN, and creatinine  Height: Ht Readings from Last 1 Encounters:  05/12/13 5' 10.08" (1.78 m)    Weight Status:   Wt Readings from Last 1 Encounters:  05/18/13 145 lb 11.6 oz (66.1 kg)  05/17/13 150 lb 05/15/13 146 lb 05/13/13 145 lb Admit wt (05/12/13)152  lb  Re-estimated needs:  Kcal: 1900 - 2100  Protein: 85-95 grams  Fluid: 1.2 liters daily  Skin: Left foot ulcers  Diet Order: Diabetic and Renal with 1200 ml fluid   Intake/Output Summary (Last 24 hours) at 05/19/13 1344 Last data filed at 05/19/13 1100  Gross per 24 hour  Intake    480 ml  Output      0 ml  Net    480 ml    Last BM: 3/24-loose   Labs:   Recent Labs Lab 05/15/13 0928 05/16/13 0600 05/17/13 0530 05/18/13 0732  NA 137 140 140 136*  K 4.3 3.4* 3.3* 3.4*  CL 94* 97 98 95*  CO2 25 26 27 24   BUN 26* 17 29* 39*  CREATININE 4.04* 3.14* 4.25* 5.27*  CALCIUM 9.0 8.7 8.7 9.1  PHOS 2.4  --   --  2.1*  GLUCOSE 135* 150* 127* 110*    CBG (last 3)   Recent Labs  05/18/13 2115 05/19/13 0740 05/19/13 1240  GLUCAP 120* 141* 181*    Scheduled Meds: . amiodarone  100 mg Oral BID  . darbepoetin (ARANESP) injection - DIALYSIS  100 mcg Intravenous Q Wed-HD  . enoxaparin (LOVENOX) injection  30 mg Subcutaneous Q24H  . escitalopram  10 mg Oral Daily  . feeding supplement (NEPRO CARB STEADY)  237 mL Oral Q24H  . gabapentin  300 mg Oral QHS  . insulin aspart  0-15 Units Subcutaneous TID WC  . insulin aspart  0-5 Units Subcutaneous QHS  . levothyroxine  25 mcg Oral QAC  breakfast  . lidocaine-prilocaine  1 application Topical Once per day on Mon Wed Fri  . metoprolol  100 mg Oral BID  . piperacillin-tazobactam (ZOSYN)  IV  2.25 g Intravenous Q8H  . potassium chloride  20 mEq Oral Once  . sodium chloride  3 mL Intravenous Q12H  . vancomycin  750 mg Intravenous Q M,W,F-HD  . warfarin  2 mg Oral ONCE-1800  . Warfarin - Pharmacist Dosing Inpatient   Does not apply q1800    Continuous Infusions:   Kallie Locks Dietetic Intern Pager: [phone]

## 2013-05-19 NOTE — Progress Notes (Signed)
ANTICOAGULATION CONSULT NOTE - Follow Up Consult  Pharmacy Consult for coumadin Indication: atrial fibrillation, VTE prophylaxis   Allergies  Allergen Reactions  . Ace Inhibitors Other (See Comments)    Unknown   . Lisinopril Cough    Patient Measurements: Height: 5' 10.08" (178 cm) Weight: 145 lb 11.6 oz (66.1 kg) IBW/kg (Calculated) : 73.18   Vital Signs: Temp: 98 F (36.7 C) (03/24 0616) Temp src: Oral (03/24 0616) BP: 121/63 mmHg (03/24 0616) Pulse Rate: 88 (03/24 0616)  Labs:  Recent Labs  05/17/13 0530 05/18/13 0500 05/18/13 0731 05/18/13 0732 05/19/13 0635  HGB  --   --  9.7*  --   --   HCT  --   --  30.1*  --   --   PLT  --   --  322  --   --   LABPROT 20.6* 19.8*  --   --  21.5*  INR 1.83* 1.74*  --   --  1.93*  CREATININE 4.25*  --   --  5.27*  --     Estimated Creatinine Clearance: 11.8 ml/min (by C-G formula based on Cr of 5.27).  Assessment: Patient is a 73 y.o Mo n coumadin for hx afib and VTE prophylaxis.  INR is subtherapeutic but increased nicely from 1.74 to 1.93 after dose increased to 2mg  yesterday.  No bleeding documented.  Goal of Therapy:  INR 2-3    Plan:  1) coumadin 2mg  PO x1 today  Tikesha Mort P 05/19/2013,10:33 AM

## 2013-05-20 ENCOUNTER — Inpatient Hospital Stay (HOSPITAL_COMMUNITY): Payer: Medicare Other

## 2013-05-20 LAB — RENAL FUNCTION PANEL
Albumin: 1.9 g/dL — ABNORMAL LOW (ref 3.5–5.2)
BUN: 31 mg/dL — ABNORMAL HIGH (ref 6–23)
CO2: 23 mEq/L (ref 19–32)
Calcium: 9.2 mg/dL (ref 8.4–10.5)
Chloride: 94 mEq/L — ABNORMAL LOW (ref 96–112)
Creatinine, Ser: 4.77 mg/dL — ABNORMAL HIGH (ref 0.50–1.35)
GFR calc Af Amer: 13 mL/min — ABNORMAL LOW (ref >90)
GFR calc non Af Amer: 11 mL/min — ABNORMAL LOW (ref >90)
Glucose, Bld: 132 mg/dL — ABNORMAL HIGH (ref 70–99)
Phosphorus: 2.9 mg/dL (ref 2.3–4.6)
Potassium: 4.4 mEq/L (ref 3.7–5.3)
Sodium: 137 mEq/L (ref 137–147)

## 2013-05-20 LAB — CBC
HCT: 31.4 % — ABNORMAL LOW (ref 39.0–52.0)
Hemoglobin: 10.1 g/dL — ABNORMAL LOW (ref 13.0–17.0)
MCH: 30.1 pg (ref 26.0–34.0)
MCHC: 32.2 g/dL (ref 30.0–36.0)
MCV: 93.7 fL (ref 78.0–100.0)
Platelets: 460 10*3/uL — ABNORMAL HIGH (ref 150–400)
RBC: 3.35 MIL/uL — ABNORMAL LOW (ref 4.22–5.81)
RDW: 16.7 % — ABNORMAL HIGH (ref 11.5–15.5)
WBC: 19.5 10*3/uL — ABNORMAL HIGH (ref 4.0–10.5)

## 2013-05-20 LAB — PROTIME-INR
INR: 1.84 — ABNORMAL HIGH (ref 0.00–1.49)
Prothrombin Time: 20.7 seconds — ABNORMAL HIGH (ref 11.6–15.2)

## 2013-05-20 LAB — GLUCOSE, CAPILLARY
GLUCOSE-CAPILLARY: 123 mg/dL — AB (ref 70–99)
GLUCOSE-CAPILLARY: 287 mg/dL — AB (ref 70–99)
GLUCOSE-CAPILLARY: 102 mg/dL — AB (ref 70–99)
Glucose-Capillary: 154 mg/dL — ABNORMAL HIGH (ref 70–99)

## 2013-05-20 MED ORDER — WARFARIN SODIUM 2 MG PO TABS
2.0000 mg | ORAL_TABLET | Freq: Once | ORAL | Status: AC
Start: 2013-05-20 — End: 2013-05-20
  Administered 2013-05-20: 2 mg via ORAL
  Filled 2013-05-20: qty 1

## 2013-05-20 MED ORDER — DARBEPOETIN ALFA-POLYSORBATE 100 MCG/0.5ML IJ SOLN
INTRAMUSCULAR | Status: AC
  Administered 2013-05-20: 100 ug via INTRAVENOUS
  Filled 2013-05-20: qty 0.5

## 2013-05-20 NOTE — Progress Notes (Signed)
ANTICOAGULATION CONSULT NOTE - Follow Up Consult  Pharmacy Consult for coumadin Indication: atrial fibrillation, VTE prophylaxis   Allergies  Allergen Reactions  . Ace Inhibitors Other (See Comments)    Unknown   . Lisinopril Cough    Patient Measurements: Height: 5' 10.08" (178 cm) Weight: 138 lb 14.2 oz (63 kg) IBW/kg (Calculated) : 73.18   Vital Signs: Temp: 97.8 F (36.6 C) (03/25 0811) Temp src: Oral (03/25 0811) BP: 113/63 mmHg (03/25 0900) Pulse Rate: 88 (03/25 0900)  Labs:  Recent Labs  05/18/13 0500 05/18/13 0731 05/18/13 0732 05/19/13 0635 05/20/13 0800 05/20/13 0814  HGB  --  9.7*  --   --   --  10.1*  HCT  --  30.1*  --   --   --  31.4*  PLT  --  322  --   --   --  460*  LABPROT 19.8*  --   --  21.5* 20.7*  --   INR 1.74*  --   --  1.93* 1.84*  --   CREATININE  --   --  5.27*  --   --  4.77*    Estimated Creatinine Clearance: 12.5 ml/min (by C-G formula based on Cr of 4.77).  Assessment: Patient is a 72 y.o M on coumadin for hx afib.  INR decreased slightly to 1.84 today.  No bleeding documented.  Goal of Therapy:  INR 2-3    Plan:  1) repeat coumadin 2mg  PO x1 today.  Will plan on increasing dose if INR continues to trend down tomorrow 2) d/c lovenox when INR>2  Chris Vance P 05/20/2013,9:37 AM

## 2013-05-20 NOTE — Clinical Social Work Note (Signed)
CSW talked with patient about his continued need for ST rehab and the facility willing to accept him - [facility]. Patient also advised that his dialysis center will be changed while at [facility], then will be changed back once he completes rehab and is ready to return home. Patient expressed understanding and his questions were answered.  Patient was unable to be seen by [facility] worker today at 10:30 as patient was in dialysis. Financial counselor contact and another appointment made for Thursday, 3/26 at 9 am. CSW talked with dialysis staff person Bethena Roys regarding setting up dialysis in [place]. CSW contacted admissions staff at [facility] and provided update. CSW informed that they will be able to accept patient on 3/26.  Mitchell Iwanicki Givens, MSW, LCSW [phone]

## 2013-05-20 NOTE — Progress Notes (Signed)
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 Cellulitis of the fifth  toe and surrounding the base of the fifth metatarsal. The bones are  exposed at that joint and edema in the bones most likely represents  Osteomyelitis." -Continue broad-spectrum IV antibiotics with vancomycin and Zosyn, WBC is still elevated to 19000, he is afebrile. - Cultures from SNF with MSSA and group B strep -s/p-surgery/ray amputation 3/20 -discussed with Dr Erlinda Hong 3/22, and he states ok for DC from ortho standpoint >>he recommends doxy or bactrim on discharge for 2wks and to follow up with Dr Sharol Given in 2weeks -awaiting SNF as pt does not want to return to [facility]   End stage renal disease  -Dialysis per renal, and MWF  Type 2 diabetes mellitus -Continue monitoring Accu-Cheks and follow with sliding scale insulin  Hypertension -BP controlled, Continue metoprolol  Atrial fibrillation  -Rate controlled on metoprolol and amiodarone -INR trending up, Coumadin resumed 3/21- per Dr Erlinda Hong ok to resume. -Lovenox for DVT prophylaxis while INR is subtherapeutic  Diastolic CHF, chronic  -Compensated -Volume control continuing with dialysis per renal  Peripheral vascular disease  Hx of right BKA    Code Status: Full code Family Communication: None at bedside Disposition Plan: Back to SNF when bed available   Consultants:  Orthopedics  Renal  Procedures:  None  Antibiotics:  Vancomycin started 3/17  Zosyn started 3/17  HPI/Subjective: Patient seen and examined, denies any complaints.  Objective: Filed Vitals:   05/20/13 1248  BP: 136/58  Pulse: 98  Temp: 97.8 F (36.6 C)  Resp: 18    Intake/Output Summary (Last 24 hours)  at 05/20/13 1505 Last data filed at 05/20/13 1500  Gross per 24 hour  Intake    243 ml  Output   1998 ml  Net  -1755 ml   Filed Weights   05/19/13 2229 05/20/13 0811 05/20/13 1220  Weight: 62.65 kg (138 lb 1.9 oz) 63 kg (138 lb 14.2 oz) 61.1 kg (134 lb 11.2 oz)    Exam:  Physical Exam: Head: Normocephalic, atraumatic.  Eyes: No signs of jaundice, EOMI Nose: Mucous membranes dry.  Throat: Oropharynx nonerythematous, no exudate appreciated.  Neck: supple,No deformities, masses, or tenderness noted. Lungs: Normal respiratory effort. B/L Clear to auscultation, no crackles or wheezes.  Heart: Regular RR. S1 and S2 normal  Abdomen: BS normoactive. Soft, Nondistended, non-tender.  Extremities: No pretibial edema, no erythema   Data Reviewed: Basic Metabolic Panel:  Recent Labs Lab 05/15/13 0928 05/16/13 0600 05/17/13 0530 05/18/13 0732 05/20/13 0814  NA 137 140 140 136* 137  K 4.3 3.4* 3.3* 3.4* 4.4  CL 94* 97 98 95* 94*  CO2 25 26 27 24 23   GLUCOSE 135* 150* 127* 110* 132*  BUN 26* 17 29* 39* 31*  CREATININE 4.04* 3.14* 4.25* 5.27* 4.77*  CALCIUM 9.0 8.7 8.7 9.1 9.2  PHOS 2.4  --   --  2.1* 2.9   Liver Function Tests:  Recent Labs Lab 05/15/13 0928 05/18/13 0732 05/20/13 0814  ALBUMIN 2.1* 1.8* 1.9*   No results found for this basename: LIPASE, AMYLASE,  in the last 168 hours No results found for this basename: AMMONIA,  in the last 168 hours CBC:  Recent Labs Lab 05/15/13 0928 05/16/13 0600 05/18/13  3559 05/20/13 0814  WBC 15.5* 14.1* 16.2* 19.5*  HGB 10.3* 10.3* 9.7* 10.1*  HCT 31.8* 32.4* 30.1* 31.4*  MCV 93.5 95.0 93.5 93.7  PLT 425* 351 322 460*   Cardiac Enzymes: No results found for this basename: CKTOTAL, CKMB, CKMBINDEX, TROPONINI,  in the last 168 hours BNP (last 3 results)  Recent Labs  05/12/13 1311  PROBNP >70000.0*   CBG:  Recent Labs Lab 05/18/13 2115 05/19/13 0740 05/19/13 1240 05/19/13 1637 05/19/13 2228  GLUCAP 120*  141* 181* 129* 120*    No results found for this or any previous visit (from the past 240 hour(s)).   Studies: Dg Chest Port 1 View  05/20/2013   CLINICAL DATA:  Altered mental status, elevated white blood cell count, evaluate for pneumonia  EXAM: PORTABLE CHEST - 1 VIEW  COMPARISON:  DG CHEST 2 VIEW dated 05/12/2013; DG CHEST 1V PORT dated 04/20/2013; DG RIBS BILATERAL W/CHEST dated 01/30/2013  FINDINGS: Grossly unchanged enlarged cardiac silhouette and mediastinal contours with atherosclerotic plaque within the thoracic aorta. The pulmonary vasculature remains indistinct with cephalization of flow. Worsening bilateral mid and lower lung heterogeneous opacities, left greater than right. No definite pleural effusion or pneumothorax. Grossly unchanged bones.  IMPRESSION: Overall findings most suggestive of worsening asymmetry pulmonary edema though note, underlying infection is not excluded. Further evaluation with a PA and lateral chest radiograph may be obtained as clinically indicated.   Electronically Signed   By: Sandi Mariscal M.D.   On: 05/20/2013 14:40    Scheduled Meds: . amiodarone  100 mg Oral BID  . darbepoetin (ARANESP) injection - DIALYSIS  100 mcg Intravenous Q Wed-HD  . enoxaparin (LOVENOX) injection  30 mg Subcutaneous Q24H  . escitalopram  10 mg Oral Daily  . feeding supplement (NEPRO CARB STEADY)  237 mL Oral BID BM  . gabapentin  300 mg Oral QHS  . heparin  4,000 Units Dialysis Once in dialysis  . insulin aspart  0-15 Units Subcutaneous TID WC  . insulin aspart  0-5 Units Subcutaneous QHS  . levothyroxine  25 mcg Oral QAC breakfast  . lidocaine-prilocaine  1 application Topical Once per day on Mon Wed Fri  . metoprolol  100 mg Oral BID  . piperacillin-tazobactam (ZOSYN)  IV  2.25 g Intravenous Q8H  . potassium chloride  20 mEq Oral Once  . sodium chloride  3 mL Intravenous Q12H  . vancomycin  750 mg Intravenous Q M,W,F-HD  . warfarin  2 mg Oral ONCE-1800  . Warfarin -  Pharmacist Dosing Inpatient   Does not apply q1800   Continuous Infusions:   Principal Problem:   Diabetic ulcer of left foot Active Problems:   End stage renal disease   Peripheral vascular disease   Hx of right BKA   Atrial fibrillation   Diastolic CHF, chronic   Cellulitis    Time spent: [facility] Pager [phone]. If 7PM-7AM, please contact night-coverage at [email or web address], password [facility] 05/20/2013, 3:05 PM  LOS: 8 days

## 2013-05-20 NOTE — Progress Notes (Signed)
Patient ID: Chris Vance, male   DOB: Mar 29, 1940, 73 y.o.   MRN: [id number] Postoperative day 5 status post left foot fourth and fifth ray amputation. Dressing clean and dry. Patient is extremely weak and will need skilled nursing facility for strengthening and gait training.
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
## 2013-05-21 LAB — GLUCOSE, CAPILLARY
GLUCOSE-CAPILLARY: 156 mg/dL — AB (ref 70–99)
GLUCOSE-CAPILLARY: 123 mg/dL — AB (ref 70–99)
GLUCOSE-CAPILLARY: 139 mg/dL — AB (ref 70–99)
Glucose-Capillary: 277 mg/dL — ABNORMAL HIGH (ref 70–99)

## 2013-05-21 LAB — PROTIME-INR
INR: 2.54 — ABNORMAL HIGH (ref 0.00–1.49)
Prothrombin Time: 26.5 seconds — ABNORMAL HIGH (ref 11.6–15.2)

## 2013-05-21 LAB — CLOSTRIDIUM DIFFICILE BY PCR: Toxigenic C. Difficile by PCR: NEGATIVE

## 2013-05-21 MED ORDER — MIDODRINE HCL 5 MG PO TABS
10.0000 mg | ORAL_TABLET | ORAL | Status: AC
  Administered 2013-05-22: 10 mg via ORAL
  Filled 2013-05-21: qty 2

## 2013-05-21 MED ORDER — METOPROLOL TARTRATE 25 MG PO TABS
25.0000 mg | ORAL_TABLET | Freq: Two times a day (BID) | ORAL | Status: DC
  Administered 2013-05-22: 25 mg via ORAL
  Filled 2013-05-21 (×3): qty 1

## 2013-05-21 MED ORDER — WARFARIN SODIUM 1 MG PO TABS
1.0000 mg | ORAL_TABLET | Freq: Once | ORAL | Status: AC
  Administered 2013-05-21: 1 mg via ORAL
  Filled 2013-05-21: qty 1

## 2013-05-21 MED ORDER — SODIUM CHLORIDE 0.9 % IV BOLUS (SEPSIS)
250.0000 mL | Freq: Once | INTRAVENOUS | Status: AC
  Administered 2013-05-21: 250 mL via INTRAVENOUS

## 2013-05-21 NOTE — Progress Notes (Signed)
Patient's BP 90/50 HR 84.K. Schorr,NP notified,order received to hold tonight's dose of metoprolol and amiodarone. Sabriya Yono, Wonda Cheng, Therapist, sports

## 2013-05-21 NOTE — Progress Notes (Addendum)
[facility] Progress Note  Subjective:  Responds appropriately , no complaints  Objective Filed Vitals:   05/20/13 1621 05/20/13 2152 05/21/13 0548 05/21/13 0932  BP: 95/43 74/43 109/55 109/56  Pulse: 89 87 83 89  Temp: 98.9 F (37.2 C) 98 F (36.7 C) 98 F (36.7 C) 98.8 F (37.1 C)  TempSrc:  Oral Oral Oral  Resp: 17 18 16 19   Height:      Weight:      SpO2: 100% 83% 82% 95%   Physical Exam Gen: alert, mostly oriented Heart: irreg irreg  Lungs: no wheezes or rales  Abdomen: soft NT  Extremities: no edema , L foot toe amp site wrapped Dialysis Access: Right lower AVF   Dialysis: [facility] MWF  3h 43min 68.5kg 2/2.5 Bath Heparin 7000 RFA AVF  Hectorol none Epogen 2600   Problem/Plan:  1. 4/5th ray amp L foot for gangrene - Dr. Sharol Given 3/20 - IV Vanc and Zosyn, to get oral abx at d/c per ortho/primary 2. ESRD - MWF K 3.4- used 4 k bath Monday - HD Wed  3. AMS- acute on chronic, pt at baseline now 3. Anemia - Hgb 10.3;> 9.7> 10.1 Aranesp 100 q Wed  4. Secondary hyperparathyroidism - P 2.1 Corrected calcium 10.9= Hold phoslo - need to change to lower Ca bath at discharge  5. HTN/volume - dry wt down significantly to 61kg, vol stable now 6. Malnutrition - Alb 2.1>1.8 Renal diet + vit; drinking 2 Nepro a day - stopped prostat b/c he dislikes; losing weight -  7. Afib - BB, amio and coumadin being titrated by pharmacy  8. DM/ hypothyroidism - usual meds  9. Disp - to return to SNF [facility] today per [place], PA-C [facility] [phone] 05/21/2013,10:24 AM  LOS: 9 days   Pt seen, examined, agree w assess/plan as above with additions as indicated. Stable from renal standpoint, reportedly is returning to SNF today.  Kelly Splinter MD pager [phone]    cell [phone] 05/21/2013, 11:00 AM     Additional Objective Labs: Basic Metabolic Panel:  Recent Labs Lab 05/15/13 0928  05/17/13 0530 05/18/13 0732 05/20/13 0814  NA  137  < > 140 136* 137  K 4.3  < > 3.3* 3.4* 4.4  CL 94*  < > 98 95* 94*  CO2 25  < > 27 24 23   GLUCOSE 135*  < > 127* 110* 132*  BUN 26*  < > 29* 39* 31*  CREATININE 4.04*  < > 4.25* 5.27* 4.77*  CALCIUM 9.0  < > 8.7 9.1 9.2  PHOS 2.4  --   --  2.1* 2.9  < > = values in this interval not displayed. Liver Function Tests:  Recent Labs Lab 05/15/13 0928 05/18/13 0732 05/20/13 0814  ALBUMIN 2.1* 1.8* 1.9*   CBC:  Recent Labs Lab 05/15/13 0928 05/16/13 0600 05/18/13 0731 05/20/13 0814  WBC 15.5* 14.1* 16.2* 19.5*  HGB 10.3* 10.3* 9.7* 10.1*  HCT 31.8* 32.4* 30.1* 31.4*  MCV 93.5 95.0 93.5 93.7  PLT 425* 351 322 460*  Studies/Results: Dg Chest Port 1 View  05/20/2013   CLINICAL DATA:  Altered mental status, elevated white blood cell count, evaluate for pneumonia  EXAM: PORTABLE CHEST - 1 VIEW  COMPARISON:  DG CHEST 2 VIEW dated 05/12/2013; DG CHEST 1V PORT dated 04/20/2013; DG RIBS BILATERAL W/CHEST dated 01/30/2013  FINDINGS: Grossly unchanged enlarged cardiac silhouette and mediastinal contours with atherosclerotic plaque within the  thoracic aorta. The pulmonary vasculature remains indistinct with cephalization of flow. Worsening bilateral mid and lower lung heterogeneous opacities, left greater than right. No definite pleural effusion or pneumothorax. Grossly unchanged bones.  IMPRESSION: Overall findings most suggestive of worsening asymmetry pulmonary edema though note, underlying infection is not excluded. Further evaluation with a PA and lateral chest radiograph may be obtained as clinically indicated.   Electronically Signed   By: Sandi Mariscal M.D.   On: 05/20/2013 14:40   Medications:   . amiodarone  100 mg Oral BID  . darbepoetin (ARANESP) injection - DIALYSIS  100 mcg Intravenous Q Wed-HD  . enoxaparin (LOVENOX) injection  30 mg Subcutaneous Q24H  . escitalopram  10 mg Oral Daily  . feeding supplement (NEPRO CARB STEADY)  237 mL Oral BID BM  . gabapentin  300 mg Oral QHS   . heparin  4,000 Units Dialysis Once in dialysis  . insulin aspart  0-15 Units Subcutaneous TID WC  . insulin aspart  0-5 Units Subcutaneous QHS  . levothyroxine  25 mcg Oral QAC breakfast  . lidocaine-prilocaine  1 application Topical Once per day on Mon Wed Fri  . metoprolol  100 mg Oral BID  . piperacillin-tazobactam (ZOSYN)  IV  2.25 g Intravenous Q8H  . potassium chloride  20 mEq Oral Once  . sodium chloride  3 mL Intravenous Q12H  . vancomycin  750 mg Intravenous Q M,W,F-HD  . Warfarin - Pharmacist Dosing Inpatient   Does not apply [phone]

## 2013-05-21 NOTE — Progress Notes (Signed)
ANTICOAGULATION and ANTIBIOTIC CONSULT NOTE - Follow Up Consult  Pharmacy Consult for coumadin; vancomycin and zosyn Indication: atrial fibrillation; foot ulcer  Allergies  Allergen Reactions  . Ace Inhibitors Other (See Comments)    Unknown   . Lisinopril Cough    Patient Measurements: Height: 5' 10.08" (178 cm) Weight: 134 lb 11.2 oz (61.1 kg) IBW/kg (Calculated) : 73.18   Vital Signs: Temp: 98.8 F (37.1 C) (03/26 0932) Temp src: Oral (03/26 0932) BP: 109/56 mmHg (03/26 0932) Pulse Rate: 89 (03/26 0932)  Labs:  Recent Labs  05/19/13 0635 05/20/13 0800 05/20/13 0814 05/21/13 0441  HGB  --   --  10.1*  --   HCT  --   --  31.4*  --   PLT  --   --  460*  --   LABPROT 21.5* 20.7*  --  26.5*  INR 1.93* 1.84*  --  2.54*  CREATININE  --   --  4.77*  --     Estimated Creatinine Clearance: 12.1 ml/min (by C-G formula based on Cr of 4.77).   Assessment: Patient is a 73 y.o M on coumadin for hx Afib with plan to d/c to [facility] today.  INR is therapeutic at 2.54. He's also on vancomycin and zosyn for foot wound infection with plan to d/c on PO abx for 2 more weeks.  vanc 3/17>> Zosyn 3/17>>  No cultures  Goal of Therapy:  INR 2-3    Plan:  1) coumadin 1mg  x1 today (recommend 1mg  daily for discharge) 2) d/c lovenox since INR >2 3) with plan to d/c patient on PO abx today, will hold on on checking level for vancomycin   Braydee Shimkus P 05/21/2013,10:27 AM

## 2013-05-21 NOTE — Progress Notes (Signed)
PT Cancellation Note  Patient Details Name: Chris Vance MRN: [id number] DOB: 05-23-1940   Cancelled Treatment:    Reason Eval/Treat Not Completed: Patient declined, no reason specified; wants to eat lunch; said that supposed to go to rehab this afternoon, but was told by case mgr not leaving today.  Patient encouraged to sit in chair for lunch, continued to decline so set up lunch for him.   Trisa Cranor,CYNDI 05/21/2013, 2:34 PM

## 2013-05-21 NOTE — Progress Notes (Signed)
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 Cellulitis of the fifth  toe and surrounding the base of the fifth metatarsal. The bones are  exposed at that joint and edema in the bones most likely represents  Osteomyelitis." -Continue broad-spectrum IV antibiotics with vancomycin and Zosyn, WBC is still elevated to 19000, he is afebrile. - Cultures from SNF with MSSA and group B strep -s/p-surgery/ray amputation 3/20 -discussed with Dr Erlinda Hong 3/22, and he states ok for DC from ortho standpoint >>he recommends doxy or bactrim on discharge for 2wks and to follow up with Dr Sharol Given in 2weeks -awaiting SNF as pt does not want to return to [facility]   End stage renal disease  -Dialysis per renal, and MWF  Type 2 diabetes mellitus -Continue monitoring Accu-Cheks and follow with sliding scale insulin  Hypotesion -BP low, 250 NS bolus x 1 - BP has now improved.  Atrial fibrillation  -Rate controlled on metoprolol and amiodarone -INR trending up, Coumadin resumed 3/21- per Dr Erlinda Hong ok to resume.  DVT prophylaxis Lovenox while INR is subtherapeutic  Diastolic CHF, chronic  -Compensated -Volume control continuing with dialysis per renal  Peripheral vascular disease  Hx of right BKA    Code Status: Full code  Family Communication: None at bedside  Disposition Plan: Back to SNF when bed available   Consultants:  Orthopedics  Renal  Procedures:  None  Antibiotics:  Vancomycin started 3/17  Zosyn started 3/17  HPI/Subjective: Patient seen and examined, denies any complaints.  Objective: Filed Vitals:   05/21/13 1402  BP: 82/49  Pulse: 80  Temp: 98.2 F (36.8 C)  Resp: 16    Intake/Output Summary (Last  24 hours) at 05/21/13 1637 Last data filed at 05/21/13 1424  Gross per 24 hour  Intake    360 ml  Output      0 ml  Net    360 ml   Filed Weights   05/19/13 2229 05/20/13 0811 05/20/13 1220  Weight: 62.65 kg (138 lb 1.9 oz) 63 kg (138 lb 14.2 oz) 61.1 kg (134 lb 11.2 oz)    Exam:  Physical Exam: Head: Normocephalic, atraumatic.  Eyes: No signs of jaundice, EOMI Nose: Mucous membranes dry.  Throat: Oropharynx nonerythematous, no exudate appreciated.  Neck: supple,No deformities, masses, or tenderness noted. Lungs: Normal respiratory effort. B/L Clear to auscultation, no crackles or wheezes.  Heart: Regular RR. S1 and S2 normal  Abdomen: BS normoactive. Soft, Nondistended, non-tender.  Extremities: No pretibial edema, no erythema   Data Reviewed: Basic Metabolic Panel:  Recent Labs Lab 05/15/13 0928 05/16/13 0600 05/17/13 0530 05/18/13 0732 05/20/13 0814  NA 137 140 140 136* 137  K 4.3 3.4* 3.3* 3.4* 4.4  CL 94* 97 98 95* 94*  CO2 25 26 27 24 23   GLUCOSE 135* 150* 127* 110* 132*  BUN 26* 17 29* 39* 31*  CREATININE 4.04* 3.14* 4.25* 5.27* 4.77*  CALCIUM 9.0 8.7 8.7 9.1 9.2  PHOS 2.4  --   --  2.1* 2.9   Liver Function Tests:  Recent Labs Lab 05/15/13 0928 05/18/13 0732 05/20/13 0814  ALBUMIN 2.1* 1.8* 1.9*   No results found for this basename: LIPASE, AMYLASE,  in the last 168 hours No results found for this basename: AMMONIA,  in the last 168 hours CBC:  Recent Labs Lab 05/15/13 0928 05/16/13 0600 05/18/13 0731 05/20/13 0814  WBC 15.5* 14.1* 16.2* 19.5*  HGB 10.3* 10.3* 9.7* 10.1*  HCT 31.8* 32.4* 30.1* 31.4*  MCV 93.5 95.0 93.5 93.7  PLT 425* 351 322 460*   Cardiac Enzymes: No results found for this basename: CKTOTAL, CKMB, CKMBINDEX, TROPONINI,  in the last 168 hours BNP (last 3 results)  Recent Labs  05/12/13 1311  PROBNP >70000.0*   CBG:  Recent Labs Lab 05/20/13 1310 05/20/13 1618 05/20/13 2232 05/21/13 0745 05/21/13 1211   GLUCAP 154* 287* 102* 156* 277*    No results found for this or any previous visit (from the past 240 hour(s)).   Studies: Dg Chest Port 1 View  05/20/2013   CLINICAL DATA:  Altered mental status, elevated white blood cell count, evaluate for pneumonia  EXAM: PORTABLE CHEST - 1 VIEW  COMPARISON:  DG CHEST 2 VIEW dated 05/12/2013; DG CHEST 1V PORT dated 04/20/2013; DG RIBS BILATERAL W/CHEST dated 01/30/2013  FINDINGS: Grossly unchanged enlarged cardiac silhouette and mediastinal contours with atherosclerotic plaque within the thoracic aorta. The pulmonary vasculature remains indistinct with cephalization of flow. Worsening bilateral mid and lower lung heterogeneous opacities, left greater than right. No definite pleural effusion or pneumothorax. Grossly unchanged bones.  IMPRESSION: Overall findings most suggestive of worsening asymmetry pulmonary edema though note, underlying infection is not excluded. Further evaluation with a PA and lateral chest radiograph may be obtained as clinically indicated.   Electronically Signed   By: Sandi Mariscal M.D.   On: 05/20/2013 14:40    Scheduled Meds: . amiodarone  100 mg Oral BID  . darbepoetin (ARANESP) injection - DIALYSIS  100 mcg Intravenous Q Wed-HD  . escitalopram  10 mg Oral Daily  . feeding supplement (NEPRO CARB STEADY)  237 mL Oral BID BM  . gabapentin  300 mg Oral QHS  . heparin  4,000 Units Dialysis Once in dialysis  . insulin aspart  0-15 Units Subcutaneous TID WC  . insulin aspart  0-5 Units Subcutaneous QHS  . levothyroxine  25 mcg Oral QAC breakfast  . lidocaine-prilocaine  1 application Topical Once per day on Mon Wed Fri  . metoprolol  25 mg Oral BID  . [START ON 05/22/2013] midodrine  10 mg Oral Q Fri-HD  . piperacillin-tazobactam (ZOSYN)  IV  2.25 g Intravenous Q8H  . potassium chloride  20 mEq Oral Once  . sodium chloride  3 mL Intravenous Q12H  . vancomycin  750 mg Intravenous Q M,W,F-HD  . warfarin  1 mg Oral ONCE-1800  . Warfarin  - Pharmacist Dosing Inpatient   Does not apply q1800   Continuous Infusions:   Principal Problem:   Diabetic ulcer of left foot Active Problems:   End stage renal disease   Peripheral vascular disease   Hx of right BKA   Atrial fibrillation   Diastolic CHF, chronic   Cellulitis    Time spent: [facility] Pager [phone]. If 7PM-7AM, please contact night-coverage at [email or web address], password [facility] 05/21/2013, 4:37 PM  LOS: 9 days

## 2013-05-21 NOTE — Clinical Social Work Note (Signed)
CSW received call from [place] to follow-up on patient and she was updated about his pending discharge to a facility in [facility] due to using all of his Medicare days. Administrator was also informed that a Medicaid application was completed today with patient. Per Alwyn Ren, they are aware that he has used all of his Medicare days, and will take him back with a pending Medicaid application. CSW talked with patient about returning to [facility] and he is willing to return there, rather than go to a facility out of county [facility]), however he had concerns about how some of the staff talked with him and handled him. CSW offered support and assured patient that these concerns will be shared with admissions staff and the nursing director. CSW talked again with Alwyn Ren, Scientist, physiological of [facility] and shared patient's concerns. She was appreciative of receiving this information and assured CSW that patient's concerns would be addressed.  CSW contacted [place] with [facility] later today to inform her that patient will not discharge today due to diarrhea.   Josua Ferrebee Givens, MSW, LCSW [phone]

## 2013-05-22 ENCOUNTER — Other Ambulatory Visit: Payer: Self-pay

## 2013-05-22 LAB — CBC
HCT: 33.6 % — ABNORMAL LOW (ref 39.0–52.0)
Hemoglobin: 10.7 g/dL — ABNORMAL LOW (ref 13.0–17.0)
MCH: 29.9 pg (ref 26.0–34.0)
MCHC: 31.8 g/dL (ref 30.0–36.0)
MCV: 93.9 fL (ref 78.0–100.0)
Platelets: 424 10*3/uL — ABNORMAL HIGH (ref 150–400)
RBC: 3.58 MIL/uL — ABNORMAL LOW (ref 4.22–5.81)
RDW: 16.5 % — ABNORMAL HIGH (ref 11.5–15.5)
WBC: 18.1 10*3/uL — ABNORMAL HIGH (ref 4.0–10.5)

## 2013-05-22 LAB — PROTIME-INR
INR: 2.24 — ABNORMAL HIGH (ref 0.00–1.49)
Prothrombin Time: 24.1 seconds — ABNORMAL HIGH (ref 11.6–15.2)

## 2013-05-22 LAB — GLUCOSE, CAPILLARY
Glucose-Capillary: 110 mg/dL — ABNORMAL HIGH (ref 70–99)
Glucose-Capillary: 109 mg/dL — ABNORMAL HIGH (ref 70–99)
Glucose-Capillary: 240 mg/dL — ABNORMAL HIGH (ref 70–99)

## 2013-05-22 LAB — RENAL FUNCTION PANEL
Albumin: 1.7 g/dL — ABNORMAL LOW (ref 3.5–5.2)
BUN: 29 mg/dL — ABNORMAL HIGH (ref 6–23)
CO2: 27 mEq/L (ref 19–32)
Calcium: 9.1 mg/dL (ref 8.4–10.5)
Chloride: 96 mEq/L (ref 96–112)
Creatinine, Ser: 4.44 mg/dL — ABNORMAL HIGH (ref 0.50–1.35)
GFR calc Af Amer: 14 mL/min — ABNORMAL LOW (ref >90)
GFR calc non Af Amer: 12 mL/min — ABNORMAL LOW (ref >90)
Glucose, Bld: 120 mg/dL — ABNORMAL HIGH (ref 70–99)
Phosphorus: 4.4 mg/dL (ref 2.3–4.6)
Potassium: 4 mEq/L (ref 3.7–5.3)
Sodium: 140 mEq/L (ref 137–147)

## 2013-05-22 MED ORDER — OXYCODONE-ACETAMINOPHEN 5-325 MG PO TABS: 1.0000 | ORAL_TABLET | ORAL | Status: DC | PRN

## 2013-05-22 MED ORDER — SODIUM CHLORIDE 0.9 % IV SOLN: 100.0000 mL | INTRAVENOUS | Status: DC | PRN

## 2013-05-22 MED ORDER — WARFARIN SODIUM 2 MG PO TABS
2.0000 mg | ORAL_TABLET | Freq: Once | ORAL | Status: DC
  Filled 2013-05-22: qty 1

## 2013-05-22 MED ORDER — HEPARIN SODIUM (PORCINE) 1000 UNIT/ML DIALYSIS: 20.0000 [IU]/kg | INTRAMUSCULAR | Status: DC | PRN

## 2013-05-22 MED ORDER — NEPRO/CARBSTEADY PO LIQD
237.0000 mL | ORAL | Status: DC | PRN
  Filled 2013-05-22: qty 237

## 2013-05-22 MED ORDER — LIDOCAINE-PRILOCAINE 2.5-2.5 % EX CREA
1.0000 "application " | TOPICAL_CREAM | CUTANEOUS | Status: DC | PRN
  Filled 2013-05-22: qty 5

## 2013-05-22 MED ORDER — LOPERAMIDE HCL 2 MG PO TABS: 2.0000 mg | ORAL_TABLET | Freq: Four times a day (QID) | ORAL | Status: DC | PRN

## 2013-05-22 MED ORDER — LIDOCAINE HCL (PF) 1 % IJ SOLN: 5.0000 mL | INTRAMUSCULAR | Status: DC | PRN

## 2013-05-22 MED ORDER — ALTEPLASE 2 MG IJ SOLR
2.0000 mg | Freq: Once | INTRAMUSCULAR | Status: DC | PRN
  Filled 2013-05-22: qty 2

## 2013-05-22 MED ORDER — PENTAFLUOROPROP-TETRAFLUOROETH EX AERO: 1.0000 "application " | INHALATION_SPRAY | CUTANEOUS | Status: DC | PRN

## 2013-05-22 MED ORDER — MIDODRINE HCL 5 MG PO TABS
ORAL_TABLET | ORAL | Status: AC
  Filled 2013-05-22: qty 2

## 2013-05-22 MED ORDER — DOXYCYCLINE HYCLATE 50 MG PO CAPS: 100.0000 mg | ORAL_CAPSULE | Freq: Two times a day (BID) | ORAL | Status: AC

## 2013-05-22 MED ORDER — HEPARIN SODIUM (PORCINE) 1000 UNIT/ML DIALYSIS: 1000.0000 [IU] | INTRAMUSCULAR | Status: DC | PRN

## 2013-05-22 NOTE — Progress Notes (Signed)
PT Cancellation Note  Patient Details Name: VERSIE FLEENER MRN: [id number] DOB: [birth year]   Cancelled Treatment:    Reason Eval/Treat Not Completed: Other (comment); patient in HD all morning early afternoon.  Noted possible d/c.  Will check on pt on Monday if not d/c.   WYNN,CYNDI 05/22/2013, 5:00 PM

## 2013-05-22 NOTE — Progress Notes (Signed)
ANTICOAGULATION CONSULT NOTE - Follow Up Consult  Pharmacy Consult for coumadin Indication: atrial fibrillation  Allergies  Allergen Reactions  . Ace Inhibitors Other (See Comments)    Unknown   . Lisinopril Cough    Patient Measurements: Height: 5' 10.08" (178 cm) Weight: 138 lb 14.2 oz (63 kg) IBW/kg (Calculated) : 73.18   Vital Signs: Temp: 97.7 F (36.5 C) (03/27 1237) Temp src: Oral (03/27 1237) BP: 116/65 mmHg (03/27 1237) Pulse Rate: 91 (03/27 1237)  Labs:  Recent Labs  05/20/13 0800 05/20/13 0814 05/21/13 0441 05/22/13 0545 05/22/13 0858  HGB  --  10.1*  --   --  10.7*  HCT  --  31.4*  --   --  33.6*  PLT  --  460*  --   --  424*  LABPROT 20.7*  --  26.5* 24.1*  --   INR 1.84*  --  2.54* 2.24*  --   CREATININE  --  4.77*  --   --  4.44*    Estimated Creatinine Clearance: 13.4 ml/min (by C-G formula based on Cr of 4.44).  Assessment: Patient is a 73 y.o M on coumadin for hx afib with plan  to SNF today with doxycycline for 2 weeks for foot ulcer.  INR is therapeutic at 2.24 but decreased slightly from 2.54 yesterday.  No bleeding documented.  Goal of Therapy:  INR 2-3    Plan:  1) coumadin 2mg  PO x1 today if not discharge to SNF 2) d/c patient home on doxy for 2 weeks  Chris Vance P 05/22/2013,1:28 PM

## 2013-05-22 NOTE — Discharge Summary (Signed)
Physician Discharge Summary  Chris Vance:[id number] DOB: 12-24-1940 DOA: 05/12/2013  PCP: Tamsen Roers, MD  Admit date: 05/12/2013 Discharge date: 05/22/2013  Time spent: 50* minutes  Recommendations for Outpatient Follow-up:  *Follow up Dr Sharol Given in 2 weeks  Discharge Diagnoses:  Principal Problem:   Diabetic ulcer of left foot Active Problems:   End stage renal disease   Peripheral vascular disease   Hx of right BKA   Atrial fibrillation   Diastolic CHF, chronic   Cellulitis   Discharge Condition: *Stable  Diet recommendation: *diabetic diet  Filed Weights   05/20/13 0811 05/20/13 1220 05/22/13 0826  Weight: 63 kg (138 lb 14.2 oz) 61.1 kg (134 lb 11.2 oz) 63 kg (138 lb 14.2 oz)    History of present illness:  73 y.o. 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 Sharol Given of orthopedic surgery. He is presently a nursing home resident, transferred to the emergent apartment at [facility] for further evaluation of a left foot ulcer. It appears that small wound over the plantar left fifth metatarsal head was noted in January of 2015. patient was assessed about a week ago, noted to have sanguinous drainage from his wound. He was initially started on Augmentin therapy, then transitioned to vancomycin and Zosyn at dialysis. Cultures from skilled nursing facility growing methicillin stat sensitive Staphylococcus aureus and group B strep. Over the past week there has been rapid progressive deterioration of left foot ulcer.an x-ray of the foot performed in the emergency room showed no acute osseous abnormalities, findings consistent with a soft tissue ulcer no definite evidence of osteomyelitis. I have consulted Dr. Ninfa Linden of orthopedic surgery and have ordered an MRI of  his left foot. with his history of end-stage renal disease I also spoke with Dr. Joelyn Oms regarding patient's need for hemodialysis in the inpatient setting. Patient is a poor historian, however, he does not report fevers, chills, shortness of breath, chest pain, abdominal pain, nausea, vomiting   Hospital Course:  Diabetic gangrenous ulcer of left fifth metatarsal  -Initial plain x-ray in the ED was no evidence of osteomyelitis,  MRI showed  "septic fifth metatarsal phalangeal joint. Cellulitis of the fifth  toe and surrounding the base of the fifth metatarsal. The bones are  exposed at that joint and edema in the bones most likely represents  Osteomyelitis."  -Continueed broad-spectrum IV antibiotics with vancomycin and Zosyn,  - Cultures from SNF with MSSA and group B strep  -s/p-surgery/ray amputation 3/20  -discussed with Dr Erlinda Hong 3/22, and he states ok for DC from ortho standpoint >>he recommended  doxy  on discharge for 2wks and to follow up with Dr Sharol Given in 2weeks  - Will discharge him today on Po Doxycycline for two weeks.  End stage renal disease  -Dialysis per renal, and MWF   Type 2 diabetes mellitus  -Continue monitoring Accu-Cheks and follow with sliding scale insulin   Hypotesion  - Had episode of hypotension after HD - Resolved after bolus of normal saline 250 ml x 1 - BP has now improved.   Atrial fibrillation  -Rate controlled on metoprolol and amiodarone  -INR trending up, Coumadin resumed 3/21- per Dr Erlinda Hong ok to resume.    Diastolic CHF, chronic  -Compensated  -Volume control continuing with dialysis per renal   Peripheral vascular disease  Hx  of right BKA    Procedures:  *ray amputation   Consultations:  Orthopedics  Discharge Exam: Filed Vitals:   05/22/13 1008  BP: 113/59  Pulse: 84  Temp:   Resp:     General: *Appear in no acute distress Cardiovascular: S1s2 RRR Respiratory: *Clear bilaterally  Discharge Instructions  Discharge Orders    Chris Orders Complete By Expires   Change dressing  As directed    Scheduling Instructions:     Change dressing left foot as needed. May wash foot with soap and water.   Diet - low sodium heart healthy  As directed    Increase activity slowly  As directed    Touch down weight bearing  As directed    Questions:     Laterality:  left   Extremity:  Lower       Medication List    STOP taking these medications       amoxicillin-clavulanate 500-125 MG per tablet  Commonly known as:  AUGMENTIN     HYDROcodone-acetaminophen 5-325 MG per tablet  Commonly known as:  NORCO/VICODIN      TAKE these medications       acetaminophen 325 MG tablet  Commonly known as:  TYLENOL  Take 650 mg by mouth every 6 (six) hours as needed for mild pain or moderate pain (or Fever >/= 101).     amiodarone 100 MG tablet  Commonly known as:  PACERONE  Take 1 tablet (100 mg total) by mouth 2 (two) times daily.     calcium acetate 667 MG capsule  Commonly known as:  PHOSLO  Take 2,001 mg by mouth 3 (three) times daily with meals.     doxycycline 50 MG capsule  Commonly known as:  VIBRAMYCIN  Take 2 capsules (100 mg total) by mouth 2 (two) times daily.     escitalopram 10 MG tablet  Commonly known as:  LEXAPRO  Take 10 mg by mouth daily.     feeding supplement (PRO-STAT SUGAR FREE 64) Liqd  Take 30 mLs by mouth 2 (two) times daily.     gabapentin 300 MG capsule  Commonly known as:  NEURONTIN  Take 300 mg by mouth at bedtime.     insulin lispro 100 UNIT/ML injection  Commonly known as:  HUMALOG  Inject 2-10 Units into the skin 4 (four) times daily -  before meals and at bedtime.     levothyroxine 25 MCG tablet  Commonly known as:  SYNTHROID, LEVOTHROID  Take 25 mcg by mouth daily before breakfast.     lidocaine-prilocaine cream  Commonly known as:  EMLA  Apply 1 application topically 3 (three) times a week. Dialysis med     loperamide 2 MG tablet  Commonly known as:  IMODIUM A-D   Take 1 tablet (2 mg total) by mouth 4 (four) times daily as needed for diarrhea or loose stools.     metoprolol 100 MG tablet  Commonly known as:  LOPRESSOR  Take 100 mg by mouth 2 (two) times daily.     oxyCODONE-acetaminophen 5-325 MG per tablet  Commonly known as:  PERCOCET/ROXICET  Take 1-2 tablets by mouth every 4 (four) hours as needed for moderate pain.     warfarin 1 MG tablet  Commonly known as:  COUMADIN  Take 1 mg by mouth daily.       Allergies  Allergen Reactions  . Ace Inhibitors Other (See Comments)    Unknown   . Lisinopril Cough  Follow-up Information   Follow up with DUDA,MARCUS V, MD In 2 weeks.   Specialty:  Orthopedic Surgery   Contact information:   [place] [id number] [phone]        The results of significant diagnostics from this hospitalization (including imaging, microbiology, ancillary and laboratory) are listed below for reference.    Significant Diagnostic Studies: Dg Chest 2 View  05/12/2013   CLINICAL DATA:  Diabetes, hypertension, foot ulcer  EXAM: CHEST  2 VIEW  COMPARISON:  DG CHEST 1V PORT dated 04/20/2013; DG THORACIC SPINE dated 04/20/2013  FINDINGS: Moderate to severe cardiac enlargement with stable calcification of the aortic arch. Vascular congestion with mild diffuse interstitial prominence. Small bilateral pleural effusions.  IMPRESSION: Findings suggest congestive heart failure with mild interstitial pulmonary edema.   Electronically Signed   By: Skipper Cliche M.D.   On: 05/12/2013 13:51   Ct Head Wo Contrast  04/25/2013   CLINICAL DATA:  [place] out of bed this morning, laceration to forehead  EXAM: CT HEAD WITHOUT CONTRAST  CT MAXILLOFACIAL WITHOUT CONTRAST  TECHNIQUE: Multidetector CT imaging of the head and maxillofacial structures were performed using the standard protocol without intravenous contrast. Multiplanar CT image reconstructions of the maxillofacial structures were also generated.   COMPARISON:  CT HEAD W/O CM dated 04/20/2013; CT C SPINE W/O CM dated 04/20/2013  FINDINGS: CT HEAD FINDINGS  Age-related atrophy and small-vessel ischemic change to the white matter is stable. No hemorrhage, fracture, or extra-axial fluid. No infarct or mass effect.  CT MAXILLOFACIAL FINDINGS  Left maxillary incisor dentigerous cyst seen incidentally. Nondisplaced fracture at the bridge of the nose. No other facial bone fractures. Mild inflammatory change in the sinuses.  IMPRESSION: No acute intracranial abnormality. Nondisplaced fracture at the bridge of the nose. No other nasal bone fractures.   Electronically Signed   By: Skipper Cliche M.D.   On: 04/25/2013 10:59   Mr Foot Left Wo Contrast  05/15/2013   CLINICAL DATA:  Diabetic ulcer of left foot.  EXAM: MRI OF THE LEFT FOREFOOT WITHOUT CONTRAST  TECHNIQUE: Multiplanar, multisequence MR imaging was performed. No intravenous contrast was administered.  COMPARISON:  Radiographs dated 05/12/2013  FINDINGS: There is a deep ulceration at the lateral aspect of the fifth metatarsal phalangeal joint which exposes the head of the fifth metatarsal and the base of the proximal phalanx and the joint. There is an ill-defined fluid collection surrounding the head of the fifth metatarsal consistent with pus. There is slight fragmentation of the plantar aspect of the base of the proximal phalanx which is probably old. There is a soft tissue fluid collection adjacent to the tip of the tuft of the little toe which probably also represents pus.  The other bones of the forefoot demonstrate no acute abnormalities. Slight degenerative changes of the first metatarsal phalangeal joint.  IMPRESSION: Septic fifth metatarsal phalangeal joint. Cellulitis of the fifth toe and surrounding the base of the fifth metatarsal. The bones are exposed at that joint and edema in the bones most likely represents osteomyelitis.   Electronically Signed   By: Rozetta Nunnery M.D.   On: 05/15/2013 08:30    Dg Chest Port 1 View  05/20/2013   CLINICAL DATA:  Altered mental status, elevated white blood cell count, evaluate for pneumonia  EXAM: PORTABLE CHEST - 1 VIEW  COMPARISON:  DG CHEST 2 VIEW dated 05/12/2013; DG CHEST 1V PORT dated 04/20/2013; DG RIBS BILATERAL W/CHEST dated 01/30/2013  FINDINGS: Grossly unchanged enlarged  cardiac silhouette and mediastinal contours with atherosclerotic plaque within the thoracic aorta. The pulmonary vasculature remains indistinct with cephalization of flow. Worsening bilateral mid and lower lung heterogeneous opacities, left greater than right. No definite pleural effusion or pneumothorax. Grossly unchanged bones.  IMPRESSION: Overall findings most suggestive of worsening asymmetry pulmonary edema though note, underlying infection is not excluded. Further evaluation with a PA and lateral chest radiograph may be obtained as clinically indicated.   Electronically Signed   By: Sandi Mariscal M.D.   On: 05/20/2013 14:40   Dg Foot Complete Left  05/12/2013   CLINICAL DATA:  Ulcer lateral fifth metacarpophalangeal joint  EXAM: LEFT FOOT - COMPLETE 3+ VIEW  COMPARISON:  MR FOOT*L* W/O CM dated 02/01/2013  FINDINGS: Diffuse small vessel calcification. 4 mm foreign body along the sole underlying the distal fourth metacarpal. Soft tissue defect laterally over the fifth metacarpophalangeal joint. No periosteal reaction or cortical destruction. No fracture or dislocation.  IMPRESSION: No acute osseous abnormalities. Findings consistent with soft tissue ulcer with no definite evidence of osteomyelitis.  A foreign body is identified as described above.   Electronically Signed   By: Skipper Cliche M.D.   On: 05/12/2013 13:55   Ct Maxillofacial Wo Cm  04/25/2013   CLINICAL DATA:  [place] out of bed this morning, laceration to forehead  EXAM: CT HEAD WITHOUT CONTRAST  CT MAXILLOFACIAL WITHOUT CONTRAST  TECHNIQUE: Multidetector CT imaging of the head and maxillofacial structures were performed  using the standard protocol without intravenous contrast. Multiplanar CT image reconstructions of the maxillofacial structures were also generated.  COMPARISON:  CT HEAD W/O CM dated 04/20/2013; CT C SPINE W/O CM dated 04/20/2013  FINDINGS: CT HEAD FINDINGS  Age-related atrophy and small-vessel ischemic change to the white matter is stable. No hemorrhage, fracture, or extra-axial fluid. No infarct or mass effect.  CT MAXILLOFACIAL FINDINGS  Left maxillary incisor dentigerous cyst seen incidentally. Nondisplaced fracture at the bridge of the nose. No other facial bone fractures. Mild inflammatory change in the sinuses.  IMPRESSION: No acute intracranial abnormality. Nondisplaced fracture at the bridge of the nose. No other nasal bone fractures.   Electronically Signed   By: Skipper Cliche M.D.   On: 04/25/2013 10:59    Microbiology: Recent Results (from the past 240 hour(s))  CLOSTRIDIUM DIFFICILE BY PCR     Status: None   Collection Time    05/21/13  2:00 PM      Result Value Ref Range Status   C difficile by pcr NEGATIVE  NEGATIVE Final     Labs: Basic Metabolic Panel:  Recent Labs Lab 05/16/13 0600 05/17/13 0530 05/18/13 0732 05/20/13 0814 05/22/13 0858  NA 140 140 136* 137 140  K 3.4* 3.3* 3.4* 4.4 4.0  CL 97 98 95* 94* 96  CO2 26 27 24 23 27   GLUCOSE 150* 127* 110* 132* 120*  BUN 17 29* 39* 31* 29*  CREATININE 3.14* 4.25* 5.27* 4.77* 4.44*  CALCIUM 8.7 8.7 9.1 9.2 9.1  PHOS  --   --  2.1* 2.9 4.4   Liver Function Tests:  Recent Labs Lab 05/18/13 0732 05/20/13 0814 05/22/13 0858  ALBUMIN 1.8* 1.9* 1.7*   No results found for this basename: LIPASE, AMYLASE,  in the last 168 hours No results found for this basename: AMMONIA,  in the last 168 hours CBC:  Recent Labs Lab 05/16/13 0600 05/18/13 0731 05/20/13 0814 05/22/13 0858  WBC 14.1* 16.2* 19.5* 18.1*  HGB 10.3* 9.7* 10.1* 10.7*  HCT  32.4* 30.1* 31.4* 33.6*  MCV 95.0 93.5 93.7 93.9  PLT 351 322 460* 424*    Cardiac Enzymes: No results found for this basename: CKTOTAL, CKMB, CKMBINDEX, TROPONINI,  in the last 168 hours BNP: BNP (last 3 results)  Recent Labs  05/12/13 1311  PROBNP >70000.0*   CBG:  Recent Labs Lab 05/21/13 0745 05/21/13 1211 05/21/13 1642 05/21/13 1653 05/22/13 0739  GLUCAP 156* 277* 139* 123* 110*       Signed:  Zayon Trulson S  [facility] 05/22/2013, 10:41 AM

## 2013-05-22 NOTE — Progress Notes (Signed)
Called to give report to nurse at [facility]; Shontae, LPN. Discharge paperwork has been faxed to facility. IV removed. Awaiting Ambulance service to transfer pt to facility.

## 2013-05-22 NOTE — Progress Notes (Signed)
[facility] Progress Note  Subjective:   Seen post HD. No emerging c/o's.  D/c planned today. Amenable to returning to [facility]  Objective Filed Vitals:   05/22/13 1130 05/22/13 1200 05/22/13 1230 05/22/13 1237  BP: 110/61 105/74 111/73 116/65  Pulse: 88 92 89 91  Temp:    97.7 F (36.5 C)  TempSrc:    Oral  Resp:    18  Height:      Weight:    61 kg (134 lb 7.7 oz)  SpO2: 99% 99% 99% 98%   Physical Exam General: Alert, cooperative, appropriate Heart: Irreg Irreg Lungs: Coarse BS, no wheezes or rhonchi Abdomen: Soft, NT + BS Extremities: R BKA with scabbed ulcerations, L foot toe amp sites wrapped Dialysis Access: R AVF + bruit  Dialysis: [facility] MWF  3h 44min 68.5kg 2/2.5 Bath Heparin 7000 RFA AVF  Hectorol none Epogen 2600   Assessment/Plan: 1. 4/5th ray amp L foot for gangrene - Dr. Sharol Given 3/20 - IV Vanc and Zosyn narrowed to doxy at discharge per ortho/primary. F/u with ortho in 2 weeks op.  2. ESRD - MWF K 4.0, next HD Monday, outpatient 3. AMS - acute on chronic, pt at baseline now  4. Anemia - Hgb 10.7 on  Aranesp 100 q Wed. Cont op epo dose  5. Secondary hyperparathyroidism - Corrected calcium 10.9. Phos 4.4. Continue holding phoslo. Low Ca bath at d/c and change to non-Ca binder outpatient 6. HTN/volume - SBPs 110's. Midodrine given today pre-HD. Dry wt down significantly to ~61kg, vol stable now.  May need to decrease metop at d/c 7. Malnutrition - Alb 1.7,  Renal diet, multivit; drinking 2 Nepro a day - stopped prostat b/c he dislikes; losing weight   8. Afib - BB, amio and coumadin being titrated by pharmacy  9. DM/ hypothyroidism - usual meds  10. Disp - to return to SNF/[facility] today per SW note  Santiago Glad E. Cletus Gash, PA-C [facility] Pager [phone] 05/22/2013,1:47 PM  LOS: 10 days   Pt seen, examined, agree w assess/plan as above with additions as indicated.  Kelly Splinter MD pager [phone]    cell [phone] 05/22/2013, 6:20  PM     Additional Objective Labs: Basic Metabolic Panel:  Recent Labs Lab 05/18/13 0732 05/20/13 0814 05/22/13 0858  NA 136* 137 140  K 3.4* 4.4 4.0  CL 95* 94* 96  CO2 24 23 27   GLUCOSE 110* 132* 120*  BUN 39* 31* 29*  CREATININE 5.27* 4.77* 4.44*  CALCIUM 9.1 9.2 9.1  PHOS 2.1* 2.9 4.4   Liver Function Tests:  Recent Labs Lab 05/18/13 0732 05/20/13 0814 05/22/13 0858  ALBUMIN 1.8* 1.9* 1.7*   CBC:  Recent Labs Lab 05/16/13 0600 05/18/13 0731 05/20/13 0814 05/22/13 0858  WBC 14.1* 16.2* 19.5* 18.1*  HGB 10.3* 9.7* 10.1* 10.7*  HCT 32.4* 30.1* 31.4* 33.6*  MCV 95.0 93.5 93.7 93.9  PLT 351 322 460* 424*   Blood Culture    Component Value Date/Time   SDES BLOOD LEFT ARM 04/20/2013 1920   SPECREQUEST BOTTLES DRAWN AEROBIC AND ANAEROBIC 10 CC EACH BOTTLE 04/20/2013 1920   CULT  Value: NO GROWTH 5 DAYS Performed at [facility] 04/20/2013 1920   REPTSTATUS 04/27/2013 FINAL 04/20/2013 1920    CBG:  Recent Labs Lab 05/21/13 0745 05/21/13 1211 05/21/13 1642 05/21/13 1653 05/22/13 0739  GLUCAP 156* 277* 139* 123* 110*    Studies/Results: No results found.  Medications:   . amiodarone  100 mg  Oral BID  . darbepoetin (ARANESP) injection - DIALYSIS  100 mcg Intravenous Q Wed-HD  . escitalopram  10 mg Oral Daily  . feeding supplement (NEPRO CARB STEADY)  237 mL Oral BID BM  . gabapentin  300 mg Oral QHS  . insulin aspart  0-15 Units Subcutaneous TID WC  . insulin aspart  0-5 Units Subcutaneous QHS  . levothyroxine  25 mcg Oral QAC breakfast  . lidocaine-prilocaine  1 application Topical Once per day on Mon Wed Fri  . metoprolol  25 mg Oral BID  . midodrine      . piperacillin-tazobactam (ZOSYN)  IV  2.25 g Intravenous Q8H  . potassium chloride  20 mEq Oral Once  . sodium chloride  3 mL Intravenous Q12H  . vancomycin  750 mg Intravenous Q M,W,F-HD  . warfarin  2 mg Oral ONCE-1800  . Warfarin - Pharmacist Dosing Inpatient   Does not apply  [phone]

## 2013-05-22 NOTE — Telephone Encounter (Signed)
Rx faxed to [facility] @ [phone], phone number [phone]  

## 2013-05-27 ENCOUNTER — Emergency Department (HOSPITAL_COMMUNITY)
Admission: EM | Admit: 2013-05-27 | Discharge: 2013-05-27 | Disposition: A | Discharge status: Home / Self Care | Payer: Medicare Other | Attending: Emergency Medicine | Admitting: Emergency Medicine

## 2013-05-27 ENCOUNTER — Emergency Department (HOSPITAL_COMMUNITY): Payer: Medicare Other

## 2013-05-27 ENCOUNTER — Encounter (HOSPITAL_COMMUNITY): Payer: Self-pay | Admitting: Emergency Medicine

## 2013-05-27 DIAGNOSIS — Z7901 Long term (current) use of anticoagulants: Secondary | ICD-10-CM | POA: Insufficient documentation

## 2013-05-27 DIAGNOSIS — S42291A Other displaced fracture of upper end of right humerus, initial encounter for closed fracture: Secondary | ICD-10-CM

## 2013-05-27 DIAGNOSIS — Z792 Long term (current) use of antibiotics: Secondary | ICD-10-CM | POA: Insufficient documentation

## 2013-05-27 DIAGNOSIS — IMO0002 Reserved for concepts with insufficient information to code with codable children: Secondary | ICD-10-CM | POA: Insufficient documentation

## 2013-05-27 DIAGNOSIS — S0003XA Contusion of scalp, initial encounter: Secondary | ICD-10-CM | POA: Insufficient documentation

## 2013-05-27 DIAGNOSIS — Z992 Dependence on renal dialysis: Secondary | ICD-10-CM | POA: Insufficient documentation

## 2013-05-27 DIAGNOSIS — I120 Hypertensive chronic kidney disease with stage 5 chronic kidney disease or end stage renal disease: Secondary | ICD-10-CM | POA: Insufficient documentation

## 2013-05-27 DIAGNOSIS — Z794 Long term (current) use of insulin: Secondary | ICD-10-CM | POA: Insufficient documentation

## 2013-05-27 DIAGNOSIS — E119 Type 2 diabetes mellitus without complications: Secondary | ICD-10-CM | POA: Insufficient documentation

## 2013-05-27 DIAGNOSIS — S42001A Fracture of unspecified part of right clavicle, initial encounter for closed fracture: Secondary | ICD-10-CM

## 2013-05-27 DIAGNOSIS — Z85828 Personal history of other malignant neoplasm of skin: Secondary | ICD-10-CM | POA: Insufficient documentation

## 2013-05-27 DIAGNOSIS — F411 Generalized anxiety disorder: Secondary | ICD-10-CM | POA: Insufficient documentation

## 2013-05-27 DIAGNOSIS — Z79899 Other long term (current) drug therapy: Secondary | ICD-10-CM | POA: Insufficient documentation

## 2013-05-27 DIAGNOSIS — Y939 Activity, unspecified: Secondary | ICD-10-CM | POA: Insufficient documentation

## 2013-05-27 DIAGNOSIS — S42209A Unspecified fracture of upper end of unspecified humerus, initial encounter for closed fracture: Secondary | ICD-10-CM | POA: Insufficient documentation

## 2013-05-27 DIAGNOSIS — S1093XA Contusion of unspecified part of neck, initial encounter: Secondary | ICD-10-CM

## 2013-05-27 DIAGNOSIS — Z862 Personal history of diseases of the blood and blood-forming organs and certain disorders involving the immune mechanism: Secondary | ICD-10-CM | POA: Insufficient documentation

## 2013-05-27 DIAGNOSIS — W050XXA Fall from non-moving wheelchair, initial encounter: Secondary | ICD-10-CM | POA: Insufficient documentation

## 2013-05-27 DIAGNOSIS — Z87891 Personal history of nicotine dependence: Secondary | ICD-10-CM | POA: Insufficient documentation

## 2013-05-27 DIAGNOSIS — S0093XA Contusion of unspecified part of head, initial encounter: Secondary | ICD-10-CM

## 2013-05-27 DIAGNOSIS — E079 Disorder of thyroid, unspecified: Secondary | ICD-10-CM | POA: Insufficient documentation

## 2013-05-27 DIAGNOSIS — Y9210 Unspecified residential institution as the place of occurrence of the external cause: Secondary | ICD-10-CM | POA: Insufficient documentation

## 2013-05-27 DIAGNOSIS — F0390 Unspecified dementia without behavioral disturbance: Secondary | ICD-10-CM | POA: Insufficient documentation

## 2013-05-27 DIAGNOSIS — W1809XA Striking against other object with subsequent fall, initial encounter: Secondary | ICD-10-CM | POA: Insufficient documentation

## 2013-05-27 DIAGNOSIS — S0083XA Contusion of other part of head, initial encounter: Secondary | ICD-10-CM

## 2013-05-27 DIAGNOSIS — N186 End stage renal disease: Secondary | ICD-10-CM | POA: Insufficient documentation

## 2013-05-27 DIAGNOSIS — W19XXXA Unspecified fall, initial encounter: Secondary | ICD-10-CM

## 2013-05-27 DIAGNOSIS — S42009A Fracture of unspecified part of unspecified clavicle, initial encounter for closed fracture: Secondary | ICD-10-CM | POA: Insufficient documentation

## 2013-05-27 LAB — I-STAT CHEM 8, ED
BUN: 27 mg/dL — AB (ref 6–23)
CALCIUM ION: 1.13 mmol/L (ref 1.13–1.30)
CHLORIDE: 95 meq/L — AB (ref 96–112)
CREATININE: 5.4 mg/dL — AB (ref 0.50–1.35)
GLUCOSE: 133 mg/dL — AB (ref 70–99)
HCT: 38 % — ABNORMAL LOW (ref 39.0–52.0)
Hemoglobin: 12.9 g/dL — ABNORMAL LOW (ref 13.0–17.0)
POTASSIUM: 3.8 meq/L (ref 3.7–5.3)
Sodium: 134 mEq/L — ABNORMAL LOW (ref 137–147)
TCO2: 28 mmol/L (ref 0–100)

## 2013-05-27 LAB — PROTIME-INR
INR: 1.66 — ABNORMAL HIGH (ref 0.00–1.49)
Prothrombin Time: 19.1 seconds — ABNORMAL HIGH (ref 11.6–15.2)

## 2013-05-27 NOTE — ED Provider Notes (Addendum)
CSN: [id number]     Arrival date & time 05/27/13  1122 History   First MD Initiated Contact with Patient 05/27/13 1144     Chief Complaint  Patient presents with  . Fall   Level V caveat for dementia  (Consider location/radiation/quality/duration/timing/severity/associated sxs/prior Treatment) HPI Patient has end-stage renal disease and gets dialysis on Monday, Wednesday (today), and Fridays. He was at dialysis today and had a witnessed fall out of his wheelchair. It was reported he hit his head and his right shoulder. It is not noted if he had loss of consciousness. Per their notes patient has dementia and he sleeps a lot. Pt did not get his dialysis today. Pt does not know what happened.    PCP ? Dr Rex Kras  Past Medical History  Diagnosis Date  . Renal disorder   . Diabetes mellitus   . Hypertension   . Anemia   . Hyperlipidemia   . Thyroid disease   . Anxiety   . Skin cancer    Past Surgical History  Procedure Laterality Date  . Av fistula placement    . Amputation Right 02/13/2013    Procedure: Right Below Knee Amputation;  Surgeon: Newt Minion, MD;  Location: [place];  Service: Orthopedics;  Laterality: Right;  Right Below Knee Amputation  . Amputation Left 05/15/2013    Procedure: AMPUTATION metatarsal;  Surgeon: Newt Minion, MD;  Location: [place];  Service: Orthopedics;  Laterality: Left;   Family History  Problem Relation Age of Onset  . Asthma Father    History  Substance Use Topics  . Smoking status: Former Smoker -- 1.00 packs/day for 55 years    Types: Cigarettes    Start date: 02/26/1957  . Smokeless tobacco: Never Used     Comment: No cigarettes x 3-4 weeks.  . Alcohol Use: No  lives in NH  Review of Systems  Unable to perform ROS: Dementia      Allergies  Ace inhibitors and Lisinopril  Home Medications   Current Outpatient Rx  Name  Route  Sig  Dispense  Refill  . acetaminophen (TYLENOL) 325 MG tablet   Oral   Take 650 mg by mouth every 6  (six) hours as needed for mild pain or moderate pain (or Fever >/= 101).         . Amino Acids-Protein Hydrolys (FEEDING SUPPLEMENT, PRO-STAT SUGAR FREE 64,) LIQD   Oral   Take 30 mLs by mouth 2 (two) times daily.         Marland Kitchen amiodarone (PACERONE) 100 MG tablet   Oral   Take 1 tablet (100 mg total) by mouth 2 (two) times daily.         . calcium acetate (PHOSLO) 667 MG capsule   Oral   Take 2,001 mg by mouth 3 (three) times daily with meals.         Marland Kitchen doxycycline (VIBRAMYCIN) 50 MG capsule   Oral   Take 2 capsules (100 mg total) by mouth 2 (two) times daily.         Marland Kitchen escitalopram (LEXAPRO) 10 MG tablet   Oral   Take 10 mg by mouth daily.         Marland Kitchen gabapentin (NEURONTIN) 300 MG capsule   Oral   Take 300 mg by mouth at bedtime.         . insulin glargine (LANTUS) 100 UNIT/ML injection   Subcutaneous   Inject 58 Units into the skin at bedtime.         Marland Kitchen  insulin lispro (HUMALOG) 100 UNIT/ML injection   Subcutaneous   Inject 2-10 Units into the skin 4 (four) times daily -  before meals and at bedtime.         Marland Kitchen labetalol (NORMODYNE) 200 MG tablet   Oral   Take 100 mg by mouth 2 (two) times daily.         Marland Kitchen levothyroxine (SYNTHROID, LEVOTHROID) 25 MCG tablet   Oral   Take 25 mcg by mouth daily before breakfast.         . lidocaine-prilocaine (EMLA) cream   Topical   Apply 1 application topically 3 (three) times a week. Dialysis med         . loperamide (IMODIUM A-D) 2 MG tablet   Oral   Take 1 tablet (2 mg total) by mouth 4 (four) times daily as needed for diarrhea or loose stools.   30 tablet   0   . metoprolol (LOPRESSOR) 100 MG tablet   Oral   Take 100 mg by mouth 2 (two) times daily.         Marland Kitchen oxyCODONE-acetaminophen (PERCOCET/ROXICET) 5-325 MG per tablet   Oral   Take 1-2 tablets by mouth every 4 (four) hours as needed for moderate pain. 1-2 by mouth every 4 hours as needed for moderate pain DO NOT EXCEED 4GM OF TYLENOL IN 24 HOURS    180 tablet   0   . warfarin (COUMADIN) 1 MG tablet   Oral   Take 1 mg by mouth daily.          BP 129/65  Pulse 78  Temp(Src) 98.5 F (36.9 C) (Oral)  Resp 18  SpO2 99%  Vital signs normal   Physical Exam  Nursing note and vitals reviewed. Constitutional: He appears well-developed and well-nourished.  Non-toxic appearance. He does not appear ill. No distress.  HENT:  Head: Normocephalic.    Right Ear: External ear normal.  Left Ear: External ear normal.  Nose: Nose normal. No mucosal edema or rhinorrhea.  Mouth/Throat: Oropharynx is clear and moist and mucous membranes are normal. No dental abscesses or uvula swelling.  Pt has faint abrasion in his right upper forehead/hairline  Eyes: Conjunctivae and EOM are normal. Pupils are equal, round, and reactive to light.  Neck: Normal range of motion and full passive range of motion without pain. Neck supple.  Cardiovascular: Normal rate, regular rhythm and normal heart sounds.  Exam reveals no gallop and no friction rub.   No murmur heard. Pulmonary/Chest: Effort normal and breath sounds normal. No respiratory distress. He has no wheezes. He has no rhonchi. He has no rales. He exhibits no tenderness and no crepitus.  Abdominal: Soft. Normal appearance and bowel sounds are normal. He exhibits no distension. There is no tenderness. There is no rebound and no guarding.  Musculoskeletal: Normal range of motion. He exhibits no edema and no tenderness.  Moves all extremities well.  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 Pt is tender in his right shoulder with ROM with mild pain. No deformity noted.   Neurological: He is alert. He has normal strength. No cranial nerve deficit.  Pt does c/o pain in his shoulder and points to the right, otherwise is sleeping  Skin: Skin is warm, dry and intact. No rash noted. No erythema. There is pallor.  Psychiatric: He has a  normal mood and affect. His speech is normal and behavior  is normal. His mood appears not anxious.    ED Course  Procedures (including critical care time)  Pt placed in a sling. He is being referred back to Dr Sharol Given who did his amputation.   Patient did not make it to his dialysis today. His chest x-ray shows he is not in acute failure and his potassium is normal. His nursing home can arrange to have him dialyzed later today or tomorrow.  14:50 pt more awake, told he has fractured his clavicle and shoulder. States he thinks he bruised his head, advised he did.   Labs Review Results for orders placed during the hospital encounter of 05/27/13  PROTIME-INR      Result Value Ref Range   Prothrombin Time 19.1 (*) 11.6 - 15.2 seconds   INR 1.66 (*) 0.00 - 1.49  I-STAT CHEM 8, ED      Result Value Ref Range   Sodium 134 (*) 137 - 147 mEq/L   Potassium 3.8  3.7 - 5.3 mEq/L   Chloride 95 (*) 96 - 112 mEq/L   BUN 27 (*) 6 - 23 mg/dL   Creatinine, Ser 5.40 (*) 0.50 - 1.35 mg/dL   Glucose, Bld 133 (*) 70 - 99 mg/dL   Calcium, Ion 1.13  1.13 - 1.30 mmol/L   TCO2 28  0 - 100 mmol/L   Hemoglobin 12.9 (*) 13.0 - 17.0 g/dL   HCT 38.0 (*) 39.0 - 52.0 %    Laboratory interpretation all normal except renal failure with normal potassium, subtherapeutic INR   Imaging Review Dg Chest 1 View  05/27/2013   CLINICAL DATA:  Trauma  EXAM: CHEST - 1 VIEW  COMPARISON:  May 20, 2013  FINDINGS: Patient is markedly rotated. There has been partial but incomplete clearing of interstitial edema. There is no airspace consolidation. Heart is enlarged with pulmonary vascularity within normal limits. There is atherosclerotic change in aorta. No adenopathy. There is degenerative change in the thoracic spine.  IMPRESSION: There is less edema compared to recent prior study. Mild pulmonary edema remains. Cardiomegaly is present. There remains a degree of congestive heart failure with partial resolution radiographically  compared to recent prior study. No new opacity.   Electronically Signed   By: Lowella Grip M.D.   On: 05/27/2013 13:49   Dg Shoulder Right  05/27/2013   CLINICAL DATA:  Pain post trauma  EXAM: RIGHT SHOULDER - 2+ VIEW  COMPARISON:  April 20, 2013  FINDINGS: Frontal and Y scapular images were obtained. There is a fracture of the lateral right clavicle with mild displacement of fracture fragments. There is also a subtle fracture along the greater tuberosity of the proximal humerus. There is a healing fracture of the lateral right second rib. There are no other fractures. No dislocation.  Bones appear somewhat osteoporotic. There is elevation of the humeral head on the right suggesting chronic rotator cuff tear. There is mild generalized osteoarthritic change.  IMPRESSION: Fracture lateral right clavicle with mild displacement of fracture fragments. Nondisplaced fracture along the greater tuberosity of the proximal humerus. Healing fracture lateral right second rib. Evidence of chronic rotator cuff tear.   Electronically Signed   By: Lowella Grip M.D.   On: 05/27/2013 13:51   Ct Head Wo Contrast   Ct Cervical Spine Wo Contrast  05/27/2013   CLINICAL DATA:  Pain post trauma  EXAM: CT HEAD WITHOUT CONTRAST  CT CERVICAL SPINE WITHOUT CONTRAST  TECHNIQUE: Multidetector CT imaging of the head and cervical spine was performed  following the standard protocol without intravenous contrast. Multiplanar CT image reconstructions of the cervical spine were also generated.  COMPARISON:  April 20, 2013  FINDINGS: CT HEAD FINDINGS  There is moderate diffuse atrophy. There is no mass, acute hemorrhage, or midline shift. There is minimal subdural hygroma on the left, stable. There is no new extra-axial fluid. There is patchy small vessel disease in the centra semiovale bilaterally. There is no new gray-white compartment lesion. No acute infarct apparent.  Bony calvarium appears intact. The mastoid air cells are  clear. There is mild mucosal thickening in both inferior maxillary antra. There is debris in the left external auditory canal.  CT CERVICAL SPINE FINDINGS  There is no fracture or spondylolisthesis. Prevertebral soft tissues and predental space regions appear intact.  There is fairly marked disc space narrowing at C4-5, C5-6 and C6-7. There is milder narrowing at all other levels. There is multilevel facet hypertrophy. These changes are most marked at C3-4, C4-5, and C5-6 bilaterally. There is no frank disc extrusion or stenosis.  IMPRESSION: CT head: Stable atrophy with periventricular small vessel disease. Stable minimal subdural hygroma on the left. No acute hemorrhage or new extra-axial fluid. No midline shift. No evidence of acute appearing infarct. Probable cerumen in left external auditory canal, stable.  CT cervical spine: Multilevel spondylosis and osteoarthritic change bilaterally. There is significant exit foraminal narrowing at C3-4, C4-5, and C5-6 bilaterally due to bony hypertrophy. No disc extrusion or stenosis appreciable. No fracture or spondylolisthesis.   Electronically Signed   By: Lowella Grip M.D.   On: 05/27/2013 14:24     Dg Chest 2 View  05/12/2013   CLINICAL DATA:  Diabetes, hypertension, foot ulcer.  IMPRESSION: Findings suggest congestive heart failure with mild interstitial pulmonary edema.   Electronically Signed   By: Skipper Cliche M.D.   On: 05/12/2013 13:51   Mr Foot Left Wo Contrast  05/15/2013   CLINICAL DATA:  Diabetic ulcer of left foot.    IMPRESSION: Septic fifth metatarsal phalangeal joint. Cellulitis of the fifth toe and surrounding the base of the fifth metatarsal. The bones are exposed at that joint and edema in the bones most likely represents osteomyelitis.   Electronically Signed   By: Rozetta Nunnery M.D.   On: 05/15/2013 08:30   Dg Chest Port 1 View  05/20/2013   CLINICAL DATA:  Altered mental status, elevated white blood cell count, evaluate for  pneumonia   IMPRESSION: Overall findings most suggestive of worsening asymmetry pulmonary edema though note, underlying infection is not excluded. Further evaluation with a PA and lateral chest radiograph may be obtained as clinically indicated.   Electronically Signed   By: Sandi Mariscal M.D.   On: 05/20/2013 14:40   Dg Foot Complete Left  05/12/2013   CLINICAL DATA:  Ulcer lateral fifth metacarpophalangeal joint  IMPRESSION: No acute osseous abnormalities. Findings consistent with soft tissue ulcer with no definite evidence of osteomyelitis.  A foreign body is identified as described above.   Electronically Signed   By: Skipper Cliche M.D.   On: 05/12/2013 13:   EKG Interpretation None      MDM   Final diagnoses:  Right clavicle fracture  Fracture of humeral head, right, closed  Contusion of head  Fall    Plan discharge  Rolland Porter, MD, Alanson Aly, MD 05/27/13 [place] Kennady Zimmerle, MD 05/27/13 [place], MD 06/03/13 [phone]

## 2013-05-27 NOTE — ED Notes (Signed)
Pt return from CT and xray.

## 2013-05-27 NOTE — Discharge Instructions (Signed)
Please call his dialysis center to get another time for his dialysis. He has a fracture of his clavicle and the head of the humerus. He should wear the sling for comfort. Give him his pain medication as needed. He can be rechecked by his orthopedist, Dr Sharol Given,  call the office to get an appointment. He should return to the ED for any problems listed on the head injury sheet.    Clavicle Fracture A clavicle fracture is a break in the collarbone. This is a common injury, especially in children. Collarbones do not harden until around the age of 15. Most collarbone fractures are treated with a simple arm sling. In some cases a figure-of-eight splint is used to help hold the broken bones in position. Although not often needed, surgery may be required if the bone fragments are not in the correct position (displaced).  HOME CARE INSTRUCTIONS   Apply ice to the injury for 15-20 minutes each hour while awake for 2 days. Put the ice in a plastic bag and place a towel between the bag of ice and your skin.  Wear the sling or splint constantly for as long as directed by your caregiver. You may remove the sling or splint for bathing or showering. Be sure to keep your shoulder in the same place as when the sling or splint is on. Do not lift your arm.  If a figure-of-eight splint is applied, it must be tightened by another person every day. Tighten it enough to keep the shoulders held back. Allow enough room to place the index finger between the body and strap. Loosen the splint immediately if you feel numbness or tingling in your hands.  Only take over-the-counter or prescription medicines for pain, discomfort, or fever as directed by your caregiver.  Avoid activities that irritate or increase the pain for 4 to 6 weeks after surgery.  Follow all instructions for follow-up with your caregiver. This includes any referrals, physical therapy, and rehabilitation. Any delay in obtaining necessary care could result in a  delay or failure of the injury to heal properly. SEEK MEDICAL CARE IF:  You have pain and swelling that are not relieved with medications. SEEK IMMEDIATE MEDICAL CARE IF:  Your arm is numb, cold, or pale, even when the splint is loose. MAKE SURE YOU:   Understand these instructions.  Will watch your condition.  Will get help right away if you are not doing well or get worse. Document Released: 11/22/2004 Document Revised: 05/07/2011 Document Reviewed: 09/18/2007 [facility] Patient Information 2014 [place].  Head Injury, Adult You have received a head injury. It does not appear serious at this time. Headaches and vomiting are common following head injury. It should be easy to awaken from sleeping. Sometimes it is necessary for you to stay in the emergency department for a while for observation. Sometimes admission to the hospital may be needed. After injuries such as yours, most problems occur within the first 24 hours, but side effects may occur up to 7 10 days after the injury. It is important for you to carefully monitor your condition and contact your health care provider or seek immediate medical care if there is a change in your condition. WHAT ARE THE TYPES OF HEAD INJURIES? Head injuries can be as minor as a bump. Some head injuries can be more severe. More severe head injuries include:  A jarring injury to the brain (concussion).  A bruise of the brain (contusion). This mean there is bleeding in the  brain that can cause swelling.  A cracked skull (skull fracture).  Bleeding in the brain that collects, clots, and forms a bump (hematoma). WHAT CAUSES A HEAD INJURY? A serious head injury is most likely to happen to someone who is in a car wreck and is not wearing a seat belt. Other causes of major head injuries include bicycle or motorcycle accidents, sports injuries, and falls. HOW ARE HEAD INJURIES DIAGNOSED? A complete history of the event leading to the injury and your  current symptoms will be helpful in diagnosing head injuries. Many times, pictures of the brain, such as CT or MRI are needed to see the extent of the injury. Often, an overnight hospital stay is necessary for observation.  WHEN SHOULD I SEEK IMMEDIATE MEDICAL CARE?  You should get help right away if:  You have confusion or drowsiness.  You feel sick to your stomach (nauseous) or have continued, forceful vomiting.  You have dizziness or unsteadiness that is getting worse.  You have severe, continued headaches not relieved by medicine. Only take over-the-counter or prescription medicines for pain, fever, or discomfort as directed by your health care provider.  You do not have normal function of the arms or legs or are unable to walk.  You notice changes in the black spots in the center of the colored part of your eye (pupil).  You have a clear or bloody fluid coming from your nose or ears.  You have a loss of vision. During the next 24 hours after the injury, you must stay with someone who can watch you for the warning signs. This person should contact local emergency services (911 in the [place] if you have seizures, you become unconscious, or you are unable to wake up. HOW CAN I PREVENT A HEAD INJURY IN THE FUTURE? The most important factor for preventing major head injuries is avoiding motor vehicle accidents. To minimize the potential for damage to your head, it is crucial to wear seat belts while riding in motor vehicles. Wearing helmets while bike riding and playing collision sports (like football) is also helpful. Also, avoiding dangerous activities around the house will further help reduce your risk of head injury.  WHEN CAN I RETURN TO NORMAL ACTIVITIES AND ATHLETICS? You should be reevaluated by your health care provider before returning to these activities. If you have any of the following symptoms, you should not return to activities or contact sports until 1 week after the symptoms  have stopped:  Persistent headache.  Dizziness or vertigo.  Poor attention and concentration.  Confusion.  Memory problems.  Nausea or vomiting.  Fatigue or tire easily.  Irritability.  Intolerant of bright lights or loud noises.  Anxiety or depression.  Disturbed sleep. MAKE SURE YOU:   Understand these instructions.  Will watch your condition.  Will get help right away if you are not doing well or get worse. Document Released: 02/12/2005 Document Revised: 12/03/2012 Document Reviewed: 10/20/2012 [facility] Patient Information 2014 [place].

## 2013-05-27 NOTE — ED Notes (Signed)
Patient transported to X-ray 

## 2013-05-27 NOTE — ED Notes (Signed)
Per EMS, pt was at dialysis this am and came from [facility]. sts fell forward out of wheelchair. sts hit head and shoulder and head on concrete. Pt alert to norm. Right BKA. Hx of dementia. Tenderness to right shoulder. Pt c spine. Pt did not do dialysis today due to fall. CBG 153. BP 118/100. Pulse 72. RR 14. Pt hx of dementia and sts he falls asleep a lot

## 2013-05-27 NOTE — ED Notes (Signed)
Report called to [facility] and [facility] called to pick up pt.

## 2013-06-02 ENCOUNTER — Non-Acute Institutional Stay (SKILLED_NURSING_FACILITY): Payer: Medicare Other | Admitting: Internal Medicine

## 2013-06-02 DIAGNOSIS — E1169 Type 2 diabetes mellitus with other specified complication: Secondary | ICD-10-CM

## 2013-06-02 DIAGNOSIS — N185 Chronic kidney disease, stage 5: Secondary | ICD-10-CM

## 2013-06-02 DIAGNOSIS — A4901 Methicillin susceptible Staphylococcus aureus infection, unspecified site: Secondary | ICD-10-CM

## 2013-06-02 DIAGNOSIS — M9080 Osteopathy in diseases classified elsewhere, unspecified site: Secondary | ICD-10-CM

## 2013-06-02 DIAGNOSIS — M869 Osteomyelitis, unspecified: Secondary | ICD-10-CM

## 2013-06-02 DIAGNOSIS — E1159 Type 2 diabetes mellitus with other circulatory complications: Secondary | ICD-10-CM

## 2013-06-02 DIAGNOSIS — E1129 Type 2 diabetes mellitus with other diabetic kidney complication: Secondary | ICD-10-CM

## 2013-06-02 DIAGNOSIS — E1165 Type 2 diabetes mellitus with hyperglycemia: Secondary | ICD-10-CM

## 2013-06-03 NOTE — Progress Notes (Addendum)
Patient ID: Chris Vance, male   DOB: 1940-07-31, 73 y.o.   MRN: [id number]                   HISTORY & PHYSICAL  DATE:  06/02/2013     FACILITY: [facility]    LEVEL OF CARE:   SNF   CHIEF COMPLAINT:  Review, status post admission to [facility], 05/12/2013 through 05/22/2013.    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  He came to [place] initially with a small wound over the left fifth metatarsal head.  This became refractory to treatment and, at the time of discharge, it had become a rapidly progressive gangrenous wound.   Culture of this grew Staph aureus which was methicillin-sensitive, and group B strep.  In spite of what should have been appropriate IV antibiotics at dialysis, this progressed to a gangrenous wound encompassing the entire left fifth toe up to and including the left fifth metatarsophalangeal joint.  He was sent to the hospital for surgical review of this.  It appears that he underwent at least a left fifth ray amputation, possibly a fourth as well.   This was performed by Dr. Sharol Given.    Unfortunately, the patient has already been back in hospital after falling out of his chair at dialysis.  He has a fracture of the lateral right clavicle as well as a nondisplaced fracture of the proximal humerus.  He is felt to have evidence of chronic rotator cuff tear.  I am not completely certain whether he has been seen by Orthopaedics with regards to the right upper extremity fractures described.    PAST MEDICAL HISTORY/PROBLEM LIST:    Chronic renal failure.  On dialysis.  Secondary to type 2 diabetes.    Type 2 diabetes.    Atrial fibrillation.  On amiodarone and Coumadin.    Hypokalemia.  Possibly secondary to amiodarone.  On replacement at one point.    Long smoking history.    Diastolic heart failure.     CURRENT MEDICATIONS:  Medication list is reviewed.    Amiodarone 100 mg  b.i.d.    PhosLo 2000 mg three times daily with meals.    Doxycycline 50 mg, 2 capsules b.i.d.    Lexapro 10 mg daily.    Neurontin 300 at bedtime.    Humalog sliding scale a.c. meals.    Synthroid 25 mcg daily.    Lidocaine to his fistula site before dialysis.    Lopressor 100 b.i.d.    Percocet q.4 p.r.n.    SOCIAL HISTORY:   FUNCTIONAL STATUS:  The patient was actually independent before he came here in January.  His exact status is unclear.   CODE STATUS:  He has no advanced directives.    REVIEW OF SYSTEMS:   CHEST/RESPIRATORY:  No cough.  No shortness of breath.   CARDIAC:   No chest pain.   GI:  No nausea,  vomiting or abdominal pain.  No diarrhea.    PHYSICAL EXAMINATION:   GENERAL APPEARANCE:  An overall deteriorating course in the last few months that I have known him.  It looks as though he has lost some weight.   HEENT:   MOUTH/THROAT:   No oral lesions are seen.   CHEST/RESPIRATORY:  Clear air entry bilaterally.   CARDIOVASCULAR:  CARDIAC:   Heart sounds are regular.  There are no murmurs.  GASTROINTESTINAL:  ABDOMEN:   Soft.   LIVER/SPLEEN/KIDNEYS:  No liver, no spleen.  No tenderness.   MUSCULOSKELETAL:   EXTREMITIES:   RIGHT LOWER EXTREMITY:  Right below-knee amputation site has some necrotic eschar in several patches.  At some point, these may need to be debrided.   LEFT LOWER EXTREMITY:  His left foot surgical incision still has his sutures in place.  I do not think this is totally well approximated in the mid aspect.  Although there is no evidence of infection, there is still some sanguineous drainage.   CIRCULATION:  ARTERIAL:  He has very poor peripheral pulses.  There is nothing palpable at either popliteal.   PSYCHIATRIC:   MENTAL STATUS:   The patient is aware of the year, not the month.  He has trouble with simple basic questions.  I wonder about a mild degree of underlying dementia.    ASSESSMENT/PLAN:  Diabetic osteomyelitis and septic  arthritis.  Status post surgical amputation at the fourth and fifth metatarsophalangeals.  He is still on doxycycline orally.    Type 2 diabetes with chronic renal failure.  On dialysis.  I will need to check his CBGs.    Atrial fibrillation.  On amiodarone and Coumadin.  His rate is controlled.  I had reduced his amiodarone about a month ago due to anorexia.  However, he appears to have tolerated this well.    Hypothyroidism.   He will need a follow-up TSH in a month's time.    Underlying cognitive impairment. This is not well documented in our record.   I will continue to monitor this man.   He follows up with Orthopaedics later this week.  I will see if he has follow-up orthopedics for his clavicle and humerus fractures.   Currently, he has an arm immobilizer in place.

## 2013-06-04 DIAGNOSIS — M869 Osteomyelitis, unspecified: Principal | ICD-10-CM

## 2013-06-04 DIAGNOSIS — A4901 Methicillin susceptible Staphylococcus aureus infection, unspecified site: Secondary | ICD-10-CM | POA: Insufficient documentation

## 2013-06-04 DIAGNOSIS — E1169 Type 2 diabetes mellitus with other specified complication: Secondary | ICD-10-CM | POA: Insufficient documentation

## 2013-06-09 ENCOUNTER — Non-Acute Institutional Stay (SKILLED_NURSING_FACILITY): Payer: Medicare Other | Admitting: Internal Medicine

## 2013-06-09 DIAGNOSIS — L97509 Non-pressure chronic ulcer of other part of unspecified foot with unspecified severity: Secondary | ICD-10-CM

## 2013-06-12 NOTE — Progress Notes (Signed)
Patient ID: Chris Vance, male   DOB: 08/01/1940, 73 y.o.   MRN: [id number]                   PROGRESS NOTE  DATE:  06/09/2013    FACILITY: [facility]    LEVEL OF CARE:   SNF   Acute Visit   CHIEF COMPLAINT:  Review of wounds.    HISTORY OF PRESENT ILLNESS:  Chris Vance has returned to see his orthopedic surgeon after a fourth and fifth ray amputation on the left foot.   Apparently, some eschar was also debrided from his original right below-knee stump.  He has tolerated all of this well.  The wound care nurse had me look at the left foot.    PHYSICAL EXAMINATION:   SKIN:  INSPECTION:  The surgical site has some eschar, especially superiorly.  The order was for Silvadene cream.  I think, in terms of infection prevention, it is probably a good thing.  However, I suspect this is going to need debridement at some point, although I will continue to follow this for now.    ASSESSMENT/PLAN:  Left foot surgical wound.  I suspect this is going to need more than Silvadene cream at some point, probably debridement, both mechanically and Santyl.    CPT CODE: 55732

## 2013-06-23 ENCOUNTER — Non-Acute Institutional Stay (SKILLED_NURSING_FACILITY): Payer: Medicare Other | Admitting: Internal Medicine

## 2013-06-23 DIAGNOSIS — T8189XA Other complications of procedures, not elsewhere classified, initial encounter: Secondary | ICD-10-CM

## 2013-06-23 DIAGNOSIS — L97509 Non-pressure chronic ulcer of other part of unspecified foot with unspecified severity: Secondary | ICD-10-CM

## 2013-06-23 NOTE — Progress Notes (Signed)
Patient ID: Chris Vance, male   DOB: 30-Dec-1940, 73 y.o.   MRN: [id number]                   PROGRESS NOTE  DATE:  06/23/2013    FACILITY: [facility]    LEVEL OF CARE:   SNF   Acute Visit   CHIEF COMPLAINT:  Review of left foot surgical area  HISTORY OF PRESENT ILLNESS: This was a area I last saw after his return from the hospital. He had undergone a left fourth and fifth ray amputation for gangrene of his left foot. He previously had a right below knee amputation. He is a diabetic on dialysis. I don't see any prior arterial evaluation on either side he also has a history of diabetic neuropathy.  I saw this last time he had returned from the hospital. The area had already started to dehisce somewhat especially superiorly. The orders had been for Silvadene cream to   PHYSICAL EXAMINATION:   SKIN:  INSPECTION: The surgical area is open superiorly and probes deeply. I have obtained a culture. Abdominal light surface debridement of the rest of the wound which is more opposed but still open. There is no overt soft tissue infection Vascular; I don't see arterial Dopplers from the hospital however he has a brisk dorsalis pedis pulse   ASSESSMENT/PLAN:  Left foot surgical wound.  . I changed the dressing to Santyl. I have done a deep culture of this area but will not start him on empiric antibiotics until the culture returns. The area of his foot especially superiorly he is going to be very difficult to get the close. Further imaging studies may be necessary

## 2013-06-25 ENCOUNTER — Other Ambulatory Visit (HOSPITAL_COMMUNITY): Payer: Self-pay | Admitting: Orthopedic Surgery

## 2013-06-30 ENCOUNTER — Encounter (HOSPITAL_COMMUNITY): Payer: Self-pay | Admitting: *Deleted

## 2013-06-30 MED ORDER — CEFAZOLIN SODIUM-DEXTROSE 2-3 GM-% IV SOLR
2.0000 g | INTRAVENOUS | Status: AC
  Administered 2013-07-01: 2 g via INTRAVENOUS
  Filled 2013-06-30: qty 50

## 2013-06-30 NOTE — Progress Notes (Signed)
Spoke with Merita Norton, nurse at [facility] where pt is living. She verified allergies, meds and med hx by sending hx list, med list and physician notes via fax. Gave her pre-op instructions for pt.

## 2013-07-01 ENCOUNTER — Inpatient Hospital Stay (HOSPITAL_COMMUNITY): Payer: Medicare Other | Admitting: Anesthesiology

## 2013-07-01 ENCOUNTER — Encounter (HOSPITAL_COMMUNITY): Payer: Self-pay | Admitting: *Deleted

## 2013-07-01 ENCOUNTER — Inpatient Hospital Stay (HOSPITAL_COMMUNITY): Payer: Medicare Other

## 2013-07-01 ENCOUNTER — Encounter (HOSPITAL_COMMUNITY)
Admission: RE | Disposition: A | Discharge status: Hospice | Payer: Self-pay | Source: Ambulatory Visit | Attending: Orthopedic Surgery

## 2013-07-01 ENCOUNTER — Encounter (HOSPITAL_COMMUNITY): Payer: Self-pay | Admitting: Pharmacy Technician

## 2013-07-01 ENCOUNTER — Inpatient Hospital Stay (HOSPITAL_COMMUNITY)
Admission: RE | Admit: 2013-07-01 | Discharge: 2013-07-08 | DRG: 239 | Disposition: A | Discharge status: Hospice | Payer: Medicare Other | Source: Ambulatory Visit | Attending: Internal Medicine | Admitting: Internal Medicine

## 2013-07-01 ENCOUNTER — Encounter (HOSPITAL_COMMUNITY): Payer: Medicare Other | Admitting: Anesthesiology

## 2013-07-01 ENCOUNTER — Other Ambulatory Visit (HOSPITAL_COMMUNITY): Payer: Self-pay | Admitting: Orthopedic Surgery

## 2013-07-01 DIAGNOSIS — F419 Anxiety disorder, unspecified: Secondary | ICD-10-CM | POA: Diagnosis present

## 2013-07-01 DIAGNOSIS — I4891 Unspecified atrial fibrillation: Secondary | ICD-10-CM | POA: Diagnosis present

## 2013-07-01 DIAGNOSIS — Z7901 Long term (current) use of anticoagulants: Secondary | ICD-10-CM

## 2013-07-01 DIAGNOSIS — Z79899 Other long term (current) drug therapy: Secondary | ICD-10-CM

## 2013-07-01 DIAGNOSIS — Z794 Long term (current) use of insulin: Secondary | ICD-10-CM

## 2013-07-01 DIAGNOSIS — E079 Disorder of thyroid, unspecified: Secondary | ICD-10-CM | POA: Diagnosis present

## 2013-07-01 DIAGNOSIS — R5381 Other malaise: Secondary | ICD-10-CM | POA: Diagnosis present

## 2013-07-01 DIAGNOSIS — Z87891 Personal history of nicotine dependence: Secondary | ICD-10-CM

## 2013-07-01 DIAGNOSIS — Z85828 Personal history of other malignant neoplasm of skin: Secondary | ICD-10-CM

## 2013-07-01 DIAGNOSIS — I798 Other disorders of arteries, arterioles and capillaries in diseases classified elsewhere: Secondary | ICD-10-CM | POA: Diagnosis present

## 2013-07-01 DIAGNOSIS — F411 Generalized anxiety disorder: Secondary | ICD-10-CM | POA: Diagnosis present

## 2013-07-01 DIAGNOSIS — D631 Anemia in chronic kidney disease: Secondary | ICD-10-CM | POA: Diagnosis present

## 2013-07-01 DIAGNOSIS — L8990 Pressure ulcer of unspecified site, unspecified stage: Secondary | ICD-10-CM | POA: Diagnosis present

## 2013-07-01 DIAGNOSIS — E1152 Type 2 diabetes mellitus with diabetic peripheral angiopathy with gangrene: Secondary | ICD-10-CM | POA: Diagnosis present

## 2013-07-01 DIAGNOSIS — G253 Myoclonus: Secondary | ICD-10-CM | POA: Diagnosis present

## 2013-07-01 DIAGNOSIS — L89109 Pressure ulcer of unspecified part of back, unspecified stage: Secondary | ICD-10-CM | POA: Diagnosis present

## 2013-07-01 DIAGNOSIS — T8789 Other complications of amputation stump: Secondary | ICD-10-CM | POA: Diagnosis present

## 2013-07-01 DIAGNOSIS — R627 Adult failure to thrive: Secondary | ICD-10-CM | POA: Diagnosis present

## 2013-07-01 DIAGNOSIS — E46 Unspecified protein-calorie malnutrition: Secondary | ICD-10-CM | POA: Diagnosis present

## 2013-07-01 DIAGNOSIS — F3289 Other specified depressive episodes: Secondary | ICD-10-CM | POA: Diagnosis present

## 2013-07-01 DIAGNOSIS — N189 Chronic kidney disease, unspecified: Secondary | ICD-10-CM

## 2013-07-01 DIAGNOSIS — I509 Heart failure, unspecified: Secondary | ICD-10-CM | POA: Diagnosis present

## 2013-07-01 DIAGNOSIS — I120 Hypertensive chronic kidney disease with stage 5 chronic kidney disease or end stage renal disease: Secondary | ICD-10-CM | POA: Diagnosis present

## 2013-07-01 DIAGNOSIS — I96 Gangrene, not elsewhere classified: Secondary | ICD-10-CM | POA: Diagnosis present

## 2013-07-01 DIAGNOSIS — F329 Major depressive disorder, single episode, unspecified: Secondary | ICD-10-CM | POA: Diagnosis present

## 2013-07-01 DIAGNOSIS — Y835 Amputation of limb(s) as the cause of abnormal reaction of the patient, or of later complication, without mention of misadventure at the time of the procedure: Secondary | ICD-10-CM | POA: Diagnosis present

## 2013-07-01 DIAGNOSIS — E1159 Type 2 diabetes mellitus with other circulatory complications: Principal | ICD-10-CM | POA: Diagnosis present

## 2013-07-01 DIAGNOSIS — Z515 Encounter for palliative care: Secondary | ICD-10-CM

## 2013-07-01 DIAGNOSIS — I739 Peripheral vascular disease, unspecified: Secondary | ICD-10-CM | POA: Diagnosis present

## 2013-07-01 DIAGNOSIS — M899 Disorder of bone, unspecified: Secondary | ICD-10-CM | POA: Diagnosis present

## 2013-07-01 DIAGNOSIS — N186 End stage renal disease: Secondary | ICD-10-CM | POA: Diagnosis present

## 2013-07-01 DIAGNOSIS — N039 Chronic nephritic syndrome with unspecified morphologic changes: Secondary | ICD-10-CM

## 2013-07-01 DIAGNOSIS — D62 Acute posthemorrhagic anemia: Secondary | ICD-10-CM | POA: Diagnosis not present

## 2013-07-01 DIAGNOSIS — I5032 Chronic diastolic (congestive) heart failure: Secondary | ICD-10-CM | POA: Diagnosis present

## 2013-07-01 DIAGNOSIS — E785 Hyperlipidemia, unspecified: Secondary | ICD-10-CM | POA: Diagnosis present

## 2013-07-01 DIAGNOSIS — IMO0002 Reserved for concepts with insufficient information to code with codable children: Secondary | ICD-10-CM

## 2013-07-01 DIAGNOSIS — Z66 Do not resuscitate: Secondary | ICD-10-CM | POA: Diagnosis present

## 2013-07-01 DIAGNOSIS — M949 Disorder of cartilage, unspecified: Secondary | ICD-10-CM

## 2013-07-01 DIAGNOSIS — N2581 Secondary hyperparathyroidism of renal origin: Secondary | ICD-10-CM | POA: Diagnosis present

## 2013-07-01 DIAGNOSIS — G9340 Encephalopathy, unspecified: Secondary | ICD-10-CM | POA: Diagnosis present

## 2013-07-01 HISTORY — PX: I & D EXTREMITY: SHX5045

## 2013-07-01 HISTORY — DX: Depression, unspecified: F32.A

## 2013-07-01 HISTORY — DX: Major depressive disorder, single episode, unspecified: F32.9

## 2013-07-01 HISTORY — DX: Cardiac arrhythmia, unspecified: I49.9

## 2013-07-01 HISTORY — DX: Peripheral vascular disease, unspecified: I73.9

## 2013-07-01 HISTORY — PX: AMPUTATION: SHX166

## 2013-07-01 HISTORY — DX: Unspecified diastolic (congestive) heart failure: I50.30

## 2013-07-01 LAB — POCT I-STAT 4, (NA,K, GLUC, HGB,HCT)
Glucose, Bld: 106 mg/dL — ABNORMAL HIGH (ref 70–99)
HCT: 41 % (ref 39.0–52.0)
Hemoglobin: 13.9 g/dL (ref 13.0–17.0)
POTASSIUM: 4.8 meq/L (ref 3.7–5.3)
Sodium: 137 mEq/L (ref 137–147)

## 2013-07-01 LAB — PROTIME-INR
INR: 1.92 — AB (ref 0.00–1.49)
Prothrombin Time: 21.4 seconds — ABNORMAL HIGH (ref 11.6–15.2)

## 2013-07-01 LAB — GLUCOSE, CAPILLARY
GLUCOSE-CAPILLARY: 95 mg/dL (ref 70–99)
GLUCOSE-CAPILLARY: 106 mg/dL — AB (ref 70–99)
GLUCOSE-CAPILLARY: 123 mg/dL — AB (ref 70–99)
GLUCOSE-CAPILLARY: 97 mg/dL (ref 70–99)
Glucose-Capillary: 102 mg/dL — ABNORMAL HIGH (ref 70–99)
Glucose-Capillary: 96 mg/dL (ref 70–99)

## 2013-07-01 LAB — APTT: aPTT: 48 seconds — ABNORMAL HIGH (ref 24–37)

## 2013-07-01 SURGERY — AMPUTATION BELOW KNEE
Anesthesia: General | Site: Leg Lower | Laterality: Right

## 2013-07-01 MED ORDER — OXYCODONE HCL 5 MG PO TABS
5.0000 mg | ORAL_TABLET | Freq: Once | ORAL | Status: AC | PRN
  Administered 2013-07-01: 5 mg via ORAL

## 2013-07-01 MED ORDER — AMIODARONE HCL 100 MG PO TABS
100.0000 mg | ORAL_TABLET | Freq: Two times a day (BID) | ORAL | Status: DC
  Administered 2013-07-01 – 2013-07-08 (×10): 100 mg via ORAL
  Filled 2013-07-01 (×15): qty 1

## 2013-07-01 MED ORDER — NALOXONE HCL 0.4 MG/ML IJ SOLN
0.4000 mg | INTRAMUSCULAR | Status: DC | PRN
  Administered 2013-07-01: 0.4 mg via INTRAVENOUS

## 2013-07-01 MED ORDER — INSULIN ASPART 100 UNIT/ML ~~LOC~~ SOLN
0.0000 [IU] | Freq: Three times a day (TID) | SUBCUTANEOUS | Status: DC
  Administered 2013-07-01: 2 [IU] via SUBCUTANEOUS

## 2013-07-01 MED ORDER — SODIUM CHLORIDE 0.9 % IV SOLN
INTRAVENOUS | Status: DC
  Administered 2013-07-01: 09:00:00 via INTRAVENOUS

## 2013-07-01 MED ORDER — ONDANSETRON HCL 4 MG/2ML IJ SOLN: 4.0000 mg | Freq: Four times a day (QID) | INTRAMUSCULAR | Status: DC | PRN

## 2013-07-01 MED ORDER — METOCLOPRAMIDE HCL 10 MG PO TABS: 5.0000 mg | ORAL_TABLET | Freq: Three times a day (TID) | ORAL | Status: DC | PRN

## 2013-07-01 MED ORDER — MIRTAZAPINE 15 MG PO TABS
15.0000 mg | ORAL_TABLET | Freq: Every day | ORAL | Status: DC
  Administered 2013-07-01 – 2013-07-03 (×3): 15 mg via ORAL
  Filled 2013-07-01 (×4): qty 1

## 2013-07-01 MED ORDER — LACTATED RINGERS IV SOLN
INTRAVENOUS | Status: DC | PRN
  Administered 2013-07-01: 10:00:00 via INTRAVENOUS

## 2013-07-01 MED ORDER — MIDAZOLAM HCL 2 MG/2ML IJ SOLN
INTRAMUSCULAR | Status: AC
  Filled 2013-07-01: qty 2

## 2013-07-01 MED ORDER — CALCIUM ACETATE 667 MG PO CAPS
2001.0000 mg | ORAL_CAPSULE | Freq: Three times a day (TID) | ORAL | Status: DC
  Administered 2013-07-01 – 2013-07-06 (×8): 2001 mg via ORAL
  Filled 2013-07-01 (×20): qty 3

## 2013-07-01 MED ORDER — SODIUM CHLORIDE 0.9 % IR SOLN
Status: DC | PRN
  Administered 2013-07-01: 1000 mL

## 2013-07-01 MED ORDER — LIDOCAINE HCL (CARDIAC) 20 MG/ML IV SOLN
INTRAVENOUS | Status: DC | PRN
  Administered 2013-07-01: 70 mg via INTRAVENOUS

## 2013-07-01 MED ORDER — RENA-VITE PO TABS
1.0000 | ORAL_TABLET | Freq: Every day | ORAL | Status: DC
  Administered 2013-07-01 – 2013-07-03 (×3): 1 via ORAL
  Filled 2013-07-01 (×4): qty 1

## 2013-07-01 MED ORDER — OXYCODONE HCL 5 MG/5ML PO SOLN: 5.0000 mg | Freq: Once | ORAL | Status: AC | PRN

## 2013-07-01 MED ORDER — HYDROMORPHONE HCL PF 1 MG/ML IJ SOLN: 0.5000 mg | INTRAMUSCULAR | Status: DC | PRN

## 2013-07-01 MED ORDER — OXYCODONE HCL 5 MG PO TABS
ORAL_TABLET | ORAL | Status: AC
  Filled 2013-07-01: qty 1

## 2013-07-01 MED ORDER — WARFARIN SODIUM 4 MG PO TABS
4.0000 mg | ORAL_TABLET | Freq: Once | ORAL | Status: AC
  Administered 2013-07-01: 4 mg via ORAL
  Filled 2013-07-01: qty 1

## 2013-07-01 MED ORDER — LIDOCAINE HCL (CARDIAC) 20 MG/ML IV SOLN
INTRAVENOUS | Status: AC
  Filled 2013-07-01: qty 5

## 2013-07-01 MED ORDER — PROPOFOL 10 MG/ML IV BOLUS
INTRAVENOUS | Status: AC
  Filled 2013-07-01: qty 20

## 2013-07-01 MED ORDER — NALOXONE HCL 0.4 MG/ML IJ SOLN
INTRAMUSCULAR | Status: AC
  Filled 2013-07-01: qty 1

## 2013-07-01 MED ORDER — GLYCOPYRROLATE 0.2 MG/ML IJ SOLN
INTRAMUSCULAR | Status: DC | PRN
  Administered 2013-07-01: 0.2 mg via INTRAVENOUS

## 2013-07-01 MED ORDER — ESCITALOPRAM OXALATE 10 MG PO TABS
10.0000 mg | ORAL_TABLET | Freq: Every day | ORAL | Status: DC
  Administered 2013-07-02 – 2013-07-03 (×2): 10 mg via ORAL
  Filled 2013-07-01 (×3): qty 1

## 2013-07-01 MED ORDER — PROMETHAZINE HCL 25 MG/ML IJ SOLN: 6.2500 mg | INTRAMUSCULAR | Status: DC | PRN

## 2013-07-01 MED ORDER — LABETALOL HCL 100 MG PO TABS
100.0000 mg | ORAL_TABLET | Freq: Two times a day (BID) | ORAL | Status: DC
  Filled 2013-07-01 (×2): qty 1

## 2013-07-01 MED ORDER — WARFARIN - PHYSICIAN DOSING INPATIENT: Freq: Every day | Status: DC

## 2013-07-01 MED ORDER — OXYCODONE-ACETAMINOPHEN 5-325 MG PO TABS: 1.0000 | ORAL_TABLET | ORAL | Status: DC | PRN

## 2013-07-01 MED ORDER — ONDANSETRON HCL 4 MG/2ML IJ SOLN
INTRAMUSCULAR | Status: DC | PRN
  Administered 2013-07-01: 4 mg via INTRAVENOUS

## 2013-07-01 MED ORDER — ONDANSETRON HCL 4 MG PO TABS: 4.0000 mg | ORAL_TABLET | Freq: Four times a day (QID) | ORAL | Status: DC | PRN

## 2013-07-01 MED ORDER — HYDROMORPHONE HCL PF 1 MG/ML IJ SOLN
0.2500 mg | INTRAMUSCULAR | Status: DC | PRN
  Administered 2013-07-01 (×2): 0.25 mg via INTRAVENOUS

## 2013-07-01 MED ORDER — INSULIN ASPART 100 UNIT/ML ~~LOC~~ SOLN
3.0000 [IU] | Freq: Three times a day (TID) | SUBCUTANEOUS | Status: DC
  Administered 2013-07-03: 3 [IU] via SUBCUTANEOUS

## 2013-07-01 MED ORDER — FENTANYL CITRATE 0.05 MG/ML IJ SOLN
INTRAMUSCULAR | Status: AC
  Filled 2013-07-01: qty 5

## 2013-07-01 MED ORDER — FENTANYL CITRATE 0.05 MG/ML IJ SOLN
INTRAMUSCULAR | Status: DC | PRN
  Administered 2013-07-01 (×2): 25 ug via INTRAVENOUS

## 2013-07-01 MED ORDER — METOPROLOL TARTRATE 100 MG PO TABS
100.0000 mg | ORAL_TABLET | Freq: Two times a day (BID) | ORAL | Status: DC
  Filled 2013-07-01 (×3): qty 1

## 2013-07-01 MED ORDER — LEVOTHYROXINE SODIUM 50 MCG PO TABS
50.0000 ug | ORAL_TABLET | Freq: Every day | ORAL | Status: DC
  Administered 2013-07-02 – 2013-07-03 (×2): 50 ug via ORAL
  Filled 2013-07-01 (×4): qty 1

## 2013-07-01 MED ORDER — WARFARIN - PHARMACIST DOSING INPATIENT
Freq: Every day | Status: DC
  Administered 2013-07-02: 17:00:00

## 2013-07-01 MED ORDER — GABAPENTIN 300 MG PO CAPS
300.0000 mg | ORAL_CAPSULE | Freq: Every day | ORAL | Status: DC
  Administered 2013-07-01 – 2013-07-05 (×5): 300 mg via ORAL
  Filled 2013-07-01 (×7): qty 1

## 2013-07-01 MED ORDER — EPHEDRINE SULFATE 50 MG/ML IJ SOLN
INTRAMUSCULAR | Status: DC | PRN
  Administered 2013-07-01: 15 mg via INTRAVENOUS
  Administered 2013-07-01: 10 mg via INTRAVENOUS

## 2013-07-01 MED ORDER — WARFARIN SODIUM 3 MG PO TABS
3.5000 mg | ORAL_TABLET | Freq: Every day | ORAL | Status: DC
  Administered 2013-07-02 – 2013-07-03 (×2): 3.5 mg via ORAL
  Filled 2013-07-01 (×3): qty 1

## 2013-07-01 MED ORDER — NEPRO/CARBSTEADY PO LIQD
237.0000 mL | Freq: Three times a day (TID) | ORAL | Status: DC
  Administered 2013-07-02 – 2013-07-06 (×5): 237 mL via ORAL
  Filled 2013-07-01 (×22): qty 237

## 2013-07-01 MED ORDER — ONDANSETRON HCL 4 MG/2ML IJ SOLN
INTRAMUSCULAR | Status: AC
  Filled 2013-07-01: qty 2

## 2013-07-01 MED ORDER — ACETAMINOPHEN 325 MG PO TABS
650.0000 mg | ORAL_TABLET | Freq: Four times a day (QID) | ORAL | Status: DC | PRN
  Administered 2013-07-02: 650 mg via ORAL
  Filled 2013-07-01 (×2): qty 2

## 2013-07-01 MED ORDER — PHENYLEPHRINE 40 MCG/ML (10ML) SYRINGE FOR IV PUSH (FOR BLOOD PRESSURE SUPPORT)
PREFILLED_SYRINGE | INTRAVENOUS | Status: AC
  Filled 2013-07-01: qty 10

## 2013-07-01 MED ORDER — HYDROMORPHONE HCL PF 1 MG/ML IJ SOLN
INTRAMUSCULAR | Status: AC
  Filled 2013-07-01: qty 1

## 2013-07-01 MED ORDER — NALOXONE HCL 0.4 MG/ML IJ SOLN
INTRAMUSCULAR | Status: AC
Start: 2013-07-01 — End: 2013-07-02
  Filled 2013-07-01: qty 1

## 2013-07-01 MED ORDER — WARFARIN SODIUM 3 MG PO TABS
3.5000 mg | ORAL_TABLET | Freq: Every day | ORAL | Status: DC
  Filled 2013-07-01: qty 1

## 2013-07-01 MED ORDER — PROPOFOL 10 MG/ML IV BOLUS
INTRAVENOUS | Status: DC | PRN
  Administered 2013-07-01: 110 mg via INTRAVENOUS

## 2013-07-01 MED ORDER — LEVOTHYROXINE SODIUM 25 MCG PO TABS
25.0000 ug | ORAL_TABLET | Freq: Every day | ORAL | Status: DC
  Filled 2013-07-01: qty 1

## 2013-07-01 MED ORDER — METOCLOPRAMIDE HCL 5 MG/ML IJ SOLN: 5.0000 mg | Freq: Three times a day (TID) | INTRAMUSCULAR | Status: DC | PRN

## 2013-07-01 SURGICAL SUPPLY — 72 items
BANDAGE ESMARK 6X9 LF (GAUZE/BANDAGES/DRESSINGS) ×2 IMPLANT
BANDAGE GAUZE ELAST BULKY 4 IN (GAUZE/BANDAGES/DRESSINGS) ×8 IMPLANT
BLADE SAW RECIP 87.9 MT (BLADE) ×4 IMPLANT
BLADE SURG 10 STRL SS (BLADE) IMPLANT
BLADE SURG 21 STRL SS (BLADE) ×4 IMPLANT
BNDG CMPR 9X6 STRL LF SNTH (GAUZE/BANDAGES/DRESSINGS) ×2
BNDG COHESIVE 4X5 TAN STRL (GAUZE/BANDAGES/DRESSINGS) IMPLANT
BNDG COHESIVE 6X5 TAN STRL LF (GAUZE/BANDAGES/DRESSINGS) ×8 IMPLANT
BNDG ESMARK 6X9 LF (GAUZE/BANDAGES/DRESSINGS) ×4
BNDG GAUZE ELAST 4 BULKY (GAUZE/BANDAGES/DRESSINGS) ×4 IMPLANT
COVER SURGICAL LIGHT HANDLE (MISCELLANEOUS) ×4 IMPLANT
CUFF TOURNIQUET SINGLE 18IN (TOURNIQUET CUFF) IMPLANT
CUFF TOURNIQUET SINGLE 24IN (TOURNIQUET CUFF) IMPLANT
CUFF TOURNIQUET SINGLE 34IN LL (TOURNIQUET CUFF) ×2 IMPLANT
CUFF TOURNIQUET SINGLE 44IN (TOURNIQUET CUFF) IMPLANT
DRAIN PENROSE 1/2X12 LTX STRL (WOUND CARE) IMPLANT
DRAPE EXTREMITY BILATERAL (DRAPE) ×2 IMPLANT
DRAPE EXTREMITY T 121X128X90 (DRAPE) ×2 IMPLANT
DRAPE PROXIMA HALF (DRAPES) ×6 IMPLANT
DRAPE U-SHAPE 47X51 STRL (DRAPES) ×8 IMPLANT
DRSG ADAPTIC 3X8 NADH LF (GAUZE/BANDAGES/DRESSINGS) ×8 IMPLANT
DRSG PAD ABDOMINAL 8X10 ST (GAUZE/BANDAGES/DRESSINGS) ×10 IMPLANT
DURAPREP 26ML APPLICATOR (WOUND CARE) ×6 IMPLANT
ELECT CAUTERY BLADE 6.4 (BLADE) ×2 IMPLANT
ELECT REM PT RETURN 9FT ADLT (ELECTROSURGICAL) ×4
ELECTRODE REM PT RTRN 9FT ADLT (ELECTROSURGICAL) ×2 IMPLANT
GLOVE BIO SURGEON STRL SZ 6.5 (GLOVE) ×1 IMPLANT
GLOVE BIO SURGEONS STRL SZ 6.5 (GLOVE) ×1
GLOVE BIOGEL PI IND STRL 6.5 (GLOVE) IMPLANT
GLOVE BIOGEL PI IND STRL 7.0 (GLOVE) IMPLANT
GLOVE BIOGEL PI IND STRL 7.5 (GLOVE) IMPLANT
GLOVE BIOGEL PI IND STRL 9 (GLOVE) ×2 IMPLANT
GLOVE BIOGEL PI INDICATOR 6.5 (GLOVE) ×2
GLOVE BIOGEL PI INDICATOR 7.0 (GLOVE) ×2
GLOVE BIOGEL PI INDICATOR 7.5 (GLOVE) ×2
GLOVE BIOGEL PI INDICATOR 9 (GLOVE) ×2
GLOVE ECLIPSE 6.5 STRL STRAW (GLOVE) ×2 IMPLANT
GLOVE ECLIPSE 7.0 STRL STRAW (GLOVE) ×2 IMPLANT
GLOVE SURG ORTHO 9.0 STRL STRW (GLOVE) ×4 IMPLANT
GOWN STRL REUS W/ TWL LRG LVL3 (GOWN DISPOSABLE) IMPLANT
GOWN STRL REUS W/ TWL XL LVL3 (GOWN DISPOSABLE) ×4 IMPLANT
GOWN STRL REUS W/TWL LRG LVL3 (GOWN DISPOSABLE) ×8
GOWN STRL REUS W/TWL XL LVL3 (GOWN DISPOSABLE) ×8
HANDPIECE INTERPULSE COAX TIP (DISPOSABLE)
KIT BASIN OR (CUSTOM PROCEDURE TRAY) ×4 IMPLANT
KIT ROOM TURNOVER OR (KITS) ×4 IMPLANT
MANIFOLD NEPTUNE II (INSTRUMENTS) ×4 IMPLANT
NS IRRIG 1000ML POUR BTL (IV SOLUTION) ×4 IMPLANT
PACK GENERAL/GYN (CUSTOM PROCEDURE TRAY) ×2 IMPLANT
PACK ORTHO EXTREMITY (CUSTOM PROCEDURE TRAY) ×4 IMPLANT
PAD ABD 8X10 STRL (GAUZE/BANDAGES/DRESSINGS) ×6 IMPLANT
PAD ARMBOARD 7.5X6 YLW CONV (MISCELLANEOUS) ×8 IMPLANT
PADDING CAST COTTON 6X4 STRL (CAST SUPPLIES) ×6 IMPLANT
SET HNDPC FAN SPRY TIP SCT (DISPOSABLE) IMPLANT
SPONGE GAUZE 4X4 12PLY (GAUZE/BANDAGES/DRESSINGS) ×6 IMPLANT
SPONGE GAUZE 4X4 12PLY STER LF (GAUZE/BANDAGES/DRESSINGS) ×4 IMPLANT
SPONGE LAP 18X18 X RAY DECT (DISPOSABLE) ×6 IMPLANT
STAPLER VISISTAT 35W (STAPLE) ×4 IMPLANT
STOCKINETTE IMPERVIOUS 9X36 MD (GAUZE/BANDAGES/DRESSINGS) IMPLANT
STOCKINETTE IMPERVIOUS LG (DRAPES) ×4 IMPLANT
SUT ETHILON 2 0 PSLX (SUTURE) ×4 IMPLANT
SUT PDS AB 1 CT  36 (SUTURE) ×4
SUT PDS AB 1 CT 36 (SUTURE) IMPLANT
SUT SILK 2 0 (SUTURE) ×4
SUT SILK 2-0 18XBRD TIE 12 (SUTURE) ×2 IMPLANT
TOWEL OR 17X24 6PK STRL BLUE (TOWEL DISPOSABLE) ×4 IMPLANT
TOWEL OR 17X26 10 PK STRL BLUE (TOWEL DISPOSABLE) ×4 IMPLANT
TUBE ANAEROBIC SPECIMEN COL (MISCELLANEOUS) IMPLANT
TUBE CONNECTING 12'X1/4 (SUCTIONS) ×1
TUBE CONNECTING 12X1/4 (SUCTIONS) ×3 IMPLANT
UNDERPAD 30X30 INCONTINENT (UNDERPADS AND DIAPERS) ×4 IMPLANT
YANKAUER SUCT BULB TIP NO VENT (SUCTIONS) ×4 IMPLANT

## 2013-07-01 NOTE — Progress Notes (Signed)
Utilization review completed.  

## 2013-07-01 NOTE — Progress Notes (Signed)
ANTICOAGULATION CONSULT NOTE - Initial Consult  Pharmacy Consult for Coumadin Indication: atrial fibrillation and VTE prophylaxis  Allergies  Allergen Reactions  . Ace Inhibitors Other (See Comments)    Unknown   . Lisinopril Cough    Patient Measurements: Height: 5\' 10"  (177.8 cm) Weight: 141 lb (63.957 kg) IBW/kg (Calculated) : 73  Vital Signs: Temp: 97.4 F (36.3 C) (05/06 1325) Temp src: Oral (05/06 1325) BP: 134/57 mmHg (05/06 1325) Pulse Rate: 52 (05/06 1405)  Labs:  Recent Labs  07/01/13 0805 07/01/13 0810  HGB  --  13.9  HCT  --  41.0  APTT 48*  --   LABPROT 21.4*  --   INR 1.92*  --     Estimated Creatinine Clearance: 11.2 ml/min (by C-G formula based on Cr of 5.4).   Medical History: Past Medical History  Diagnosis Date  . Diabetes mellitus   . Hypertension   . Anemia   . Hyperlipidemia   . Thyroid disease   . Anxiety   . Skin cancer   . Dysrhythmia     Atrial fibrillation   . Peripheral vascular disease   . Diastolic CHF     per physician notes 06/30/13  . Depression   . Renal disorder     dialysis   Assessment:   S/p left BKA. To continue Coumadin for hx atrial fibrillation and VTE prophylaxis post-op.      Spoke with Therapist, sports at [facility].   Previously took Coumadin 4 mg on MWF and 3.5 mg on TTSS; dose decreased to 3.5 mg daily when INR was 2.83.  INR 2.63 on 5/5; Coumadin held last night for OR today. INR down to 1.92 today.    RN at [facility] also reported that Synthroid dose was increased from 25 to 50 mcg daily on 06/30/13.  Both Metoprolol and Labetalol on inpatient med list, but has NOT been on Labetalol at SNF.  Goal of Therapy:  INR 2-3 Monitor platelets by anticoagulation protocol: Yes   Plan:   Coumadin 4 mg x 1 tonight, then 3.5 mg daily as prior to admission.   Daily PT/INR for now.    Synthroid dose modified per change in dose on 06/30/13.   Labetalol discontinued, since not taking prior to admission.   Electronic home med list  updated.  Arty Baumgartner, [place] Pager: [phone] 07/01/2013,4:32 PM

## 2013-07-01 NOTE — H&P (Signed)
Chris Vance is an 73 y.o. male.   Chief Complaint: Gangrene left lower extremity with dehiscence right transtibial amputation HPI: Patient is a 73 year old gentleman with peripheral vascular disease diabetes hypertension who has undergone previous right lower extremity transtibial amputation as well as foot salvage surgery on the left. Patient presents at this time with progressive gangrenous changes to the right transtibial amputation and progressive gangrenous changes to the left foot  Past Medical History  Diagnosis Date  . Renal disorder   . Diabetes mellitus   . Hypertension   . Anemia   . Hyperlipidemia   . Thyroid disease   . Anxiety   . Skin cancer   . Dysrhythmia     Atrial fibrillation   . Peripheral vascular disease   . Diastolic CHF     per physician notes 06/30/13    Past Surgical History  Procedure Laterality Date  . Av fistula placement    . Amputation Right 02/13/2013    Procedure: Right Below Knee Amputation;  Surgeon: Newt Minion, MD;  Location: [place];  Service: Orthopedics;  Laterality: Right;  Right Below Knee Amputation  . Amputation Left 05/15/2013    Procedure: AMPUTATION metatarsal;  Surgeon: Newt Minion, MD;  Location: [place];  Service: Orthopedics;  Laterality: Left;    Family History  Problem Relation Age of Onset  . Asthma Father    Social History:  reports that he has quit smoking. His smoking use included Cigarettes. He started smoking about 56 years ago. He has a 55 pack-year smoking history. He has never used smokeless tobacco. He reports that he does not drink alcohol or use illicit drugs.  Allergies:  Allergies  Allergen Reactions  . Ace Inhibitors Other (See Comments)    Unknown   . Lisinopril Cough    No prescriptions prior to admission    No results found for this or any previous visit (from the past 48 hour(s)). No results found.  Review of Systems  All other systems reviewed and are negative.   There were no vitals  taken for this visit. Physical Exam  Gangrene left foot with ischemic changes right transtibial amputation Assessment/Plan Assessment: Gangrene right foot with ischemic changes to the left transtibial amputation.  Plan: Will plan for revision of the right transtibial amputation and left transtibial amputation. Risks and benefits were discussed including risk of the wound not healing. Patient states he understands was to proceed at this time.  Newt Minion 07/01/2013, 6:29 AM

## 2013-07-01 NOTE — Progress Notes (Addendum)
  Pt. Confused and cannot say why he is here today.Telephone consent received from son.  Pt. Is scheduled to have dialysis today.  Pt. Is resident at [facility] .  For transportation back to Nursing home  . Kendrick Fries @ [phone]

## 2013-07-01 NOTE — Anesthesia Postprocedure Evaluation (Signed)
  Anesthesia Post-op Note  Patient: Chris Vance  Procedure(s) Performed: Procedure(s) with comments: AMPUTATION BELOW KNEE (Left) - Left Below Knee Amputation IRRIGATION AND DEBRIDEMENT EXTREMITY (Right) - Irrigation and Debridement Right Below Knee Amputation  Patient Location: PACU  Anesthesia Type:General  Level of Consciousness: sedated and confused  Airway and Oxygen Therapy: Patient Spontanous Breathing  Post-op Pain: mild  Post-op Assessment: Post-op Vital signs reviewed  Post-op Vital Signs: stable  Last Vitals:  Filed Vitals:   07/01/13 1145  BP: 111/56  Pulse: 52  Temp: 36.5 C  Resp: 15    Complications: No apparent anesthesia complications

## 2013-07-01 NOTE — Consult Note (Signed)
Indication for Consultation:  Management of ESRD/hemodialysis; anemia, hypertension/volume and secondary hyperparathyroidism  HPI: Chris Vance is a 73 y.o. male admitted from [place] for surgery; L BKA and revision of R BKA for gangrene, d/t failure of conservative therapy. 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 He was last admitted 3/17-28 for left 5th toe amputation. He was seen in the ED 4/1 for fracture of distal R clavicle & R proximal humerus after sustaining a fall out of his wheelchair. Will schedule for HD tomorrow  Past Medical History  Diagnosis Date  . Diabetes mellitus   . Hypertension   . Anemia   . Hyperlipidemia   . Thyroid disease   . Anxiety   . Skin cancer   . Dysrhythmia     Atrial fibrillation   . Peripheral vascular disease   . Diastolic CHF     per physician notes 06/30/13  . Depression   . Renal disorder     dialysis   Past Surgical History  Procedure Laterality Date  . Av fistula placement    . Amputation Right 02/13/2013    Procedure: Right Below Knee Amputation;  Surgeon: Newt Minion, MD;  Location: [place];  Service: Orthopedics;  Laterality: Right;  Right Below Knee Amputation  . Amputation Left 05/15/2013    Procedure: AMPUTATION metatarsal;  Surgeon: Newt Minion, MD;  Location: [place];  Service: Orthopedics;  Laterality: Left;   Family History  Problem Relation Age of Onset  . Asthma Father    Social History:  reports that he has quit smoking. His smoking use included Cigarettes. He started smoking about 56 years ago. He has a 55 pack-year smoking history. He has never used smokeless tobacco. He reports that he does not drink alcohol or use illicit drugs. Allergies  Allergen Reactions  . Ace Inhibitors Other (See Comments)    Unknown   . Lisinopril Cough   Prior to Admission medications   Medication Sig Start Date End Date Taking? Authorizing Provider  acetaminophen (TYLENOL) 325 MG tablet Take  650 mg by mouth every 6 (six) hours as needed for mild pain or moderate pain (or Fever >/= 101). 02/16/13  Yes Marrion Coy, MD  Amino Acids-Protein Hydrolys (FEEDING SUPPLEMENT, PRO-STAT SUGAR FREE 64,) LIQD Take 30 mLs by mouth 2 (two) times daily.   Yes Historical Provider, MD  amiodarone (PACERONE) 100 MG tablet Take 1 tablet (100 mg total) by mouth 2 (two) times daily. 05/12/13  Yes Ricard Dillon, MD  calcium acetate (PHOSLO) 667 MG capsule Take 2,001 mg by mouth 3 (three) times daily with meals.   Yes Historical Provider, MD  collagenase (SANTYL) ointment Apply 1 application topically daily.   Yes Historical Provider, MD  doxycycline (VIBRAMYCIN) 100 MG capsule Take 100 mg by mouth 2 (two) times daily.  06/25/13  Yes Historical Provider, MD  escitalopram (LEXAPRO) 10 MG tablet Take 10 mg by mouth daily.   Yes Historical Provider, MD  gabapentin (NEURONTIN) 300 MG capsule Take 300 mg by mouth at bedtime.   Yes Historical Provider, MD  insulin lispro (HUMALOG) 100 UNIT/ML injection Inject 2-10 Units into the skin 4 (four) times daily -  before meals and at bedtime.   Yes Historical Provider, MD  [facility] 1 G injection Inject 1 g into the muscle daily.  06/25/13  Yes Historical Provider, MD  levothyroxine (SYNTHROID, LEVOTHROID) 25 MCG tablet Take 25 mcg by mouth daily before breakfast.  Yes Historical Provider, MD  lidocaine-prilocaine (EMLA) cream Apply 1 application topically 3 (three) times a week. Monday, Wednesday, Friday.  Dialysis med   Yes Historical Provider, MD  loperamide (IMODIUM A-D) 2 MG tablet Take 1 tablet (2 mg total) by mouth 4 (four) times daily as needed for diarrhea or loose stools. 05/22/13  Yes Oswald Hillock, MD  metoprolol (LOPRESSOR) 100 MG tablet Take 100 mg by mouth 2 (two) times daily. 02/16/13  Yes Marrion Coy, MD  mirtazapine (REMERON) 15 MG tablet Take 15 mg by mouth at bedtime.  06/23/13  Yes Historical Provider, MD  oxyCODONE-acetaminophen  (PERCOCET/ROXICET) 5-325 MG per tablet Take 1-2 tablets by mouth every 4 (four) hours as needed for moderate pain. 1-2 by mouth every 4 hours as needed for moderate pain DO NOT EXCEED 4GM OF TYLENOL IN 24 HOURS 05/22/13  Yes Arlo C Lassen, PA-C  silver sulfADIAZINE (SILVADENE) 1 % cream Apply 1 application topically daily.  06/20/13  Yes Historical Provider, MD  warfarin (COUMADIN) 1 MG tablet Take 3.5 mg by mouth daily.    Yes Historical Provider, MD  labetalol (NORMODYNE) 200 MG tablet Take 100 mg by mouth 2 (two) times daily.    Historical Provider, MD   Current Facility-Administered Medications  Medication Dose Route Frequency Provider Last Rate Last Dose  . 0.9 %  sodium chloride infusion   Intravenous Continuous Fulton Reek, MD 10 mL/hr at 07/01/13 [phone]    . HYDROmorphone (DILAUDID) 1 MG/ML injection           . oxyCODONE (Oxy IR/ROXICODONE) 5 MG immediate release tablet            Labs: Basic Metabolic Panel:  Recent Labs Lab 07/01/13 0810  NA 137  K 4.8  GLUCOSE 106*   Liver Function Tests: No results found for this basename: AST, ALT, ALKPHOS, BILITOT, PROT, ALBUMIN,  in the last 168 hours No results found for this basename: LIPASE, AMYLASE,  in the last 168 hours No results found for this basename: AMMONIA,  in the last 168 hours CBC:  Recent Labs Lab 07/01/13 0810  HGB 13.9  HCT 41.0   Cardiac Enzymes: No results found for this basename: CKTOTAL, CKMB, CKMBINDEX, TROPONINI,  in the last 168 hours CBG:  Recent Labs Lab 07/01/13 0843 07/01/13 1035 07/01/13 1144  GLUCAP 95 102* 96   Iron Studies: No results found for this basename: IRON, TIBC, TRANSFERRIN, FERRITIN,  in the last 72 hours Studies/Results: Dg Chest 2 View  07/01/2013   CLINICAL DATA:  Preoperative chest x-ray for left lower extremity gangrene and below-knee amputation.  EXAM: CHEST - 2 VIEW  COMPARISON:  05/27/2013  FINDINGS: Stable moderate cardiomegaly. Chronic pulmonary venous hypertensive  changes are present without overt airspace edema or pleural fluid. No infiltrates are seen.  IMPRESSION: Stable cardiomegaly and chronic pulmonary venous hypertension without overt edema.   Electronically Signed   By: Aletta Edouard M.D.   On: 07/01/2013 08:28   Review of Systems: Patient confused, unable to obtain, not answering questions. Only complaint is pain. Reports had been feeling well prior to admit  Physical Exam: Filed Vitals:   07/01/13 1230 07/01/13 1245 07/01/13 1300 07/01/13 1309  BP: 134/61  147/72 134/58  Pulse: 53 52 54 53  Temp:    97.8 F (36.6 C)  TempSrc:      Resp: 12 13 16 18   Weight:      SpO2: 96% 98% 98% 98%     General: Well developed,  well nourished, in no acute distress. Head: Normocephalic, atraumatic, sclera non-icteric, mucus membranes are moist Neck: Supple. JVD not elevated. No bruits Lungs: Diminished, anterior exam. Breathing is unlabored. Heart: Irregular. No murmurs, rubs, or gallops appreciated. Abdomen: Soft, non-tender, non-distended with normoactive bowel sounds. No rebound/guarding. No obvious abdominal masses. M-S:  Strength and tone appear normal for age. Lower extremities: Bilat BKA with post op dressing dry and intact No bruits Neuro: Alert but lethargic, confused. Moves all extremities spontaneously. Psych:  Responds to some questions appropriately, mostly confused.  Dialysis Access: R AVF +bruit/thrill pulsatile mass  Dialysis Orders:  [place] on MWF 61kgs  3hr 45  180  450/1.5  2K/2Ca  7800 Heparin R AVF No Meds  Profile 4  Assessment/Plan: 1.  Gangrene- L BKA and revision of R BKA- 5/6 with Dr  Sharol Given. Failed conservative therapy 2.  ESRD -  MWF @ [place], K+4.8. HD pending tomorrow, will need new edw 3.  Hypertension/volume  - 134/58, cont metop 4.  Anemia  - hgb 13.9, last tsat 28, ferritin 1500. No ESA or Fe. Watch CBC 5.  Metabolic bone disease -  Corr Ca+10.2  PTH 94. Phos 5.3, cont phoslo, no vit D.  6.  Nutrition - alb 3.0  renal diet. Multivit. nepro  7. DM- per primary 8. Afib- amiodarone, coumadin per pharmacy. INR 1.92 9. AVF- pulsatile mass, per RN @ outpt HD slowly getting bigger, has not been seen by [facility]- will get eval here 10. Dispo- back to [place], NP [place] [phone] 07/01/2013, 1:27 PM   Pt seen, examined, agree w assess/plan as above with additions as indicated.  Kelly Splinter MD pager [phone]    cell [phone] 07/01/2013, 3:17 PM

## 2013-07-01 NOTE — Anesthesia Preprocedure Evaluation (Addendum)
Anesthesia Evaluation  Patient identified by MRN, date of birth, ID band Patient awake and Patient confused    Reviewed: Allergy & Precautions, H&P , NPO status , Patient's Chart, lab work & pertinent test results, reviewed documented beta blocker date and time , Unable to perform ROS - Chart review only  History of Anesthesia Complications Negative for: history of anesthetic complications  Airway Mallampati: II TM Distance: >3 FB Neck ROM: Full    Dental  (+) Edentulous Upper,    Pulmonary shortness of breath and with exertion, former smoker,  breath sounds clear to auscultation  + decreased breath sounds      Cardiovascular hypertension, Pt. on medications and Pt. on home beta blockers + Peripheral Vascular Disease and +CHF + dysrhythmias Rhythm:Regular Rate:Normal     Neuro/Psych PSYCHIATRIC DISORDERS    GI/Hepatic negative GI ROS, Neg liver ROS,   Endo/Other  diabetes, Type 2, Insulin DependentHypothyroidism   Renal/GU Renal diseaseAVF right upper extremity with palpable thrill     Musculoskeletal   Abdominal   Peds  Hematology  (+) anemia ,   Anesthesia Other Findings   Reproductive/Obstetrics                         Anesthesia Physical Anesthesia Plan  ASA: IV  Anesthesia Plan: General   Post-op Pain Management:    Induction: Intravenous  Airway Management Planned: LMA  Additional Equipment:   Intra-op Plan:   Post-operative Plan: Extubation in OR  Informed Consent:   Dental advisory given  Plan Discussed with: CRNA and Surgeon  Anesthesia Plan Comments: (Poor historian, deconditioned, unable to provide much hsitory. On anticoagulants)      Anesthesia Quick Evaluation

## 2013-07-01 NOTE — Op Note (Signed)
OPERATIVE REPORT  DATE OF SURGERY: 07/01/2013  PATIENT:  Chris Vance,  73 y.o. male  PRE-OPERATIVE DIAGNOSIS:  Gangrene Left Foot, Right Below Knee Amputation gangrene  POST-OPERATIVE DIAGNOSIS:  Gangrene Left Foot, Right Below Knee Amputation gangrene  PROCEDURE:  Procedure(s): AMPUTATION BELOW KNEE left Revision amputation below the knee right  SURGEON:  Surgeon(s): Newt Minion, MD  ANESTHESIA:   general  EBL:  min ML  SPECIMEN:  Source of Specimen:  Left leg  TOURNIQUET:   Total Tourniquet Time Documented: Thigh (Left) - 6 minutes Total: Thigh (Left) - 6 minutes   PROCEDURE DETAILS: Patient is a 73 year old gentleman severe peripheral vascular disease end-stage renal disease on dialysis with diabetes who presents with gangrene of the right transtibial amputation and left foot gangrene. Do to failure conservative care patient presents at this time for surgical intervention. Risks and benefits were discussed including nonhealing of the wounds and need for higher level amputation. Patient states he understands and wished to proceed at this time. Description of procedure patient brought to operating room and underwent a general anesthetic. After adequate levels of anesthesia were obtained patient's bilateral lower extremities were prepped using DuraPrep draped into a sterile field the left foot was draped out of the sterile field with an impervious stockinette. Attention was first focused on the left leg after time out was called. A transverse incision was made 11 cm distal to the tibial tubercle this curved proximally and a large posterior flap was created. The tibia and fibula were transected just proximal to the skin incision a large posterior flap was created the sciatic nerve was pulled cut on retract the vascular bundles were extremely calcified and were suture ligated with 2-0 silk. The tourniquet was deflated hemostasis was obtained the deep and superficial fascial layers  were closed using #1 PDS. The skin was closed using staples. Attention then focused on the right lower extremity. A fishmouth incision was made through the healthy tissue cutting out the necrotic tissue. The bone was transected approximately 3 cm proximal of both the tibia and fibula. There was good bleeding granulation tissue. The incision was closed using 2-0 nylon. Both wounds were dressed with compressive sterile dressings patient was extubated taken to the PACU in stable condition.  PLAN OF CARE: Admit to inpatient   PATIENT DISPOSITION:  PACU - hemodynamically stable.   Newt Minion, MD 07/01/2013 11:31 AM

## 2013-07-01 NOTE — Progress Notes (Addendum)
Patient status post bilat amputation today. Per the records he has a 55 year smoking history.  Per nurse upon arrival to unit was arousable, now patient more lethargic.  BP  124/54  HR 55 O2 RR 10  And periods of apnea.  O2 sat 93-98% on 2L [place] Temp 97.7 CBG 123 Has received Dilaudis for pain in the PACU.  0.4mg  Narcan given IV.  Patient more responsive but still sleepy, will cough on command.  RR 16  O2 sat 100% on 2L [place] Beth RN notified Dr Sharol Given of event.  Plan d/c narcotics and use tylenol for pain. Patient placed on RA O2 sats 93-98% RN to call if assistance needed.

## 2013-07-01 NOTE — Transfer of Care (Signed)
Immediate Anesthesia Transfer of Care Note  Patient: Chris Vance  Procedure(s) Performed: Procedure(s) with comments: AMPUTATION BELOW KNEE (Left) - Left Below Knee Amputation IRRIGATION AND DEBRIDEMENT EXTREMITY (Right) - Irrigation and Debridement Right Below Knee Amputation  Patient Location: PACU  Anesthesia Type:General  Level of Consciousness: awake and alert   Airway & Oxygen Therapy: Patient Spontanous Breathing and Patient connected to face mask oxygen  Post-op Assessment: Report given to PACU RN  Post vital signs: Reviewed and stable  Complications: No apparent anesthesia complications

## 2013-07-01 NOTE — Progress Notes (Signed)
Pt admitted from PACU. Pt is a dialysis pt and was supposed to have dialysis today. Per PACU RN, pt is to have dialysis tomorrow. Pt noted to have a large knot of R wrist AV shunt but +positive bruit and + thrill. Per PACU RN, pt was seen in recovery room by dialysis MD regarding "knot" area. No new orders regarding area at this time. Pt is lethargic but arousable on admit to floor due to meds given in PACU and anesthesia. Pt is arousable and does answer questions. Pt also follows commands. Pt placed on continuous pulse ox on admit to floor due to lethargy. Pt sats 100% 2 lpm and is bradycardic in the 50's. No visible s/sx of distress noted. Pt had on a depend on admit to floor. Depend removed and pt noted to have a small amount of stool in depend. Pt cleaned with clean and moist and barrier cream applied to bil buttock blanchable to nonblanchable reddness and one open area of right testicle that is bleeding a small amount of bright red blood. No s/sx of infection noted. Will attempt to keep pt turned and floating bil BKA, Pt repts that he had 3 BM's yesterday. Will continue to monitor.

## 2013-07-02 DIAGNOSIS — T82898A Other specified complication of vascular prosthetic devices, implants and grafts, initial encounter: Secondary | ICD-10-CM

## 2013-07-02 LAB — RENAL FUNCTION PANEL
ALBUMIN: 2.2 g/dL — AB (ref 3.5–5.2)
BUN: 68 mg/dL — ABNORMAL HIGH (ref 6–23)
CALCIUM: 8.9 mg/dL (ref 8.4–10.5)
CO2: 24 mEq/L (ref 19–32)
Chloride: 97 mEq/L (ref 96–112)
Creatinine, Ser: 6.77 mg/dL — ABNORMAL HIGH (ref 0.50–1.35)
GFR calc Af Amer: 8 mL/min — ABNORMAL LOW (ref >90)
GFR calc non Af Amer: 7 mL/min — ABNORMAL LOW (ref >90)
GLUCOSE: 86 mg/dL (ref 70–99)
PHOSPHORUS: 6.6 mg/dL — AB (ref 2.3–4.6)
POTASSIUM: 6.1 meq/L — AB (ref 3.7–5.3)
SODIUM: 140 meq/L (ref 137–147)

## 2013-07-02 LAB — CBC
HCT: 31.8 % — ABNORMAL LOW (ref 39.0–52.0)
Hemoglobin: 9.9 g/dL — ABNORMAL LOW (ref 13.0–17.0)
MCH: 29.4 pg (ref 26.0–34.0)
MCHC: 31.1 g/dL (ref 30.0–36.0)
MCV: 94.4 fL (ref 78.0–100.0)
Platelets: 278 10*3/uL (ref 150–400)
RBC: 3.37 MIL/uL — ABNORMAL LOW (ref 4.22–5.81)
RDW: 16.5 % — AB (ref 11.5–15.5)
WBC: 12.3 10*3/uL — ABNORMAL HIGH (ref 4.0–10.5)

## 2013-07-02 LAB — PROTIME-INR
INR: 1.98 — ABNORMAL HIGH (ref 0.00–1.49)
PROTHROMBIN TIME: 21.9 s — AB (ref 11.6–15.2)

## 2013-07-02 LAB — GLUCOSE, CAPILLARY
GLUCOSE-CAPILLARY: 90 mg/dL (ref 70–99)
GLUCOSE-CAPILLARY: 87 mg/dL (ref 70–99)
Glucose-Capillary: 83 mg/dL (ref 70–99)

## 2013-07-02 MED ORDER — LIDOCAINE HCL (PF) 1 % IJ SOLN: 5.0000 mL | INTRAMUSCULAR | Status: DC | PRN

## 2013-07-02 MED ORDER — LIDOCAINE-PRILOCAINE 2.5-2.5 % EX CREA
1.0000 "application " | TOPICAL_CREAM | CUTANEOUS | Status: DC | PRN
  Filled 2013-07-02: qty 5

## 2013-07-02 MED ORDER — PENTAFLUOROPROP-TETRAFLUOROETH EX AERO: 1.0000 "application " | INHALATION_SPRAY | CUTANEOUS | Status: DC | PRN

## 2013-07-02 MED ORDER — SODIUM CHLORIDE 0.9 % IV SOLN: 100.0000 mL | INTRAVENOUS | Status: DC | PRN

## 2013-07-02 MED ORDER — HEPARIN SODIUM (PORCINE) 1000 UNIT/ML DIALYSIS: 1000.0000 [IU] | INTRAMUSCULAR | Status: DC | PRN

## 2013-07-02 MED ORDER — NEPRO/CARBSTEADY PO LIQD
237.0000 mL | ORAL | Status: DC | PRN
  Filled 2013-07-02: qty 237

## 2013-07-02 MED ORDER — ALTEPLASE 2 MG IJ SOLR
2.0000 mg | Freq: Once | INTRAMUSCULAR | Status: AC | PRN
  Filled 2013-07-02: qty 2

## 2013-07-02 NOTE — Progress Notes (Signed)
Patient ID: Chris Vance, male   DOB: 1940/12/09, 73 y.o.   MRN: [id number] Postoperative day 1 left transtibial amputation and revision of the right transtibial amputation. Plan for dialysis today. Plan for discharge to skilled nursing.

## 2013-07-02 NOTE — Procedures (Signed)
I was present at this dialysis session, have reviewed the session itself and made  appropriate changes  Kelly Splinter MD (pgr) [phone]    ([phone] 07/02/2013, 1:23 PM

## 2013-07-02 NOTE — Progress Notes (Signed)
ANTICOAGULATION CONSULT NOTE - Follow Up Consult  Pharmacy Consult for Coumadin Indication: atrial fibrillation  Allergies  Allergen Reactions  . Ace Inhibitors Other (See Comments)    Unknown   . Lisinopril Cough    Patient Measurements: Height: 5\' 10"  (177.8 cm) Weight: 139 lb 1.8 oz (63.1 kg) IBW/kg (Calculated) : 73  Vital Signs: Temp: 98.1 F (36.7 C) (05/07 1504) Temp src: Axillary (05/07 1504) BP: 120/45 mmHg (05/07 1504) Pulse Rate: 57 (05/07 1504)  Labs:  Recent Labs  07/01/13 0805 07/01/13 0810 07/02/13 0551 07/02/13 1102  HGB  --  13.9  --  9.9*  HCT  --  41.0  --  31.8*  PLT  --   --   --  278  APTT 48*  --   --   --   LABPROT 21.4*  --  21.9*  --   INR 1.92*  --  1.98*  --   CREATININE  --   --   --  6.77*   Assessment:     POD#1 left BKA. Noted minimal blood loss, but Hgb 13.9->9.9.   No bleeding noted.  AV fistula noted with area of aneurysmal dilatation but functioning and asymptomatic; observing.     INR is almost therapeutic after Coumadin 4 mg x 1 on 5/6.  Dose held at facility on 5/5 pm for OR on 5/6.  Goal of Therapy:  INR 2-3 Monitor platelets by anticoagulation protocol: Yes   Plan:   Resuming Coumadin 3.5 mg daily today, as per current outpatient regimen.   Daily PT/INR for now.   CBC in am.  Arty Baumgartner, [place] Pager: [phone] 07/02/2013,4:39 PM

## 2013-07-02 NOTE — Progress Notes (Signed)
  [facility] Progress Note   Subjective: No complaints, BP's low on HD  Filed Vitals:   07/02/13 1130 07/02/13 1200 07/02/13 1230 07/02/13 1247  BP: 83/43 79/41 110/39 73/31  Pulse: 55 60 55 54  Temp:      TempSrc:      Resp:      Height:      Weight:      SpO2:       Exam: Pleasant, frail adult male, no distress No jvd Chest clear bilat Irregular,  no MRG Abd soft, NTND Bilat BKA's are wrapped, no LE edema R AVF patent Neuro is nf, ox3  HD: MWF [place] 3h 47  61kg   2/2.0 [place]  Heparin 7800   R AVF  Prof 4 No meds       Assessment: 1 PVD s/p L BKA and revision of R BK wound 2 ESRD on HD 3 HTN- BP's low, vol low 4 Anemia high Hb no epo 5 MBD cont meds 6 Afib on amio, coumadin 7 AVF w aneursymal dilatation- seen by VVS, observe for now 8 Debility- [facility]- HD today, no UF, stop metoprolol due to hypotension    Kelly Splinter MD  pager [phone]    cell [phone]  07/02/2013, 1:17 PM     Recent Labs Lab 07/01/13 0810 07/02/13 1102  NA 137 140  K 4.8 6.1*  CL  --  97  CO2  --  24  GLUCOSE 106* 86  BUN  --  68*  CREATININE  --  6.77*  CALCIUM  --  8.9  PHOS  --  6.6*    Recent Labs Lab 07/02/13 1102  ALBUMIN 2.2*    Recent Labs Lab 07/01/13 0810 07/02/13 1102  WBC  --  12.3*  HGB 13.9 9.9*  HCT 41.0 31.8*  MCV  --  94.4  PLT  --  278   . amiodarone  100 mg Oral BID  . calcium acetate  2,001 mg Oral TID WC  . escitalopram  10 mg Oral Daily  . feeding supplement (NEPRO CARB STEADY)  237 mL Oral TID WC  . gabapentin  300 mg Oral QHS  . insulin aspart  0-9 Units Subcutaneous TID WC  . insulin aspart  3 Units Subcutaneous TID WC  . levothyroxine  50 mcg Oral QAC breakfast  . metoprolol  100 mg Oral BID  . mirtazapine  15 mg Oral QHS  . multivitamin  1 tablet Oral QHS  . warfarin  3.5 mg Oral q1800  . Warfarin - Pharmacist Dosing Inpatient   Does not apply q1800     acetaminophen, metoCLOPramide  (REGLAN) injection, metoCLOPramide, naLOXone (NARCAN)  injection, ondansetron (ZOFRAN) IV, ondansetron

## 2013-07-02 NOTE — Consult Note (Signed)
Vascular Surgery Consultation  Reason for Consult: Pulsatile area right forearm AV fistula  HPI: Chris Vance is a 73 y.o. male who presents for evaluation of prominent pulsation right forearm AV fistula. Patient is in the hospital for transtibial amputation by Dr. Sharol Given and we were consulted to evaluate right radial-cephalic AV fistula. The fistula has been present and functioning nicely for many years. He has large dilated area near arterial anastomosis which has not affect the function of the fistula according to dialysis nurse. Patient denies any pain in this area. Patient is resident of [facility] home.   Past Medical History  Diagnosis Date  . Diabetes mellitus   . Hypertension   . Anemia   . Hyperlipidemia   . Thyroid disease   . Anxiety   . Skin cancer   . Dysrhythmia     Atrial fibrillation   . Peripheral vascular disease   . Diastolic CHF     per physician notes 06/30/13  . Depression   . Renal disorder     dialysis   Past Surgical History  Procedure Laterality Date  . Av fistula placement    . Amputation Right 02/13/2013    Procedure: Right Below Knee Amputation;  Surgeon: Newt Minion, MD;  Location: [facility];  Service: Orthopedics;  Laterality: Right;  Right Below Knee Amputation  . Amputation Left 05/15/2013    Procedure: AMPUTATION metatarsal;  Surgeon: Newt Minion, MD;  Location: [facility];  Service: Orthopedics;  Laterality: Left;   History   Social History  . Marital Status: Divorced    Spouse Name: N/A    Number of Children: N/A  . Years of Education: N/A   Social History Main Topics  . Smoking status: Former Smoker -- 1.00 packs/day for 55 years    Types: Cigarettes    Start date: 02/26/1957  . Smokeless tobacco: Never Used     Comment: No cigarettes x 3-4 weeks.  . Alcohol Use: No  . Drug Use: No  . Sexual Activity: None   Other Topics Concern  . None   Social History Narrative  . None   Family History  Problem Relation Age of  Onset  . Asthma Father    Allergies  Allergen Reactions  . Ace Inhibitors Other (See Comments)    Unknown   . Lisinopril Cough   Prior to Admission medications   Medication Sig Start Date End Date Taking? Authorizing Provider  acetaminophen (TYLENOL) 325 MG tablet Take 650 mg by mouth every 6 (six) hours as needed for mild pain or moderate pain (or Fever >/= 101). 02/16/13  Yes Marrion Coy, MD  Amino Acids-Protein Hydrolys (FEEDING SUPPLEMENT, PRO-STAT SUGAR FREE 64,) LIQD Take 30 mLs by mouth 2 (two) times daily.   Yes Historical Provider, MD  amiodarone (PACERONE) 100 MG tablet Take 1 tablet (100 mg total) by mouth 2 (two) times daily. 05/12/13  Yes Ricard Dillon, MD  calcium acetate (PHOSLO) 667 MG capsule Take 2,001 mg by mouth 3 (three) times daily with meals.   Yes Historical Provider, MD  collagenase (SANTYL) ointment Apply 1 application topically daily.   Yes Historical Provider, MD  doxycycline (VIBRAMYCIN) 100 MG capsule Take 100 mg by mouth 2 (two) times daily.  06/25/13  Yes Historical Provider, MD  escitalopram (LEXAPRO) 10 MG tablet Take 10 mg by mouth daily.   Yes Historical Provider, MD  gabapentin (NEURONTIN) 300 MG capsule Take 300 mg by mouth at bedtime.   Yes  Historical Provider, MD  insulin lispro (HUMALOG) 100 UNIT/ML injection Inject 2-10 Units into the skin 4 (four) times daily -  before meals and at bedtime.   Yes Historical Provider, MD  [facility] 1 G injection Inject 1 g into the muscle daily.  06/25/13  Yes Historical Provider, MD  levothyroxine (SYNTHROID, LEVOTHROID) 25 MCG tablet Take 50 mcg by mouth daily before breakfast.    Yes Historical Provider, MD  lidocaine-prilocaine (EMLA) cream Apply 1 application topically 3 (three) times a week. Monday, Wednesday, Friday.  Dialysis med   Yes Historical Provider, MD  loperamide (IMODIUM A-D) 2 MG tablet Take 1 tablet (2 mg total) by mouth 4 (four) times daily as needed for diarrhea or loose stools. 05/22/13  Yes  Oswald Hillock, MD  metoprolol (LOPRESSOR) 100 MG tablet Take 100 mg by mouth 2 (two) times daily. 02/16/13  Yes Marrion Coy, MD  mirtazapine (REMERON) 15 MG tablet Take 15 mg by mouth at bedtime.  06/23/13  Yes Historical Provider, MD  oxyCODONE-acetaminophen (PERCOCET/ROXICET) 5-325 MG per tablet Take 1-2 tablets by mouth every 4 (four) hours as needed for moderate pain. 1-2 by mouth every 4 hours as needed for moderate pain DO NOT EXCEED 4GM OF TYLENOL IN 24 HOURS 05/22/13  Yes Arlo C Lassen, PA-C  silver sulfADIAZINE (SILVADENE) 1 % cream Apply 1 application topically daily.  06/20/13  Yes Historical Provider, MD  warfarin (COUMADIN) 1 MG tablet Take 1 mg by mouth daily at 6 PM. Give with 2.5 mg tablet for total of 3.5 mg daily   Yes Historical Provider, MD  warfarin (COUMADIN) 2.5 MG tablet Take 2.5 mg by mouth daily at 6 PM. Give with 1 mg tablet for total of 3.5 mg daily   Yes Historical Provider, MD     Positive ROS: Not available  All other systems have been reviewed and were otherwise negative with the exception of those mentioned in the HPI and as above.  Physical Exam: Filed Vitals:   07/02/13 0626  BP: 99/44  Pulse: 55  Temp: 97.6 F (36.4 C)  Resp: 18    General: Alert, no acute distress HEENT: Normal for age Cardiovascular: Regular rate and rhythm. Carotid pulses 2+, no bruits audible Respiratory: Clear to auscultation. No cyanosis, no use of accessory musculature GI: No organomegaly, abdomen is soft and non-tender Skin: No lesions in the area of chief complaint Neurologic: Sensation intact distally Psychiatric: Patient is competent for consent with normal mood and affect Musculoskeletal: No obvious deformities Extremities: Bilateral transtibial amputation lower extremities Right upper extremity with radial cephalic AV fistula with good pulse and palpable thrill. Fistula is dilated over radial arterial anastomosis to about 3 cm in diameter. There is no ulceration  or evidence of infection. 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 Would not recommend any treatment of this as long as fistula is functioning nicely and ulceration or bleeding this not occur.      patient is not complaining of any symptoms in this area. We'll see again at your request  Tinnie Gens, MD 07/02/2013 10:54 AM

## 2013-07-03 ENCOUNTER — Encounter (HOSPITAL_COMMUNITY): Payer: Self-pay | Admitting: Orthopedic Surgery

## 2013-07-03 LAB — CBC
HCT: 33.4 % — ABNORMAL LOW (ref 39.0–52.0)
Hemoglobin: 10.3 g/dL — ABNORMAL LOW (ref 13.0–17.0)
MCH: 29.5 pg (ref 26.0–34.0)
MCHC: 30.8 g/dL (ref 30.0–36.0)
MCV: 95.7 fL (ref 78.0–100.0)
PLATELETS: 256 10*3/uL (ref 150–400)
RBC: 3.49 MIL/uL — ABNORMAL LOW (ref 4.22–5.81)
RDW: 16.6 % — AB (ref 11.5–15.5)
WBC: 11.5 10*3/uL — ABNORMAL HIGH (ref 4.0–10.5)

## 2013-07-03 LAB — PROTIME-INR
INR: 2.43 — ABNORMAL HIGH (ref 0.00–1.49)
PROTHROMBIN TIME: 25.6 s — AB (ref 11.6–15.2)

## 2013-07-03 LAB — GLUCOSE, CAPILLARY
GLUCOSE-CAPILLARY: 87 mg/dL (ref 70–99)
GLUCOSE-CAPILLARY: 94 mg/dL (ref 70–99)
Glucose-Capillary: 90 mg/dL (ref 70–99)
Glucose-Capillary: 118 mg/dL — ABNORMAL HIGH (ref 70–99)
Glucose-Capillary: 67 mg/dL — ABNORMAL LOW (ref 70–99)

## 2013-07-03 MED ORDER — MUPIROCIN CALCIUM 2 % EX CREA
TOPICAL_CREAM | Freq: Two times a day (BID) | CUTANEOUS | Status: AC
  Filled 2013-07-03: qty 15

## 2013-07-03 NOTE — Progress Notes (Signed)
ANTICOAGULATION CONSULT NOTE - Follow Up Consult  Pharmacy Consult for Coumadin Indication: atrial fibrillation  Allergies  Allergen Reactions  . Ace Inhibitors Other (See Comments)    Unknown   . Lisinopril Cough    Patient Measurements: Height: 5\' 10"  (177.8 cm) Weight: 139 lb 1.8 oz (63.1 kg) IBW/kg (Calculated) : 73  Vital Signs: Temp: 99.6 F (37.6 C) (05/08 0505) Temp src: Oral (05/08 0505) BP: 140/50 mmHg (05/08 0505) Pulse Rate: 64 (05/08 0505)  Labs:  Recent Labs  07/01/13 0805  07/01/13 0810 07/02/13 0551 07/02/13 1102 07/03/13 0605  HGB  --   < > 13.9  --  9.9* 10.3*  HCT  --   --  41.0  --  31.8* 33.4*  PLT  --   --   --   --  278 256  APTT 48*  --   --   --   --   --   LABPROT 21.4*  --   --  21.9*  --  25.6*  INR 1.92*  --   --  1.98*  --  2.43*  CREATININE  --   --   --   --  6.77*  --   < > = values in this interval not displayed. Assessment:     POD#2 left BKA. Hgb 13.9->9.9->10.4.  No bleeding noted.  AV fistula noted with area of aneurysmal dilatation but functioning and asymptomatic; observing.     INR is now therapeutic (2.43). Usual 3.5 mg dose given on 5/7. Coumadin 4 mg x 1 on 5/6.  Dose held at facility on 5/5 pm for OR on 5/6.  Goal of Therapy:  INR 2-3 Monitor platelets by anticoagulation protocol: Yes   Plan:   Continue Coumadin 3.5 mg daily, as per current outpatient regimen.   Daily PT/INR for now, but will decrease sticks soon if stable.    Noted plan for meeting with Palliative Care team over the weekend.  Arty Baumgartner, [place] Pager: [phone] 07/03/2013,2:56 PM

## 2013-07-03 NOTE — Progress Notes (Signed)
CSW (Clinical Education officer, museum) spoke with palliative team and asked to please schedule family meeting as soon as possible. Chris Vance with Palliative informed CSW they would attempt to make contact with pt son today to arrange weekend meeting.  CSW will continue to follow and assist as necessary.  Chris Vance, Chris Vance

## 2013-07-03 NOTE — Progress Notes (Signed)
Patient ID: Chris Vance, male   DOB: [birth year], 73 y.o.   MRN: [id number] Dressings clean and dry status post bilateral transtibial amputations. F. L2 is signed. Discharge paperwork completed. Plan for discharge to skilled nursing facility. Patient is alert this morning and talking.

## 2013-07-03 NOTE — Progress Notes (Signed)
  [facility] Progress Note   Subjective: confused today; talk with pt's son who said that last night pt told him that he only "had 2 weeks left and he was done", pt not responding appropriately at this time  Filed Vitals:   07/02/13 1416 07/02/13 1504 07/02/13 2112 07/03/13 0505  BP: 128/46 120/45 136/44 140/50  Pulse: 57 57 64 64  Temp: 98 F (36.7 C) 98.1 F (36.7 C) 98.9 F (37.2 C) 99.6 F (37.6 C)  TempSrc: Oral Axillary Oral Oral  Resp: 18 16 16 16   Height:      Weight: 63.1 kg (139 lb 1.8 oz)     SpO2: 100% 97% 97% 100%   Exam: Adult male, confused, babbling No jvd Chest clear bilat Irregular,  no MRG Abd soft, NTND Bilat BKA's are wrapped, no LE edema R AVF patent Neuro is nf, ox1  HD: MWF [place] 3h 52  61kg   2/2.0 [place]  Heparin 7800   R AVF  Prof 4 No meds       Assessment: 1 PVD s/p L BKA and revision of R BK wound 2 ESRD on HD 3 HTN- BP's low, vol low 4 Anemia high Hb no epo 5 MBD cont meds 6 Afib on amio, coumadin 7 AVF w aneursymal dilatation- seen by VVS, observe for now 8 Debility- lives at [facility]- HD tomorrow, off schedule. I spoke with pt's son and recommended consideration of HD withdrawal. He was open to the possibility and agreed to meet with palliative care.      Kelly Splinter MD  pager [phone]    cell [phone]  07/03/2013, 10:18 AM     Recent Labs Lab 07/01/13 0810 07/02/13 1102  NA 137 140  K 4.8 6.1*  CL  --  97  CO2  --  24  GLUCOSE 106* 86  BUN  --  68*  CREATININE  --  6.77*  CALCIUM  --  8.9  PHOS  --  6.6*    Recent Labs Lab 07/02/13 1102  ALBUMIN 2.2*    Recent Labs Lab 07/01/13 0810 07/02/13 1102 07/03/13 0605  WBC  --  12.3* 11.5*  HGB 13.9 9.9* 10.3*  HCT 41.0 31.8* 33.4*  MCV  --  94.4 95.7  PLT  --  278 256   . amiodarone  100 mg Oral BID  . calcium acetate  2,001 mg Oral TID WC  . escitalopram  10 mg Oral Daily  . feeding supplement (NEPRO CARB STEADY)  237 mL  Oral TID WC  . gabapentin  300 mg Oral QHS  . insulin aspart  0-9 Units Subcutaneous TID WC  . insulin aspart  3 Units Subcutaneous TID WC  . levothyroxine  50 mcg Oral QAC breakfast  . mirtazapine  15 mg Oral QHS  . multivitamin  1 tablet Oral QHS  . mupirocin cream   Topical BID  . warfarin  3.5 mg Oral q1800  . Warfarin - Pharmacist Dosing Inpatient   Does not apply q1800     sodium chloride, sodium chloride, acetaminophen, feeding supplement (NEPRO CARB STEADY), heparin, lidocaine (PF), lidocaine-prilocaine, metoCLOPramide (REGLAN) injection, metoCLOPramide, naLOXone (NARCAN)  injection, ondansetron (ZOFRAN) IV, ondansetron, pentafluoroprop-tetrafluoroeth

## 2013-07-03 NOTE — Discharge Summary (Signed)
Physician Discharge Summary  Patient ID: Chris Vance MRN: [id number] DOB/AGE: 10/17/1940 73 y.o.  Admit date: 07/01/2013 Discharge date: 07/03/2013  Admission Diagnoses: Left lower extremity gangrene with dehiscence right transtibial amputation  Discharge Diagnoses: Same Active Problems:   Gangrene associated with diabetes mellitus   Discharged Condition: stable  Hospital Course: Patient's hospital course was essentially unremarkable. He underwent a left transtibial amputation and revision of the right transtibial amputation. Postoperatively patient was stable and was discharged back to skilled nursing.  Consults: nephrology  Significant Diagnostic Studies: labs: Routine labs  Treatments: dialysis: Hemodialysis and surgery: See operative note  Discharge Exam: Blood pressure 140/50, pulse 64, temperature 99.6 F (37.6 C), temperature source Oral, resp. rate 16, height 5\' 10"  (1.778 m), weight 63.1 kg (139 lb 1.8 oz), SpO2 100.00%. Incision/Wound: dressing clean dry and intact  Disposition: 01-Home or Self Care  Discharge Orders   Future Orders Complete By Expires   Call MD / Call 911  As directed    Constipation Prevention  As directed    Diet - low sodium heart healthy  As directed    Increase activity slowly as tolerated  As directed        Medication List         acetaminophen 325 MG tablet  Commonly known as:  TYLENOL  Take 650 mg by mouth every 6 (six) hours as needed for mild pain or moderate pain (or Fever >/= 101).     amiodarone 100 MG tablet  Commonly known as:  PACERONE  Take 1 tablet (100 mg total) by mouth 2 (two) times daily.     calcium acetate 667 MG capsule  Commonly known as:  PHOSLO  Take 2,001 mg by mouth 3 (three) times daily with meals.     collagenase ointment  Commonly known as:  SANTYL  Apply 1 application topically daily.     doxycycline 100 MG capsule  Commonly known as:  VIBRAMYCIN  Take 100 mg by mouth 2 (two) times daily.      escitalopram 10 MG tablet  Commonly known as:  LEXAPRO  Take 10 mg by mouth daily.     feeding supplement (PRO-STAT SUGAR FREE 64) Liqd  Take 30 mLs by mouth 2 (two) times daily.     gabapentin 300 MG capsule  Commonly known as:  NEURONTIN  Take 300 mg by mouth at bedtime.     insulin lispro 100 UNIT/ML injection  Commonly known as:  HUMALOG  Inject 2-10 Units into the skin 4 (four) times daily -  before meals and at bedtime.     INVANZ 1 G injection  Generic drug:  ertapenem  Inject 1 g into the muscle daily.     levothyroxine 25 MCG tablet  Commonly known as:  SYNTHROID, LEVOTHROID  Take 50 mcg by mouth daily before breakfast.     lidocaine-prilocaine cream  Commonly known as:  EMLA  Apply 1 application topically 3 (three) times a week. Monday, Wednesday, Friday.  Dialysis med     loperamide 2 MG tablet  Commonly known as:  IMODIUM A-D  Take 1 tablet (2 mg total) by mouth 4 (four) times daily as needed for diarrhea or loose stools.     metoprolol 100 MG tablet  Commonly known as:  LOPRESSOR  Take 100 mg by mouth 2 (two) times daily.     mirtazapine 15 MG tablet  Commonly known as:  REMERON  Take 15 mg by mouth at bedtime.  oxyCODONE-acetaminophen 5-325 MG per tablet  Commonly known as:  PERCOCET/ROXICET  Take 1-2 tablets by mouth every 4 (four) hours as needed for moderate pain. 1-2 by mouth every 4 hours as needed for moderate pain DO NOT EXCEED 4GM OF TYLENOL IN 24 HOURS     silver sulfADIAZINE 1 % cream  Commonly known as:  SILVADENE  Apply 1 application topically daily.     warfarin 1 MG tablet  Commonly known as:  COUMADIN  Take 1 mg by mouth daily at 6 PM. Give with 2.5 mg tablet for total of 3.5 mg daily     warfarin 2.5 MG tablet  Commonly known as:  COUMADIN  Take 2.5 mg by mouth daily at 6 PM. Give with 1 mg tablet for total of 3.5 mg daily           Follow-up Information   Follow up with DUDA,MARCUS V, MD In 2 weeks.   Specialty:   Orthopedic Surgery   Contact information:   [place] [address] [phone]       Signed: Newt Minion 07/03/2013, 6:45 AM

## 2013-07-03 NOTE — Progress Notes (Signed)
Dr. Jonnie Finner would like to be included in palliative care meeting with family.  Please notify when meeting is scheduled.

## 2013-07-03 NOTE — Progress Notes (Signed)
Clinical Social Work Department BRIEF PSYCHOSOCIAL ASSESSMENT 07/03/2013  Patient:  Chris Vance, Chris Vance     Account Number:  [id number]     Admit date:  07/01/2013  Clinical Social Worker:  Adair Laundry  Date/Time:  07/03/2013 11:50 AM  Referred by:  Physician  Date Referred:  07/03/2013 Referred for  SNF Placement   Other Referral:   Interview type:  Family Other interview type:   Spoke with pt son over the phone    PSYCHOSOCIAL DATA Living Status:  FACILITY Admitted from facility:  [facility] Level of care:  Index Primary support name:  Ishan Sanroman Primary support relationship to patient:  CHILD, ADULT Degree of support available:   Pt has good family support    CURRENT CONCERNS Current Concerns  Post-Acute Placement   Other Concerns:    SOCIAL WORK ASSESSMENT / PLAN CSW made aware that pt was admitted from facility and has discharge order for today. CSW called and spoke with pt son, Harrell Gave. Pt son informed CSW pt is from [place], but he was informed by MD this morning that pt would remain in hospital and potentially transition to hospice home. CSW apologized for confusion and informed pt son that CSW would clarify with MD and call back. Pt son very understanding. CSW called pt nurse who informed CSW Orthopedic MD has discharged pt but shortly after Nephrology MD recommended palliative care meeting. CSW spoke with Dr. Jonnie Finner who informed CSW that highly likely that pt will no longer be HD patient and is very important that pt family meet with palliative team to discuss. CSW asked if this meeting could take place back at SNF. MD informed CSW this would not be in best interest of pt at this time. CSW called Dr. Sharol Given to inform of discussion. Dr. Sharol Given to cancel dc to SNF until family has had palliative meeting. Pt nurse to insure palliative consult ordered. CSW called and left voicemail for pt son informing that palliative  consult being ordered and MD wanting this to take place before pt discharges to any facility.  CSW did call and leave message for [facility] admissions to inform of possible weekend discharge since current plan is still undecided. Awaiting return phone call.   Assessment/plan status:  Psychosocial Support/Ongoing Assessment of Needs Other assessment/ plan:   Information/referral to community resources:   None needed at this time    PATIENT'S/FAMILY'S RESPONSE TO PLAN OF CARE: Pt family undecided on current plan of care. Awaiting palliative meeting.        [place], [place]

## 2013-07-04 DIAGNOSIS — Z515 Encounter for palliative care: Secondary | ICD-10-CM

## 2013-07-04 DIAGNOSIS — Z66 Do not resuscitate: Secondary | ICD-10-CM | POA: Diagnosis present

## 2013-07-04 DIAGNOSIS — I96 Gangrene, not elsewhere classified: Secondary | ICD-10-CM

## 2013-07-04 DIAGNOSIS — E1159 Type 2 diabetes mellitus with other circulatory complications: Principal | ICD-10-CM

## 2013-07-04 LAB — PROTIME-INR
INR: 3.78 — ABNORMAL HIGH (ref 0.00–1.49)
Prothrombin Time: 35.9 seconds — ABNORMAL HIGH (ref 11.6–15.2)

## 2013-07-04 LAB — GLUCOSE, CAPILLARY
GLUCOSE-CAPILLARY: 92 mg/dL (ref 70–99)
GLUCOSE-CAPILLARY: 109 mg/dL — AB (ref 70–99)
GLUCOSE-CAPILLARY: 81 mg/dL (ref 70–99)
GLUCOSE-CAPILLARY: 143 mg/dL — AB (ref 70–99)

## 2013-07-04 MED ORDER — DIAZEPAM 5 MG/ML IJ SOLN
2.5000 mg | INTRAMUSCULAR | Status: DC | PRN
  Administered 2013-07-06 – 2013-07-08 (×2): 2.5 mg via INTRAVENOUS
  Filled 2013-07-04 (×2): qty 2

## 2013-07-04 MED ORDER — HYDROMORPHONE HCL PF 1 MG/ML IJ SOLN
0.5000 mg | INTRAMUSCULAR | Status: DC | PRN
Start: 2013-07-04 — End: 2013-07-08
  Administered 2013-07-04 – 2013-07-06 (×2): 0.5 mg via INTRAVENOUS
  Administered 2013-07-08: 1 mg via INTRAVENOUS
  Filled 2013-07-04 (×4): qty 1

## 2013-07-04 NOTE — Progress Notes (Signed)
ANTICOAGULATION CONSULT NOTE - Follow Up Consult  Pharmacy Consult for Coumadin Indication: atrial fibrillation  Allergies  Allergen Reactions  . Ace Inhibitors Other (See Comments)    Unknown   . Lisinopril Cough    Patient Measurements: Height: 5\' 10"  (177.8 cm) Weight: 139 lb 1.8 oz (63.1 kg) IBW/kg (Calculated) : 73  Vital Signs: Temp: 97.6 F (36.4 C) (05/09 0547) Temp src: Oral (05/09 0547) BP: 115/51 mmHg (05/09 0547) Pulse Rate: 69 (05/09 0547)  Labs:  Recent Labs  07/02/13 0551 07/02/13 1102 07/03/13 0605 07/04/13 0532  HGB  --  9.9* 10.3*  --   HCT  --  31.8* 33.4*  --   PLT  --  278 256  --   LABPROT 21.9*  --  25.6* 35.9*  INR 1.98*  --  2.43* 3.78*  CREATININE  --  6.77*  --   --    Assessment: Coumadin PTA for Afib S/p BKA  INR elevated today at 3.78  Goal of Therapy:  INR 2-3 Monitor platelets by anticoagulation protocol: Yes   Plan:  1) No Coumadin today 2) Continue to follow daily INR  Thank you. Anette Guarneri, PharmD [phone] 07/04/2013,11:45 AM

## 2013-07-04 NOTE — Consult Note (Signed)
Palliative Medicine Team at [facility]  Date: 07/04/2013   Patient Name: Chris Vance  DOB: [birth year]  MRN: [id number]  Age / Sex: 73 y.o., male   PCP: Tamsen Roers, MD Referring Physician: Newt Minion, MD  HPI/Reason for Consultation: 73 yo man with ESRD HD dependent, bilateral amputee, 4 admissions in last 6 months and multiple ED visits related to falls from his wheelchair from [facility]. He is now post op from left LE gangrene with transtibial amputation. He is confused, not eating or taking medications and more difficulty tolerating HD. PMT consulted for goals of care discussion.  Participants in Discussion: Patient (limited cognition and capacity), patient's son Vickey Sages of Discussion:  1. Code Status:  DNR  2. Scope of Treatment:  For now comfort measures with limited interventions until full goals of care can be established-patient's son would like his uncle to be involved in decision making and planning process.  Discontinue non-essential medications and focus on comfort, use pain medications as indicated.  3. Assessment/Plan:  Chris Vance is extremely debilitated, he is confused this morning, but able to have a conversation-although details are not clear- I asked him about his QOL-about what made him happy or the last time he felt happy- he told me he just survives and that the last joyous this he did was go to [place] with a group from [place] and he "didnt even have a dime in his pocket". I further asked him if he had though about what stopping HD would mean or if he wanted to talk about that- and he said "well that doesn't sound good, but Im getting pretty tired of all this". I asked him about dying and he said he was ready when it was his time. I called his son who understands severity of illness and that HD becoming more difficult-that is dad would not tolerate sitting in a chair for outpatient HD and that his dad is not eating or taking other  meds. I discussed Hospice as a compassionate choice to provide dignity as his dad faces the end of his life-he was appreciative of the call and wants to meet at [facility] tomorrow to discuss this transition and wants patient's brother involved in the planning process.    Primary Diagnoses  1. ESRD, HD dependent 2. Severe PVD, sp transtibial amputation 3. Bilateral maputee 4. Encephalopathy  Prognosis: very poor, if HD stopped <10 days   PPS 20    Active Symptoms 1. C/o pain in his neck, in his right hip and abdomen 2. Severe fatigue 3. "Seeing things"  4. Palliative Prophylaxis:   Bowel Regimen   Terminal Secretions  Breakthrough Pain and Dyspnea   Agitation and Delirium  Nausea  5. Psychosocial Spiritual Asssessment/Interventions:  Patient and Family Adjustment to Illness/Prognosis: Appropriate grief   Spiritual Concerns or Needs: will consult chaplain  6. Disposition I anticipate that after tomorrow's meeting we will transition him to full comfort care and he will need [place] placement-will solidify this plan tomorrow. For now provide comfort as primary goal.  Time: 50 minutes. 1000AM-1050AM Greater than 50%  of this time was spent counseling and coordinating care related to the above assessment and plan.  Signed by: Acquanetta Chain, DO  07/04/2013, 10:29 AM  Please contact Palliative Medicine Team phone at [phone] for questions and concerns.

## 2013-07-04 NOTE — Progress Notes (Signed)
   Subjective:  Patient reports pain as mild.  No events  Objective:   VITALS:   Filed Vitals:   07/03/13 0505 07/03/13 1518 07/03/13 2055 07/04/13 0547  BP: 140/50 98/75 124/78 115/51  Pulse: 64 70 72 69  Temp: 99.6 F (37.6 C) 100.8 F (38.2 C) 98.2 F (36.8 C) 97.6 F (36.4 C)  TempSrc: Oral Oral Oral Oral  Resp: 16 16 16 16   Height:      Weight:      SpO2: 100% 98% 96% 100%    Incision: dressing C/D/I and no drainage No cellulitis present Compartment soft   Lab Results  Component Value Date   WBC 11.5* 07/03/2013   HGB 10.3* 07/03/2013   HCT 33.4* 07/03/2013   MCV 95.7 07/03/2013   PLT 256 07/03/2013     Assessment/Plan:  3 Days Post-Op   - Expected postop acute blood loss anemia - will monitor for symptoms - Up with PT/OT - DVT ppx - SCDs, ambulation, coumadin - NWB bilateral and lower extremity - Pain control - Discharge planning  Problem List Items Addressed This Visit     Endocrine   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

## 2013-07-04 NOTE — Progress Notes (Signed)
Subjective:  Alert, but very weak with difficulty speaking, no complaints  Objective: Vital signs in last 24 hours: Temp:  [97.6 F (36.4 C)-100.8 F (38.2 C)] 97.6 F (36.4 C) (05/09 0547) Pulse Rate:  [69-72] 69 (05/09 0547) Resp:  [16] 16 (05/09 0547) BP: (98-124)/(51-78) 115/51 mmHg (05/09 0547) SpO2:  [96 %-100 %] 100 % (05/09 0547) Weight change:   Intake/Output from previous day: 05/08 0701 - 05/09 0700 In: 50 [P.O.:50] Out: -    Lab Results:  Recent Labs  07/02/13 1102 07/03/13 0605  WBC 12.3* 11.5*  HGB 9.9* 10.3*  HCT 31.8* 33.4*  PLT 278 256   BMET:  Recent Labs  07/02/13 1102  NA 140  K 6.1*  CL 97  CO2 24  GLUCOSE 86  BUN 68*  CREATININE 6.77*  CALCIUM 8.9  ALBUMIN 2.2*   No results found for this basename: PTH,  in the last 72 hours Iron Studies: No results found for this basename: IRON, TIBC, TRANSFERRIN, FERRITIN,  in the last 72 hours  Studies/Results: No results found.  EXAM: General appearance:  Alert, but very frail, in no apparent distress Resp:  CTA without rales, rhonchi, or wheezes Cardio:  RRR without murmur or rub GI:  + BS, soft and nontender Extremities:  B BKAs wrapped, no edema Access: AVF @ RFA with + bruit  [facility]  3h 45 61kg 2/2.0 [place] Heparin 7800 R AVF Prof 4  No meds  Assessment/Plan: 1. PVD - s/p L BKA with revision of R BKA 5/6 by Dr. Sharol Given. 2. ESRD - HD on MWF @ [place].  HD today off-schedule. 3. HTN/Volume - BP 115/51, no meds; wt 63.1 kg with HD pending.  4. Anemia - Hgb 10.3, no Aranesp. 5. 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 6. Nutrition - Alb 2.2, renal diet, vitamin. 7. A-fib - on Amiodarone, Coumadin. 8. Aneurysmal dilatation of AVF - seen by VVS, no intervention at this time.  9. Debility - currently @ [facility].    LOS: 3 days   Ramiro Harvest 07/04/2013,9:01 AM  Pt seen, examined and agree w A/P as above. Hold on HD today, this will not have a  great clinical impact and will allow for palliative care discussions to be completed which should be over the weekend.  Kelly Splinter MD pager [phone]    cell [phone] 07/04/2013, 11:13 AM

## 2013-07-04 NOTE — Progress Notes (Signed)
Nutrition Brief Note  Patient identified on Malnutrition Screening Tool Chart reviewed. Pt now transitioning to comfort care.  No further nutrition interventions warranted at this time.  Please re-consult as needed.   Loyce Klasen F Emanuele Mcwhirter MS RD LDN Clinical Dietitian Pager:[phone]    

## 2013-07-05 LAB — GLUCOSE, CAPILLARY
Glucose-Capillary: 118 mg/dL — ABNORMAL HIGH (ref 70–99)
Glucose-Capillary: 124 mg/dL — ABNORMAL HIGH (ref 70–99)
Glucose-Capillary: 152 mg/dL — ABNORMAL HIGH (ref 70–99)

## 2013-07-05 LAB — PROTIME-INR
INR: 4.47 — ABNORMAL HIGH (ref 0.00–1.49)
Prothrombin Time: 40.8 seconds — ABNORMAL HIGH (ref 11.6–15.2)

## 2013-07-05 NOTE — Progress Notes (Signed)
Patient has refused medications.

## 2013-07-05 NOTE — Progress Notes (Signed)
Subjective:  Patient reports pain as mild.  No events  Objective:   VITALS:   Filed Vitals:   07/04/13 0547 07/04/13 1544 07/04/13 2132 07/05/13 0614  BP: 115/51 122/50 119/48 114/41  Pulse: 69 66 77 77  Temp: 97.6 F (36.4 C) 98.4 F (36.9 C) 99.6 F (37.6 C) 99.6 F (37.6 C)  TempSrc: Oral Oral Oral Oral  Resp: 16 16 16 16   Height:      Weight:      SpO2: 100% 98% 97% 97%    Incision: dressing C/D/I and no drainage No cellulitis present Compartment soft Sleeping comfortably   Lab Results  Component Value Date   WBC 11.5* 07/03/2013   HGB 10.3* 07/03/2013   HCT 33.4* 07/03/2013   MCV 95.7 07/03/2013   PLT 256 07/03/2013     Assessment/Plan:  4 Days Post-Op   - Expected postop acute blood loss anemia - will monitor for symptoms - Up with PT/OT - DVT ppx - SCDs, ambulation, coumadin - NWB bilateral and lower extremity - Pain control - hospice following, appreciate   Problem List Items Addressed This Visit     Endocrine   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

## 2013-07-05 NOTE — Progress Notes (Signed)
ANTICOAGULATION CONSULT NOTE - Follow Up Consult  Pharmacy Consult for Coumadin Indication: atrial fibrillation  Allergies  Allergen Reactions  . Ace Inhibitors Other (See Comments)    Unknown   . Lisinopril Cough    Patient Measurements: Height: 5\' 10"  (177.8 cm) Weight: 139 lb 1.8 oz (63.1 kg) IBW/kg (Calculated) : 73  Vital Signs: Temp: 99.6 F (37.6 C) (05/10 0614) Temp src: Oral (05/10 0614) BP: 114/41 mmHg (05/10 0614) Pulse Rate: 77 (05/10 0614)  Labs:  Recent Labs  07/03/13 0605 07/04/13 0532 07/05/13 0355  HGB 10.3*  --   --   HCT 33.4*  --   --   PLT 256  --   --   LABPROT 25.6* 35.9* 40.8*  INR 2.43* 3.78* 4.47*   Assessment: Coumadin PTA for Afib S/p BKA  INR elevated today at 4.47  Goal of Therapy:  INR 2-3 Monitor platelets by anticoagulation protocol: Yes   Plan:  1) No Coumadin today 2) Continue to follow daily INR  Thank you. Anette Guarneri, PharmD [phone] 07/05/2013,12:33 PM

## 2013-07-05 NOTE — Progress Notes (Signed)
Subjective:  Alert, but very weak with difficulty speaking, no complaints  Objective: Vital signs in last 24 hours: Temp:  [98.4 F (36.9 C)-99.6 F (37.6 C)] 99.6 F (37.6 C) (05/10 0614) Pulse Rate:  [66-77] 77 (05/10 0614) Resp:  [16] 16 (05/10 0614) BP: (114-122)/(41-50) 114/41 mmHg (05/10 0614) SpO2:  [97 %-98 %] 97 % (05/10 0614) Weight change:   Intake/Output from previous day: 05/09 0701 - 05/10 0700 In: 120 [P.O.:120] Out: 0    Lab Results:  Recent Labs  07/03/13 0605  WBC 11.5*  HGB 10.3*  HCT 33.4*  PLT 256   BMET: No results found for this basename: NA, K, CL, CO2, GLUCOSE, BUN, CREATININE, CALCIUM, PHOSPHORUS, ALBUMIN,  in the last 72 hours No results found for this basename: PTH,  in the last 72 hours Iron Studies: No results found for this basename: IRON, TIBC, TRANSFERRIN, FERRITIN,  in the last 72 hours  Studies/Results: No results found.  EXAM: General appearance:  Alert, but very frail, in no apparent distress Resp:  CTA without rales, rhonchi, or wheezes Cardio:  RRR without murmur or rub GI:  + BS, soft and nontender Extremities:  B BKAs wrapped, no edema Access: AVF @ RFA with + bruit  [facility]  3h 62 61kg 2/2.0 [place] Heparin 7800 R AVF Prof 4  No meds  Assessment/Plan: 1 S/P L BKA and revision of R BKA 5/6  2 ESRD holding HD for pall care discussions 3 HTN stable no meds 4 Anemia on aranesp 5 HTPH cont meds 6 Malnutrition - Alb 2.2, renal diet, vitamin. 7 A-fib on amio , coumadin 8 EOL- 2nd meeting today with palliative care, probable transition to comfort  9 Debility came from [place] MD (pgr) [phone]    ([phone] 07/05/2013, 12:07 PM

## 2013-07-06 LAB — GLUCOSE, CAPILLARY
GLUCOSE-CAPILLARY: 118 mg/dL — AB (ref 70–99)
Glucose-Capillary: 122 mg/dL — ABNORMAL HIGH (ref 70–99)

## 2013-07-06 LAB — PROTIME-INR
INR: 3.94 — AB (ref 0.00–1.49)
PROTHROMBIN TIME: 37 s — AB (ref 11.6–15.2)

## 2013-07-06 NOTE — Progress Notes (Signed)
Patient ID: Chris Vance, male   DOB: 12-31-1940, 73 y.o.   MRN: [id number] Patient under evaluation for hospice care. Comfort Care treatment at this time per family request. Patient is resting comfortably this morning.

## 2013-07-06 NOTE — Progress Notes (Signed)
[facility] ROUNDING NOTE   Subjective:   Interval History: patient comfortable. Hospice and palliative care involved  Objective:  Vital signs in last 24 hours:  Temp:  [99 F (37.2 C)-99.9 F (37.7 C)] 99.1 F (37.3 C) (05/11 0608) Pulse Rate:  [77-80] 78 (05/11 0608) Resp:  [16] 16 (05/11 0608) BP: (123-137)/(51-58) 135/58 mmHg (05/11 0608) SpO2:  [96 %-99 %] 99 % (05/11 0608)  Weight change:  Filed Weights   07/01/13 0903 07/02/13 1018 07/02/13 1416  Weight: 63.957 kg (141 lb) 64 kg (141 lb 1.5 oz) 63.1 kg (139 lb 1.8 oz)    Intake/Output: I/O last 3 completed shifts: In: 480 [P.O.:480] Out: 0    Intake/Output this shift:     eral appearance: Alert, but very frail, in no apparent distress  Resp: CTA without rales, rhonchi, or wheezes  Cardio: RRR without murmur or rub  GI: + BS, soft and nontender  Extremities: B BKAs wrapped, no edema  Access: AVF @ RFA with + bruit    Basic Metabolic Panel:  Recent Labs Lab 07/01/13 0810 07/02/13 1102  NA 137 140  K 4.8 6.1*  CL  --  97  CO2  --  24  GLUCOSE 106* 86  BUN  --  68*  CREATININE  --  6.77*  CALCIUM  --  8.9  PHOS  --  6.6*    Liver Function Tests:  Recent Labs Lab 07/02/13 1102  ALBUMIN 2.2*   No results found for this basename: LIPASE, AMYLASE,  in the last 168 hours No results found for this basename: AMMONIA,  in the last 168 hours  CBC:  Recent Labs Lab 07/01/13 0810 07/02/13 1102 07/03/13 0605  WBC  --  12.3* 11.5*  HGB 13.9 9.9* 10.3*  HCT 41.0 31.8* 33.4*  MCV  --  94.4 95.7  PLT  --  278 256    Cardiac Enzymes: No results found for this basename: CKTOTAL, CKMB, CKMBINDEX, TROPONINI,  in the last 168 hours  BNP: No components found with this basename: POCBNP,   CBG:  Recent Labs Lab 07/04/13 2134 07/05/13 0705 07/05/13 1134 07/05/13 2157 07/06/13 0635  GLUCAP 143* 118* 124* 152* 118*    Microbiology: Results for orders placed during the  hospital encounter of 05/12/13  CLOSTRIDIUM DIFFICILE BY PCR     Status: None   Collection Time    05/21/13  2:00 PM      Result Value Ref Range Status   C difficile by pcr NEGATIVE  NEGATIVE Final    Coagulation Studies:  Recent Labs  07/04/13 0532 07/05/13 0355 07/06/13 0533  LABPROT 35.9* 40.8* 37.0*  INR 3.78* 4.47* 3.94*    Urinalysis: No results found for this basename: COLORURINE, APPERANCEUR, LABSPEC, PHURINE, GLUCOSEU, HGBUR, BILIRUBINUR, KETONESUR, PROTEINUR, UROBILINOGEN, NITRITE, LEUKOCYTESUR,  in the last 72 hours    Imaging: No results found.   Medications:     . amiodarone  100 mg Oral BID  . calcium acetate  2,001 mg Oral TID WC  . feeding supplement (NEPRO CARB STEADY)  237 mL Oral TID WC  . gabapentin  300 mg Oral QHS  . mupirocin cream   Topical BID  . Warfarin - Pharmacist Dosing Inpatient   Does not apply q1800   sodium chloride, sodium chloride, acetaminophen, diazepam, feeding supplement (NEPRO CARB STEADY), heparin, HYDROmorphone (DILAUDID) injection, lidocaine (PF), lidocaine-prilocaine, ondansetron (ZOFRAN) IV, pentafluoroprop-tetrafluoroeth  Assessment/ Plan:   Hospice involved and not proceeding with dialysis, refusing medication  LOS: [place] @TODAY @8 :38 AM

## 2013-07-06 NOTE — Progress Notes (Addendum)
Family has again not made our meeting- "another family member told him it had been rescheduled". I stressed to Gerald Stabs his son the importance of him coming to the hospital -he can come later today after 5PM, but a provider is not available at that time. I insisted on early AM appointment as well but his son Harrell Gave cannot be here until after 5PM. I explained that it was urgently important for [place] to make/solidify a comfort care path and determine a discharge plan for his father-and that it may not be possible to continue to keep him in the hospital.   Mr. Lemmerman was fairly coherent today-he is scared and unable to make a decisions specifically about his EOL care. The patient himself told me he thought we were supposed to be having a meeting this afternoon. I am going to request  A chaplain consult to help address some of his fears. 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 Need to clarify that HD no longer being offered as a medically reasonable choice.  Rescheduled for 3:30PM tomorrow- I attempted phone goals of care discussion but son wants bother to be present also.  Lane Hacker, DO Palliative Medicine

## 2013-07-06 NOTE — Progress Notes (Signed)
CSW (Clinical Education officer, museum) continues to follow and await palliative meeting with family. Will assist as needed.  [place], [place]

## 2013-07-06 NOTE — Progress Notes (Signed)
ANTICOAGULATION CONSULT NOTE - Follow Up Consult  Pharmacy Consult for Coumadin Indication: atrial fibrillation  Allergies  Allergen Reactions  . Ace Inhibitors Other (See Comments)    Unknown   . Lisinopril Cough    Patient Measurements: Height: 5\' 10"  (177.8 cm) Weight: 139 lb 1.8 oz (63.1 kg) IBW/kg (Calculated) : 73  Vital Signs: Temp: 98.2 F (36.8 C) (05/11 1335) Temp src: Oral (05/11 0608) BP: 135/60 mmHg (05/11 1335) Pulse Rate: 81 (05/11 1335)  Labs:  Recent Labs  07/04/13 0532 07/05/13 0355 07/06/13 0533  LABPROT 35.9* 40.8* 37.0*  INR 3.78* 4.47* 3.94*   Assessment: 73 yom continues on warfarin for afib. INR remains elevated at 3.94. No new CBC today, no overt bleeding noted.   Goal of Therapy:  INR 2-3   Plan:  1. No coumadin tonight 2. F/u AM INR  3. F/u care plan  Salome Arnt, PharmD, BCPS Pager # [phone] 07/06/2013 1:46 PM

## 2013-07-06 NOTE — Progress Notes (Signed)
Patient was having s/s of spasms, PRN Valium given.  Med effective.

## 2013-07-06 NOTE — Progress Notes (Signed)
Palliative meeting was scheduled for yesterday at [place] [facility], family however thought consult was for Monday 5/11 at 3PM-will see them today.  Lane Hacker, DO Palliative Medicine

## 2013-07-07 DIAGNOSIS — E1159 Type 2 diabetes mellitus with other circulatory complications: Secondary | ICD-10-CM

## 2013-07-07 DIAGNOSIS — Z515 Encounter for palliative care: Secondary | ICD-10-CM

## 2013-07-07 DIAGNOSIS — I96 Gangrene, not elsewhere classified: Secondary | ICD-10-CM

## 2013-07-07 LAB — GLUCOSE, CAPILLARY
Glucose-Capillary: 121 mg/dL — ABNORMAL HIGH (ref 70–99)
Glucose-Capillary: 133 mg/dL — ABNORMAL HIGH (ref 70–99)

## 2013-07-07 LAB — PROTIME-INR
INR: 5.66 — AB (ref 0.00–1.49)
PROTHROMBIN TIME: 48.8 s — AB (ref 11.6–15.2)

## 2013-07-07 MED ORDER — SCOPOLAMINE 1 MG/3DAYS TD PT72
1.0000 | MEDICATED_PATCH | TRANSDERMAL | Status: DC
  Filled 2013-07-07: qty 1

## 2013-07-07 MED ORDER — HYDROMORPHONE HCL PF 1 MG/ML IJ SOLN
0.5000 mg | Freq: Four times a day (QID) | INTRAMUSCULAR | Status: DC
  Administered 2013-07-07 (×2): 0.5 mg via INTRAVENOUS
  Filled 2013-07-07: qty 1

## 2013-07-07 NOTE — Progress Notes (Signed)
Patient ID: Chris Vance, male   DOB: [birth year], 73 y.o.   MRN: [id number] Patient alert oriented and talkative this morning. Rescheduled palliative care meeting this afternoon. Patient without complaints this morning.

## 2013-07-07 NOTE — Progress Notes (Signed)
CSW soke with Dr. Nancy Nordmann and she feels that patient is not appropriate for SNFdue to medical complexities and IV medications. Erling Conte, liaison for [facility] has informed CSW that patient has a bed at [facility] in the AM. CSW will assist with all d/c needs.   Dr.Goulding has taken over the care of patient.   Rhea Pink, MSW, [place]

## 2013-07-07 NOTE — Progress Notes (Signed)
Pt refusing all medicines. Pt states, " I will probably be dead tomorrow so what does it matter". Pt displaying jerking-like tremors. Offered pt valium to help and again, pt refused. Pt denies needing pain medicine. Will contact chaplain services on patient's behalf to come and offer therapeutic presence and conversation if needed.  Will continue to monitor.  -Soyla Dryer, RN

## 2013-07-07 NOTE — Progress Notes (Signed)
Nurse requested chaplain for prayer for pt. Chaplin visited pt and family member. Pt was very distraught due to his deteriorating health and stated he did not want to live,but he felt that he was of no value to his family. Chaplain prayed for pt and family member.  Charyl Dancer, Chaplain  07/07/13 1600  Clinical Encounter Type  Visited With Patient and family together  Visit Type Initial  Referral From Nurse  Consult/Referral To Chaplain  Spiritual Encounters  Spiritual Needs Prayer;Emotional  Stress Factors  Patient Stress Factors Health changes;Loss;Loss of control;Major life changes  Family Stress Factors None identified

## 2013-07-07 NOTE — Progress Notes (Addendum)
Palliative Medicine Team Progress Note  1. End Stage Renal Disease, stopping HD (last HD 5 days ago)  On HD since 2009, resident at [facility] 2.  Severe Peripheral Vascular Disease, s/p bilateral BKAs, sacral decubitus 3. Failure to thrive, Maluntrition' 4. Encephalopathy, resolved, intermittently confused  Patient now 5 days since hemodialysis. Not a candidate for continued dialysis due to severe debility. He has worsening myoclonic jerks. He knows he is dying, he tells me that "it wont be long". He is intermittently confused and while some of his speech is coherent much of it is rambling and difficult to understand. He has severe pain with any movement- they tried to move him OOB yesterday and he simply could not tolerate changes in position. He has a dark eschar forming on his hip and has a sacral decubitus. This morning he asked for a plate of hotdogs-he at part of one hot dog only and was extremely happy and satisfied- he told me "the simple things make him happy".   I met with the patient, his son and his brother to discuss goals of care. Mr. Mangel is now clearly hospice appropriate.   Due to pain with minimal movement in bed and need for careful and thoughtful symptom management he is not appropriate or stable for discharge to a SNF-he would be a high risk for readmission this evening from [facility] and I do not think they would be able to give him IV push medication for comfort- he has required both IV valium and IV hydromorphone-I have asked nurses to premedicate prior to turning or moving.  He is very fragile and unstable, anticipate his death really at anytime, even though he has periods of alertness.  Hospice not available to do admission tonight [facility] it is too late in the afternoon to do a GIP transition and he has a bed at [facility] in the AM.   Plan for discharge to [facility] in AM- Appreciate the assistance of the hospice team, chaplain, RN and the CSW for  assisting with this discharge plan and giving this gentleman dignity at the end of his life. Will assume patient care this afternoon from Dr. Sharol Given and continue to work on treating his myoclonus and other EOL through the evening. Despite the delay in resolving his goals of care we have ultimately served this patient well and met his request to no longer undergo burdensome, costly and painful interventions that have and will do little to improve his quality of life or reverse his progressive illness.  Case discussed with Dr. Reynaldo Minium with Care Management and also with [facility] with social work regarding concerns over LOS.  Lane Hacker, DO Palliative Medicine

## 2013-07-07 NOTE — Consult Note (Signed)
[facility] ROUNDING NOTE   Subjective:   Interval History: Chris Vance is confused and intermittently lethargic. Unfortunately family had difficulty meeting with palliative care yesterday although situation is very grave.   Objective:  Vital signs in last 24 hours:  Temp:  [97.4 F (36.3 C)-98.3 F (36.8 C)] 98.3 F (36.8 C) (05/12 0553) Pulse Rate:  [72-81] 72 (05/12 0553) Resp:  [16-18] 16 (05/12 0553) BP: (132-135)/(60-65) 132/64 mmHg (05/12 0553) SpO2:  [94 %-96 %] 94 % (05/12 0553)  Weight change:  Filed Weights   07/01/13 0903 07/02/13 1018 07/02/13 1416  Weight: 63.957 kg (141 lb) 64 kg (141 lb 1.5 oz) 63.1 kg (139 lb 1.8 oz)    Intake/Output: I/O last 3 completed shifts: In: 480 [P.O.:480] Out: 0    Intake/Output this shift:    Confused  CVS- irregular atrial fibrillation RS- Diminished at bases ABD- BS present soft non-distended EXT- no edema   Basic Metabolic Panel:  Recent Labs Lab 07/01/13 0810 07/02/13 1102  NA 137 140  K 4.8 6.1*  CL  --  97  CO2  --  24  GLUCOSE 106* 86  BUN  --  68*  CREATININE  --  6.77*  CALCIUM  --  8.9  PHOS  --  6.6*    Liver Function Tests:  Recent Labs Lab 07/02/13 1102  ALBUMIN 2.2*   No results found for this basename: LIPASE, AMYLASE,  in the last 168 hours No results found for this basename: AMMONIA,  in the last 168 hours  CBC:  Recent Labs Lab 07/01/13 0810 07/02/13 1102 07/03/13 0605  WBC  --  12.3* 11.5*  HGB 13.9 9.9* 10.3*  HCT 41.0 31.8* 33.4*  MCV  --  94.4 95.7  PLT  --  278 256    Cardiac Enzymes: No results found for this basename: CKTOTAL, CKMB, CKMBINDEX, TROPONINI,  in the last 168 hours  BNP: No components found with this basename: POCBNP,   CBG:  Recent Labs Lab 07/05/13 1134 07/05/13 2157 07/06/13 0635 07/06/13 2115 07/07/13 0618  GLUCAP 124* 152* 118* 122* 121*    Microbiology: Results for orders placed during the hospital encounter of 05/12/13   CLOSTRIDIUM DIFFICILE BY PCR     Status: None   Collection Time    05/21/13  2:00 PM      Result Value Ref Range Status   C difficile by pcr NEGATIVE  NEGATIVE Final    Coagulation Studies:  Recent Labs  07/05/13 0355 07/06/13 0533 07/07/13 0435  LABPROT 40.8* 37.0* 48.8*  INR 4.47* 3.94* 5.66*    Urinalysis: No results found for this basename: COLORURINE, APPERANCEUR, LABSPEC, PHURINE, GLUCOSEU, HGBUR, BILIRUBINUR, KETONESUR, PROTEINUR, UROBILINOGEN, NITRITE, LEUKOCYTESUR,  in the last 72 hours    Imaging: No results found.   Medications:     . amiodarone  100 mg Oral BID  . calcium acetate  2,001 mg Oral TID WC  . feeding supplement (NEPRO CARB STEADY)  237 mL Oral TID WC  . gabapentin  300 mg Oral QHS  . mupirocin cream   Topical BID  . Warfarin - Pharmacist Dosing Inpatient   Does not apply q1800   sodium chloride, sodium chloride, acetaminophen, diazepam, feeding supplement (NEPRO CARB STEADY), heparin, HYDROmorphone (DILAUDID) injection, lidocaine (PF), lidocaine-prilocaine, ondansetron (ZOFRAN) IV, pentafluoroprop-tetrafluoroeth  Assessment/ Plan:   I appreciate Dr Delanna Ahmadi help, it appears that Chris Vance is unable to formally reach a decision about dialysis on his own. He is incoherent at times  although resting in no distress. He wakens to my voice and becomes agitated although incoherent.  Discussed with brother Chris Vance. I stated that I do not think that it would be appropriate to continue dialysis in view of patients overall demise over the last 4 months culminating in gangrene of left lower extremity and would recommend that we withdraw patient from dialysis. Chris Vance is receptive although he had hoped that his brother would rally. I emphasized that the goals should be on comfort care and pain control at this time.  Discussed overall condition with nurse, Roselyn Reef. She states that he has made a request for hot dogs and mustard. He also has some wounds sacrum and skin  tears. He continues to refuse medications except pain meds  I doubt that chronic outpatient dialysis will ever be an option again. I have attempted to contact son and have been unable. I will be happy to assist Dr Hilma Favors as she discusses these issues with the family.   LOS: [place] @TODAY @10 :22 AM

## 2013-07-07 NOTE — Progress Notes (Signed)
Lab called to report critical INR of 5.66.  Dr Sharol Given advised. No new orders.

## 2013-07-07 NOTE — Discharge Summary (Signed)
  Patient was originally scheduled to be discharged back to SNF, due to his declining medical condition, discharge to SNF was delayed and family wished to pursue Hospice care, Nephrology recommended not to continue dialysis.  Several planning meeting were missed by the family and discharge was delayed.    Plan for discharge to SNF or hospice today.    Condition guarded.   Final diagnosis failure to thrive due to diabetes, end stage renal disease, peripheral vascular disease with gangrene of the lower extremities.

## 2013-07-07 NOTE — Progress Notes (Signed)
Pt refused morning medicines and breakfast. Pt stated all he wanted was, "4 hot dogs, a side of slaw, onions, and mustard". Per Dr. Sharol Given patient is to have this meal. Judeen Hammans, dietary supervisor notified. A tray with this meal is being prepared and will be up to the floor as soon as possible.   Will continue to monitor.  - Soyla Dryer, RN

## 2013-07-07 NOTE — Progress Notes (Signed)
ANTICOAGULATION CONSULT NOTE - Follow Up Consult  Pharmacy Consult for Coumadin Indication: atrial fibrillation  Allergies  Allergen Reactions  . Ace Inhibitors Other (See Comments)    Unknown   . Lisinopril Cough    Patient Measurements: Height: 5\' 10"  (177.8 cm) Weight: 139 lb 1.8 oz (63.1 kg) IBW/kg (Calculated) : 73  Vital Signs: Temp: 98.3 F (36.8 C) (05/12 0553) BP: 132/64 mmHg (05/12 0553) Pulse Rate: 72 (05/12 0553)  Labs:  Recent Labs  07/05/13 0355 07/06/13 0533 07/07/13 0435  LABPROT 40.8* 37.0* 48.8*  INR 4.47* 3.94* 5.66*   Assessment: 59 yom continues on warfarin for afib. INR remains elevated and even increasing up to 5.66. Patient has not received any coumadin since 5/8. No new CBC today, no overt bleeding noted.   Goal of Therapy:  INR 2-3   Plan:  1. No coumadin tonight 2. F/u AM INR  3. F/u care plan  Salome Arnt, PharmD, BCPS Pager # [phone] 07/07/2013 8:14 AM

## 2013-07-08 DIAGNOSIS — Z515 Encounter for palliative care: Secondary | ICD-10-CM

## 2013-07-08 DIAGNOSIS — E290 Testicular hyperfunction: Secondary | ICD-10-CM

## 2013-07-08 DIAGNOSIS — I96 Gangrene, not elsewhere classified: Secondary | ICD-10-CM

## 2013-07-08 LAB — GLUCOSE, CAPILLARY: Glucose-Capillary: 122 mg/dL — ABNORMAL HIGH (ref 70–99)

## 2013-07-08 MED ORDER — DIAZEPAM 5 MG/ML IJ SOLN: 2.5000 mg | INTRAMUSCULAR | Status: AC | PRN

## 2013-07-08 MED ORDER — HYDROMORPHONE HCL PF 1 MG/ML IJ SOLN: 0.5000 mg | Freq: Four times a day (QID) | INTRAMUSCULAR | Status: AC

## 2013-07-08 MED ORDER — SCOPOLAMINE 1 MG/3DAYS TD PT72: 1.0000 | MEDICATED_PATCH | TRANSDERMAL | Status: AC

## 2013-07-08 MED ORDER — HYDROMORPHONE HCL PF 1 MG/ML IJ SOLN: 0.5000 mg | INTRAMUSCULAR | Status: AC | PRN

## 2013-07-08 MED ORDER — ONDANSETRON HCL 4 MG/2ML IJ SOLN: 4.0000 mg | Freq: Four times a day (QID) | INTRAMUSCULAR | Status: AC | PRN

## 2013-07-09 NOTE — Progress Notes (Signed)
Clinical social worker assisted with patient discharge to skilled nursing facility,[facility].  CSW addressed all family questions and concerns. CSW copied chart and added all important documents. CSW also set up patient transportation with [employer]omatic Services operational officer. Clinical Social Worker will sign off for now as social work intervention is no longer needed.   Rhea Pink, MSW, [place]

## 2013-07-16 ENCOUNTER — Telehealth: Payer: Self-pay

## 2013-07-16 NOTE — Telephone Encounter (Signed)
Patient past away per Email from [facility] and not much of an [place]

## 2013-07-27 NOTE — Consult Note (Signed)
[facility] Liaison: (late entry from 07/07/2013 PM) Received request from [place] for family interest in [facility]. Chart reviewed, received report from PMT MD prior to 3:30 meeting and after meeting. Met with patient's son and brother to confirm interest and complete transfer paperwork. Dr. Orpah Melter to assume care per family request. Room is available today 08-05-13. Son has requested to be contacted when ambulance called which I will do. Discharge summary has been faxed. Please have RN call report to [phone]. Please arrange transport for Chris Vance to arrive at [facility] before noon. CSW aware. Thank you. Erling Conte, [place]

## 2013-07-27 NOTE — Progress Notes (Signed)
Patient was more alert this morning, could answer questions.  Although, he was experiencing what looked like spasms throughout his extremities.  Patient also stated he hurts when he is moved.  PRN Dilaudid and Valium IV was administered before transport to [facility] for hospice care.  Will report to receiving RN at hospice care.

## 2013-07-27 NOTE — Discharge Summary (Signed)
Physician Discharge Summary  Chris Vance:[id number] DOB: Oct 21, 1940 DOA: 72/07/2013  PCP: Tamsen Roers, MD  Admit date: 07/01/2013 Discharge date: 2013/07/19  Time spent: 60 minutes  Recommendations for Outpatient Follow-up:   Patient is being discharged to [facility] for EOL care and symptom mangement   Discharge Diagnoses:  Principal Problem:   End stage renal disease Active Problems:   Anxiety   Peripheral vascular disease   Anemia of chronic renal failure   Atrial fibrillation   Diastolic CHF, chronic   Gangrene associated with diabetes mellitus   DNR (do not resuscitate)   Discharge Condition: guarded, poor    Diet recommendation: Comfort feeding as tolerated, no limitations  Filed Weights   07/01/13 0903 07/02/13 1018 07/02/13 1416  Weight: 63.957 kg (141 lb) 64 kg (141 lb 1.5 oz) 63.1 kg (139 lb 1.8 oz)    History of present illness:  73 yo man with ESRD HD dependent, bilateral amputee, 4 admissions in last 6 months and multiple ED visits related to falls from his wheelchair from [facility]. He is now post op from left LE gangrene with transtibial amputation. He is confused, not eating or taking medications and more difficulty tolerating HD. PMT consulted for goals of care discussion.   1. End Stage Renal Disease, stopping HD (last HD 5 days ago)  On HD since 2009, resident at [facility] 2. Severe Peripheral Vascular Disease, s/p bilateral BKAs, sacral decubitus - left BKA this admission for gangrene 3. Failure to thrive, Maluntrition'  4. Encephalopathy, resolved, intermittently confused  Hospital Course:  Admitted on 5/6 for gangrene and had amputation. Post amputation he had difficulty with encephalopathy and tolerating hemodialysis. Patient had had a marked decline in the past few months - he was discharged on 5/8 but had episode of confusion and expressed a wish to stop HD.PMT was consulted and due to both family and provider  availability factors and weekend issues-was unable to have a complete goals of care discussion until 5/13. After meeting with family and discussing case with Nephrology- HD has been stopped and [facility] will transition into Hospice Care.  Prognosis: days <1 week  Consultations:  Orthopedics primary service  Discharge Exam: Filed Vitals:   07/07/13 2025  BP: 130/90  Pulse: 129  Temp: 100.4 F (38 C)  Resp: 14   Frail, very weak, moderately severe myoclonus Scattered Rhonchi Bruised extremities and multiple skin tears Intermittently alert        Discharge Orders   Future Orders Complete By Expires   Diet general  As directed    Discharge wound care:  As directed        Medication List    STOP taking these medications       acetaminophen 325 MG tablet  Commonly known as:  TYLENOL     amiodarone 100 MG tablet  Commonly known as:  PACERONE     calcium acetate 667 MG capsule  Commonly known as:  PHOSLO     doxycycline 100 MG capsule  Commonly known as:  VIBRAMYCIN     escitalopram 10 MG tablet  Commonly known as:  LEXAPRO     feeding supplement (PRO-STAT SUGAR FREE 64) Liqd     gabapentin 300 MG capsule  Commonly known as:  NEURONTIN     insulin lispro 100 UNIT/ML injection  Commonly known as:  HUMALOG     INVANZ 1 G injection  Generic drug:  ertapenem     levothyroxine 25 MCG tablet  Commonly known as:  SYNTHROID, LEVOTHROID     lidocaine-prilocaine cream  Commonly known as:  EMLA     loperamide 2 MG tablet  Commonly known as:  IMODIUM A-D     metoprolol 100 MG tablet  Commonly known as:  LOPRESSOR     mirtazapine 15 MG tablet  Commonly known as:  REMERON     oxyCODONE-acetaminophen 5-325 MG per tablet  Commonly known as:  PERCOCET/ROXICET     silver sulfADIAZINE 1 % cream  Commonly known as:  SILVADENE     warfarin 1 MG tablet  Commonly known as:  COUMADIN     warfarin 2.5 MG tablet  Commonly known as:  COUMADIN      TAKE these  medications       collagenase ointment  Commonly known as:  SANTYL  Apply 1 application topically daily.     diazepam 5 MG/ML injection  Commonly known as:  VALIUM  Inject 0.5 mLs (2.5 mg total) into the vein every 4 (four) hours as needed.     HYDROmorphone 1 MG/ML Soln injection  Commonly known as:  DILAUDID  Inject 0.5 mLs (0.5 mg total) into the vein every 6 (six) hours.     HYDROmorphone 1 MG/ML Soln injection  Commonly known as:  DILAUDID  Inject 0.5-1 mLs (0.5-1 mg total) into the vein every 2 (two) hours as needed for moderate pain or severe pain (Dyspnea).     ondansetron 4 MG/2ML Soln injection  Commonly known as:  ZOFRAN  Inject 2 mLs (4 mg total) into the vein every 6 (six) hours as needed for nausea.     scopolamine 1 MG/3DAYS  Commonly known as:  TRANSDERM-SCOP  Place 1 patch (1.5 mg total) onto the skin every 3 (three) days.       Allergies  Allergen Reactions  . Ace Inhibitors Other (See Comments)    Unknown   . Lisinopril Cough   Follow-up Information   Follow up with DUDA,MARCUS V, MD In 2 weeks.   Specialty:  Orthopedic Surgery   Contact information:   [place] [id number] [phone]        The results of significant diagnostics from this hospitalization (including imaging, microbiology, ancillary and laboratory) are listed below for reference.    Significant Diagnostic Studies: Dg Chest 2 View  07/01/2013   CLINICAL DATA:  Preoperative chest x-ray for left lower extremity gangrene and below-knee amputation.  EXAM: CHEST - 2 VIEW  COMPARISON:  05/27/2013  FINDINGS: Stable moderate cardiomegaly. Chronic pulmonary venous hypertensive changes are present without overt airspace edema or pleural fluid. No infiltrates are seen.  IMPRESSION: Stable cardiomegaly and chronic pulmonary venous hypertension without overt edema.   Electronically Signed   By: Aletta Edouard M.D.   On: 07/01/2013 08:28    Microbiology: No results found  for this or any previous visit (from the past 240 hour(s)).   Labs: Basic Metabolic Panel:  Recent Labs Lab 07/01/13 0810 07/02/13 1102  NA 137 140  K 4.8 6.1*  CL  --  97  CO2  --  24  GLUCOSE 106* 86  BUN  --  68*  CREATININE  --  6.77*  CALCIUM  --  8.9  PHOS  --  6.6*   Liver Function Tests:  Recent Labs Lab 07/02/13 1102  ALBUMIN 2.2*   No results found for this basename: LIPASE, AMYLASE,  in the last 168 hours No results found for this basename: AMMONIA,  in the last 168 hours CBC:  Recent Labs Lab 07/01/13 0810 07/02/13 1102 07/03/13 0605  WBC  --  12.3* 11.5*  HGB 13.9 9.9* 10.3*  HCT 41.0 31.8* 33.4*  MCV  --  94.4 95.7  PLT  --  278 256   Cardiac Enzymes: No results found for this basename: CKTOTAL, CKMB, CKMBINDEX, TROPONINI,  in the last 168 hours BNP: BNP (last 3 results)  Recent Labs  05/12/13 1311  PROBNP >70000.0*   CBG:  Recent Labs Lab 07/05/13 2157 07/06/13 0635 07/06/13 2115 07/07/13 0618 07/07/13 2113  GLUCAP 152* 118* 122* 121* 133*       Signed:  Acquanetta Chain  [facility] 07-23-13, 1:25 AM

## 2013-07-27 DEATH — deceased

## 2014-02-04 ENCOUNTER — Encounter (HOSPITAL_COMMUNITY): Payer: Self-pay | Admitting: Vascular Surgery

## 2014-06-12 IMAGING — CR DG FOOT COMPLETE 3+V*L*
3 series · 3 of 3 positions shown · non-contrast
Comparison: MR FOOT*L* W/O CM dated 02/01/2013

CLINICAL DATA: Ulcer lateral fifth metacarpophalangeal joint

EXAM:
LEFT FOOT - COMPLETE 3+ VIEW

[x foot ap left]
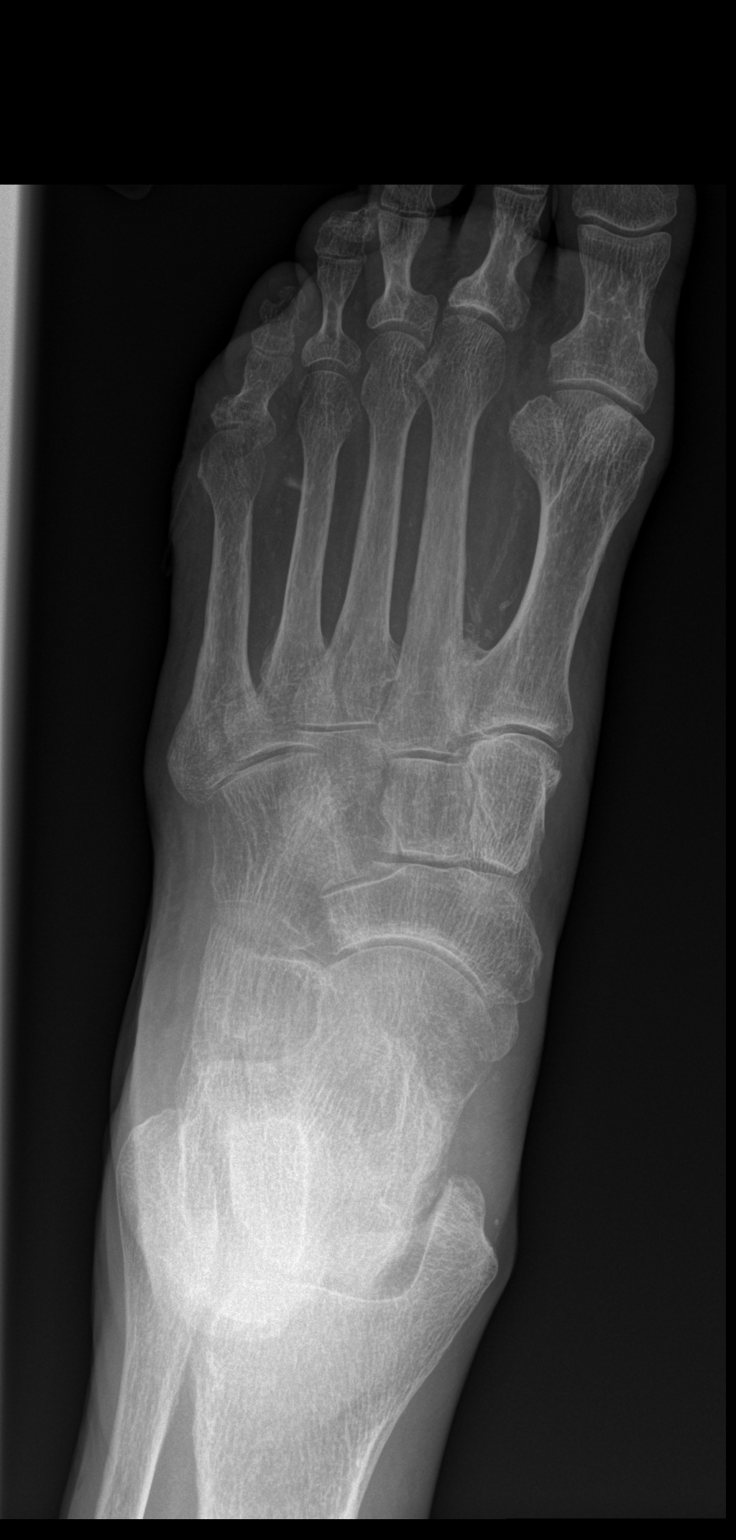

[x foot obl left]
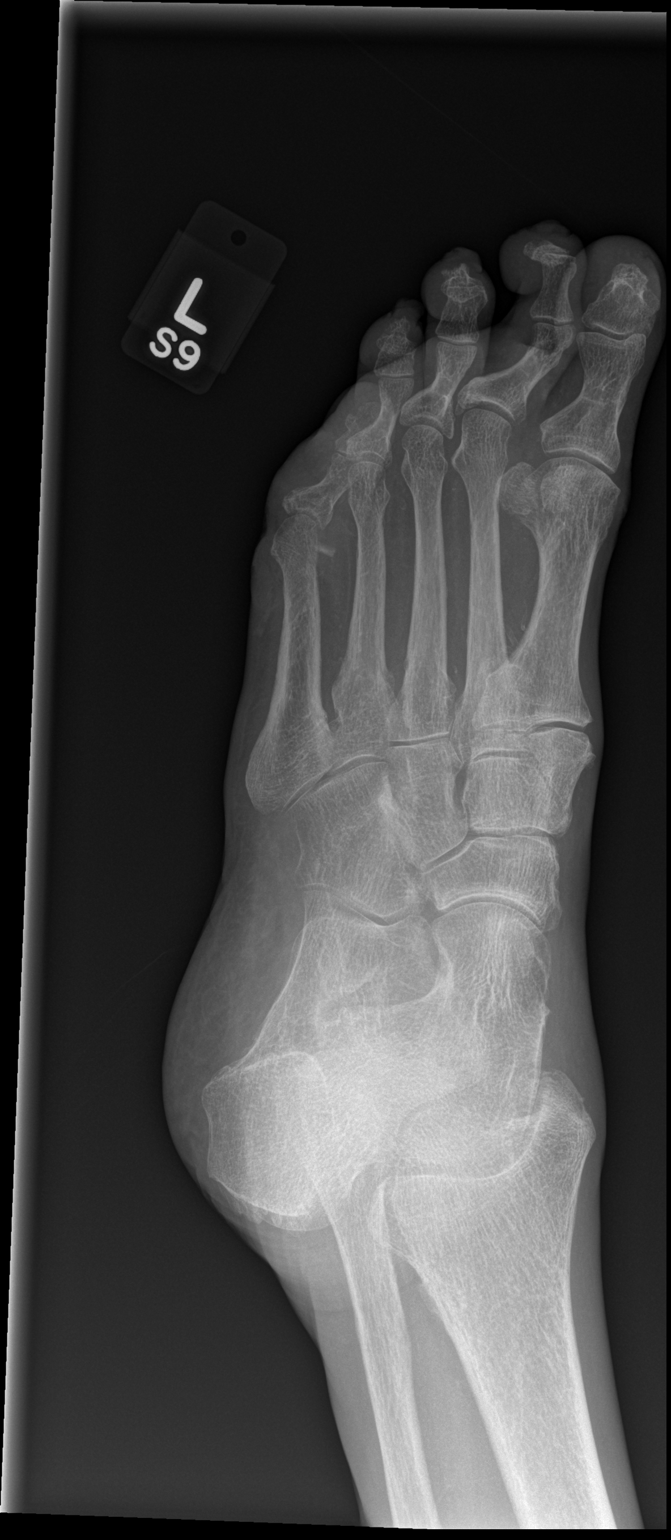

[x foot lat left]
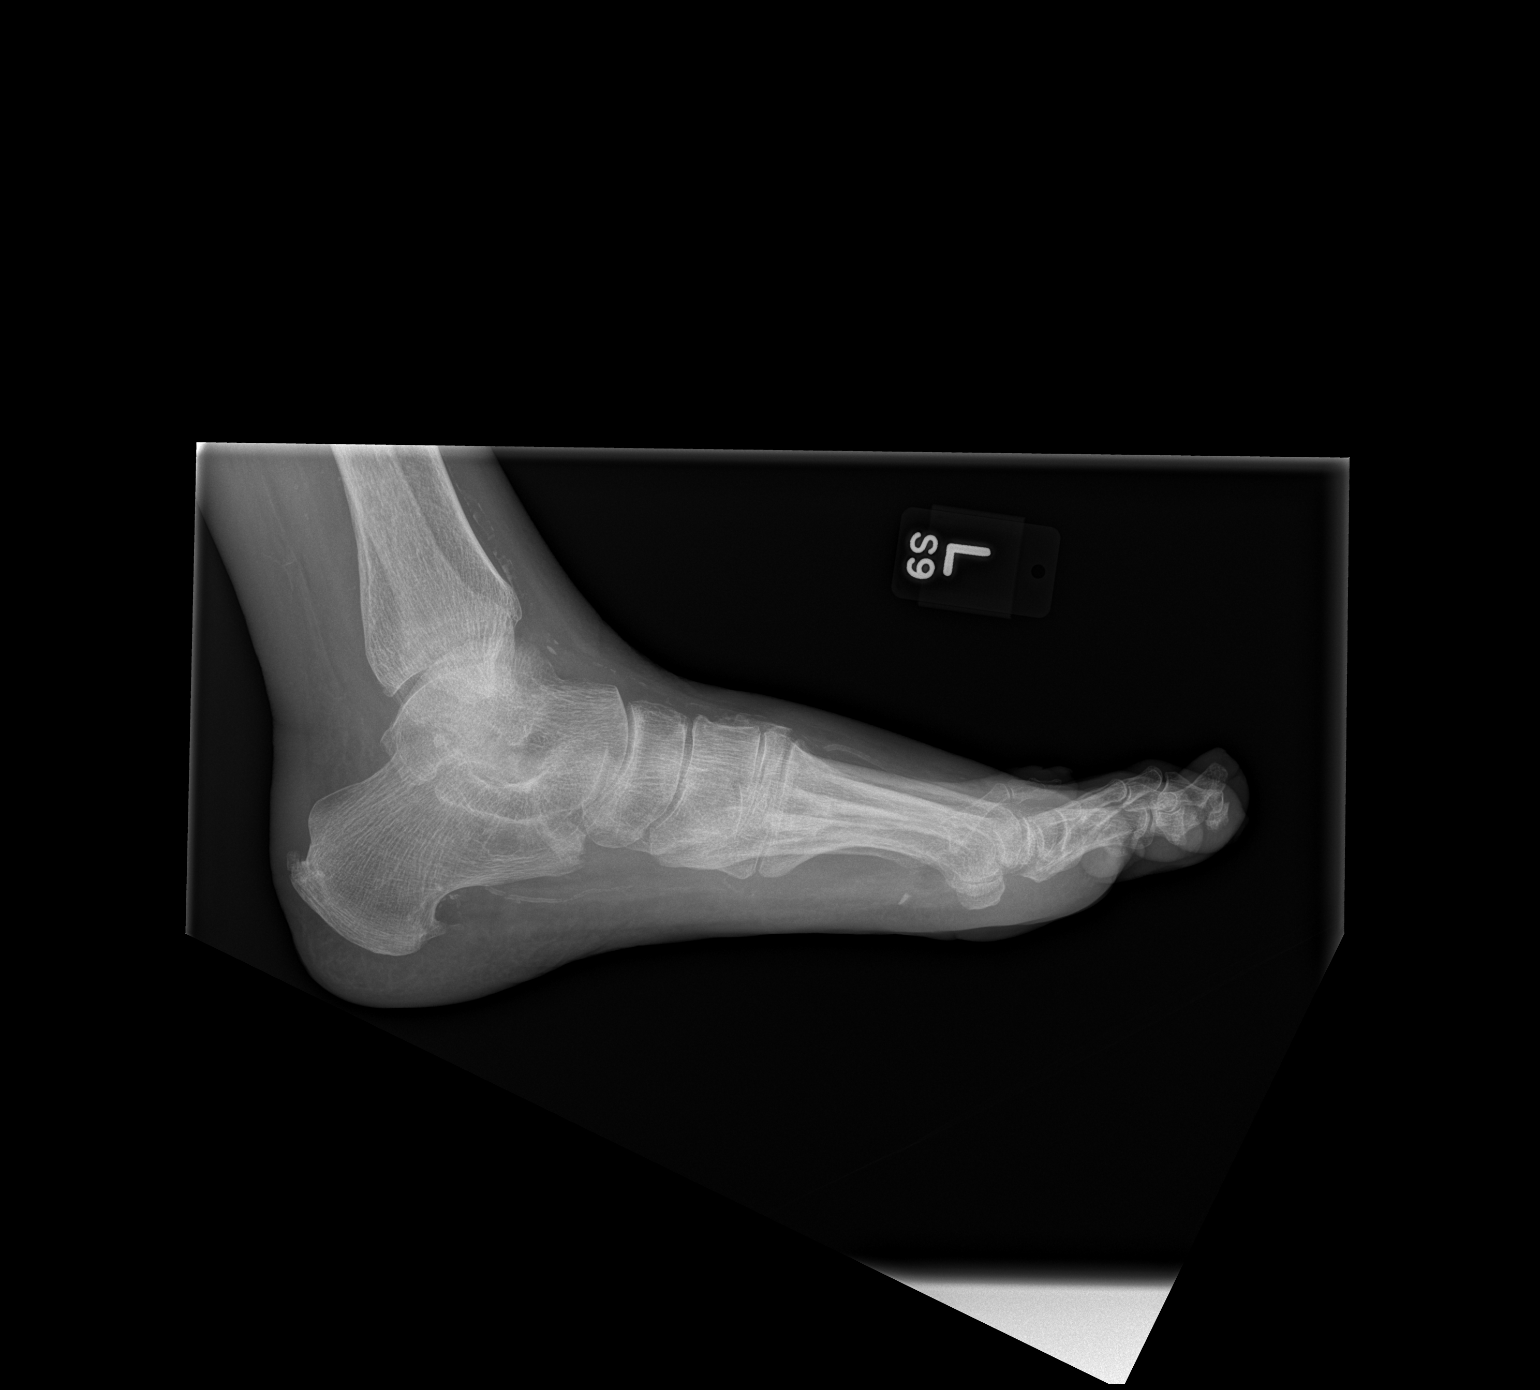

[3 of 3 positions shown; findings below may reference images not displayed]

FINDINGS: Diffuse small vessel calcification. 4 mm foreign body along the sole
underlying the distal fourth metacarpal. Soft tissue defect
laterally over the fifth metacarpophalangeal joint. No periosteal
reaction or cortical destruction. No fracture or dislocation.
IMPRESSION: No acute osseous abnormalities. Findings consistent with soft tissue
ulcer with no definite evidence of osteomyelitis.

A foreign body is identified as described above.

## 2014-06-20 IMAGING — CR DG CHEST 1V PORT
1 series · 1 of 1 positions shown · non-contrast
Comparison: DG CHEST 2 VIEW dated 05/12/2013; DG CHEST 1V PORT dated
04/20/2013; DG RIBS BILATERAL W/CHEST dated 01/30/2013

CLINICAL DATA: Altered mental status, elevated white blood cell
count, evaluate for pneumonia

EXAM:
PORTABLE CHEST - 1 VIEW

[AP]
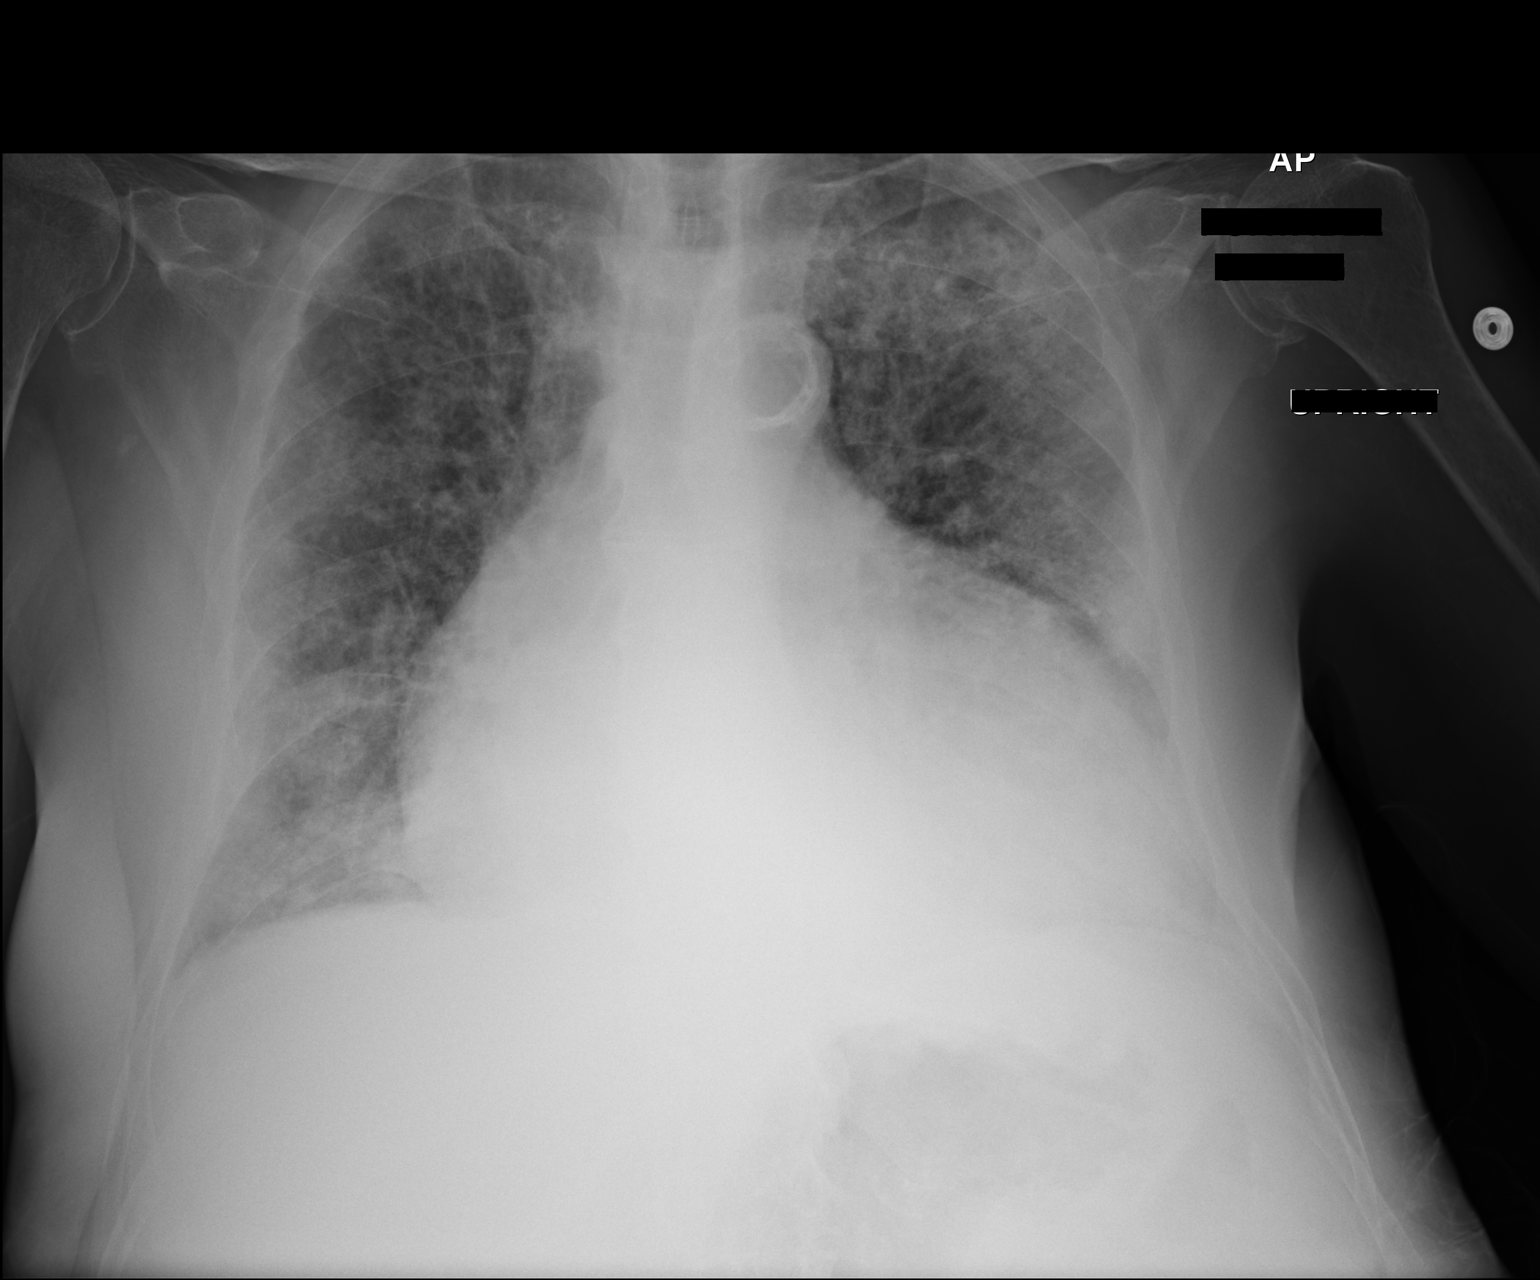

[1 of 1 positions shown; findings below may reference images not displayed]

FINDINGS: Grossly unchanged enlarged cardiac silhouette and mediastinal
contours with atherosclerotic plaque within the thoracic aorta. The
pulmonary vasculature remains indistinct with cephalization of flow.
Worsening bilateral mid and lower lung heterogeneous opacities, left
greater than right. No definite pleural effusion or pneumothorax.
Grossly unchanged bones.
IMPRESSION: Overall findings most suggestive of worsening asymmetry pulmonary
edema though note, underlying infection is not excluded. Further
evaluation with a PA and lateral chest radiograph may be obtained as
clinically indicated.

## 2014-06-27 IMAGING — CR DG CHEST 1V
2 series · 2 of 2 positions shown · non-contrast
Comparison: May 20, 2013

CLINICAL DATA: Trauma

EXAM:
CHEST - 1 VIEW

[t chest supine (1 of 2)]
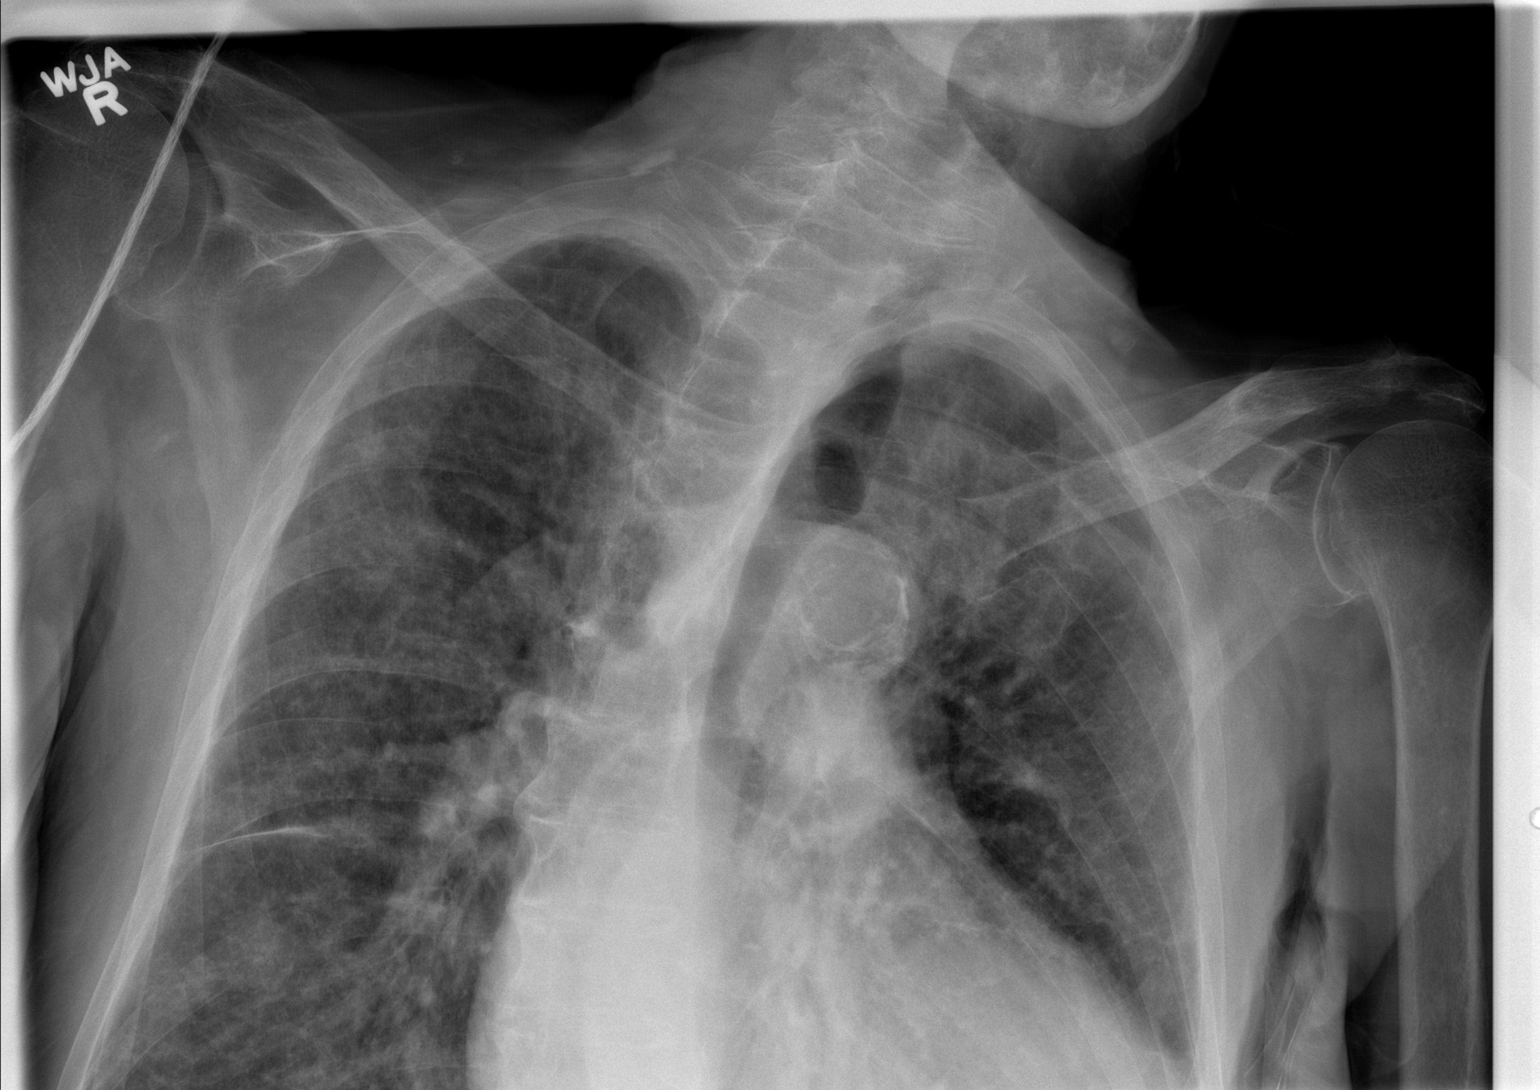

[t chest supine (2 of 2)]
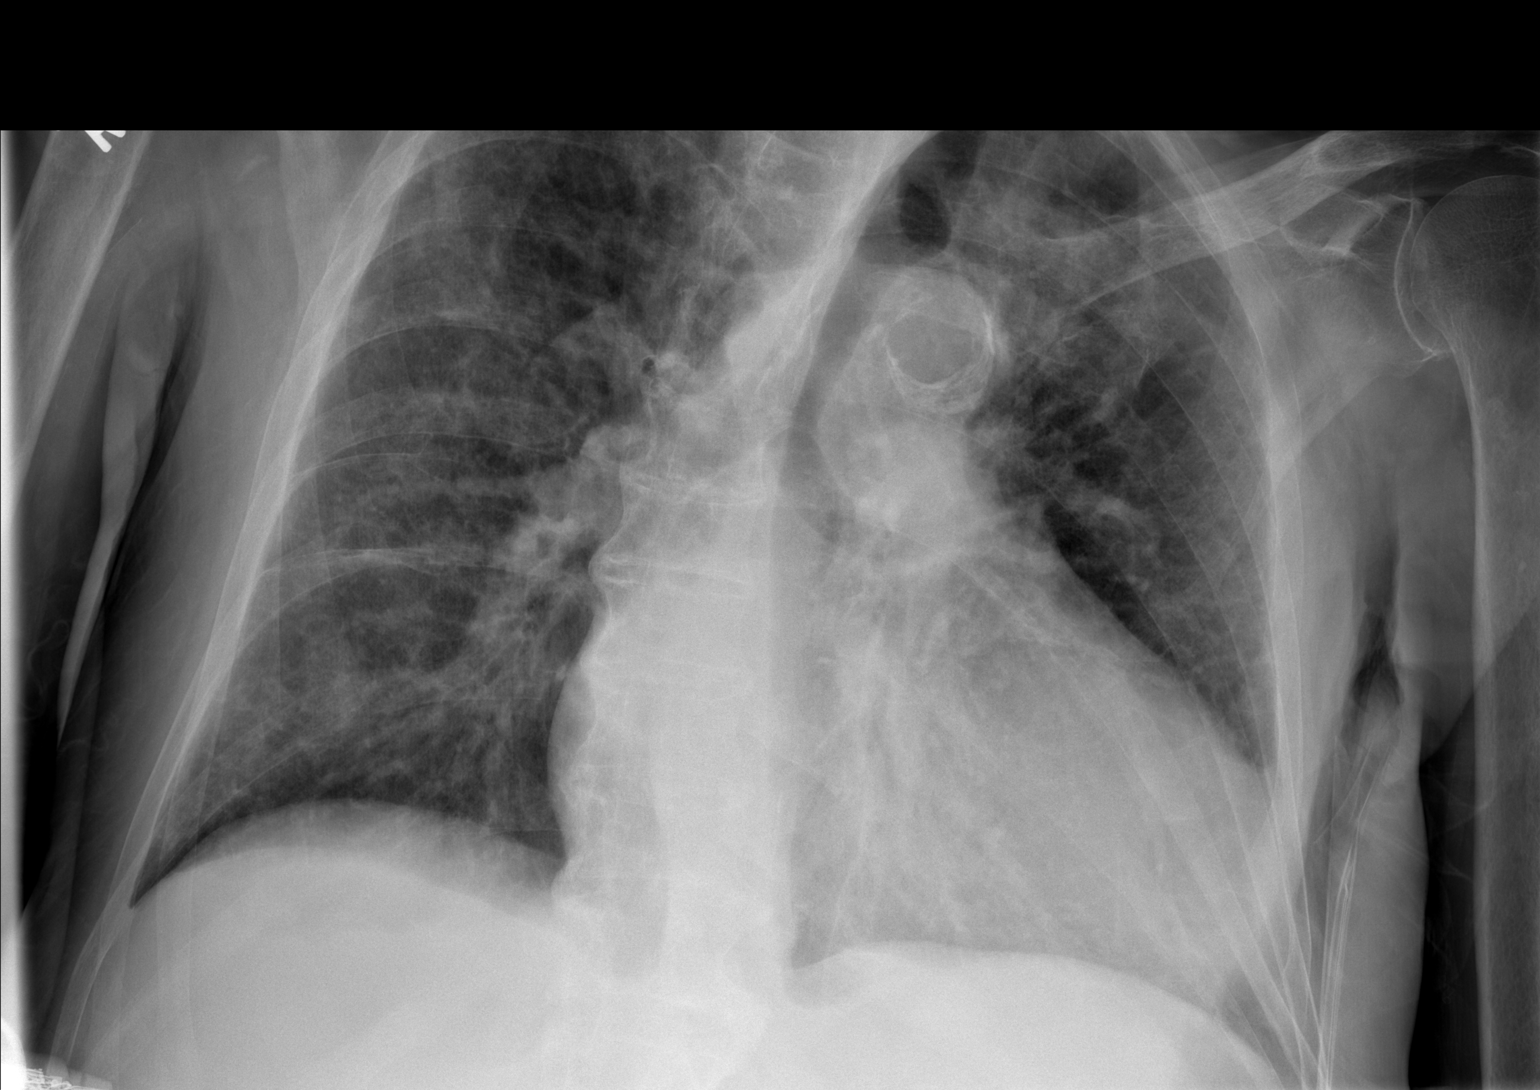

[2 of 2 positions shown; findings below may reference images not displayed]

FINDINGS: Patient is markedly rotated. There has been partial but incomplete
clearing of interstitial edema. There is no airspace consolidation.
Heart is enlarged with pulmonary vascularity within normal limits.
There is atherosclerotic change in aorta. No adenopathy. There is
degenerative change in the thoracic spine.
IMPRESSION: There is less edema compared to recent prior study. Mild pulmonary
edema remains. Cardiomegaly is present. There remains a degree of
congestive heart failure with partial resolution radiographically
compared to recent prior study. No new opacity.

## 2014-08-01 IMAGING — CR DG CHEST 2V
1 series · 1 of 1 positions shown · non-contrast
Comparison: 05/27/2013

CLINICAL DATA: Preoperative chest x-ray for left lower extremity
gangrene and below-knee amputation.

EXAM:
CHEST - 2 VIEW

[x chest ap]
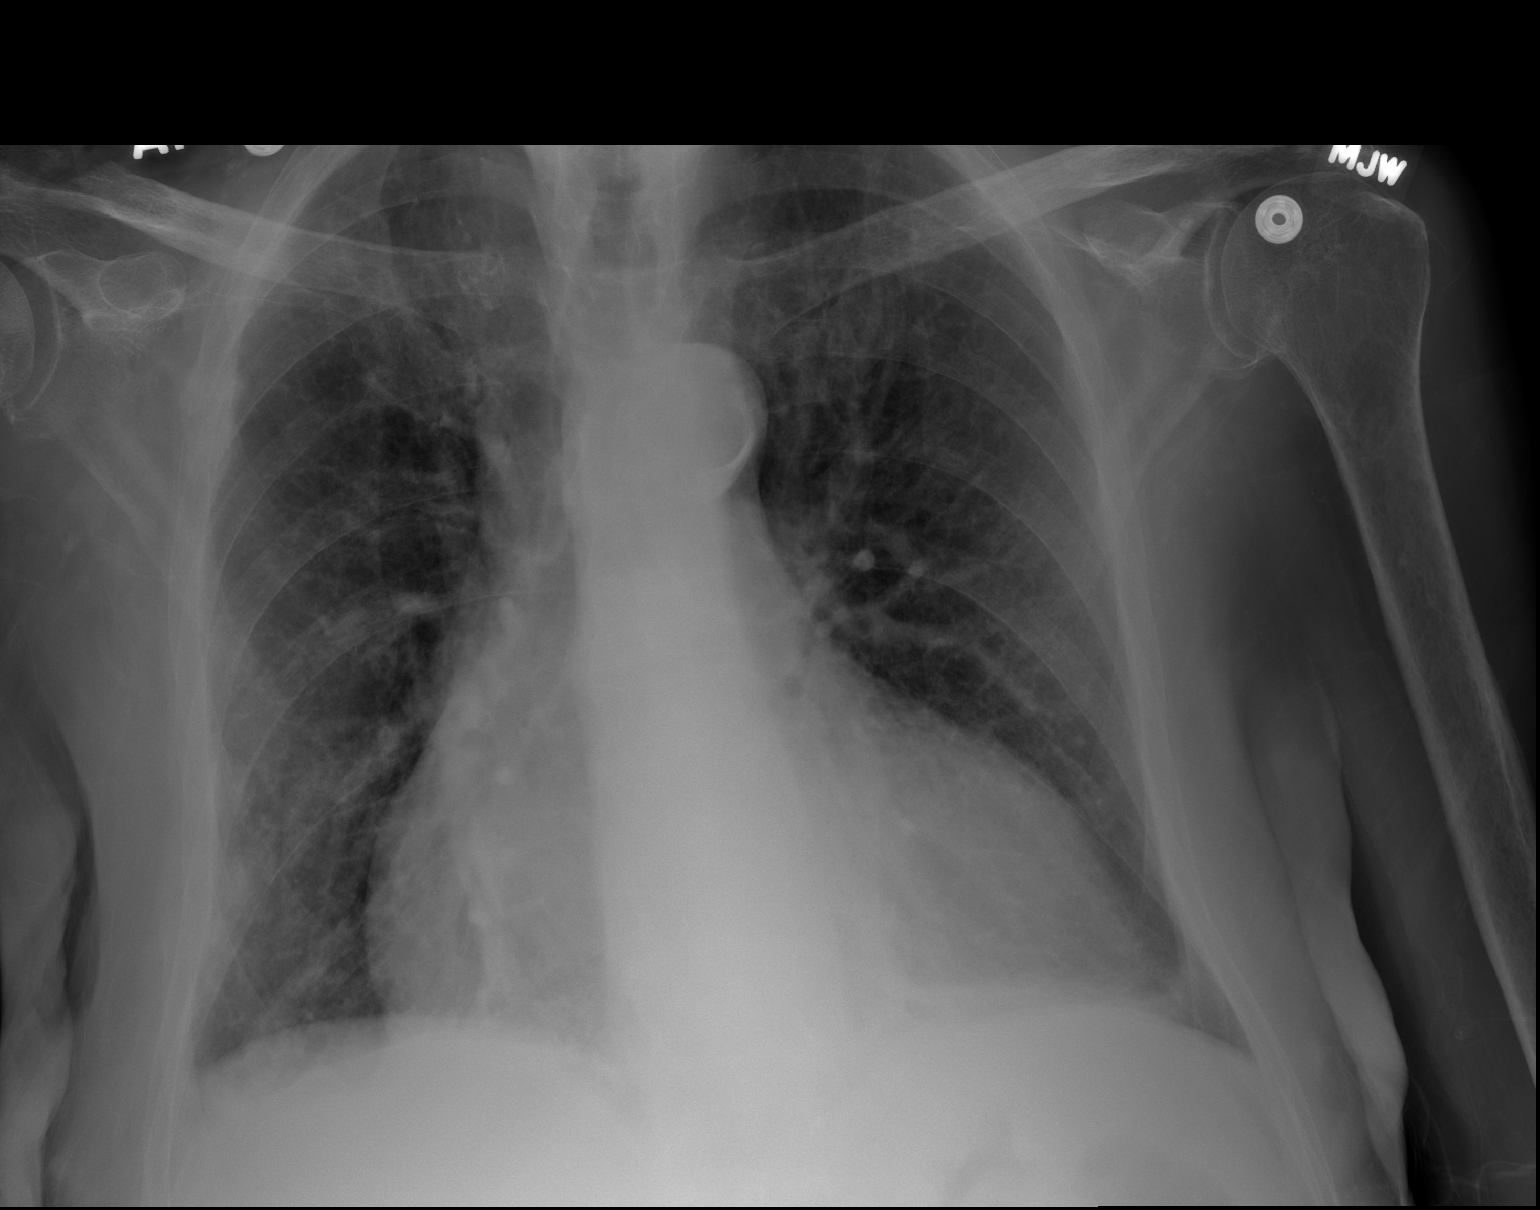

[1 of 1 positions shown; findings below may reference images not displayed]

FINDINGS: Stable moderate cardiomegaly. Chronic pulmonary venous hypertensive
changes are present without overt airspace edema or pleural fluid.
No infiltrates are seen.
IMPRESSION: Stable cardiomegaly and chronic pulmonary venous hypertension
without overt edema.
